# Patient Record
Sex: Male | Born: 1955 | Race: Black or African American | Hispanic: No | Marital: Married | Smoking: Never smoker
Health system: Southern US, Community
[De-identification: ages and names within clinical notes are randomized; demographics above are authoritative.]

## PROBLEM LIST (undated history)

## (undated) DIAGNOSIS — I714 Abdominal aortic aneurysm, without rupture, unspecified: Secondary | ICD-10-CM

## (undated) DIAGNOSIS — A419 Sepsis, unspecified organism: Secondary | ICD-10-CM

## (undated) DIAGNOSIS — Z9229 Personal history of other drug therapy: Secondary | ICD-10-CM

## (undated) DIAGNOSIS — I639 Cerebral infarction, unspecified: Secondary | ICD-10-CM

## (undated) DIAGNOSIS — I10 Essential (primary) hypertension: Secondary | ICD-10-CM

## (undated) DIAGNOSIS — E785 Hyperlipidemia, unspecified: Secondary | ICD-10-CM

## (undated) DIAGNOSIS — I6529 Occlusion and stenosis of unspecified carotid artery: Secondary | ICD-10-CM

## (undated) DIAGNOSIS — H10029 Other mucopurulent conjunctivitis, unspecified eye: Secondary | ICD-10-CM

## (undated) DIAGNOSIS — K56609 Unspecified intestinal obstruction, unspecified as to partial versus complete obstruction: Secondary | ICD-10-CM

## (undated) DIAGNOSIS — E039 Hypothyroidism, unspecified: Secondary | ICD-10-CM

## (undated) DIAGNOSIS — K4090 Unilateral inguinal hernia, without obstruction or gangrene, not specified as recurrent: Secondary | ICD-10-CM

## (undated) DIAGNOSIS — I63512 Cerebral infarction due to unspecified occlusion or stenosis of left middle cerebral artery: Secondary | ICD-10-CM

## (undated) DIAGNOSIS — N189 Chronic kidney disease, unspecified: Secondary | ICD-10-CM

## (undated) HISTORY — DX: Essential (primary) hypertension: I10

## (undated) HISTORY — DX: Unilateral inguinal hernia, without obstruction or gangrene, not specified as recurrent: K40.90

## (undated) HISTORY — DX: Hyperlipidemia, unspecified: E78.5

## (undated) HISTORY — PX: KNEE ARTHROSCOPY: SUR90

## (undated) HISTORY — DX: Hypothyroidism, unspecified: E03.9

## (undated) HISTORY — DX: Cerebral infarction, unspecified: I63.9

---
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
## 2013-12-24 ENCOUNTER — Ambulatory Visit: Payer: Self-pay | Admitting: Neurology

## 2013-12-24 ENCOUNTER — Inpatient Hospital Stay: Payer: Self-pay | Admitting: Specialist

## 2013-12-24 DIAGNOSIS — I639 Cerebral infarction, unspecified: Secondary | ICD-10-CM

## 2013-12-24 HISTORY — DX: Cerebral infarction, unspecified: I63.9

## 2013-12-24 LAB — CBC WITH DIFFERENTIAL/PLATELET
Basophil #: 0.1 10*3/uL (ref 0.0–0.1)
Basophil %: 1 %
Eosinophil #: 0.2 10*3/uL (ref 0.0–0.7)
Eosinophil %: 2.8 %
HCT: 46.9 % (ref 40.0–52.0)
HGB: 15.3 g/dL (ref 13.0–18.0)
LYMPHS ABS: 2 10*3/uL (ref 1.0–3.6)
Lymphocyte %: 32.4 %
MCH: 28.1 pg (ref 26.0–34.0)
MCHC: 32.7 g/dL (ref 32.0–36.0)
MCV: 86 fL (ref 80–100)
MONOS PCT: 10.9 %
Monocyte #: 0.7 x10 3/mm (ref 0.2–1.0)
NEUTROS PCT: 52.9 %
Neutrophil #: 3.3 10*3/uL (ref 1.4–6.5)
PLATELETS: 258 10*3/uL (ref 150–440)
RBC: 5.47 10*6/uL (ref 4.40–5.90)
RDW: 13.7 % (ref 11.5–14.5)
WBC: 6.2 10*3/uL (ref 3.8–10.6)

## 2013-12-24 LAB — COMPREHENSIVE METABOLIC PANEL
AST: 33 U/L (ref 15–37)
Albumin: 4.1 g/dL (ref 3.4–5.0)
Alkaline Phosphatase: 77 U/L
Anion Gap: 8 (ref 7–16)
BILIRUBIN TOTAL: 0.5 mg/dL (ref 0.2–1.0)
BUN: 17 mg/dL (ref 7–18)
CALCIUM: 8.9 mg/dL (ref 8.5–10.1)
Chloride: 104 mmol/L (ref 98–107)
Co2: 29 mmol/L (ref 21–32)
Creatinine: 1.25 mg/dL (ref 0.60–1.30)
EGFR (African American): >60
EGFR (Non-African Amer.): >60
Glucose: 127 mg/dL — ABNORMAL HIGH (ref 65–99)
Osmolality: 284 (ref 275–301)
POTASSIUM: 3.2 mmol/L — AB (ref 3.5–5.1)
SGPT (ALT): 39 U/L
SODIUM: 141 mmol/L (ref 136–145)
Total Protein: 8.2 g/dL (ref 6.4–8.2)

## 2013-12-24 LAB — APTT: Activated PTT: 32.7 secs (ref 23.6–35.9)

## 2013-12-24 LAB — PROTIME-INR
INR: 1
Prothrombin Time: 13.5 secs (ref 11.5–14.7)

## 2013-12-24 LAB — TROPONIN I: Troponin-I: <0.02 ng/mL

## 2013-12-25 LAB — BASIC METABOLIC PANEL WITH GFR
Anion Gap: 10
BUN: 14 mg/dL
Calcium, Total: 8.6 mg/dL
Chloride: 103 mmol/L
Co2: 26 mmol/L
Creatinine: 1.16 mg/dL
EGFR (African American): >60
EGFR (Non-African Amer.): >60
Glucose: 114 mg/dL — ABNORMAL HIGH
Osmolality: 279
Potassium: 3.3 mmol/L — ABNORMAL LOW
Sodium: 139 mmol/L

## 2013-12-25 LAB — LIPID PANEL
Cholesterol: 208 mg/dL — ABNORMAL HIGH (ref 0–200)
HDL Cholesterol: 49 mg/dL (ref 40–60)
Ldl Cholesterol, Calc: 147 mg/dL — ABNORMAL HIGH (ref 0–100)
Triglycerides: 59 mg/dL (ref 0–200)
VLDL Cholesterol, Calc: 12 mg/dL (ref 5–40)

## 2013-12-25 LAB — URINALYSIS, COMPLETE
Bacteria: NONE SEEN
Bilirubin,UR: NEGATIVE
Blood: NEGATIVE
Glucose,UR: NEGATIVE mg/dL
Ketone: NEGATIVE
Leukocyte Esterase: NEGATIVE
Nitrite: NEGATIVE
Ph: 5
Protein: NEGATIVE
RBC,UR: NONE SEEN /HPF
Specific Gravity: 1.013
Squamous Epithelial: <1
WBC UR: 2 /HPF

## 2013-12-25 LAB — DRUG SCREEN, URINE
Amphetamines, Ur Screen: NEGATIVE
Barbiturates, Ur Screen: NEGATIVE
Benzodiazepine, Ur Scrn: NEGATIVE
Cannabinoid 50 Ng, Ur ~~LOC~~: NEGATIVE
Cocaine Metabolite,Ur ~~LOC~~: NEGATIVE
MDMA (Ecstasy)Ur Screen: NEGATIVE
Methadone, Ur Screen: NEGATIVE
Opiate, Ur Screen: NEGATIVE
Phencyclidine (PCP) Ur S: NEGATIVE
Tricyclic, Ur Screen: NEGATIVE

## 2013-12-25 LAB — TSH: Thyroid Stimulating Horm: 2.54 u[IU]/mL

## 2013-12-25 LAB — HEMOGLOBIN A1C: Hemoglobin A1C: 6.1 % (ref 4.2–6.3)

## 2013-12-26 ENCOUNTER — Encounter: Payer: Self-pay | Admitting: *Deleted

## 2013-12-26 LAB — BASIC METABOLIC PANEL
Anion Gap: 5 — ABNORMAL LOW (ref 7–16)
BUN: 14 mg/dL (ref 7–18)
CHLORIDE: 104 mmol/L (ref 98–107)
CO2: 29 mmol/L (ref 21–32)
Calcium, Total: 8.8 mg/dL (ref 8.5–10.1)
Creatinine: 1.2 mg/dL (ref 0.60–1.30)
EGFR (African American): >60
EGFR (Non-African Amer.): >60
GLUCOSE: 139 mg/dL — AB (ref 65–99)
Osmolality: 278 (ref 275–301)
Potassium: 4.1 mmol/L (ref 3.5–5.1)
SODIUM: 138 mmol/L (ref 136–145)

## 2013-12-26 NOTE — PMR Pre-admission (Signed)
[facility] PMR Admission Coordinator Pre-Admission Assessment  Patient: [name] is an 58 y.o., male MRN: [id number] DOB: 1955/07/27 Height: 5\' 11"  (180.3 cm) Weight: 95.255 kg (210 lb)  Insurance Information HMO:     PPO:      PCP:      IPA:      80/20:      OTHER:  PRIMARY: [employer] [place]      Policy#: [id number]      Subscriber: self  CM Name: Newman Pies      Phone#: [phone]      Fax#: [phone] Received authorization from [place] for seven days from 12-27-13 through 01-02-14 with updates due on 01-01-14 to above fax. Pre-Cert#: [id number]      Employer: full time Benefits:  Phone #: [phone]     Name: Janann Colonel. Date: 05-24-13     Deduct: $1000 (none met)      Out of Pocket Max: $5000 (none met)      Life Max: none CIR: 80/20% pre-auth needed      SNF: 80/20% pre-auth needed (100 day visit limit, combined between CIR and SNF) Outpatient: 80%     Co-Pay: 20% (visit limits combined with OP and [address] disciplines, see below) Home Health: 80%      Co-Pay: 20% pre-auth needed for nsg services, not therapy. 60 visits/year limit for nsg care. 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 is a 58 year old male who was admitted to [facility] on 08/03/015 with two day history of fluctuating RUE weakness progressive to difficulty speaking and some swallowing issues. He reported being under a lot of stress, working 16 days straight and family forced  him to seek medical assistance. UDS negative. TSH- 2.54. Hgb A1c- 601. CT head with recent left lenticulostriate infarct.  He  was started on ASA for treatment and blood pressure noted to be labile and treated with hydralazine. He had worsening of symptoms with dense right hemiparesis that evening and follow up CCT with left basal ganglia infarct and no significant change. Neurology (Dr. Irish Elders) consulted for input and felt that patient with worsening of symptoms likely due to hypotension. MRI brain with acute nonhemorrhagic infarct left lenticular nucleus and mid corona radiata and tiny infarct anterior left frontal opercular region.  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  Carotid dopplers done revealing "minimal to moderate amount of bilateral arthrosclerotic plaque R>L not resulting in hemodynamically significant stenosis." 2D echo  With EF 60-65%, moderate LVH, impaired LV relaxation and no cardiac SOE.   He continues on ASA for secondary stroke prevention of thrombotic CVA. T Patient with subsequent right facial droop with mild dysarthria and  right hemiparesis. Therapy initiated and patient limited by right hemiparesis with impaired balance but very motivated. CIR recommended by MD/rehab team and patient admitted today.  Patient's medical record from [facility] has been reviewed by the rehabilitation admission coordinator and physician.  Past Medical History  No past medical history on file. Pt did have L knee arthroscopic  surgery in the past.  Family History  family history is not on file.  Prior Rehab/Hospitalizations: none   Current Medications See MAR  Patients Current Diet:  Mechanical soft/regular foods with chopped meats (pt does wear dentures). Aspiration precautions. Low fat/low sodium diet.  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 Pt also  exercises regularly, biking 20 miles 3 x/week.  Home Assistive Devices / Equipment Home Assistive Devices/Equipment: None   Prior Functional Level Current Functional Level  Bed Mobility  Independent  Min assist (cues to hook R LE with L LE)   Transfers  Independent  Mod assist (Moderate assist x 2)   Mobility - Walk/Wheelchair  Independent  Other (Mod assist x 2 for 15' interval with R knee blocked & initia)   Upper Body Dressing  Independent  Other (not assessed, anticipate needs due to flaccid R UE)   Lower Body Dressing  Independent  Mod assist   Grooming  Independent  Other (flaccid R UE, not assessed but anticipate needs)   Eating/Drinking  Independent  Other (pt is using his L UE, but R UE is dominant and flaccid)   Toilet Transfer  Independent  Mod assist (mod assist x 2 to [facility])   Bladder Continence   [facility]  [facility] using urinal   Bowel Management  WFL  none documented since admit on 8-3   Stair Climbing   Independent  Other (not assessed at this time)   Communication  Independent  some dysarthria noted but no auditory, comprehension or cog. deficits noted per SLP   Memory  [facility]  appears [facility]   Cooking/Meal Prep  Independent      Housework  Independent    Money Management  Independent    Driving   Independent     Special needs/care consideration BiPAP/CPAP no CPM no  Continuous Drip IV no  Dialysis no         Life Vest no  Oxygen no  Special Bed no  Trach Size no  Wound Vac (area) no      Skin no issues                               Bowel mgmt: last BM on 12-23-13 Bladder mgmt: currently using the urinal Diabetic mgmt no  Previous Home Environment Living Arrangements: Spouse/significant other  Lives With: Spouse Available Help at Discharge: Family Type of Home: House Home Layout: One level Home Access: Stairs to enter Entrance Stairs-Rails: None Entrance Stairs-Number of Steps: 3 Additional Comments: wife is currently working  but states she will take FMLA to be with pt.  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 Wife will have to return to work at some point. Caregiver Availability: 24/7 Discharge Plan Discussed  with Primary Caregiver: Yes (discussed with pt and wife on 8-4 and 8-5 and on 8-6) Is Caregiver In Agreement with Plan?: Yes Does Caregiver/Family have Issues with Lodging/Transportation while Pt is in Rehab?: No  Goals/Additional Needs Patient/Family Goal for Rehab: Supervision and Min assist with PT/OT and supervision with SLP Expected length of stay: 14-18 days Cultural Considerations: none Dietary Needs: mechanical soft/regular diet with aspiration precautions, Low fat, 2 gms sodium, chopped meats per pt request Equipment Needs: to be determined Pt/Family Agrees to Admission and willing to participate: Yes Program Orientation Provided & Reviewed with Pt/Caregiver Including Roles  & Responsibilities: Yes  Patient Condition: I met with pt and his wife at [facility] on 12-25-13 and 12-27-13 to discuss the possibility of inpatient rehab. This 58 year old patient was previously independent in all aspects of life, working two jobs and exercised regularly prior to this L basal ganglia CVA. Pt is currently requiring moderate assist of 2 for transfers and limited gait of 15'  intervals. In addition, he is needing moderate assist with self care skills. His speech is dysarthric and is diet is currently modified following this CVA. He will greatly benefit from the multi-disciplinary team of skilled PT, OT, SLP and rehab nursing to maximize his functional return following this new CVA. PT, OT and rehab nursing will focus on increasing strength for greater independence in bed mobility, transfers, ambulation and self care skills. Rehab nursing can also give pt/family the needed teaching on medication management and any bowel/bladder issues. In addition, pt will benefit from rehab physician intervention for close monitoring of his hypertension. Discussed case with rehab MD and PA who stated that pt is a great inpatient rehab candidate and received medical clearance from Dr. Volanda Napoleon at [facility]. Pt and his wife are motivated to come to inpatient rehab and will benefit from the intensive services of skilled therapy under rehab physician guidance. Pt will be admitted today on 12-27-13.   Preadmission Screen Completed By:  Nanetta Batty PT 12/27/2013 1056 am ______________________________________________________________________   Discussed status with Dr. Letta Pate on 12-27-13 at 1056 and received telephone approval for admission today.  Admission Coordinator:  Nanetta Batty, PT time 1056/Date 12-27-13   Assessment/Plan: Diagnosis: Left putamen and corona radiata CVA 1. Does the need for close, 24 hr/day  Medical supervision in concert with the patient's rehab needs make it unreasonable for this patient to be served in a less intensive setting? Yes 2. Co-Morbidities requiring supervision/potential complications: post-stroke sequelae, htn 3. Due to bladder management, bowel management, safety, skin/wound care, disease management, medication administration and patient education, does the patient require 24 hr/day rehab nursing? Yes 4. Does the patient require coordinated care of a physician,  rehab nurse, PT (1-2 hrs/day, 5 days/week), OT (1-2 hrs/day, 5 days/week) and SLP (1-2 hrs/day, 5 days/week) to address physical and functional deficits in the context of the above medical diagnosis(es)? Yes Addressing deficits in the following areas: balance, endurance, locomotion, strength, transferring, bowel/bladder control, bathing, dressing, feeding, grooming, toileting, speech and psychosocial support 5. Can the patient actively participate in an intensive therapy program of at least 3 hrs of therapy 5 days a week? Yes 6. The potential for patient to make measurable gains while on inpatient rehab is excellent 7. Anticipated functional outcomes upon discharge from inpatients are: supervision and min assist PT, supervision and min assist OT, supervision to mod ISLP 8. Estimated rehab length of stay to reach the above functional goals is: 14-18 days 9. Does the patient  have adequate social supports to accommodate these discharge functional goals? Yes 10. Anticipated D/C setting: Home 11. Anticipated post D/C treatments: HH therapy and Outpatient therapy 12. Overall Rehab/Functional Prognosis: excellent    RECOMMENDATIONS: This patient's condition is appropriate for continued rehabilitative care in the following setting: CIR Patient has agreed to participate in recommended program. Yes Note that insurance prior authorization may be required for reimbursement for recommended care.  Comment: Admitting patient to CIR today.  Meredith Staggers, MD, [place], [place] 12/27/2013

## 2013-12-27 ENCOUNTER — Inpatient Hospital Stay (HOSPITAL_COMMUNITY)
Admission: RE | Admit: 2013-12-27 | Discharge: 2014-01-17 | DRG: 945 | Disposition: A | Discharge status: Home / Self Care | Payer: BC Managed Care – PPO | Source: Intra-hospital | Attending: Physical Medicine & Rehabilitation | Admitting: Physical Medicine & Rehabilitation

## 2013-12-27 ENCOUNTER — Encounter: Payer: Self-pay | Admitting: Physical Medicine and Rehabilitation

## 2013-12-27 ENCOUNTER — Other Ambulatory Visit: Payer: Self-pay | Admitting: Physical Medicine and Rehabilitation

## 2013-12-27 DIAGNOSIS — G8190 Hemiplegia, unspecified affecting unspecified side: Secondary | ICD-10-CM | POA: Diagnosis present

## 2013-12-27 DIAGNOSIS — R29810 Facial weakness: Secondary | ICD-10-CM | POA: Diagnosis present

## 2013-12-27 DIAGNOSIS — I6330 Cerebral infarction due to thrombosis of unspecified cerebral artery: Secondary | ICD-10-CM | POA: Diagnosis present

## 2013-12-27 DIAGNOSIS — I10 Essential (primary) hypertension: Secondary | ICD-10-CM | POA: Diagnosis present

## 2013-12-27 DIAGNOSIS — Z5189 Encounter for other specified aftercare: Secondary | ICD-10-CM | POA: Diagnosis present

## 2013-12-27 DIAGNOSIS — I63512 Cerebral infarction due to unspecified occlusion or stenosis of left middle cerebral artery: Secondary | ICD-10-CM | POA: Diagnosis present

## 2013-12-27 DIAGNOSIS — R609 Edema, unspecified: Secondary | ICD-10-CM

## 2013-12-27 DIAGNOSIS — Z8249 Family history of ischemic heart disease and other diseases of the circulatory system: Secondary | ICD-10-CM | POA: Diagnosis not present

## 2013-12-27 DIAGNOSIS — I63319 Cerebral infarction due to thrombosis of unspecified middle cerebral artery: Secondary | ICD-10-CM

## 2013-12-27 DIAGNOSIS — E785 Hyperlipidemia, unspecified: Secondary | ICD-10-CM

## 2013-12-27 DIAGNOSIS — R471 Dysarthria and anarthria: Secondary | ICD-10-CM | POA: Diagnosis present

## 2013-12-27 LAB — BASIC METABOLIC PANEL
ANION GAP: 9 (ref 7–16)
BUN: 16 mg/dL (ref 7–18)
CHLORIDE: 101 mmol/L (ref 98–107)
Calcium, Total: 8.3 mg/dL — ABNORMAL LOW (ref 8.5–10.1)
Co2: 28 mmol/L (ref 21–32)
Creatinine: 1.11 mg/dL (ref 0.60–1.30)
EGFR (Non-African Amer.): >60
Glucose: 109 mg/dL — ABNORMAL HIGH (ref 65–99)
Osmolality: 277 (ref 275–301)
POTASSIUM: 3.7 mmol/L (ref 3.5–5.1)
Sodium: 138 mmol/L (ref 136–145)

## 2013-12-27 MED ORDER — DIPHENHYDRAMINE HCL 12.5 MG/5ML PO ELIX: 12.5000 mg | ORAL_SOLUTION | Freq: Four times a day (QID) | ORAL | Status: DC | PRN

## 2013-12-27 MED ORDER — TRAZODONE HCL 50 MG PO TABS
25.0000 mg | ORAL_TABLET | Freq: Every evening | ORAL | Status: DC | PRN
  Administered 2014-01-09 – 2014-01-13 (×2): 50 mg via ORAL
  Filled 2013-12-27 (×2): qty 1

## 2013-12-27 MED ORDER — ACETAMINOPHEN 325 MG PO TABS
325.0000 mg | ORAL_TABLET | ORAL | Status: DC | PRN
  Administered 2013-12-29 – 2014-01-01 (×2): 650 mg via ORAL
  Administered 2014-01-02: 325 mg via ORAL
  Filled 2013-12-27 (×3): qty 2

## 2013-12-27 MED ORDER — METHOCARBAMOL 500 MG PO TABS
500.0000 mg | ORAL_TABLET | Freq: Four times a day (QID) | ORAL | Status: DC | PRN
  Administered 2013-12-30 – 2014-01-09 (×7): 500 mg via ORAL
  Filled 2013-12-27 (×8): qty 1

## 2013-12-27 MED ORDER — ALUM & MAG HYDROXIDE-SIMETH 200-200-20 MG/5ML PO SUSP: 30.0000 mL | ORAL | Status: DC | PRN

## 2013-12-27 MED ORDER — PROCHLORPERAZINE 25 MG RE SUPP
12.5000 mg | Freq: Four times a day (QID) | RECTAL | Status: DC | PRN
  Filled 2013-12-27: qty 1

## 2013-12-27 MED ORDER — FLEET ENEMA 7-19 GM/118ML RE ENEM: 1.0000 | ENEMA | Freq: Once | RECTAL | Status: AC | PRN

## 2013-12-27 MED ORDER — POLYETHYLENE GLYCOL 3350 17 G PO PACK
17.0000 g | PACK | Freq: Every day | ORAL | Status: DC | PRN
  Administered 2013-12-27 – 2014-01-10 (×5): 17 g via ORAL
  Filled 2013-12-27 (×5): qty 1

## 2013-12-27 MED ORDER — PROCHLORPERAZINE MALEATE 5 MG PO TABS
5.0000 mg | ORAL_TABLET | Freq: Four times a day (QID) | ORAL | Status: DC | PRN
  Filled 2013-12-27: qty 2

## 2013-12-27 MED ORDER — BISACODYL 10 MG RE SUPP: 10.0000 mg | Freq: Every day | RECTAL | Status: DC | PRN

## 2013-12-27 MED ORDER — PROCHLORPERAZINE EDISYLATE 5 MG/ML IJ SOLN
5.0000 mg | Freq: Four times a day (QID) | INTRAMUSCULAR | Status: DC | PRN
  Filled 2013-12-27: qty 2

## 2013-12-27 MED ORDER — ENOXAPARIN SODIUM 40 MG/0.4ML ~~LOC~~ SOLN
40.0000 mg | SUBCUTANEOUS | Status: DC
  Administered 2013-12-27 – 2014-01-16 (×21): 40 mg via SUBCUTANEOUS
  Filled 2013-12-27 (×22): qty 0.4

## 2013-12-27 MED ORDER — SIMVASTATIN 20 MG PO TABS
20.0000 mg | ORAL_TABLET | Freq: Every day | ORAL | Status: DC
  Administered 2013-12-27 – 2014-01-16 (×20): 20 mg via ORAL
  Filled 2013-12-27 (×22): qty 1

## 2013-12-27 MED ORDER — ASPIRIN EC 325 MG PO TBEC
325.0000 mg | DELAYED_RELEASE_TABLET | Freq: Every day | ORAL | Status: DC
  Administered 2013-12-28 – 2014-01-17 (×21): 325 mg via ORAL
  Filled 2013-12-27 (×22): qty 1

## 2013-12-27 MED ORDER — GUAIFENESIN-DM 100-10 MG/5ML PO SYRP: 5.0000 mL | ORAL_SOLUTION | Freq: Four times a day (QID) | ORAL | Status: DC | PRN

## 2013-12-27 NOTE — Progress Notes (Signed)
VASCULAR LAB PRELIMINARY  PRELIMINARY  PRELIMINARY  PRELIMINARY  Bilateral lower extremity venous Dopplers completed.    Preliminary report:  There is no DVT or SVT noted in the bilateral lower extremities.   Kale Dols, RVT 12/27/2013, 4:16 PM

## 2013-12-28 ENCOUNTER — Inpatient Hospital Stay (HOSPITAL_COMMUNITY): Payer: BC Managed Care – PPO | Admitting: Occupational Therapy

## 2013-12-28 ENCOUNTER — Inpatient Hospital Stay (HOSPITAL_COMMUNITY): Payer: Self-pay | Admitting: Speech Pathology

## 2013-12-28 ENCOUNTER — Inpatient Hospital Stay (HOSPITAL_COMMUNITY): Payer: BC Managed Care – PPO | Admitting: Physical Therapy

## 2013-12-28 DIAGNOSIS — I6330 Cerebral infarction due to thrombosis of unspecified cerebral artery: Secondary | ICD-10-CM

## 2013-12-28 DIAGNOSIS — I10 Essential (primary) hypertension: Secondary | ICD-10-CM

## 2013-12-28 LAB — COMPREHENSIVE METABOLIC PANEL
ALBUMIN: 3.6 g/dL (ref 3.5–5.2)
ALK PHOS: 75 U/L (ref 39–117)
ALT: 27 U/L (ref 0–53)
AST: 22 U/L (ref 0–37)
Anion gap: 13 (ref 5–15)
BILIRUBIN TOTAL: 0.4 mg/dL (ref 0.3–1.2)
BUN: 16 mg/dL (ref 6–23)
CO2: 28 meq/L (ref 19–32)
Calcium: 9.1 mg/dL (ref 8.4–10.5)
Chloride: 100 mEq/L (ref 96–112)
Creatinine, Ser: 1.04 mg/dL (ref 0.50–1.35)
GFR calc Af Amer: 89 mL/min — ABNORMAL LOW (ref >90)
GFR, EST NON AFRICAN AMERICAN: 77 mL/min — AB (ref >90)
Glucose, Bld: 122 mg/dL — ABNORMAL HIGH (ref 70–99)
POTASSIUM: 4.5 meq/L (ref 3.7–5.3)
SODIUM: 141 meq/L (ref 137–147)
Total Protein: 7.5 g/dL (ref 6.0–8.3)

## 2013-12-28 LAB — CBC WITH DIFFERENTIAL/PLATELET
BASOS ABS: 0 10*3/uL (ref 0.0–0.1)
Basophils Relative: 1 % (ref 0–1)
EOS PCT: 2 % (ref 0–5)
Eosinophils Absolute: 0.1 10*3/uL (ref 0.0–0.7)
HCT: 44.3 % (ref 39.0–52.0)
Hemoglobin: 14.9 g/dL (ref 13.0–17.0)
Lymphocytes Relative: 25 % (ref 12–46)
Lymphs Abs: 1.4 10*3/uL (ref 0.7–4.0)
MCH: 28.4 pg (ref 26.0–34.0)
MCHC: 33.6 g/dL (ref 30.0–36.0)
MCV: 84.5 fL (ref 78.0–100.0)
MONO ABS: 0.4 10*3/uL (ref 0.1–1.0)
Monocytes Relative: 7 % (ref 3–12)
Neutro Abs: 3.6 10*3/uL (ref 1.7–7.7)
Neutrophils Relative %: 65 % (ref 43–77)
PLATELETS: 257 10*3/uL (ref 150–400)
RBC: 5.24 MIL/uL (ref 4.22–5.81)
RDW: 13.2 % (ref 11.5–15.5)
WBC: 5.5 10*3/uL (ref 4.0–10.5)

## 2013-12-28 NOTE — IPOC Note (Signed)
Overall Plan of Care [facility](IPOC) Patient Details Name: [name] MRN: [id number] DOB: 04/09/1956  Admitting Diagnosis: CVA  Hospital Problems: Active Problems:   CVA (cerebral infarction)     Functional Problem List: Nursing Safety;Sensory;Edema;Endurance;Medication Management;Motor;Bowel  PT Balance;Endurance;Motor;Pain;Perception;Safety;Sensory;Skin Integrity  OT Balance;Endurance;Motor;Safety;Other (Comment) (self care)  SLP Linguistic  TR         Basic ADL's: OT Grooming;Bathing;Dressing;Toileting     Advanced  ADL's: OT       Transfers: PT Bed Mobility;Bed to Chair;Car;Furniture  OT Toilet;Tub/Shower     Locomotion: PT Ambulation;Wheelchair Mobility;Stairs     Additional Impairments: OT Fuctional Use of Upper Extremity  SLP Communication expression    TR      Anticipated Outcomes Item Anticipated Outcome  Self Feeding    Swallowing      Basic self-care  min A  Toileting  Min A   Bathroom Transfers Min A  Bowel/Bladder  manage bladder with level 5 assist and bowel with Mod I  Transfers     Locomotion     Communication  Mod I   Cognition     Pain  n/a  Safety/Judgment  Maintain safety with supervision/cues   Therapy Plan: PT Intensity: Minimum of 1-2 x/day ,45 to 90 minutes PT Frequency: 5 out of 7 days OT Intensity: Minimum of 1-2 x/day, 45 to 90 minutes OT Frequency: 5 out of 7 days OT Duration/Estimated Length of Stay: 3 weeks SLP Intensity: Minumum of 1-2 x/day, 30 to 90 minutes SLP Frequency: 3 out of 7 days SLP Duration/Estimated Length of Stay: 18 days       Team Interventions: Nursing Interventions Patient/Family Education;Medication Management;Discharge Planning;Bowel Management;Psychosocial Support;Disease Management/Prevention  PT interventions Ambulation/gait training;Balance/vestibular training;Discharge planning;Disease management/prevention;DME/adaptive equipment instruction;Functional mobility training;Functional  electrical stimulation;Neuromuscular re-education;Patient/family education;Psychosocial support;Skin care/wound management;Stair training;Therapeutic Activities;Therapeutic Exercise;UE/LE Strength taining/ROM;UE/LE Coordination activities;Wheelchair propulsion/positioning  OT Interventions Balance/vestibular training;Discharge planning;DME/adaptive equipment instruction;Psychosocial support;Therapeutic Activities;UE/LE Strength taining/ROM;UE/LE Coordination activities;Therapeutic Exercise;Self Care/advanced ADL retraining;Patient/family education;Neuromuscular re-education  SLP Interventions Cueing hierarchy;Environmental controls;Functional tasks;Internal/external aids;Oral motor exercises;Patient/family education;Speech/Language facilitation  TR Interventions    SW/CM Interventions Discharge Planning;Psychosocial Support;Patient/Family Education    Team Discharge Planning: Destination: PT-Home ,OT- Home , SLP-Home Projected Follow-up: PT- , OT-  Home health OT (pt. request), SLP-Other (comment) (TBD) Projected Equipment Needs: PT- , OT- Tub/shower bench;3 in 1 bedside comode, SLP-None recommended by SLP Equipment Details: PT- , OT-Pt reports owning no equipment Patient/family involved in discharge planning: PT- Patient,  OT-Patient, SLP-Patient  MD ELOS: 3 weeks Medical Rehab Prognosis:  Excellent Assessment: The patient has been admitted for CIR therapies with the diagnosis of left CVA. The team will be addressing functional mobility, strength, stamina, balance, safety, adaptive techniques and equipment, self-care, bowel and bladder mgt, patient and caregiver education, speech, NMR, orthotics, coping skills, community reintegration. Goals have been set at min assist for mobility, self-care, and communication.    Thomas OysterZachary T. Swartz, MD, FAAPMR      See Team Conference Notes for weekly updates to the plan of care

## 2013-12-28 NOTE — Progress Notes (Signed)
[facility] Admission Coordinator Signed Physical Medicine and Rehabilitation PMR Pre-admission Service date: 12/26/2013 4:37 PM  Related encounter: Documentation from 12/26/2013 in [place] PMR Admission Coordinator Pre-Admission Assessment   Patient: [name] is an 58 y.o., male MRN: [id number] DOB: 23-Oct-1955 Height: 5' 11" (180.3 cm) Weight: 95.255 kg (210 lb)   Insurance Information HMO:     PPO:      PCP:      IPA:      80/20:      OTHER:   PRIMARY: [employer] [place]      Policy#: [id number]      Subscriber: self   CM Name: Newman Pies      Phone#: [phone]         Fax#: [phone] Received authorization from [place] for seven days from 12-27-13 through 01-02-14 with updates due on 01-01-14 to above fax. Pre-Cert#: [id number]      Employer: full time Benefits:  Phone #: [phone]     Name: Janann Colonel. Date: 05-24-13     Deduct: $1000 (none met)      Out of Pocket Max: $5000 (none met)      Life Max: none CIR: 80/20% pre-auth needed      SNF: 80/20% pre-auth needed (100 day visit limit, combined between CIR and SNF) Outpatient: 80%     Co-Pay: 20% (visit limits combined with OP and [place] disciplines, see below) Home Health: 80%      Co-Pay: 20% pre-auth needed for nsg services, not therapy. 60 visits/year limit for nsg care. 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 is a 58 year old male who was admitted to [facility] on 08/03/015 with two day history of fluctuating RUE  weakness progressive to difficulty speaking and some swallowing issues. He reported being under a lot of stress, working 16 days straight and family forced  him to seek medical assistance. UDS negative. TSH- 2.54. Hgb A1c- 601. CT head with recent left lenticulostriate infarct.  He was started on ASA for treatment and blood pressure noted to be labile and treated with hydralazine. He had worsening of symptoms with dense right hemiparesis that evening and follow up CCT with left basal ganglia infarct and no significant change. Neurology (Dr. Irish Elders) consulted for input and felt that patient with worsening of symptoms likely due to hypotension. MRI brain with acute nonhemorrhagic infarct left lenticular nucleus and mid corona radiata and tiny infarct anterior left frontal opercular region.  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  Carotid dopplers done revealing "minimal to moderate amount of bilateral arthrosclerotic plaque R>L not resulting in hemodynamically significant stenosis." 2D echo  With EF 60-65%, moderate LVH, impaired LV relaxation and no cardiac SOE.    He continues on ASA for secondary stroke prevention of thrombotic CVA. T  Patient with subsequent right facial droop with mild dysarthria and  right hemiparesis. Therapy initiated and patient limited by right hemiparesis with impaired balance but very motivated. CIR recommended by MD/rehab team and patient admitted today.   Patient's medical record from [facility] has been reviewed by the rehabilitation admission coordinator and physician.   Past Medical History  No past medical history on file. Pt did have L knee arthroscopic surgery in the past.   Family History  family history is not on file.   Prior Rehab/Hospitalizations: none               Current Medications See MAR   Patients Current Diet:  Mechanical soft/regular foods with chopped meats (pt does wear dentures). Aspiration  precautions. Low fat/low sodium diet.   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 Pt also exercises regularly, biking 20 miles 3 x/week.   Home Assistive Devices / Equipment Home Assistive Devices/Equipment: None      Prior Functional Level  Current Functional Level   Bed Mobility   Independent   Min assist (cues to hook R LE with L LE)    Transfers   Independent   Mod assist (Moderate assist x 2)    Mobility - Walk/Wheelchair   Independent   Other (Mod assist x 2 for 15' interval with R knee blocked & initia)    Upper Body Dressing   Independent   Other (not assessed, anticipate needs due to flaccid R UE)    Lower Body Dressing   Independent   Mod assist    Grooming   Independent   Other (flaccid R UE, not assessed but anticipate needs)    Eating/Drinking   Independent   Other (pt is using his L UE, but R UE is dominant and flaccid)    Toilet Transfer   Independent   Mod assist (mod assist x 2 to [facility])    Bladder Continence     [facility]   [facility] using urinal    Bowel Management   WFL   none documented since admit on 8-3    Stair Climbing   Independent   Other (not assessed at this time)    Communication   Independent   some dysarthria noted but no auditory, comprehension or cog. deficits noted per SLP    Memory   [facility]   appears [facility]    Cooking/Meal Prep   Independent        Housework   Independent      Money Management   Independent      Driving   Independent         Special needs/care consideration BiPAP/CPAP no CPM no   Continuous Drip IV no   Dialysis no          Life Vest no   Oxygen no   Special Bed no   Trach Size no   Wound Vac (area) no       Skin no issues                                Bowel mgmt: last BM on  12-23-13 Bladder mgmt: currently using the urinal Diabetic mgmt no   Previous Home Environment Living Arrangements: Spouse/significant other  Lives With: Spouse Available Help at Discharge: Family Type of Home: House Home Layout: One level Home Access: Stairs to enter Entrance Stairs-Rails: None Entrance Stairs-Number of Steps: 3 Additional Comments: wife is currently working but states she will take FMLA to be with pt.   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 Wife will have to return to work at some point. Caregiver Availability: 24/7 Discharge Plan Discussed with Primary Caregiver: Yes (discussed with pt and wife on 8-4 and 8-5 and on 8-6) Is Caregiver In Agreement with Plan?: Yes Does Caregiver/Family have Issues with Lodging/Transportation while Pt is in Rehab?: No   Goals/Additional Needs Patient/Family Goal for Rehab: Supervision and Min assist with PT/OT and supervision with SLP Expected length of stay: 14-18 days Cultural Considerations: none Dietary Needs: mechanical soft/regular diet with aspiration precautions, Low fat, 2 gms sodium, chopped meats per pt request Equipment Needs: to be determined Pt/Family Agrees to Admission and willing to participate: Yes Program Orientation Provided & Reviewed with Pt/Caregiver Including Roles  &  Responsibilities: Yes   Patient Condition: I met with pt and his wife at [facility] on 12-25-13 and 12-27-13 to discuss the possibility of inpatient rehab. This 58 year old patient was previously independent in all aspects of life, working two jobs and exercised regularly prior to this L basal ganglia CVA. Pt is currently requiring moderate assist of 2 for transfers and limited gait of 15' intervals. In addition, he is needing moderate assist with self care skills. His speech is dysarthric and is diet is currently modified following this CVA. He will greatly benefit from the multi-disciplinary team of skilled PT, OT, SLP and rehab nursing to maximize his functional return following this new CVA. PT, OT and rehab nursing will focus on increasing strength for greater independence in bed mobility, transfers, ambulation and self care skills. Rehab nursing can also give pt/family the needed teaching on medication management and any bowel/bladder issues. In addition, pt will benefit from rehab physician intervention for close monitoring of his hypertension. Discussed case with rehab MD and PA who stated that pt is a great inpatient rehab candidate and received medical clearance from Dr. Volanda Napoleon at [facility]. Pt and his wife are motivated to come to inpatient rehab and will benefit from the intensive services of skilled therapy under rehab physician guidance. Pt will be admitted today on 12-27-13.     Preadmission Screen Completed By:  Nanetta Batty PT 12/27/2013 1056 am ______________________________________________________________________    Discussed status with Dr. Letta Pate on 12-27-13 at 1056 and received telephone approval for admission today.   Admission Coordinator:  Nanetta Batty, PT time 1056/Date 12-27-13    Assessment/Plan: Diagnosis: Left putamen and corona radiata CVA Does the need for close, 24 hr/day  Medical supervision in concert with the patient's rehab needs make it  unreasonable for this patient to be served in a less intensive setting? Yes Co-Morbidities requiring supervision/potential complications: post-stroke sequelae, htn Due to bladder management, bowel management, safety, skin/wound  care, disease management, medication administration and patient education, does the patient require 24 hr/day rehab nursing? Yes Does the patient require coordinated care of a physician, rehab nurse, PT (1-2 hrs/day, 5 days/week), OT (1-2 hrs/day, 5 days/week) and SLP (1-2 hrs/day, 5 days/week) to address physical and functional deficits in the context of the above medical diagnosis(es)? Yes Addressing deficits in the following areas: balance, endurance, locomotion, strength, transferring, bowel/bladder control, bathing, dressing, feeding, grooming, toileting, speech and psychosocial support Can the patient actively participate in an intensive therapy program of at least 3 hrs of therapy 5 days a week? Yes The potential for patient to make measurable gains while on inpatient rehab is excellent Anticipated functional outcomes upon discharge from inpatients are: supervision and min assist PT, supervision and min assist OT, supervision to mod ISLP Estimated rehab length of stay to reach the above functional goals is: 14-18 days Does the patient have adequate social supports to accommodate these discharge functional goals? Yes Anticipated D/C setting: Home Anticipated post D/C treatments: HH therapy and Outpatient therapy Overall Rehab/Functional Prognosis: excellent       RECOMMENDATIONS: This patient's condition is appropriate for continued rehabilitative care in the following setting: CIR Patient has agreed to participate in recommended program. Yes Note that insurance prior authorization may be required for reimbursement for recommended care.   Comment: Admitting patient to CIR today.   Meredith Staggers, MD, [place], [place] 12/27/2013  Revision History...     Date/Time User Action   12/27/2013 11:09 AM Meredith Staggers, MD Sign   12/27/2013 10:59 AM [place] [facility]  View Details Report

## 2013-12-28 NOTE — Progress Notes (Signed)
Patient information reviewed and entered into eRehab system by Contrina Orona, RN, CRRN, PPS Coordinator.  Information including medical coding and functional independence measure will be reviewed and updated through discharge.    

## 2013-12-28 NOTE — Evaluation (Signed)
Speech Language Pathology Assessment and Plan  Patient Details  Name: [name] MRN: [id number] Date of Birth: 1955-11-02  SLP Diagnosis: Dysarthria  Rehab Potential: Good ELOS: 18 days   Today's Date: 12/28/2013 Time: 1000-1100 Time Calculation (min): 60 min  Problem List:  Patient Active Problem List   Diagnosis Date Noted  . CVA (cerebral infarction) 12/27/2013   Past Medical History: No past medical history on file. Past Surgical History:  Past Surgical History  Procedure Laterality Date  . Knee arthroscopy Right     Assessment / Plan / Recommendation Clinical Impression [name] is a 58 year old right handed male with history of HTN (no meds > 1 year) who was admitted to [place] on 08/03/015 with two day history of fluctuating RUE weakness progressive to difficulty speaking and some swallowing issues. He reported being under a lot of stress, working 16 days straight and family forced him to seek medical assistance. CT head with recent left lenticulostriate infarct. He was started on ASA for treatment and blood pressure noted to be labile and treated with hydralazine. He had worsening of symptoms with dense right hemiparesis that evening and follow up CCT with left basal ganglia infarct and no significant change. Neurology (Dr. Irish Elders) consulted for input and felt that patient with worsening of symptoms likely due to hypotension. MRI brain with acute nonhemorrhagic infarct left lenticular nucleus and mid corona radiata and tiny infarct anterior left frontal opercular region. He continues on ASA for secondary stroke prevention of thrombotic CVA. Patient with subsequent right facial droop with mild dysarthria and right hemiparesis. Therapy initiated and patient limited by right hemiparesis with impaired balance but very motivated. CIR recommended by MD/rehab team and patient admitted 12/27/13 to [facility].  Orders received; Cognitive-Linguistic Evaluation completed  and patient continues to demonstrate mild unilateral upper motor neuron dysarthria characterized by mild right sided lingual and labial weakness which results in imprecise consonant productions in phrase-sentence level of verbal expression.  Language and cognition appear intact.  Recommend skilled SLP services at a frequency of 3 times a week to address deficits and maximize functional communication prior to discharge home with wife.     Skilled Therapeutic Interventions          Cognitive-linguistic evaluation completed with results and recommendations reviewed with patient.  SLP also initiated treatment with education and regarding dysarthria and oral motor exercises, which patient returned demonstration of with Supervision cues.    SLP Assessment  Patient will need skilled [place] Pathology Services during CIR admission    Recommendations  Oral Care Recommendations: Oral care BID Patient destination: Home Follow up Recommendations: Other (comment) (TBD) Equipment Recommended: None recommended by SLP    SLP Frequency 3 out of 7 days   SLP Treatment/Interventions Cueing hierarchy;Environmental controls;Functional tasks;Internal/external aids;Oral motor exercises;Patient/family education;Speech/Language facilitation    Pain Pain Assessment Pain Assessment: No/denies pain Prior Functioning Cognitive/Linguistic Baseline: Within functional limits Type of Home: House  Lives With: Spouse Available Help at Discharge: Family Vocation: Full time employment  Short Term Goals: Week 1: SLP Short Term Goal 1 (Week 1): Patient will utilize speech intelligibility strategies at the sentence level during structured tasks with Supervision cues. SLP Short Term Goal 2 (Week 1): Patient will self-monitor and correct errors with Min verbal and non-vebral cues. SLP Short Term Goal 3 (Week 1): Patient will identify effective speech intelligibility strategies with Mod I. SLP Short Term Goal 4 (Week  1): Patient will complete oral motor exercises with Mod  I .  See FIM for current functional status Refer to Care Plan for Long Term Goals  Recommendations for other services: None  Discharge Criteria: Patient will be discharged from SLP if patient refuses treatment 3 consecutive times without medical reason, if treatment goals not met, if there is a change in medical status, if patient makes no progress towards goals or if patient is discharged from hospital.  The above assessment, treatment plan, treatment alternatives and goals were discussed and mutually agreed upon: by patient  Gunnar Fusi, M.A., CCC-SLP [phone]  [place] 12/28/2013, 12:01 PM

## 2013-12-28 NOTE — H&P (Signed)
Physical Medicine and Rehabilitation Admission H&P  CC: Right sided weakness, dysarthric speech.  HPI: [name] is a 58 year old RH-male with history of HTN (no meds > 1 year) who was admitted to [facility] on 08/03/015 with two day history of fluctuating RUE weakness progressive to difficulty speaking and some swallowing issues. He reported being under a lot of stress, working 16 days straight and family forced him to seek medical assistance. UDS negative. TSH- 2.54. Hgb A1c- 601. CT head with recent left lenticulostriate infarct. He was started on ASA for treatment and blood pressure noted to be labile and treated with hydralazine. He had worsening of symptoms with dense right hemiparesis that evening and follow up CCT with left basal ganglia infarct and no significant change. Neurology (Dr. Loretha BrasilZeylikman) consulted for input and felt that patient with worsening of symptoms likely due to hypotension. MRI brain with acute nonhemorrhagic infarct left lenticular nucleus and mid corona radiata and tiny infarct anterior left frontal opercular region. 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 Carotid dopplers done revealing "minimal to moderate amount of bilateral arthrosclerotic plaque R>L not resulting in hemodynamically significant stenosis." 2D echo With EF 60-65%, moderate LVH, impaired LV relaxation and no cardiac SOE.  He continues on ASA for secondary stroke prevention of thrombotic CVA. T Patient with subsequent right facial droop with mild dysarthria and right hemiparesis. Therapy initiated and patient limited by right hemiparesis with impaired balance but very motivated. CIR recommended by MD/rehab team and patient [facility]   Review of Systems  HENT: Negative for hearing loss.  Eyes: Negative for blurred vision and double vision.  Respiratory: Negative for cough and shortness of breath.  Cardiovascular: Negative for  chest pain, palpitations and leg swelling.  Gastrointestinal: Positive for constipation. Negative for heartburn and nausea.  Genitourinary: Negative for urgency and frequency.  Musculoskeletal: Negative for back pain and myalgias.  Neurological: Positive for speech change and focal weakness. Negative for dizziness, tingling and headaches.  Psychiatric/Behavioral: Negative for depression. The patient does not have insomnia.   No past medical history on file.    Past Surgical History    Procedure  Laterality  Date    .  Knee arthroscopy  Right        Family History    Problem  Relation  Age of Onset    .  Hypertension  Mother      Social History: Married. Works 2-3 part time jobs. Active--works out at home and occasionally bikes 20 miles/day. has no tobacco, alcohol, and drug history on file.  Allergies: No Known Allergies   (Not in a hospital admission)  Home:  One level home with 3 STE.  Functional History:  Independent without AD.  Works two jobs  Functional Status:  Mobility:  Moderate assist tranfers with right knee blocked.  Significant right and posterior bias with standing  Mod assist +2 with total assist to advance RLE and ambulate 15' with HHA.  Mild pusher tendencies noted.  ADL:  Moderate assist for LB dressing  Cognition:  Cognition WNL  Mild dysarthria with imprecise and mildly distorted verbal output.  Physical Exam:   Nursing note and vitals reviewed.  Constitutional: He is oriented to person, place, and time. He appears well-developed and well-nourished.  HENT: oral mucosa pink and moist. Edentulous---has full dentures Head: Normocephalic and atraumatic.  Eyes: Pupils are equal, round, and reactive to light. Right conjunctiva is slightly injected.  Left conjunctiva is slightly injected.  Neck: Normal range of motion. Neck supple.  Cardiovascular: Normal rate and regular rhythm. No murmur or rubs  Respiratory: Effort normal and breath sounds normal. No  respiratory distress. He has no wheezes.  GI: Soft. Bowel sounds are normal. He exhibits no distension. There is no tenderness.  Musculoskeletal: He exhibits no edema and no tenderness.  Neurological: He is alert and oriented to person, place, and time.  Right facial weakness with mild dysarthria. CN exam otherwise intact.  Able to follow 1 and 2 step commands without difficulty. Good insight and awareness.  Dense right hemiparesis: 0/5 deltoid, bicep, tricep, hand, RLE: 1+ HE/HF, trace, absent ADF/APF. Sensation intact, withdraws to pain. DTR decreased on right upper ext and lower ext.   Skin: Skin is warm and dry.  Psychiatric: He has a normal mood and affect. His behavior is normal. Judgment and thought content normal.   Labs:  Lipid panel--Chol: 208 HDL: 49 LDL:147 Trig: 59  ZOXWR--UE:454 K: 3.7 Cl: 101 Co2: 28 BUN: 6 Cr: 1.11 Gluc: 109  CBC--Hgb: 15.3 WBC: 6.2 Plt: 258    Medical Problem List and Plan:  1. 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  2. DVT Prophylaxis/Anticoagulation: Pharmaceutical: Lovenox. Will check dopplers to rule out DVT.  3. Pain Management: N/A  4. Mood: Motivated to get better. Will have LCSW follow for evaluation and support.  5. Neuropsych: This patient is capable of making decisions on his own behalf.  6. Skin/Wound Care: Monitor skin with routine checks. Maintain adequate hydration and nutritional status.  7. HTN: Will monitor BP every 8 hours. Avoid hypotension to allow adequate perfusion.  8. Dyslipidemia: Continue zocor.   Post Admission Physician Evaluation:  1. Functional deficits secondary to Left lenticular and left coronal radiata thrombotic stroke 2. Patient is admitted to receive collaborative, interdisciplinary care between the physiatrist, rehab nursing staff, and therapy team. 3. Patient's level of medical complexity and substantial therapy needs in context of that medical necessity cannot be  provided at a lesser intensity of care such as a SNF. 4. Patient has experienced substantial functional loss from his/her baseline which was documented above under the "Functional History" and "Functional Status" headings. Judging by the patient's diagnosis, physical exam, and functional history, the patient has potential for functional progress which will result in measurable gains while on inpatient rehab. These gains will be of substantial and practical use upon discharge in facilitating mobility and self-care at the household level. 5. Physiatrist will provide 24 hour management of medical needs as well as oversight of the therapy plan/treatment and provide guidance as appropriate regarding the interaction of the two. 6. 24 hour rehab nursing will assist with bladder management, bowel management, safety, skin/wound care, disease management, medication administration and patient education and help integrate therapy concepts, techniques,education, etc. 7. PT will assess and treat for/with: Lower extremity strength, range of motion, stamina, balance, functional mobility, safety, adaptive techniques and equipment, NMR, orthotics, coping skills, community reintegration. Goals are: mod I to supervision. 8. OT will assess and treat for/with: ADL's, functional mobility, safety, upper extremity strength, adaptive techniques and equipment, NMR, coping skills, orthotics, leisure awareness, stroke education. Goals are: min assist to mod I. 9. SLP will assess and treat for/with: speech, communication. Goals are: mod I. 10. Case Management and Social Worker will assess and treat for psychological issues and discharge planning. 11. Team conference will be held weekly to assess progress toward goals and to determine barriers to  discharge. 12. Patient will receive at least 3 hours of therapy per day at least 5 days per week. 13. ELOS: 16-22 days  14. Prognosis: excellent  Ranelle Oyster, MD, [facility]  Physical Medicine & Rehabilitation   12/27/2013

## 2013-12-28 NOTE — Evaluation (Signed)
Physical Therapy Assessment and Plan  Patient Details  Name: [name] MRN: [id number] Date of Birth: [birth year]  PT Diagnosis: Abnormal posture, Abnormality of gait, Coordination disorder, Hemiparesis dominant, Hypotonia, Impaired sensation and Muscle weakness, Impaired balance Rehab Potential: Good ELOS: 3 weeks   Today's Date: 12/28/2013 Time: 1405-1505  60 min  Problem List:  Patient Active Problem List   Diagnosis Date Noted  . CVA (cerebral infarction) 12/27/2013    Past Medical History: No past medical history on file. Past Surgical History:  Past Surgical History  Procedure Laterality Date  . Knee arthroscopy Right     Assessment & Plan Clinical Impression: [name] is a 58 year old RH-male with history of HTN (no meds > 1 year) who was admitted to [facility] on 08/03/015 with two day history of fluctuating RUE weakness progressive to difficulty speaking and some swallowing issues. He reported being under a lot of stress, working 16 days straight and family forced him to seek medical assistance. UDS negative. TSH- 2.54. Hgb A1c- 601. CT head with recent left lenticulostriate infarct. He was started on ASA for treatment and blood pressure noted to be labile and treated with hydralazine. He had worsening of symptoms with dense right hemiparesis that evening and follow up CCT with left basal ganglia infarct and no significant change. Neurology (Dr. Irish Elders) consulted for input and felt that patient with worsening of symptoms likely due to hypotension. MRI brain with acute nonhemorrhagic infarct left lenticular nucleus and mid corona radiata and tiny infarct anterior left frontal opercular region. 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 Carotid dopplers done revealing "minimal to moderate amount of bilateral arthrosclerotic plaque R>L not resulting in hemodynamically significant stenosis." 2D echo With EF 60-65%, moderate LVH,  impaired LV relaxation and no cardiac SOE.  He continues on ASA for secondary stroke prevention of thrombotic CVA. T Patient with subsequent right facial droop with mild dysarthria and right hemiparesis. Therapy initiated and patient limited by right hemiparesis with impaired balance but very motivated. Patient transferred to CIR on 12/27/2013 .   Patient currently requires min to +2 assist with mobility secondary to R hemiparesis, impaired balance, muscle weakness, decreased cardiorespiratoy endurance and impaired timing and sequencing, abnormal tone, unbalanced muscle activation and decreased coordination.  Prior to hospitalization, patient was independent  with mobility and lived with Spouse in a one level House home.  Home access is 3 in front, 2 in backStairs to enter.  Patient will benefit from skilled PT intervention to maximize safe functional mobility, minimize fall risk and decrease caregiver burden for planned discharge home with intermittent assist.  Anticipate patient will benefit from follow up OP at discharge.  PT - End of Session Activity Tolerance: Decreased this session;Tolerates 30+ min activity with multiple rests Endurance Deficit: Yes Endurance Deficit Description: patient demo increased RLE fatigue with ambulation/therapeutic activity PT Assessment Rehab Potential: Good Barriers to Discharge: Decreased caregiver support;Inaccessible home environment PT Plan PT Intensity: Minimum of 1-2 x/day ,45 to 90 minutes PT Frequency: 5 out of 7 days PT Duration Estimated Length of Stay: 3 weeks PT Treatment/Interventions: Ambulation/gait training;Balance/vestibular training;Discharge planning;DME/adaptive equipment instruction;Functional mobility training;Functional electrical stimulation;Neuromuscular re-education;Patient/family education;Psychosocial support;Stair training;Therapeutic Activities;Therapeutic Exercise;UE/LE Strength taining/ROM;UE/LE Coordination activities;Wheelchair  propulsion/positioning;Community reintegration PT Recommendation Follow Up Recommendations: Outpatient PT Patient destination: Home Equipment Recommended: To be determined Equipment Details: TBD upon d/c  Skilled Therapeutic Intervention Skilled therapeutic intervention initiated after completion of evaluation. Discussed with patient falls risk, safety within  room, and focus of therapy during stay. Discussed possible LOS, goals, and f/u therapy.  PT Evaluation Precautions/Restrictions Precautions Precautions: Fall Precaution Comments: Dense R hemi Restrictions Weight Bearing Restrictions: No Pain Pain Assessment Pain Assessment: No/denies pain Home Living/Prior Functioning Home Living Available Help at Discharge: Family;Other (Comment) (Wife taking FMLA, sister (retired) may be able to help after wife returns to work) Type of Home: House Home Access: Stairs to enter [employer] of Steps: 3 in front, 2 in back Entrance Stairs-Rails: None Home Layout: One level Additional Comments: hemiwalker would possibly fit  Lives With: Spouse Prior Function Level of Independence: Independent with basic ADLs;Independent with gait;Independent with transfers  Able to Take Stairs?: Yes Driving: Yes 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[employer]) Leisure: Hobbies-yes (Comment) Comments: bike, reading, cards Vision/Perception   WFL, no changes from baseline  Cognition Overall Cognitive Status: Within Functional Limits for tasks assessed Arousal/Alertness: Awake/alert Orientation Level: Oriented X4 Memory: Appears intact Awareness: Appears intact Problem Solving: Appears intact Safety/Judgment: Appears intact Sensation Sensation Light Touch: Impaired Detail Light Touch Impaired Details: Impaired RUE Stereognosis: Appears Intact Hot/Cold: Appears Intact Proprioception: Appears Intact Coordination Gross Motor Movements are Fluid and Coordinated: No Fine Motor  Movements are Fluid and Coordinated: No Motor  Motor Motor: Hemiplegia  Mobility Bed Mobility Bed Mobility: Supine to Sit;Sit to Supine Rolling Right: 3: Mod assist Supine to Sit: 4: Min assist Supine to Sit Details: Verbal cues for technique;Verbal cues for sequencing Sit to Supine: 4: Min assist Sit to Supine - Details: Verbal cues for technique;Verbal cues for sequencing Transfers Transfers: Yes Squat Pivot Transfers: 4: Min assist;3: Mod assist Squat Pivot Transfer Details: Verbal cues for precautions/safety;Verbal cues for sequencing;Verbal cues for technique;Manual facilitation for weight shifting Squat Pivot Transfer Details (indicate cue type and reason): pt mildly impulsive, requires verbal cues to slow down for safety Locomotion  Ambulation Ambulation: Yes Ambulation/Gait Assistance: 1: +2 Total assist Ambulation Distance (Feet): 25 Feet Assistive device: Other (Comment) (3 Musketeers style) Ambulation/Gait Assistance Details: Manual facilitation for weight shifting;Tactile cues for placement;Manual facilitation for placement;Verbal cues for sequencing;Verbal cues for technique;Verbal cues for precautions/safety Ambulation/Gait Assistance Details: Requires verbal cues/facilitation of weight shift to L in order to advance RLE independently Gait Gait: Yes Gait Pattern: Impaired Gait Pattern: Step-to pattern;Decreased stance time - right;Decreased step length - left;Decreased step length - right;Decreased dorsiflexion - right;Decreased weight shift to left;Right foot flat;Right flexed knee in stance;Decreased trunk rotation;Narrow base of support Gait velocity: decreased High Level Ambulation High Level Ambulation: Backwards walking Backwards Walking: +2 A for safety Stairs / Additional Locomotion Stairs: Yes Stairs Assistance: 1: +2 Total assist Stairs Assistance Details: Manual facilitation for weight shifting;Manual facilitation for placement;Verbal cues for  precautions/safety;Tactile cues for weight shifting;Verbal cues for sequencing Stair Management Technique: One rail Left;Forwards Number of Stairs: 5 Height of Stairs: 6 Wheelchair Mobility Wheelchair Mobility: Yes Wheelchair Assistance: 5: Supervision Wheelchair Assistance Details: Verbal cues for technique;Verbal cues for precautions/safety Wheelchair Propulsion: Left upper extremity;Left lower extremity Wheelchair Parts Management: Needs assistance Distance: 150 ft  Trunk/Postural Assessment  Cervical Assessment Cervical Assessment: Within Functional Limits Thoracic Assessment Thoracic Assessment: Within Functional Limits Lumbar Assessment Lumbar Assessment: Within Functional Limits Postural Control Postural Control: Deficits on evaluation Protective Responses: delayed  Balance Balance Balance Assessed: Yes Dynamic Sitting Balance Dynamic Sitting - Balance Support: Feet supported;During functional activity Dynamic Sitting - Level of Assistance: 5: Stand by assistance Dynamic Sitting - Balance Activities: Forward lean/weight shifting;Reaching across midline;Reaching for objects;Lateral lean/weight shifting Static Standing  Balance Static Standing - Balance Support: Right upper extremity supported;During functional activity Static Standing - Level of Assistance: 3: Mod assist;2: Max assist Dynamic Standing Balance Dynamic Standing - Balance Support: During functional activity;Bilateral upper extremity supported Dynamic Standing - Level of Assistance: 1: +2 Total assist Extremity Assessment  RUE Assessment RUE Assessment: Exceptions to [facility] RUE Strength RUE Overall Strength: Deficits RUE Overall Strength Comments: 0/5 absent throughout R UE LUE Assessment LUE Assessment: Within Functional Limits RLE Assessment RLE Assessment: Exceptions to [facility] RLE Strength RLE Overall Strength: Deficits RLE Overall Strength Comments: 1+/5 trace hip flex/knee flex, 0/5 ankle PF/DF LLE  Assessment LLE Assessment: Within Functional Limits  FIM:  FIM - Bed/Chair Transfer Bed/Chair Transfer Assistive Devices: Arm rests Bed/Chair Transfer: 4: Supine > Sit: Min A (steadying Pt. > 75%/lift 1 leg);4: Sit > Supine: Min A (steadying pt. > 75%/lift 1 leg);3: Bed > Chair or W/C: Mod A (lift or lower assist);3: Chair or W/C > Bed: Mod A (lift or lower assist) FIM - Locomotion: Wheelchair Distance: 150 ft Locomotion: Wheelchair: 5: Travels 150 ft or more: maneuvers on rugs and over door sills with supervision, cueing or coaxing FIM - Locomotion: Ambulation Locomotion: Ambulation Assistive Devices: Other (comment) (3 musketeers style) Ambulation/Gait Assistance: 1: +2 Total assist Locomotion: Ambulation: 1: Two helpers FIM - Locomotion: Stairs Locomotion: Scientist, physiological: Insurance account manager - 1 Locomotion: Stairs: 1: Two helpers   Refer to [employer] for Long Term Goals  Recommendations for other services: None  Discharge Criteria: Patient will be discharged from PT if patient refuses treatment 3 consecutive times without medical reason, if treatment goals not met, if there is a change in medical status, if patient makes no progress towards goals or if patient is discharged from hospital.  The above assessment, treatment plan, treatment alternatives and goals were discussed and mutually agreed upon: by patient  Laretta Alstrom 12/28/2013, 7:54 PM

## 2013-12-28 NOTE — Progress Notes (Signed)
Social Work Assessment and Plan Social Work Assessment and Plan  Patient Details  Name: [name] MRN: [id number] Date of Birth: 12/23/1955  Today's Date: 12/28/2013  Problem List:  Patient Active Problem List   Diagnosis Date Noted  . CVA (cerebral infarction) 12/27/2013   Past Medical History: No past medical history on file. Past Surgical History:  Past Surgical History  Procedure Laterality Date  . Knee arthroscopy Right    Social History:  reports that he has never smoked. He does not have any smokeless tobacco history on file. He reports that he does not drink alcohol or use illicit drugs.  Family / Support Systems Marital Status: Married Patient Roles: Spouse Spouse/Significant Other: Rosamond  [phone] Other Supports: Theatre stage managerMildred Scrivens-sister  [phone] Anticipated Caregiver: Wife Ability/Limitations of Caregiver: Wife works but plans to take FMLA to assist pt at discharge Caregiver Availability: 24/7 Family Dynamics: Close knit family who are there for one another.  Pt reports it is usually him who is the one helping but this time it is switched around.  Pt reports they have a small family but close and supportive.  Social History Preferred language: English Religion: Unknown Cultural Background: no issues Education: High School Read: Yes Write: Yes Employment Status: Employed Name of Employer: [employer] Return to Work Plans: Would like to return once recovered Fish farm managerLegal Hisotry/Current Legal Issues: No issues Guardian/Conservator: None-according to MD pt is capable of making his own decisions while here.   Abuse/Neglect Physical Abuse: Denies Verbal Abuse: Denies Sexual Abuse: Denies Exploitation of patient/patient's resources: Denies Self-Neglect: Denies  Emotional Status Pt's affect, behavior adn adjustment status: Pt is motivated to improve and regain his independence.  He has always been healthy and not used to being in a hospital  or a patient.  He plans to work hard and recover as much function as he can before he is discharged from here. Recent Psychosocial Issues: healthy, thought he was anyway was not taking any medicines prior to admission Pyschiatric History: No history feels he is doing well, but would benefit from seeing Neuro-psych before discharged from here, due to his young age.  He is upbeat and focuses on the positives of the situation.  He is having a good first day here. Substance Abuse History: No issues  Patient / Family Perceptions, Expectations & Goals Pt/Family understanding of illness & functional limitations: Pt is able to explain his stroke and deficits.  He has made some progress since having the stroke and this is encouraging to him, that he will make more while here.  He has high expectations of himself and his progress while here. Premorbid pt/family roles/activities: Husband, Human resources officermployee, Church member, Diplomatic Services operational officerCyclist, Brother, Catering manageretc Anticipated changes in roles/activities/participation: resume Pt/family expectations/goals: Pt states: " I want to be able to take care of myself by the time I leave here."   Wife states: " We are hoping for the best and will deal with what we need too."  [employer] Community Agencies: None Premorbid Home Care/DME Agencies: None Transportation available at discharge: Wife and sister Resource referrals recommended: Support group (specify);Neuropsychology (CVA Support group)  Discharge Planning Living Arrangements: Spouse/significant other Support Systems: Spouse/significant other;Other relatives;Friends/neighbors;Church/faith community Type of Residence: Private residence Insurance Resources: Media plannerrivate Insurance (specify) ([place] Surveyor, quantityinancial Resources: Employment;Family Support Financial Screen Referred: No Living Expenses: Lives with family Money Management: Spouse;Patient Does the patient have any problems obtaining your medications?: No Home Management:  Both do the home management Patient/Family Preliminary Plans: Return home with  wife who is planning on taking a FMLA to provide assistance to pt.  The hope is once her leave is over he will be able to stay alone or is recovered.  Pt's sister is involved and can assist some.  Will await team's evaluations. Social Work Anticipated Follow Up Needs: HH/OP;Support Group  Clinical Impression Pleasant motivated gentleman who is willing to work hard to regain his function.  His wife and sister are very supportive and involved.  Wife plans to take a FMLA to assist at discharge. Would benefit from Neuro-psych while here.  Will set up with PCP while here and provide support.  Work on a safe discharge plan.   Lucy Chris 12/28/2013, 12:02 PM

## 2013-12-28 NOTE — Progress Notes (Signed)
Occupational Therapy Assessment and Plan  Patient Details  Name: [name] MRN: [id number] Date of Birth: [birth year]  OT Diagnosis: hemiplegia affecting dominant side Rehab Potential: Rehab Potential: Excellent ELOS: 3 weeks   Today's Date: 12/28/2013 Time: 8938-1017 Time Calculation (min): 60 min  Problem List:  Patient Active Problem List   Diagnosis Date Noted  . CVA (cerebral infarction) 12/27/2013    Past Medical History: No past medical history on file. Past Surgical History:  Past Surgical History  Procedure Laterality Date  . Knee arthroscopy Right     Assessment & Plan Clinical Impression: Patient is a 58 y.o. year old  RH-male with history of HTN (no meds > 1 year) who was admitted to [facility] on 08/03/015 with two day history of fluctuating RUE weakness progressive to difficulty speaking and some swallowing issues. He reported being under a lot of stress, working 16 days straight and family forced him to seek medical assistance. CT head with recent left lenticulostriate infarct. He was started on ASA for treatment and blood pressure noted to be labile and treated with hydralazine. He had worsening of symptoms with dense right hemiparesis that evening and follow up CCT with left basal ganglia infarct and no significant change. Neurology (Dr. Irish Elders) consulted for input and felt that patient with worsening of symptoms likely due to hypotension. MRI brain with acute nonhemorrhagic infarct left lenticular nucleus and mid corona radiata and tiny infarct anterior left frontal opercular region. Patient transferred to [facility] on 12/27/2013 .    Patient currently requires max with basic self-care skills secondary to muscle weakness and muscle paralysis, decreased cardiorespiratoy endurance, decreased coordination and flaccid R UE and decreased standing balance, decreased postural control and decreased balance strategies.  Prior to hospitalization, patient could complete ADLs, IADLs,  Driving, and working full time with independent .  Patient will benefit from skilled intervention to decrease level of assist with basic self-care skills prior to discharge home with care partner.  Anticipate patient will require 24 hour supervision and minimal physical assistance and follow up home health.  OT - End of Session Endurance Deficit: Yes OT Assessment Rehab Potential: Excellent Barriers to Discharge:  (possibly family assist but pt reports wife is taking FMLA currently to assist.) OT Patient demonstrates impairments in the following area(s): Balance;Endurance;Motor;Safety;Other (Comment) (self care) OT Basic ADL's Functional Problem(s): Grooming;Bathing;Dressing;Toileting OT Transfers Functional Problem(s): Toilet;Tub/Shower OT Additional Impairment(s): Fuctional Use of Upper Extremity OT Plan OT Intensity: Minimum of 1-2 x/day, 45 to 90 minutes OT Frequency: 5 out of 7 days OT Duration/Estimated Length of Stay: 3 weeks OT Treatment/Interventions: Balance/vestibular training;Discharge planning;DME/adaptive equipment instruction;Psychosocial support;Therapeutic Activities;UE/LE Strength taining/ROM;UE/LE Coordination activities;Therapeutic Exercise;Self Care/advanced ADL retraining;Patient/family education;Neuromuscular re-education OT Basic Self-Care Anticipated Outcome(s): min A OT Toileting Anticipated Outcome(s): Min A OT Bathroom Transfers Anticipated Outcome(s): Min A OT Recommendation Patient destination: Home Follow Up Recommendations: Home health OT (pt. request) Equipment Recommended: Tub/shower bench;3 in 1 bedside comode Equipment Details: Pt reports owning no equipment   Skilled Therapeutic Intervention OT evaluation initiated and completed this session. Pt educated on OT purpose, POC, and goals. Session focused on hemiplegic dressing techniques for UB and LB dressing, energy conservation, dynamic standing balance, dynamic sitting balance, and patient education.  Pt performed bathing seated on EOB and dressing performed seated in wheelchair at sink side. OT educated and demonstrated squat pivot transfer. Pt performed Bed > wheelchair squat pivot transfer with Mod A and verbal cues. Pt appears to be very motivated regarding therapy sessions.   OT Evaluation  Precautions/Restrictions  Precautions Precautions: Fall Precaution Comments: R sided hemiplegia Restrictions Weight Bearing Restrictions: No General Chart Reviewed: Yes Vital Signs Therapy Vitals Temp: 98.1 F (36.7 C) Temp src: Oral Pulse Rate: 79 Resp: 17 BP: 145/91 mmHg (RN notified) Patient Position (if appropriate): Sitting Oxygen Therapy SpO2: 96 % O2 Device: None (Room air) Pain Pain Assessment Pain Assessment: No/denies pain Home Living/Prior Functioning Home Living Living Arrangements: Spouse/significant other Available Help at Discharge: Family Type of Home: House Home Access: Stairs to enter [employer] of Steps: 3 in front, 2 in back Entrance Stairs-Rails: None Home Layout: One level Additional Comments: hemiwalker would possibly fit  Lives With: Spouse Prior Function Level of Independence: Independent with basic ADLs;Independent with gait;Independent with transfers  Able to Take Stairs?: Yes Driving: Yes Vocation: Full time employment Leisure: Hobbies-yes (Comment) Comments: bike, reading, cards Vision/Perception  Vision- History Baseline Vision/History: No visual deficits Patient Visual Report: No change from baseline  Cognition Overall Cognitive Status: Within Functional Limits for tasks assessed Arousal/Alertness: Awake/alert Orientation Level: Oriented X4 Memory: Appears intact Awareness: Appears intact Problem Solving: Appears intact Safety/Judgment: Appears intact Sensation Sensation Light Touch: Impaired Detail Light Touch Impaired Details: Impaired RUE Stereognosis: Appears Intact Hot/Cold: Appears Intact Proprioception: Appears  Intact Coordination Gross Motor Movements are Fluid and Coordinated: No Fine Motor Movements are Fluid and Coordinated: No Motor  Motor Motor: Hemiplegia (R side) Mobility  Bed Mobility Bed Mobility: Rolling Right Rolling Right: 3: Mod assist  Trunk/Postural Assessment  Cervical Assessment Cervical Assessment: Within Functional Limits Thoracic Assessment Thoracic Assessment: Within Functional Limits Lumbar Assessment Lumbar Assessment: Within Functional Limits Postural Control Postural Control:  (weakness on R side, lateral lean towards the R when in seated position)  Balance Pt required Max A to stand at sink for grooming and to pull up pants and underwear. R knee blocked secondary to buckling and R hand utilized on counter to [employer]  Extremity/Trunk Assessment RUE Assessment RUE Assessment: Exceptions to [facility] (0/5 throughout entire R UE) LUE Assessment LUE Assessment: Within Functional Limits  FIM: eating -set up A, Min A for grooming, Mod A for bathing, UB dressing with Max A, and LB dressing with Total A.   Refer to Care Plan for Long Term Goals  Recommendations for other services: None  Discharge Criteria: Patient will be discharged from OT if patient refuses treatment 3 consecutive times without medical reason, if treatment goals not met, if there is a change in medical status, if patient makes no progress towards goals or if patient is discharged from hospital.  The above assessment, treatment plan, treatment alternatives and goals were discussed and mutually agreed upon: by patient  [name] 12/28/2013, 4:10 PM

## 2013-12-28 NOTE — Care Management Note (Signed)
[facility] Individual Statement of Services  Patient Name:  [name]  Date:  12/28/2013  Welcome to the [facility].  Our goal is to provide you with an individualized program based on your diagnosis and situation, designed to meet your specific needs.  With this comprehensive rehabilitation program, you will be expected to participate in at least 3 hours of rehabilitation therapies Monday-Friday, with modified therapy programming on the weekends.  Your rehabilitation program will include the following services:  Physical Therapy (PT), Occupational Therapy (OT), Speech Therapy (ST), 24 hour per day rehabilitation nursing, Therapeutic Recreaction (TR), Neuropsychology, Case Management (Social Worker), Rehabilitation Medicine, Nutrition Services and Pharmacy Services  Weekly team conferences will be held on Wednesday to discuss your progress.  Your Social Worker will talk with you frequently to get your input and to update you on team discussions.  Team conferences with you and your family in attendance may also be held.  Expected length of stay: 18-21 days  Overall anticipated outcome: supervision/min level  Depending on your progress and recovery, your program may change. Your Social Worker will coordinate services and will keep you informed of any changes. Your Social Worker's name and contact numbers are listed  below.  The following services may also be recommended but are not provided by the [facility]:   Driving Evaluations  Home Health Rehabiltiation Services  Outpatient Rehabilitation Services  Vocational Rehabilitation   Arrangements will be made to provide these services after discharge if needed.  Arrangements include referral to agencies that provide these services.  Your insurance has been verified to be:  [employer] Your primary doctor is:  None  Pertinent information will be shared with your doctor and  your insurance company.  Social Worker:  Dossie DerBecky Petra Sargeant, [phone] or ([phone]) [phone]  Information discussed with and copy given to patient by: Lucy Chrisupree, Hillary Struss G, 12/28/2013, 10:03 AM

## 2013-12-29 ENCOUNTER — Inpatient Hospital Stay (HOSPITAL_COMMUNITY): Payer: BC Managed Care – PPO | Admitting: Speech Pathology

## 2013-12-29 ENCOUNTER — Inpatient Hospital Stay (HOSPITAL_COMMUNITY): Payer: BC Managed Care – PPO | Admitting: Physical Therapy

## 2013-12-29 ENCOUNTER — Encounter (HOSPITAL_COMMUNITY): Payer: BC Managed Care – PPO | Admitting: Occupational Therapy

## 2013-12-29 ENCOUNTER — Encounter (HOSPITAL_COMMUNITY): Payer: Self-pay | Admitting: *Deleted

## 2013-12-29 DIAGNOSIS — E785 Hyperlipidemia, unspecified: Secondary | ICD-10-CM

## 2013-12-29 DIAGNOSIS — I10 Essential (primary) hypertension: Secondary | ICD-10-CM

## 2013-12-29 DIAGNOSIS — I6330 Cerebral infarction due to thrombosis of unspecified cerebral artery: Secondary | ICD-10-CM

## 2013-12-29 NOTE — Progress Notes (Signed)
Physical Therapy Session Note  Patient Details  Name: Nelva Naynthony Sargeant MRN: [id number] Date of Birth: 04/23/1956  Today's Date: 12/29/2013 Time: 1530-1600 Time Calculation (min): 30 min  Short Term Goals: Week 1:  PT Short Term Goal 1 (Week 1): Pt will perform bed mobility with mod I via log roll technique. PT Short Term Goal 2 (Week 1): Pt will transfer bed <> w/c with supervision.  PT Short Term Goal 3 (Week 1): Pt will propel w/c using L hemi technique with mod I.  PT Short Term Goal 4 (Week 1): Pt will ambulate with max A x 1.  PT Short Term Goal 5 (Week 1): Pt will negotiate 3 stairs using 1 rail and max A x 1.  Skilled Therapeutic Interventions/Progress Updates:    Neuromuscular Reeducation: Neurofacilitory techniques to R LE for muscle activation - synergistic patterns emerged: bridging, hip IR in B hook lie with resisting of L LE, resisted R hip flexion to elicit ankle DF: 3 x 10 reps each.  Neurofacilitory technique to R UE in sitting for triceps muscle activation - PT assists with planting R hand on mat and instructs pt to lean on forearm, then attempt to sit up by pushing arm straight (as opposed to only leaning L) x 10 reps.   Therapeutic Activity: PT instructs pt in squat-pivot transfer to L w/c to mat - pt demonstrates a scoot transfer req min A for safety. PT educates pt about foot placement for pivoting. Pt demonstrates SBA sit to supine transfer on mat. Pt req min A for R LE supine to sit on mat and CGA scoot transfer mat to w/c to R. PT educates pt re: head-hips relationship, esp in relation to scooting bottom back in w/c. PT instructs pt in ascending/descending single low step with L rail req mod A for R knee blocking during ascent and placement assist of R foot during descent and verbal cues for weight shift x 4 reps.   Pt demonstrates good potential for muscle return on R side. Pt is a very [employer] worker. PT shows pt how he can stretch his R wrist into extension with his L hand  when sitting up in w/c and emphasizes for pt to stretch it GENTLY and do 30 count holds. Pt verbalizes understanding. Pt will benefit from continued NMR to R UE/LE and core, transfer training with head-hips relationship progressing to stand-pivot transfer, progressive gait training with HW and AFO, as well as stair training, as is safe.   Therapy Documentation Precautions:  Precautions Precautions: Fall Precaution Comments: Dense R hemi Restrictions Weight Bearing Restrictions: No    Pain: Pain Assessment Pain Assessment: No/denies pain      See FIM for current functional status  Therapy/Group: Individual Therapy  Abdulloh Ullom M 12/29/2013, 3:26 PM

## 2013-12-29 NOTE — Progress Notes (Signed)
Speech Language Pathology Daily Session Note  Patient Details  Name: [name] MRN: [id number] Date of Birth: 05/23/1956  Today's Date: 12/29/2013 Time: 0915-1000 Time Calculation (min): 45 min  Short Term Goals: Week 1: SLP Short Term Goal 1 (Week 1): Patient will utilize speech intelligibility strategies at the sentence level during structured tasks with Supervision cues. SLP Short Term Goal 2 (Week 1): Patient will self-monitor and correct errors with Min verbal and non-vebral cues. SLP Short Term Goal 3 (Week 1): Patient will identify effective speech intelligibility strategies with Mod I. SLP Short Term Goal 4 (Week 1): Patient will complete oral motor exercises with Mod I .  Skilled Therapeutic Interventions: Skilled intervention provided with focus on self-care/home management goals. Pt seen in room for ST treatment. SLP reviewed handout of OME. Pt performed OME x20 each, min verbal/visual cues provided. Pt completed structured/functional reading tasks from magazine, paragraph level. Slp provided min-mod verbal cues to utilize speech intel strategies. Pt responded well to cuing and self-corrected during reading task x2.    FIM:  Comprehension Comprehension Mode: Auditory Comprehension: 5-Understands complex 90% of the time/Cues < 10% of the time Expression Expression Mode: Verbal Expression: 5-Expresses basic needs/ideas: With extra time/assistive device Social Interaction Social Interaction: 7-Interacts appropriately with others - No medications needed. Problem Solving Problem Solving: 6-Solves complex problems: With extra time Memory Memory: 6-More than reasonable amt of time FIM - Eating Eating Activity: 5: Set-up assist for open containers  Pain Pain Assessment Pain Assessment: No/denies pain Pain Score: 0-No pain  Therapy/Group: Individual Therapy  Ho Parisi, Kara PacerLeah N 12/29/2013, 11:16 AM

## 2013-12-29 NOTE — Progress Notes (Signed)
Occupational Therapy Session Note  Patient Details  Name: [name] MRN: [id number] Date of Birth: 07/31/1955  Today's Date: 12/29/2013 Time: [id number] Time Calculation (min): 60 min  Short Term Goals: Week 1:  OT Short Term Goal 1 (Week 1): Pt will perform grooming with set up A in order to increase I in self care OT Short Term Goal 2 (Week 1): Pt. will perform bathing with Min A at sinkside in order to increase I in self care. OT Short Term Goal 3 (Week 1): Pt will perform toilet transfer with Mod A in order to increase I in functional transfers. OT Short Term Goal 4 (Week 1): Pt will perform UB dressing with Min A utilizing hemiplegic technique in order to increase I with dressing.  OT Short Term Goal 5 (Week 1): Pt will perform LB dressing with Max A in order to increase I with self care.  Skilled Therapeutic Interventions/Progress Updates:   Pt supine in bed with lights off upon entering the room. Pt with no c/o pain during session. OT session with focus on ADL retraining, use of R UE as stabilizer and to weight bear, dynamic standing balance, and functional transfers. Pt performed squat pivot transfer to wheelchair and then wheelchair <> shower chair with Mod A. Pt required assistance to wash L UE, feet, and buttocks. Dressing and grooming performed seated in wheelchair at sinkside. Sit to stand with Max A and verbal cues for posture and to adjust balance. Therapist manually blocking R knee into extension when standing. When standing R hand placed onto sink for weight bearing and as a stabilizer with A required to maintain position. Dynamic standing balance with Mod A while performing clothing management for LB dressing. Pt making progress towards goals.   Therapy Documentation Precautions:  Precautions Precautions: Fall Precaution Comments: Dense R hemi Restrictions Weight Bearing Restrictions: No Pain:  No c/o pain during ADL session  See FIM for current functional  status  Therapy/Group: Individual Therapy  Lowella Gripittman, Moselle Rister L 12/29/2013, 8:21 AM

## 2013-12-29 NOTE — Progress Notes (Signed)
Physical Therapy Session Note  Patient Details  Name: [name] MRN: [id number] Date of Birth: 10/14/1955  Today's Date: 12/29/2013 Time: [id number] Time Calculation (min): 57 min  Short Term Goals: Week 1:  PT Short Term Goal 1 (Week 1): Pt will perform bed mobility with mod I via log roll technique. PT Short Term Goal 2 (Week 1): Pt will transfer bed <> w/c with supervision.  PT Short Term Goal 3 (Week 1): Pt will propel w/c using L hemi technique with mod I.  PT Short Term Goal 4 (Week 1): Pt will ambulate with max A x 1.  PT Short Term Goal 5 (Week 1): Pt will negotiate 3 stairs using 1 rail and max A x 1.  Skilled Therapeutic Interventions/Progress Updates:  Pt was seen bedside in the pm. Pt propelled w/c about 150 feet with L UE and LE with S. Pt transferred w/c to edge of mat with mod A and verbal cues. Pt transferred edge of mat to w/c with min A scoot pivot transfer. Pt performed multiple sit to stand transfers in and out of parallel bars with min to mod A. While standing treatment focused on weighting shifting and unilateral stance in preparation for gait. Treatment worked on standing with hemi walker with min to mod A. Pt ambulated with hemi walker, R toe off brace and mod A for 20 feet with multiple verbal cues. Pt ambulated 20 feet with hemi walker, R toe off brace and min to mod A with verbal cues. Pt returned to room propelling w/c with L UE and LE.   Therapy Documentation Precautions:  Precautions Precautions: Fall Precaution Comments: Dense R hemi Restrictions Weight Bearing Restrictions: No General:  Pain: Pt c/o mild pain R shoulder.    Locomotion : Ambulation Ambulation/Gait Assistance: 3: Mod assist   See FIM for current functional status  Therapy/Group: Individual Therapy  Rayford HalstedMitchell, Elaura Calix G 12/29/2013, 3:01 PM

## 2013-12-29 NOTE — Plan of Care (Signed)
Problem: RH BOWEL ELIMINATION Goal: RH STG MANAGE BOWEL W/MEDICATION W/ASSISTANCE STG Manage Bowel with Medication with Mod I Assistance.  Outcome: Progressing LBM 8/7, but independently requested miralax prn today to "stay regular"

## 2013-12-29 NOTE — Progress Notes (Addendum)
Nelva Naynthony Tarnow is a 58 y.o. male 06/14/1955 [id number]  Subjective: No new complaints. No new problems. Slept well. Feeling OK.  Objective: Vital signs in last 24 hours: Temp:  [98.1 F (36.7 C)-98.9 F (37.2 C)] 98.1 F (36.7 C) (08/08 0550) Pulse Rate:  [71-79] 71 (08/08 0550) Resp:  [16-17] 16 (08/08 0550) BP: (145-156)/(91-101) 156/101 mmHg (08/08 0550) SpO2:  [96 %-100 %] 100 % (08/08 0550) Weight change:  Last BM Date: 12/28/13  Intake/Output from previous day: 08/07 0701 - 08/08 0700 In: 840 [P.O.:840] Out: -  Last cbgs: CBG (last 3)  No results found for this basename: GLUCAP,  in the last 72 hours   Physical Exam General: No apparent distress   HEENT: not dry Lungs: Normal effort. Lungs clear to auscultation, no crackles or wheezes. Cardiovascular: Regular rate and rhythm, no edema Abdomen: S/NT/ND; BS(+) Musculoskeletal:  unchanged Neurological: No new neurological deficits Wounds: N/A    Skin: clear  Aging changes Mental state: Alert, oriented, cooperative    Lab Results: BMET    Component Value Date/Time   NA 141 12/28/2013 0850   K 4.5 12/28/2013 0850   CL 100 12/28/2013 0850   CO2 28 12/28/2013 0850   GLUCOSE 122* 12/28/2013 0850   BUN 16 12/28/2013 0850   CREATININE 1.04 12/28/2013 0850   CALCIUM 9.1 12/28/2013 0850   GFRNONAA 77* 12/28/2013 0850   GFRAA 89* 12/28/2013 0850   CBC    Component Value Date/Time   WBC 5.5 12/28/2013 0850   RBC 5.24 12/28/2013 0850   HGB 14.9 12/28/2013 0850   HCT 44.3 12/28/2013 0850   PLT 257 12/28/2013 0850   MCV 84.5 12/28/2013 0850   MCH 28.4 12/28/2013 0850   MCHC 33.6 12/28/2013 0850   RDW 13.2 12/28/2013 0850   LYMPHSABS 1.4 12/28/2013 0850   MONOABS 0.4 12/28/2013 0850   EOSABS 0.1 12/28/2013 0850   BASOSABS 0.0 12/28/2013 0850    Studies/Results: No results found.  Medications: I have reviewed the patient's current medications.  Assessment/Plan:  1. Left lenticular/left corona radiata thrombotic infarct. See Rx 2. HTN. Cont NAS  diet. May need to start on Rx 3. DVT prophylaxis. 3. Bowel program 4. Dyslipidemia. On Simvastatin     Length of stay, days: 2  Sonda PrimesAlex Plotnikov , MD 12/29/2013, 9:47 AM

## 2013-12-30 ENCOUNTER — Inpatient Hospital Stay (HOSPITAL_COMMUNITY): Payer: BC Managed Care – PPO | Admitting: Physical Therapy

## 2013-12-30 ENCOUNTER — Inpatient Hospital Stay (HOSPITAL_COMMUNITY): Payer: BC Managed Care – PPO | Admitting: Occupational Therapy

## 2013-12-30 MED ORDER — LOSARTAN POTASSIUM 50 MG PO TABS
50.0000 mg | ORAL_TABLET | Freq: Every day | ORAL | Status: DC
  Administered 2013-12-30 – 2014-01-17 (×19): 50 mg via ORAL
  Filled 2013-12-30 (×21): qty 1

## 2013-12-30 NOTE — Progress Notes (Signed)
Physical Therapy Session Note  Patient Details  Name: [name] MRN: [id number] Date of Birth: 11/08/1955  Today's Date: 12/30/2013 Time: 1400-1440 Time Calculation (min): 40 min  Short Term Goals: Week 1:  PT Short Term Goal 1 (Week 1): Pt will perform bed mobility with mod I via log roll technique. PT Short Term Goal 2 (Week 1): Pt will transfer bed <> w/c with supervision.  PT Short Term Goal 3 (Week 1): Pt will propel w/c using L hemi technique with mod I.  PT Short Term Goal 4 (Week 1): Pt will ambulate with max A x 1.  PT Short Term Goal 5 (Week 1): Pt will negotiate 3 stairs using 1 rail and max A x 1.  Skilled Therapeutic Interventions/Progress Updates:   Pt received seated in recliner, agreeable to therapy. Arm rest lowered and patient performed squat pivot transfer recliner > w/c with min A. W/c mobility using L hemi technique x 150 ft, mod I. Pt performed squat pivot transfer training w/c <> mat to R and L, verbal cues for technique and sequencing. Sit <> stand from mat with verbal cues for hand placement and anterior weightshift, mod progressed to min A. Gait training x 40 ft with R AFO and hemiwalker mod A, verbal cues for technique and lateral weightshift L for improved R foot clearance. For RLE NMR, pt performed step ups to 2" step with LLE with emphasis and tactile cues for R knee ext stability. Pt performed pre gait using L hemi walker with stepping L foot fwd/back, focus on RLE extension. Pt performed mini squats with min A for facilitating R knee ext. Pt left in w/c to propel to day room with mod I.  Therapy Documentation Precautions:  Precautions Precautions: Fall Precaution Comments: Dense R hemi Restrictions Weight Bearing Restrictions: No Pain:  Denied pain  See FIM for current functional status  Therapy/Group: Individual Therapy  [name], Rafael Quesada A 12/30/2013, 3:43 PM

## 2013-12-30 NOTE — Plan of Care (Signed)
Problem: RH KNOWLEDGE DEFICIT Goal: RH STG INCREASE KNOWLEDGE OF HYPERTENSION Pt will be able to explain medications ordered ( if appropriate; on hold for now) and dietary changes to decrease risk factors of stroke; low salt/l;ow fat/low chol food choices and rationale for changes with cues/prompts/handouts  Outcome: Progressing Discussed newly ordered cozaar for control of elevated BP.

## 2013-12-30 NOTE — Progress Notes (Signed)
Occupational Therapy Session Note  Patient Details  Name: [name] MRN: [id number] Date of Birth: 03/28/1956  Today's Date: 12/30/2013 Time: 0820-0930 Time Calculation (min): 70 min  Short Term Goals: Week 1:  OT Short Term Goal 1 (Week 1): Pt will perform grooming with set up A in order to increase I in self care OT Short Term Goal 2 (Week 1): Pt. will perform bathing with Min A at sinkside in order to increase I in self care. OT Short Term Goal 3 (Week 1): Pt will perform toilet transfer with Mod A in order to increase I in functional transfers. OT Short Term Goal 4 (Week 1): Pt will perform UB dressing with Min A utilizing hemiplegic technique in order to increase I with dressing.  OT Short Term Goal 5 (Week 1): Pt will perform LB dressing with Max A in order to increase I with self care.  Skilled Therapeutic Interventions/Progress Updates: ADL in room shower via tub transfer bench with focus on slowing down side scoots for transfers and proper foot placement and stability for safety before and during the transfers.  He completed lateral leans in showers for peri/buttock cleansing as the concern was slippery shower and non stable right knee for standing.  Completed hand over hand assist to use right upper extremity to wash left arm.   Dennie Bibleat was able to stand and sink and assist with pulling up pants while this clinician stablized his right knee.  Patient will benefit from long handled sponge and elastic laces.     Therapy Documentation Precautions:  Precautions Precautions: Fall Precaution Comments: Dense R hemi Restrictions Weight Bearing Restrictions: No  Pain: Pain Assessment Pain Score: 0-No pain :    See FIM for current functional status  Therapy/Group: Individual Therapy  Bud Faceickett, Adrinne Sze [facility] 12/30/2013, 10:02 AM

## 2013-12-30 NOTE — Progress Notes (Signed)
[name] is a 58 y.o. male 07/18/1955 [id number]  Subjective: No new complaints.  Slept well. Feeling OK.  Objective: Vital signs in last 24 hours: Temp:  [98 F (36.7 C)-98.9 F (37.2 C)] 98.1 F (36.7 C) (08/09 0606) Pulse Rate:  [66-86] 66 (08/09 0606) Resp:  [18-19] 18 (08/09 0606) BP: (159-186)/(92-96) 186/92 mmHg (08/09 0606) SpO2:  [97 %-100 %] 100 % (08/09 0606) Weight change:  Last BM Date: 12/28/13  Intake/Output from previous day: 08/08 0701 - 08/09 0700 In: 1080 [P.O.:1080] Out: -  Last cbgs: CBG (last 3)  No results found for this basename: GLUCAP,  in the last 72 hours   Physical Exam General: No apparent distress   HEENT: not dry Lungs: Normal effort. Lungs clear to auscultation, no crackles or wheezes. Cardiovascular: Regular rate and rhythm, no edema Abdomen: S/NT/ND; BS(+) Musculoskeletal:  unchanged Neurological: No new neurological deficits Wounds: N/A    Skin: clear  Aging changes Mental state: Alert, oriented, cooperative    Lab Results: BMET    Component Value Date/Time   NA 141 12/28/2013 0850   K 4.5 12/28/2013 0850   CL 100 12/28/2013 0850   CO2 28 12/28/2013 0850   GLUCOSE 122* 12/28/2013 0850   BUN 16 12/28/2013 0850   CREATININE 1.04 12/28/2013 0850   CALCIUM 9.1 12/28/2013 0850   GFRNONAA 77* 12/28/2013 0850   GFRAA 89* 12/28/2013 0850   CBC    Component Value Date/Time   WBC 5.5 12/28/2013 0850   RBC 5.24 12/28/2013 0850   HGB 14.9 12/28/2013 0850   HCT 44.3 12/28/2013 0850   PLT 257 12/28/2013 0850   MCV 84.5 12/28/2013 0850   MCH 28.4 12/28/2013 0850   MCHC 33.6 12/28/2013 0850   RDW 13.2 12/28/2013 0850   LYMPHSABS 1.4 12/28/2013 0850   MONOABS 0.4 12/28/2013 0850   EOSABS 0.1 12/28/2013 0850   BASOSABS 0.0 12/28/2013 0850    Studies/Results: No results found.  Medications: I have reviewed the patient's current medications.  Assessment/Plan:  1. Left lenticular/left corona radiata thrombotic infarct. See Rx 2. HTN. Cont NAS diet. Start  Losartan 3. DVT prophylaxis. 3. Bowel program 4. Dyslipidemia. On Simvastatin     Length of stay, days: 3  Sonda PrimesAlex Plotnikov , MD 12/30/2013, 9:03 AM

## 2013-12-31 ENCOUNTER — Encounter (HOSPITAL_COMMUNITY): Payer: BC Managed Care – PPO | Admitting: Occupational Therapy

## 2013-12-31 ENCOUNTER — Encounter (HOSPITAL_COMMUNITY): Payer: BC Managed Care – PPO

## 2013-12-31 ENCOUNTER — Inpatient Hospital Stay (HOSPITAL_COMMUNITY): Payer: BC Managed Care – PPO | Admitting: Speech Pathology

## 2013-12-31 ENCOUNTER — Inpatient Hospital Stay (HOSPITAL_COMMUNITY): Payer: BC Managed Care – PPO | Admitting: Physical Therapy

## 2013-12-31 DIAGNOSIS — I10 Essential (primary) hypertension: Secondary | ICD-10-CM

## 2013-12-31 DIAGNOSIS — I6330 Cerebral infarction due to thrombosis of unspecified cerebral artery: Secondary | ICD-10-CM

## 2013-12-31 NOTE — Progress Notes (Signed)
Physical Therapy Session Note  Patient Details  Name: [name] MRN: [id number] Date of Birth: 09/29/1955  Today's Date: 12/31/2013 Time: 0830-0930 Time Calculation (min): 60 min  Short Term Goals: Week 1:  PT Short Term Goal 1 (Week 1): Pt will perform bed mobility with mod I via log roll technique. PT Short Term Goal 2 (Week 1): Pt will transfer bed <> w/c with supervision.  PT Short Term Goal 3 (Week 1): Pt will propel w/c using L hemi technique with mod I.  PT Short Term Goal 4 (Week 1): Pt will ambulate with max A x 1.  PT Short Term Goal 5 (Week 1): Pt will negotiate 3 stairs using 1 rail and max A x 1.  Skilled Therapeutic Interventions/Progress Updates:   Pt received semi reclined in bed, agreeable to therapy. Pt transferred supine > sit with HOB flat to R side, vc's for technique and min A to initiate sidelying > sit on hemiparetic side. Pt performed squat pivot transfer bed > w/c and w/c <> mat with overall min guard-min A. Pt performed dynamic standing balance with focus on weightshift to L for upright posture, multidirectional reaching with LUE to grab horseshoe and place anterior and to L on basketball rim/remove from rim, occasional LUE support on hemiwalker and close supervision-min A, pt able to recover with LOB to R and min A. In supine pt performed RLE NMR for hip and knee flexion/extension and hip abduction/adduction with maxislide to decrease friction and bridging with therapist stabilizing RLE. Pt with no active DF/PF noted. Gait training x 40 ft using hemiwalker and R AFO, min-mod A and mirror anterior to facilitate upright posture and forward gaze. Pt demo compensatory strategies of L lateral trunk lean and RLE circumduction to achieve RLE advancement. Pt negotiated up/down 3 stairs using R rail and min-mod A, vc's for sequencing. At bottom of stairs, while patient transitioning from hand rail to hemiwalker for UE support, patient experienced posterior LOB resulting in  patient being assisted to sit on stairs. Pt did not hit head and denied any pain, see vitals below. RN alerted. Pt assisted back to standing with +2 A and performed stand pivot transfer back to w/c, min A. W/c mobility using L hemi technique back to room, mod I. Pt left sitting in w/c for SLP session.   Therapy Documentation Precautions:  Precautions Precautions: Fall Precaution Comments: Dense R hemi Restrictions Weight Bearing Restrictions: No Vital Signs:  170/99, HR 110 in seated position after stair negotiation  Pain: Pain Assessment Pain Assessment: No/denies pain  See FIM for current functional status  Therapy/Group: Individual Therapy  [name], Jarquis Walker A 12/31/2013, 12:05 PM

## 2013-12-31 NOTE — Progress Notes (Signed)
Speech Language Pathology Daily Session Note  Patient Details  Name: [name] MRN: [id number] Date of Birth: March 03, 1956  Today's Date: 12/31/2013 Time: 4098-1191 Time Calculation (min): 60 min  Short Term Goals: Week 1: SLP Short Term Goal 1 (Week 1): Patient will utilize speech intelligibility strategies at the sentence level during structured tasks with Supervision cues. SLP Short Term Goal 2 (Week 1): Patient will self-monitor and correct errors with Min verbal and non-vebral cues. SLP Short Term Goal 3 (Week 1): Patient will identify effective speech intelligibility strategies with Mod I. SLP Short Term Goal 4 (Week 1): Patient will complete oral motor exercises with Mod I .  Skilled Therapeutic Interventions: Skilled treatment session focused on addressing speech and self-care/home management goals. Patient returned demonstration of oral motor exercises with use of handout and identified effective speech intelligibility strategies with increased time; overall Mod I.  During structured speech task patient Mod I for carryover of slow rate of speech as well as over articulation.  Patient required intermittent cues to self-monitor and correct fast rate of speech during conversation.  After listening to himself from an audio recording patient verbalized that he sounds like himself and that family and friends have no difficulty understanding him.  Patient with knowledge of oral motor exercises as well as strategies and able to carryover this information; as a result, there is no further need for skilled SLP services at this time.  Patient agreeable to discharge at this time.    FIM:  Comprehension Comprehension Mode: Auditory Comprehension: 6-Follows complex conversation/direction: With extra time/assistive device Expression Expression Mode: Verbal Expression: 5-Expresses complex 90% of the time/cues < 10% of the time Social Interaction Social Interaction: 7-Interacts appropriately with  others - No medications needed. Problem Solving Problem Solving: 6-Solves complex problems: With extra time Memory Memory: 6-Assistive device: No helper  Pain Pain Assessment Pain Assessment: No/denies pain  Therapy/Group: Individual Therapy  Charlane Ferretti., CCC-SLP [phone]  Ramell Wacha 12/31/2013, 11:43 AM

## 2013-12-31 NOTE — Progress Notes (Signed)
Occupational Therapy Session Note  Patient Details  Name: [name] MRN: [id number] Date of Birth: 12/26/1955  Today's Date: 12/31/2013 Time: 1102-1203 Time Calculation (min): 61 min  Short Term Goals: Week 1:  OT Short Term Goal 1 (Week 1): Pt will perform grooming with set up A in order to increase I in self care OT Short Term Goal 2 (Week 1): Pt. will perform bathing with Min A at sinkside in order to increase I in self care. OT Short Term Goal 3 (Week 1): Pt will perform toilet transfer with Mod A in order to increase I in functional transfers. OT Short Term Goal 4 (Week 1): Pt will perform UB dressing with Min A utilizing hemiplegic technique in order to increase I with dressing.  OT Short Term Goal 5 (Week 1): Pt will perform LB dressing with Max A in order to increase I with self care.  Skilled Therapeutic Interventions/Progress Updates:  Patient received seated in w/c eager to work with therapist. Patient with no complaints of pain. Patient propelled self into bathroom for transfer onto tub transfer bench. UB/LB bathing completed in seated position, patient performing lateral leans for peri care. Patient dried, then transferred back to w/c. UB/LB dressing completed in sit<>stand position from w/c.Therapist administered elastic shoe strings to increase independence with donning/doffing of bilateral shoes. Focused skilled intervention on ADL retraining, functional transfers, sit<>stands, dynamic standing balance/tolerance/endurance, safety during ADL, and overall activity tolerance/endurance. Patient tends to perform tasks in a quick manner, but no impulsive or unsafe behaviors noted during this OT session. Therapist also educated patient on RUE AAROM/strengthening exercises and wrote down exercises for patient. Encouraged patient to perform these exercises a few times a day. At end of session, left patient seated in w/c with hemi tray and all needs within reach.   Precautions:   Precautions Precautions: Fall Precaution Comments: Dense R hemi Restrictions Weight Bearing Restrictions: No  See FIM for current functional status  Therapy/Group: Individual Therapy  Jamarria Real 12/31/2013, 12:15 PM

## 2013-12-31 NOTE — Progress Notes (Signed)
Pt. Returned from therapy.  Therapist, Bayard Huggerebecca Varner, informed me that while the patient was walking the stairs, he had to sit down on the stairs.  Per therapist, pt. Vitals stable, no injury.  Assessed pt., no complaints of obvious injury.  No needs expressed.  Peri MarisAndrew Aslyn Cottman, MBA, BS, RN

## 2013-12-31 NOTE — IPOC Note (Addendum)
Overall Plan of Care [facility](IPOC) Patient Details Name: [name] MRN: [id number] DOB: 01/18/1956  Admitting Diagnosis: CVA  Hospital Problems: Active Problems:   CVA (cerebral infarction)     Functional Problem List: Nursing Safety;Sensory;Edema;Endurance;Medication Management;Motor;Bowel  PT Balance;Endurance;Motor;Pain;Perception;Safety;Sensory;Skin Integrity  OT Balance;Endurance;Motor;Safety;Other (Comment) (self care)  SLP Linguistic  TR Activity tolerance, functional mobility, balance,safety       Basic ADL's: OT Grooming;Bathing;Dressing;Toileting     Advanced  ADL's: OT       Transfers: PT Bed Mobility;Bed to Chair;Car;Furniture  OT Toilet;Tub/Shower     Locomotion: PT Ambulation;Wheelchair Mobility;Stairs     Additional Impairments: OT Fuctional Use of Upper Extremity  SLP Communication expression    TR      Anticipated Outcomes Item Anticipated Outcome  Self Feeding    Swallowing      Basic self-care  min A  Toileting  Min A   Bathroom Transfers Min A  Bowel/Bladder  manage bladder with level 5 assist and bowel with Mod I  Transfers  mod I  Locomotion  supervision to min A  Communication  Mod I   Cognition     Pain  n/a  Safety/Judgment  Maintain safety with supervision/cues   Therapy Plan: PT Intensity: Minimum of 1-2 x/day ,45 to 90 minutes PT Frequency: 5 out of 7 days PT Duration Estimated Length of Stay: 3 weeks OT Intensity: Minimum of 1-2 x/day, 45 to 90 minutes OT Frequency: 5 out of 7 days OT Duration/Estimated Length of Stay: 3 weeks SLP Intensity: Minumum of 1-2 x/day, 30 to 90 minutes SLP Frequency: 3 out of 7 days SLP Duration/Estimated Length of Stay: 18 days  TR Duration/ELOS:  3 weeks TR Frequency:  Min 1 time per week >20 minutes        Team Interventions: Nursing Interventions Patient/Family Education;Medication Management;Discharge Planning;Bowel Management;Psychosocial Support;Disease Management/Prevention   PT interventions Ambulation/gait training;Balance/vestibular training;Discharge planning;DME/adaptive equipment instruction;Functional mobility training;Functional electrical stimulation;Neuromuscular re-education;Patient/family education;Psychosocial support;Stair training;Therapeutic Activities;Therapeutic Exercise;UE/LE Strength taining/ROM;UE/LE Coordination activities;Wheelchair propulsion/positioning;Community reintegration  OT Interventions Balance/vestibular training;Discharge planning;DME/adaptive equipment instruction;Psychosocial support;Therapeutic Activities;UE/LE Strength taining/ROM;UE/LE Coordination activities;Therapeutic Exercise;Self Care/advanced ADL retraining;Patient/family education;Neuromuscular re-education  SLP Interventions Cueing hierarchy;Environmental controls;Functional tasks;Internal/external aids;Oral motor exercises;Patient/family education;Speech/Language facilitation  TR Interventions Recreation/leisure participation, Balance/Vestibular training, functional mobility, therapeutic activities, UE/LE strength/coordination, w/c mobility, community reintegration, pt/family education, adaptive equipment instruction/use, discharge planning, psychosocial support  SW/CM Interventions Discharge Planning;Psychosocial Support;Patient/Family Education    Team Discharge Planning: Destination: PT-Home ,OT- Home , SLP-Home Projected Follow-up: PT-Outpatient PT, OT-  Home health OT (pt. request), SLP-None Projected Equipment Needs: PT-To be determined, OT- Tub/shower bench;3 in 1 bedside comode, SLP-None recommended by SLP Equipment Details: PT-TBD upon d/c, OT-Pt reports owning no equipment Patient/family involved in discharge planning: PT- Patient,  OT-Patient, SLP-Patient  MD ELOS: ELOS: 16-22 days    Medical Rehab Prognosis:  Good Assessment: 58 year old RH-male with history of HTN (no meds > 1 year) who was admitted to [facility] on 08/03/015 with two day history of fluctuating RUE  weakness progressive to difficulty speaking and some swallowing issues. He reported being under a lot of stress, working 16 days straight and family forced him to seek medical assistance. UDS negative. TSH- 2.54. Hgb A1c- 601. CT head with recent left lenticulostriate infarct  Now requiring 24/7 Rehab RN,MD, as well as CIR level PT, OT .  Treatment team will focus on ADLs and mobility with goals set at Sup/Min A   See Team Conference Notes for weekly updates to the plan of care

## 2013-12-31 NOTE — Progress Notes (Signed)
Speech Language Pathology Discharge Summary  Patient Details  Name: [name] MRN: [id number] Date of Birth: 05-Jul-1955  Today's Date: 12/31/2013  Patient has met 1 of 1 long term goals.  Patient to discharge at overall Modified Independent;Supervision (patient at baseline per his report ) level.  Reasons goals not met:  N/A  Clinical Impression/Discharge Summary:  Patient has made functional gains and has met 1 of 1 long term goals this admission due to improved speech intelligibility. Patient is currently an overall Mod I with occasional Supervision to self-monitor rate of speech which patient reports is his baseline.  Patient education complete and does not need further skilled SLP services.  Patient agreeable to discharge reporting that his speech has returned to baseline.     Care Partner:  Caregiver Able to Provide Assistance:  (N/A)    Recommendation:  None     Equipment: none   Reasons for discharge: Treatment goals met   Patient/Family Agrees with Progress Made and Goals Achieved: Yes   See FIM for current functional status  Carmelia Roller., CCC-SLP [phone]  [place] 12/31/2013, 11:51 AM

## 2013-12-31 NOTE — Progress Notes (Signed)
58 year old RH-male with history of HTN (no meds > 1 year) who was admitted to [facility] on 08/03/015 with two day history of fluctuating RUE weakness progressive to difficulty speaking and some swallowing issues. He reported being under a lot of stress, working 16 days straight and family forced him to seek medical assistance. UDS negative. TSH- 2.54. Hgb A1c- 601. CT head with recent left lenticulostriate infarct  Subjective/Complaints: Pt denies bowel or bladder issues, good appetite Review of Systems - Negative except R side weak Objective: Vital Signs: Blood pressure 152/88, pulse 77, temperature 98.3 F (36.8 C), temperature source Oral, resp. rate 16, height $RemoveBe'5\' 11"'IykVhczau$  (1.803 m), weight 98.3 kg (216 lb 11.4 oz), SpO2 99.00%. No results found. Results for orders placed during the hospital encounter of 12/27/13 (from the past 72 hour(s))  COMPREHENSIVE METABOLIC PANEL     Status: Abnormal   Collection Time    12/28/13  8:50 AM      Result Value Ref Range   Sodium 141  137 - 147 mEq/L   Potassium 4.5  3.7 - 5.3 mEq/L   Chloride 100  96 - 112 mEq/L   CO2 28  19 - 32 mEq/L   Glucose, Bld 122 (*) 70 - 99 mg/dL   BUN 16  6 - 23 mg/dL   Creatinine, Ser 1.04  0.50 - 1.35 mg/dL   Calcium 9.1  8.4 - 10.5 mg/dL   Total Protein 7.5  6.0 - 8.3 g/dL   Albumin 3.6  3.5 - 5.2 g/dL   AST 22  0 - 37 U/L   ALT 27  0 - 53 U/L   Alkaline Phosphatase 75  39 - 117 U/L   Total Bilirubin 0.4  0.3 - 1.2 mg/dL   GFR calc non Af Amer 77 (*) >90 mL/min   GFR calc Af Amer 89 (*) >90 mL/min   Comment: (NOTE)     The eGFR has been calculated using the CKD EPI equation.     This calculation has not been validated in all clinical situations.     eGFR's persistently <90 mL/min signify possible Chronic Kidney     Disease.   Anion gap 13  5 - 15  CBC WITH DIFFERENTIAL     Status: None   Collection Time    12/28/13  8:50 AM      Result Value Ref Range   WBC 5.5  4.0 - 10.5 K/uL   RBC 5.24  4.22 - 5.81 MIL/uL   Hemoglobin 14.9  13.0 - 17.0 g/dL   HCT 44.3  39.0 - 52.0 %   MCV 84.5  78.0 - 100.0 fL   MCH 28.4  26.0 - 34.0 pg   MCHC 33.6  30.0 - 36.0 g/dL   RDW 13.2  11.5 - 15.5 %   Platelets 257  150 - 400 K/uL   Neutrophils Relative % 65  43 - 77 %   Neutro Abs 3.6  1.7 - 7.7 K/uL   Lymphocytes Relative 25  12 - 46 %   Lymphs Abs 1.4  0.7 - 4.0 K/uL   Monocytes Relative 7  3 - 12 %   Monocytes Absolute 0.4  0.1 - 1.0 K/uL   Eosinophils Relative 2  0 - 5 %   Eosinophils Absolute 0.1  0.0 - 0.7 K/uL   Basophils Relative 1  0 - 1 %   Basophils Absolute 0.0  0.0 - 0.1 K/uL     HEENT: normal Cardio: RRR  Resp: CTA B/L and unlabored GI: BS positive and NT,ND Extremity:  Pulses positive and No Edema Skin:   Intact Neuro: Alert/Oriented, Normal Sensory and Abnormal Motor trace biceps otherwise 0/5 in RUE,2- hip/knee ext synergy Musc/Skel:  Normal Gen NAD   Assessment/Plan: 1. 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 Physiatrist is providing close team supervision and 24 hour management of active medical problems listed below. Physiatrist and rehab team continue to assess barriers to discharge/monitor patient progress toward functional and medical goals. FIM: FIM - Bathing Bathing Steps Patient Completed: Chest;Right Arm;Abdomen;Front perineal area;Buttocks;Right upper leg;Right lower leg (including foot) Bathing: 3: Mod-Patient completes 5-7 8f 10 parts or 50-74%  FIM - Upper Body Dressing/Undressing Upper body dressing/undressing steps patient completed: Thread/unthread right sleeve of pullover shirt/dresss;Pull shirt over trunk;Thread/unthread left sleeve of pullover shirt/dress;Put head through opening of pull over shirt/dress Upper body dressing/undressing: 5: Set-up assist to: Obtain clothing/put away FIM - Lower Body Dressing/Undressing Lower body  dressing/undressing steps patient completed: Thread/unthread left underwear leg;Thread/unthread left pants leg;Don/Doff left sock Lower body dressing/undressing: 2: Max-Patient completed 25-49% of tasks  FIM - Toileting Toileting steps completed by patient: Performs perineal hygiene Toileting Assistive Devices: Grab bar or rail for support Toileting: 2: Max-Patient completed 1 of 3 steps  FIM - Radio producer Devices: Elevated toilet seat;Grab bars Toilet Transfers: 3-To toilet/BSC: Mod A (lift or lower assist);3-From toilet/BSC: Mod A (lift or lower assist)  FIM - Bed/Chair Transfer Bed/Chair Transfer Assistive Devices: Arm rests Bed/Chair Transfer: 4: Bed > Chair or W/C: Min A (steadying Pt. > 75%);4: Chair or W/C > Bed: Min A (steadying Pt. > 75%)  FIM - Locomotion: Wheelchair Distance: 150 Locomotion: Wheelchair: 6: Travels 150 ft or more, turns around, maneuvers to table, bed or toilet, negotiates 3% grade: maneuvers on rugs and over door sills independently FIM - Locomotion: Ambulation Locomotion: Ambulation Assistive Devices: Museum/gallery curator Ambulation/Gait Assistance: 3: Mod assist Locomotion: Ambulation: 1: Travels less than 50 ft with moderate assistance (Pt: 50 - 74%)  Comprehension Comprehension Mode: Auditory Comprehension: 5-Follows basic conversation/direction: With no assist  Expression Expression Mode: Verbal Expression: 5-Expresses basic needs/ideas: With no assist  Social Interaction Social Interaction: 7-Interacts appropriately with others - No medications needed.  Problem Solving Problem Solving: 6-Solves complex problems: With extra time  Memory Memory: 6-More than reasonable amt of time  Medical Problem List and Plan:  1. 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  2. DVT Prophylaxis/Anticoagulation: Pharmaceutical: Lovenox. Will check dopplers to rule out DVT.  3. Pain  Management: N/A  4. Mood: Motivated to get better. Will have LCSW follow for evaluation and support.  5. Neuropsych: This patient is capable of making decisions on his own behalf.  6. Skin/Wound Care: Monitor skin with routine checks. Maintain adequate hydration and nutritional status.  7. HTN: Will monitor BP every 8 hours. Avoid hypotension to allow adequate perfusion.  8. Dyslipidemia: Continue zocor.    LOS (Days) 4 A FACE TO FACE EVALUATION WAS PERFORMED  [name] E 12/31/2013, 6:29 AM

## 2014-01-01 ENCOUNTER — Inpatient Hospital Stay (HOSPITAL_COMMUNITY): Payer: BC Managed Care – PPO

## 2014-01-01 ENCOUNTER — Inpatient Hospital Stay (HOSPITAL_COMMUNITY): Payer: BC Managed Care – PPO | Admitting: *Deleted

## 2014-01-01 DIAGNOSIS — I10 Essential (primary) hypertension: Secondary | ICD-10-CM

## 2014-01-01 DIAGNOSIS — I6330 Cerebral infarction due to thrombosis of unspecified cerebral artery: Secondary | ICD-10-CM

## 2014-01-01 NOTE — Progress Notes (Addendum)
Occupational Therapy Session Note  Patient Details  Name: [name] MRN: [id number] Date of Birth: 03/10/1956  Today's Date: 01/01/2014 Time: 0830-0930 Time Calculation (min): 60 min  Short Term Goals: Week 1:  OT Short Term Goal 1 (Week 1): Pt will perform grooming with set up A in order to increase I in self care OT Short Term Goal 2 (Week 1): Pt. will perform bathing with Min A at sinkside in order to increase I in self care. OT Short Term Goal 3 (Week 1): Pt will perform toilet transfer with Mod A in order to increase I in functional transfers. OT Short Term Goal 4 (Week 1): Pt will perform UB dressing with Min A utilizing hemiplegic technique in order to increase I with dressing.  OT Short Term Goal 5 (Week 1): Pt will perform LB dressing with Max A in order to increase I with self care.  Skilled Therapeutic Interventions/Progress Updates: ADL-retraining with focus on transfers, safety awareness, w/c positioning, and hemi dressing skills reinforcement.    Pt received seated at edge of bed and enthusiastic for continued therapy.   With setup and min guard assist pt completed transfer to w/c with supervision and to tub bench using grab bar and min instructional cue to progress to questioning cues for technique and steadying assist for safety.   Pt completed shower seated and transferred to w/c then to edge of bed to complete dressing, unsupported.  Pt demo's good carry-over of hemi dressing skills for upper body but was unable to incorporate lateral leans to don pants and required assist for steadying while standing to allow assist for lower body dressing.   Pt returned to w/c at end of session with call light and phone placed within reach.    Therapy Documentation Precautions:  Precautions Precautions: Fall Precaution Comments: Dense R hemi Restrictions Weight Bearing Restrictions: No  Pain: Pain Assessment Pain Assessment: No/denies pain Pain Score: 0-No pain  See FIM for  current functional status  Second session: Time: 1300-1400 Time Calculation (min):  60 min  Pain Assessment: No/denies pain  Skilled Therapeutic Interventions: (30 min) NMR of R-UE with focus on weight-bearing, facilitation of right shoulder elevation/dression, internal/external rotation, elbow flexion/extension using tabletop UE skate, tendon-tapping (vibration stimulator).   (30 min) Therapeutic activity with focus on bilateral UE function to include functional reaching and self-ROM.   Pt sustained impressive effort throughout tasks but requires repeated vc to slow down and to reposition LE to maintain good sitting posture.   Pt requires hand-over-hand guidance to perform finger interlacing but retained skill after initial instruction.      See FIM for current functional status  Therapy/Group: Individual Therapy  Therapy/Group: Individual Therapy  [name] 01/01/2014, 10:53 AM

## 2014-01-01 NOTE — Progress Notes (Signed)
Recreational Therapy Assessment and Plan  Patient Details  Name: [name] MRN: [id number] Date of Birth: [birth year] Today's Date: 01/01/2014  Rehab Potential: Good ELOS: 3 weeks   Assessment Clinical Impression:  Problem List:  Patient Active Problem List    Diagnosis  Date Noted   .  CVA (cerebral infarction)  12/27/2013    Past Medical History: No past medical history on file.  Past Surgical History:  Past Surgical History   Procedure  Laterality  Date   .  Knee arthroscopy  Right     Assessment & Plan  Clinical Impression: [name] is a 58 year old RH-male with history of HTN (no meds > 1 year) who was admitted to [facility] on 08/03/015 with two day history of fluctuating RUE weakness progressive to difficulty speaking and some swallowing issues. He reported being under a lot of stress, working 16 days straight and family forced him to seek medical assistance. UDS negative. TSH- 2.54. Hgb A1c- 601. CT head with recent left lenticulostriate infarct. He was started on ASA for treatment and blood pressure noted to be labile and treated with hydralazine. He had worsening of symptoms with dense right hemiparesis that evening and follow up CCT with left basal ganglia infarct and no significant change. Neurology (Dr. Irish Elders) consulted for input and felt that patient with worsening of symptoms likely due to hypotension. MRI brain with acute nonhemorrhagic infarct left lenticular nucleus and mid corona radiata and tiny infarct anterior left frontal opercular region. 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 Carotid dopplers done revealing "minimal to moderate amount of bilateral arthrosclerotic plaque R>L not resulting in hemodynamically significant stenosis." 2D echo With EF 60-65%, moderate LVH, impaired LV relaxation and no cardiac SOE He continues on ASA for secondary stroke prevention of thrombotic CVA. Patient with subsequent right  facial droop with mild dysarthria and right hemiparesis. Therapy initiated and patient limited by right hemiparesis with impaired balance but very motivated. Patient transferred to [facility] on 12/27/2013.   Pt presents with decreased activity tolerance, decreased functional mobility, decreased balance, right sided weakness, dysarthria Limiting pt's independence with leisure/community pursuits.   Leisure History/Participation Premorbid leisure interest/current participation: Sports - Exercise (Comment);Sports - Other (Comment);Joretta Bachelor care;Community - Shopping mall;Community - Grocery store;Community - Travel (Comment) (cycling) Expression Interests: Music (Comment) Other Leisure Interests: Movies;Television Leisure Participation Style: Alone;With Family/Friends Awareness of Community Resources: Excellent Psychosocial / Spiritual Social interaction - Mood/Behavior: Cooperative Academic librarian Appropriate for Education?: Yes Patient Agreeable to Outing?: Yes Recreational Therapy Orientation Orientation -Reviewed with patient: Available activity resources Strengths/Weaknesses Patient Strengths/Abilities: Willingness to participate;Active premorbidly Patient weaknesses: Physical limitations TR Patient demonstrates impairments in the following area(s): Endurance;Motor;Safety  Plan Rec Therapy Plan Is patient appropriate for Therapeutic Recreation?: Yes Rehab Potential: Good Treatment times per week: Min 1 time per week >20 minutes Estimated Length of Stay: 3 weeks TR Treatment/Interventions: Adaptive equipment instruction;1:1 session;Balance/vestibular training;Functional mobility training;Community reintegration;Patient/family education;Therapeutic activities;Recreation/leisure participation;Therapeutic exercise;UE/LE Coordination activities  Recommendations for other services: None  Discharge Criteria: Patient will be discharged from TR if patient refuses treatment 3 consecutive  times without medical reason.  If treatment goals not met, if there is a change in medical status, if patient makes no progress towards goals or if patient is discharged from hospital.  The above assessment, treatment plan, treatment alternatives and goals were discussed and mutually agreed upon: by patient  Teller 01/01/2014, 2:47 PM

## 2014-01-01 NOTE — Progress Notes (Signed)
58 year old RH-male with history of HTN (no meds > 1 year) who was admitted to [facility] on 08/03/015 with two day history of fluctuating RUE weakness progressive to difficulty speaking and some swallowing issues. He reported being under a lot of stress, working 16 days straight and family forced him to seek medical assistance. UDS negative. TSH- 2.54. Hgb A1c- 601. CT head with recent left lenticulostriate infarct  Subjective/Complaints: Sleeping well, no bowel or bladder issues, good appetite Review of Systems - Negative except R side weak Objective: Vital Signs: Blood pressure 142/82, pulse 68, temperature 98.1 F (36.7 C), temperature source Oral, resp. rate 18, height 5\' 11"  (1.803 m), weight 98.3 kg (216 lb 11.4 oz), SpO2 99.00%. No results found. No results found for this or any previous visit (from the past 72 hour(s)).   HEENT: normal Cardio: RRR Resp: CTA B/L and unlabored GI: BS positive and NT,ND Extremity:  Pulses positive and No Edema Skin:   Intact Neuro: Alert/Oriented, Normal Sensory and Abnormal Motor trace biceps otherwise 0/5 in RUE,2- hip/knee ext synergy, 0/5 in R foot/ankle Musc/Skel:  Normal Gen NAD   Assessment/Plan: 1. 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 Physiatrist is providing close team supervision and 24 hour management of active medical problems listed below. Physiatrist and rehab team continue to assess barriers to discharge/monitor patient progress toward functional and medical goals. Team conf in am FIM: FIM - Bathing Bathing Steps Patient Completed: Chest;Right Arm;Abdomen;Front perineal area;Buttocks;Right upper leg;Right lower leg (including foot);Left upper leg;Left lower leg (including foot) Bathing: 4: Min-Patient completes 8-9 [id number] 10 parts or 75+ percent (and steady assist for RLE)  FIM - Upper Body  Dressing/Undressing Upper body dressing/undressing steps patient completed: Thread/unthread right sleeve of pullover shirt/dresss;Pull shirt over trunk;Thread/unthread left sleeve of pullover shirt/dress;Put head through opening of pull over shirt/dress Upper body dressing/undressing: 4: Min-Patient completed 75 plus % of tasks FIM - Lower Body Dressing/Undressing Lower body dressing/undressing steps patient completed: Thread/unthread left underwear leg;Thread/unthread left pants leg;Don/Doff left sock;Don/Doff right sock;Don/Doff left shoe Lower body dressing/undressing: 3: Mod-Patient completed 50-74% of tasks (crossing BLEs to don bilateral socks and with elastic shoe strings)  FIM - Toileting Toileting steps completed by patient: Performs perineal hygiene Toileting Assistive Devices: Grab bar or rail for support Toileting: 0: Activity did not occur  FIM - Diplomatic Services operational officerToilet Transfers Toilet Transfers Assistive Devices: Elevated toilet seat;Grab bars Toilet Transfers: 0-Activity did not occur  FIM - BankerBed/Chair Transfer Bed/Chair Transfer Assistive Devices: Arm rests Bed/Chair Transfer: 4: Bed > Chair or W/C: Min A (steadying Pt. > 75%);4: Chair or W/C > Bed: Min A (steadying Pt. > 75%);4: Supine > Sit: Min A (steadying Pt. > 75%/lift 1 leg);5: Sit > Supine: Supervision (verbal cues/safety issues)  FIM - Locomotion: Wheelchair Distance: 150 Locomotion: Wheelchair: 6: Travels 150 ft or more, turns around, maneuvers to table, bed or toilet, negotiates 3% grade: maneuvers on rugs and over door sills independently FIM - Locomotion: Ambulation Locomotion: Ambulation Assistive Devices: Chief Operating OfficerWalker - Hemi Ambulation/Gait Assistance: 4: Min assist;3: Mod assist Locomotion: Ambulation: 1: Travels less than 50 ft with moderate assistance (Pt: 50 - 74%)  Comprehension Comprehension Mode: Auditory Comprehension: 6-Follows complex conversation/direction: With extra time/assistive device  Expression Expression  Mode: Verbal Expression: 6-Expresses complex ideas: With extra time/assistive device  Social Interaction Social Interaction: 7-Interacts appropriately with others - No medications needed.  Problem Solving Problem Solving: 6-Solves complex problems:  With extra time  Memory Memory: 6-Assistive device: No helper  Medical Problem List and Plan:  1. 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  2. DVT Prophylaxis/Anticoagulation: Pharmaceutical: Lovenox. Will check dopplers to rule out DVT.  3. Pain Management: N/A  4. Mood: Motivated to get better. Will have LCSW follow for evaluation and support.  5. Neuropsych: This patient is capable of making decisions on his own behalf.  6. Skin/Wound Care: Monitor skin with routine checks. Maintain adequate hydration and nutritional status.  7. HTN: Will monitor BP every 8 hours. Avoid hypotension to allow adequate perfusion.  8. Dyslipidemia: Continue zocor.    LOS (Days) 5 A FACE TO FACE EVALUATION WAS PERFORMED  KIRSTEINS,ANDREW E 01/01/2014, 6:51 AM

## 2014-01-01 NOTE — Consult Note (Signed)
  INITIAL DIAGNOSTIC EVALUATION - [facility]   MEDICAL NECESSITY:  [name] for an initial diagnostic evaluation owing to the patient's diagnosis of stroke.   According to medical records, [name] was admitted to the rehab unit owing to functional deficits secondary to left lenticular/left corona radiata thrombotic infarct. He has a history of HTN (no meds > 1 year) who was admitted on 08/03/015 with a two-day history of fluctuating RUE weakness, difficulty speaking and mild swallowing issues. He reported being under a lot of stress and was working 16 days straight when his family forced him to seek medical assistance. UDS was negative. CT head scan reportedly revealed recent left lenticulostriate infarct. He was started on ASA for treatment and blood pressure noted to be labile and treated with hydralazine. He had worsening of symptoms with dense right hemiparesis and follow-up CCT showed left basal ganglia infarct. A brain MRI scan showed an acute non-hemorrhagic infarct in the left lenticular nucleus and mid corona radiata and tiny infarct in anterior left frontal opercular region. Patient suffers subsequent right facial droop with mild dysarthria and right hemiparesis.   During today's visit, [name] denied suffering from any major cognitive deficits post-stroke. He admitted to mildly slow processing speed and mild forgetfulness. His speech is somewhat dysarthric and his right-side is affected motorically. From an emotional standpoint, [name] denied suffering from any major signs of clinical psychopathology. He admitted to being somewhat "down" about the fact that he had a stroke, but this is within a range of what would be considered a normal reaction to a bad situation. He is adjusting well despite his present medical situation and he reported being in generally good spirits. No barriers  to therapy identified. His wife is a great source of support. Patient has no history of mental health treatment.  Suicidal/homicidal ideation, plan or intent was denied. No manic or hypomanic episodes were reported. The patient denied ever experiencing any auditory/visual hallucinations. No major behavioral or personality changes were endorsed.   PROCEDURES ADMINISTERED: [1 unit [id number] on 12/31/13] Diagnostic clinical interview  Review of available records  Behavioral Evaluation: [name] was appropriately dressed for season and situation, and he appeared tidy and well-groomed. Normal posture was noted. He was friendly and rapport was easily established. His speech was mildly dysarthric but he was able to express ideas effectively. His affect was appropriately modulated. Attention and motivation were good.    Overall, [name] denied suffering from any major cognitive or emotional symptoms post-stroke. He is adjusting well to this admission and has adequate support. He is highly motivated to participate and get well. I think his prognosis is excellent. No formal follow-up will be scheduled at this time. He was encouraged to call upon our service if needed.     DIAGNOSIS:  Cerebral infarction   Debbe MountsAdam T. Hena Ewalt, Psy.D.  Clinical Neuropsychologist

## 2014-01-01 NOTE — Progress Notes (Signed)
Physical Therapy Session Note  Patient Details  Name: [name] MRN: [id number] Date of Birth: 02/10/1956  Today's Date: 01/01/2014 Time: 1030-1130 Time Calculation (min): 60 min  Short Term Goals: Week 1:  PT Short Term Goal 1 (Week 1): Pt will perform bed mobility with mod I via log roll technique. PT Short Term Goal 2 (Week 1): Pt will transfer bed <> w/c with supervision.  PT Short Term Goal 3 (Week 1): Pt will propel w/c using L hemi technique with mod I.  PT Short Term Goal 4 (Week 1): Pt will ambulate with max A x 1.  PT Short Term Goal 5 (Week 1): Pt will negotiate 3 stairs using 1 rail and max A x 1.  Skilled Therapeutic Interventions/Progress Updates:   Skilled co-treat with RT first half of session. W/c mobility using L hemi technique, mod I, x 150 ft. Pt instructed in squat pivot transfer for improved independence with S-min A and cues for technique after attempting to perform stand pivot transfer without AD. NMR for dynamic standing balance with patient performing sit <> stand from mat with min A progressed to close supervision without AD and cues for increased control with decreased speed, reaching outside BOS for horseshoes and tossing horseshoes with close supervision. Pt with LOB x 3 able to recover with min-mod A and pt demo appropriate balance reactions including stepping with LLE and reaching for hemiwalker. Pt performed tall kneeling on mat table with WB through RUE on bench placed anterior, performed ring toss to target with verbal/tactile cues for hip extension to neutral. Pt required assist for RLE and RUE to achieve tall kneeling position. Gait training 2 x 40 ft using hemiwalker and R ankle DF wrap assist with overall min A. Pt performed bed mobility on mat to L side, multiple trials until patient able to successfully sequence with overall supervision. Pt left sitting in w/c to return to room.   Therapy Documentation Precautions:  Precautions Precautions:  Fall Precaution Comments: Dense R hemi Restrictions Weight Bearing Restrictions: No Pain: Pain Assessment Pain Assessment: No/denies pain Pain Score: 0-No pain  See FIM for current functional status  Therapy/Group: Individual Therapy  [name], Trenell Moxey A 01/01/2014, 11:49 AM

## 2014-01-02 ENCOUNTER — Inpatient Hospital Stay (HOSPITAL_COMMUNITY): Payer: BC Managed Care – PPO | Admitting: Occupational Therapy

## 2014-01-02 ENCOUNTER — Inpatient Hospital Stay (HOSPITAL_COMMUNITY): Payer: BC Managed Care – PPO | Admitting: Physical Therapy

## 2014-01-02 NOTE — Progress Notes (Signed)
Occupational Therapy Session Note  Patient Details  Name: [name] MRN: [id number] Date of Birth: 06/29/1955  Today's Date: 01/02/2014 Time: [id number] and 1257 - 1357 Time Calculation (min): 60 min and 60 min  Short Term Goals: Week 1:  OT Short Term Goal 1 (Week 1): Pt will perform grooming with set up A in order to increase I in self care OT Short Term Goal 2 (Week 1): Pt. will perform bathing with Min A at sinkside in order to increase I in self care. OT Short Term Goal 3 (Week 1): Pt will perform toilet transfer with Mod A in order to increase I in functional transfers. OT Short Term Goal 4 (Week 1): Pt will perform UB dressing with Min A utilizing hemiplegic technique in order to increase I with dressing.  OT Short Term Goal 5 (Week 1): Pt will perform LB dressing with Max A in order to increase I with self care.  Skilled Therapeutic Interventions/Progress Updates:  Session 1 [phone]([id number])- Upon entering the room, pt supine in bed in dark room. Pt with 2/10 pain reported as "soreness" in R UE. Pt reports decrease with rest.  Pt reports, "I worked really hard yesterday." OT session with focus on ADL retraining, functional transfers, functional mobility, and use of R UE during ADL tasks. Pt performing squat pivot onto tub bench from wheelchair with Min A for safety. Pt utilized a washing mitt on R hand in order to have L UE assist R to wash body. Pt required Min A for bathing. Toilet transfer with Min A with use of wheelchair and grab bar. Dressing performed seated EOB with use of hemi walker to stand. Pt utilizing hemi dressing technique and crossover technique in order to increase I with ADLs. Grooming performed at sink side.   Session 2 [phone]([id number])- Upon entering the room, pt seated in wheelchair with sister present in the room. Pt with no c/o pain this session.  OT session with focus on functional transfers, functional mobility, dynamic standing balance, and wt. Bearing through R UE. Pt  performed sit to stand x 4 reps from wheelchair height with use of hemi walker and set up with verbal cues from therapist. Pt ambulated 10' with hemi walker into bathroom for min A toilet transfer. Pt required facilitation of weight shifting of feet in order to pick R foot up versus drag. Pt propelled self in wheelchair to day room area. Pt standing for 15 and then 8 minutes , respectively, with R hand into weight bearing position on table top while engaging in familiar table top leisure activity. Pt demonstrated the ability to correct posture and position with verbal cues. Pt performed self ROM 3 sets of 10 R shoulder shrugs. Pt performed table slides of shoulder flexion of 3 sets of 10. Pt propelled self back to room at end of session. Pt making progress towards goals.   Therapy Documentation Precautions:  Precautions Precautions: Fall Precaution Comments: Dense R hemi Restrictions Weight Bearing Restrictions: No Pain: Pain Assessment Pain Assessment: No/denies pain Pain Score: 2  Pain Type: Acute pain Pain Location: Shoulder Pain Orientation: Right Pain Descriptors / Indicators: Aching Pain Onset: With Activity Patients Stated Pain Goal: 2 Pain Intervention(s): Repositioned   See FIM for current functional status  Therapy/Group: Individual Therapy  Lowella Gripittman, Ardenia Stiner L 01/02/2014, 11:53 AM

## 2014-01-02 NOTE — Progress Notes (Signed)
Physical Therapy Session Note  Patient Details  Name: [name] MRN: [id number] Date of Birth: 11/17/1955  Today's Date: 01/02/2014 Time: 0830-0930 Time Calculation (min): 60 min  Short Term Goals: Week 1:  PT Short Term Goal 1 (Week 1): Pt will perform bed mobility with mod I via log roll technique. PT Short Term Goal 2 (Week 1): Pt will transfer bed <> w/c with supervision.  PT Short Term Goal 3 (Week 1): Pt will propel w/c using L hemi technique with mod I.  PT Short Term Goal 4 (Week 1): Pt will ambulate with max A x 1.  PT Short Term Goal 5 (Week 1): Pt will negotiate 3 stairs using 1 rail and max A x 1.  Skilled Therapeutic Interventions/Progress Updates:   Pt received seated in w/c performing RUE exercises, agreeable to therapy. W/c mobility using L hemi technique x 150 ft, mod I. In home environment, pt ambulated 10 ft x 2 with hemi walker and R DF wrap assist, min A. Pt performed bed mobility sit <>supine x2 on regular bed to R side with min A and vc's for sequencing and technique. Pt performed furniture transfers to low compliant surface, multiple trials with hemi walker and vc's for anterior weightshift, mod A. See details below for supine RLE NMR performed. On mat pt performed sit <> supine to L side x 2 with supervision, vc's for technique. Gait training x 50 ft in controlled environment using hemiwalker and R Toe Off AFO, mod A with 2 standing rest breaks. Pt requires vc's for pacing, upright head, and R knee ext stability in stance. Pt negotiated up/down 5 stairs x 2 with L rail and min-mod A and one cue for sequencing. Pt performed squat pivot transfer mat <> w/c to L with min guard, min cues for technique. Pt left sitting in w/c to return to room. Pt may benefit from R heel wedge to decrease knee hyperextension moment in stance phase in addition to R AFO.    Therapy Documentation Precautions:  Precautions Precautions: Fall Precaution Comments: Dense R  hemi Restrictions Weight Bearing Restrictions: No Pain: Pain Assessment Pain Assessment: No/denies pain Other Treatments: Treatments Neuromuscular Facilitation: Right;Lower Extremity;Forced use;Activity to increase coordination;Activity to increase motor control;Activity to increase timing and sequencing;Activity to increase grading;Activity to increase sustained activation with maxislide and therapist providing tactile cues, pt performed active assisted supine hip and knee flex/ext, supine and hooklying hip abd/add, and bridging with therapist stablizing R foot on mat table.   See FIM for current functional status  Therapy/Group: Individual Therapy  [name], [name] 01/02/2014, 9:42 AM

## 2014-01-02 NOTE — Progress Notes (Signed)
58 year old RH-male with history of HTN (no meds > 1 year) who was admitted to [facility] on 08/03/015 with two day history of fluctuating RUE weakness progressive to difficulty speaking and some swallowing issues. He reported being under a lot of stress, working 16 days straight and family forced him to seek medical assistance. UDS negative. TSH- 2.54. Hgb A1c- 601. CT head with recent left lenticulostriate infarct  Subjective/Complaints: Working on bathing and dressing with therapy Review of Systems - Negative except R side weak Objective: Vital Signs: Blood pressure 145/80, pulse 69, temperature 97.9 F (36.6 C), temperature source Oral, resp. rate 18, height 5' 11" (1.803 m), weight 98.6 kg (217 lb 6 oz), SpO2 96.00%. No results found. No results found for this or any previous visit (from the past 72 hour(s)).   HEENT: normal Cardio: RRR Resp: CTA B/L and unlabored GI: BS positive and NT,ND Extremity:  Pulses positive and No Edema Skin:   Intact Neuro: Alert/Oriented, Normal Sensory and Abnormal Motor trace biceps otherwise 0/5 in RUE,2- hip/knee ext synergy,2- Hip Add and abd,  0/5 in R foot/ankle Musc/Skel:  Normal Gen NAD   Assessment/Plan: 1. 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 Physiatrist is providing close team supervision and 24 hour management of active medical problems listed below. Physiatrist and rehab team continue to assess barriers to discharge/monitor patient progress toward functional and medical goals. Team conference today please see physician documentation under team conference tab, met with team face-to-face to discuss problems,progress, and goals. Formulized individual treatment plan based on medical history, underlying problem and comorbidities. FIM: FIM - Bathing Bathing Steps Patient Completed: Chest;Right Arm;Abdomen;Front  perineal area;Buttocks;Right upper leg;Left upper leg;Right lower leg (including foot);Left lower leg (including foot) Bathing: 4: Min-Patient completes 8-9 54f10 parts or 75+ percent  FIM - Upper Body Dressing/Undressing Upper body dressing/undressing steps patient completed: Thread/unthread right sleeve of pullover shirt/dresss;Thread/unthread left sleeve of pullover shirt/dress;Pull shirt over trunk;Put head through opening of pull over shirt/dress Upper body dressing/undressing: 5: Set-up assist to: Obtain clothing/put away FIM - Lower Body Dressing/Undressing Lower body dressing/undressing steps patient completed: Thread/unthread right underwear leg;Thread/unthread left underwear leg;Thread/unthread right pants leg;Thread/unthread left pants leg;Don/Doff right sock;Don/Doff left sock;Don/Doff left shoe Lower body dressing/undressing: 3: Mod-Patient completed 50-74% of tasks  FIM - Toileting Toileting steps completed by patient: Performs perineal hygiene Toileting Assistive Devices: Grab bar or rail for support Toileting: 0: Activity did not occur  FIM - TRadio producerDevices: Elevated toilet seat;Grab bars Toilet Transfers: 0-Activity did not occur  FIM - BControl and instrumentation engineerDevices: Arm rests Bed/Chair Transfer: 4: Chair or W/C > Bed: Min A (steadying Pt. > 75%);4: Bed > Chair or W/C: Min A (steadying Pt. > 75%)  FIM - Locomotion: Wheelchair Distance: 150 Locomotion: Wheelchair: 6: Travels 150 ft or more, turns around, maneuvers to table, bed or toilet, negotiates 3% grade: maneuvers on rugs and over door sills independently FIM - Locomotion: Ambulation Locomotion: Ambulation Assistive Devices: Walker - Hemi;Orthosis (R ankle DF wrap assist) Ambulation/Gait Assistance: 4: Min assist Locomotion: Ambulation: 1: Travels less than 50 ft with minimal assistance (Pt.>75%)  Comprehension Comprehension Mode:  Auditory Comprehension: 6-Follows complex conversation/direction: With extra time/assistive device  Expression Expression Mode: Verbal Expression: 6-Expresses complex ideas: With extra time/assistive device  Social Interaction Social Interaction: 7-Interacts appropriately with others - No medications needed.  Problem Solving Problem Solving: 6-Solves  complex problems: With extra time  Memory Memory: 6-Assistive device: No helper  Medical Problem List and Plan:  1. 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  2. DVT Prophylaxis/Anticoagulation: Pharmaceutical: Lovenox. Will check dopplers to rule out DVT.  3. Pain Management: N/A  4. Mood: Motivated to get better. Will have LCSW follow for evaluation and support.  5. Neuropsych: This patient is capable of making decisions on his own behalf.  6. Skin/Wound Care: Monitor skin with routine checks. Maintain adequate hydration and nutritional status.  7. HTN: Will monitor BP every 8 hours. Avoid hypotension to allow adequate perfusion.  8. Dyslipidemia: Continue zocor.    LOS (Days) 6 A FACE TO FACE EVALUATION WAS PERFORMED  KIRSTEINS,ANDREW E 01/02/2014, 7:24 AM

## 2014-01-02 NOTE — Progress Notes (Signed)
58 year old RH-male with history of HTN (no meds > 1 year) who was admitted to [facility] on 08/03/015 with two day history of fluctuating RUE weakness progressive to difficulty speaking and some swallowing issues. He reported being under a lot of stress, working 16 days straight and family forced him to seek medical assistance. UDS negative. TSH- 2.54. Hgb A1c- 601. CT head with recent left lenticulostriate infarct  Subjective/Complaints: Sleeping well, no bowel or bladder issues, good appetite Review of Systems - Negative except R side weak Objective: Vital Signs: Blood pressure 145/80, pulse 69, temperature 97.9 F (36.6 C), temperature source Oral, resp. rate 18, height $RemoveBe'5\' 11"'[id number]$  (1.803 m), weight 98.6 kg (217 lb 6 oz), SpO2 96.00%. No results found. No results found for this or any previous visit (from the past 72 hour(s)).   HEENT: normal Cardio: RRR Resp: CTA B/L and unlabored GI: BS positive and NT,ND Extremity:  Pulses positive and No Edema Skin:   Intact Neuro: Alert/Oriented, Normal Sensory and Abnormal Motor trace biceps otherwise 0/5 in RUE,2- hip/knee ext synergy, 0/5 in R foot/ankle Musc/Skel:  Normal Gen NAD   Assessment/Plan: 1. 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 Physiatrist is providing close team supervision and 24 hour management of active medical problems listed below. Physiatrist and rehab team continue to assess barriers to discharge/monitor patient progress toward functional and medical goals. Team conference today please see physician documentation under team conference tab, met with team face-to-face to discuss problems,progress, and goals. Formulized individual treatment plan based on medical history, underlying problem and comorbidities. FIM: FIM - Bathing Bathing Steps Patient Completed: Chest;Right Arm;Abdomen;Front perineal  area;Buttocks;Right upper leg;Left upper leg;Right lower leg (including foot);Left lower leg (including foot) Bathing: 4: Min-Patient completes 8-9 23f 10 parts or 75+ percent  FIM - Upper Body Dressing/Undressing Upper body dressing/undressing steps patient completed: Thread/unthread right sleeve of pullover shirt/dresss;Thread/unthread left sleeve of pullover shirt/dress;Pull shirt over trunk;Put head through opening of pull over shirt/dress Upper body dressing/undressing: 5: Set-up assist to: Obtain clothing/put away FIM - Lower Body Dressing/Undressing Lower body dressing/undressing steps patient completed: Thread/unthread right underwear leg;Thread/unthread left underwear leg;Thread/unthread right pants leg;Thread/unthread left pants leg;Don/Doff right sock;Don/Doff left sock;Don/Doff left shoe Lower body dressing/undressing: 3: Mod-Patient completed 50-74% of tasks  FIM - Toileting Toileting steps completed by patient: Performs perineal hygiene Toileting Assistive Devices: Grab bar or rail for support Toileting: 0: Activity did not occur  FIM - Radio producer Devices: Elevated toilet seat;Grab bars Toilet Transfers: 0-Activity did not occur  FIM - Control and instrumentation engineer Devices: Arm rests Bed/Chair Transfer: 4: Chair or W/C > Bed: Min A (steadying Pt. > 75%);4: Bed > Chair or W/C: Min A (steadying Pt. > 75%)  FIM - Locomotion: Wheelchair Distance: 150 Locomotion: Wheelchair: 6: Travels 150 ft or more, turns around, maneuvers to table, bed or toilet, negotiates 3% grade: maneuvers on rugs and over door sills independently FIM - Locomotion: Ambulation Locomotion: Ambulation Assistive Devices: Walker - Hemi;Orthosis (R ankle DF wrap assist) Ambulation/Gait Assistance: 4: Min assist Locomotion: Ambulation: 1: Travels less than 50 ft with minimal assistance (Pt.>75%)  Comprehension Comprehension Mode: Auditory Comprehension:  6-Follows complex conversation/direction: With extra time/assistive device  Expression Expression Mode: Verbal Expression: 6-Expresses complex ideas: With extra time/assistive device  Social Interaction Social Interaction: 7-Interacts appropriately with others - No medications needed.  Problem Solving Problem Solving: 6-Solves complex problems: With  extra time  Memory Memory: 6-Assistive device: No helper  Medical Problem List and Plan:  1. 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  2. DVT Prophylaxis/Anticoagulation: Pharmaceutical: Lovenox. Will check dopplers to rule out DVT.  3. Pain Management: N/A  4. Mood: Motivated to get better. Will have LCSW follow for evaluation and support.  5. Neuropsych: This patient is capable of making decisions on his own behalf.  6. Skin/Wound Care: Monitor skin with routine checks. Maintain adequate hydration and nutritional status.  7. HTN: Will monitor BP every 8 hours. Avoid hypotension to allow adequate perfusion.  8. Dyslipidemia: Continue zocor.    LOS (Days) 6 A FACE TO FACE EVALUATION WAS PERFORMED  KIRSTEINS,ANDREW E 01/02/2014, 7:11 AM

## 2014-01-02 NOTE — Progress Notes (Signed)
Social Work Patient ID: [name], male   DOB: 06/26/1955, 58 y.o.   MRN: [id number]   Reviewed team conference this afternoon with pt (and left VM for wife).  Pt aware and agreeable with targeted d/c date of 8/26 with mod i / min assist overall goals.  Very pleased with progress so far and very motivated for continued tx.  No concerns at this point.  Aware that I will continue to cover this week while Becky Dupree on vacation.  Joee Iovine, LCSW

## 2014-01-02 NOTE — Patient Care Conference (Signed)
Inpatient RehabilitationTeam Conference and Plan of Care Update Date: 01/01/2014   Time: 11:05 AM    Patient Name: [name]      Medical Record Number: [id number]  Date of Birth: 04/09/1956 Sex: Male         Room/Bed: 4M02C/4M02C-01 Payor Info: Payor: BLUE Merchant navy officerCROSS BLUE SHIELD / Plan: BCBS PPO OUT OF STATE / Product Type: *No Product type* /    Admitting Diagnosis: CVA  Admit Date/Time:  12/27/2013  1:28 PM Admission Comments: No comment available   Primary Diagnosis:  <principal problem not specified> Principal Problem: <principal problem not specified>  Patient Active Problem List   Diagnosis Date Noted  . CVA (cerebral infarction) 12/27/2013    Expected Discharge Date: Expected Discharge Date: 01/16/14  Team Members Present: Physician leading conference: Dr. Claudette LawsAndrew Kirsteins Social Worker Present: Amada JupiterLucy Yanique Mulvihill, LCSW Nurse Present: Gregor HamsMichele VonCannon, RN PT Present: Bayard Huggerebecca Varner, PT OT Present: Callie FieldingKatie Pittman, OT;Jennifer Katrinka BlazingSmith, OT SLP Present: Jackalyn LombardNicole Page, SLP PPS Coordinator present : Tora DuckMarie Noel, RN, CRRN     Current Status/Progress Goal Weekly Team Focus  Medical   Severe hemiparesis, cognition is good  maintain med stability  improve activity tolerance   Bowel/Bladder   continent of bowel and bladder Last Bm 12/31/13   regular stool Q1-2 days, regular voids Qs  assess output and last stool daily,Q shift   Swallow/Nutrition/ Hydration             ADL's   Min A for toilet transfers, Min A for bathing, Min A for tub transfers, Mod A for LB dressing, Set up A for UB dressing  Min A  functional mobility/transfers, self care, self ROM, pt. education, standing balance   Mobility   min-mod A gait up to 40 ft using hemiwalker and stairs using L rail, S-min A bed mobility and transfers  mod I to min A  gait, functional transfers, stairs, R NMR, dynamic balance   Communication   Mod I   Mod I   patient discharged after education was completed   Safety/Cognition/ Behavioral  Observations  follows safety plan w/ reminders, bed alrm in use, uses call bell  no injury,safe mobility,d/c to safe environment      Pain   c/o r shoulder pain,takes tylenol,then robaxin if no relief from tylenol  pain relief,less than 3  assess for pain daily,medicate prn and assess for response   Skin   intact   skin remains intact  assess skin,educate pt on importance to trun in bed    Rehab Goals Patient on target to meet rehab goals: Yes *See Care Plan and progress notes for long and short-term goals.  Barriers to Discharge: still needs physical assist    Possible Resolutions to Barriers:  ID caregiver post D/C and train    Discharge Planning/Teaching Needs:  home with wife and sister available to provide 24/7 assist      Team Discussion:  Making excellent gains and very motivated.  Following all instruction very well.  Goals of mod i  - min assist overall.  No concerns at this point.  Revisions to Treatment Plan:  None   Continued Need for Acute Rehabilitation Level of Care: The patient requires daily medical management by a physician with specialized training in physical medicine and rehabilitation for the following conditions: Daily direction of a multidisciplinary physical rehabilitation program to ensure safe treatment while eliciting the highest outcome that is of practical value to the patient.: Yes Daily medical management of patient stability for  increased activity during participation in an intensive rehabilitation regime.: Yes Daily analysis of laboratory values and/or radiology reports with any subsequent need for medication adjustment of medical intervention for : Neurological problems;Other  Deverick Pruss 01/02/2014, 4:48 PM

## 2014-01-03 ENCOUNTER — Inpatient Hospital Stay (HOSPITAL_COMMUNITY): Payer: BC Managed Care – PPO | Admitting: Occupational Therapy

## 2014-01-03 ENCOUNTER — Inpatient Hospital Stay (HOSPITAL_COMMUNITY): Payer: BC Managed Care – PPO | Admitting: Physical Therapy

## 2014-01-03 DIAGNOSIS — I6330 Cerebral infarction due to thrombosis of unspecified cerebral artery: Secondary | ICD-10-CM

## 2014-01-03 DIAGNOSIS — I10 Essential (primary) hypertension: Secondary | ICD-10-CM

## 2014-01-03 NOTE — Progress Notes (Signed)
Physical Therapy Weekly Progress Note  Patient Details  Name: [name] MRN: [id number] Date of Birth: 1955/06/11  Beginning of progress report period: December 28, 2013 End of progress report period: January 03, 2014  Today's Date: 01/03/2014 Time: 4098-1191 and 1105-1150 Time Calculation (min): 60 min and 45 min  Patient has met 4 of 5 short term goals. Patient currently requires supervision for squat pivot transfers and min A for stand pivot transfers with min cues for technique and supervision for bed mobility with min cues for sequencing. Pt requires min-mod A for gait using R AFO or DF wrap assist and hemi walker x 20-50 ft and for stair negotiation using L rail. Pt is mod I with w/c mobility using L hemi technique. Pt demonstrates mild impulsivity although more related to personality at baseline vs impairment post-CVA and requires cues for pacing to increase safety and control.   Patient continues to demonstrate the following deficits: Hemiplegia RUE > RLE, impaired balance, muscle weakness, decreased cardiorespiratory endurance and impaired timing and sequencing, abnormal tone, unbalanced muscle activation and therefore will continue to benefit from skilled PT intervention to enhance overall performance with activity tolerance, balance, postural control, ability to compensate for deficits and functional use of  right upper extremity and right lower extremity.  Patient progressing toward long term goals. Continue plan of care.  PT Short Term Goals Week 1:  PT Short Term Goal 1 (Week 1): Pt will perform bed mobility with mod I via log roll technique. PT Short Term Goal 1 - Progress (Week 1): Not met PT Short Term Goal 2 (Week 1): Pt will transfer bed <> w/c with supervision.  PT Short Term Goal 2 - Progress (Week 1): Met PT Short Term Goal 3 (Week 1): Pt will propel w/c using L hemi technique with mod I.  PT Short Term Goal 3 - Progress (Week 1): Met PT Short Term Goal 4 (Week 1): Pt  will ambulate with max A x 1.  PT Short Term Goal 4 - Progress (Week 1): Met PT Short Term Goal 5 (Week 1): Pt will negotiate 3 stairs using 1 rail and max A x 1. PT Short Term Goal 5 - Progress (Week 1): Met Week 2:  PT Short Term Goal 1 (Week 2): Pt will perform bed mobility with mod I via log roll technique. PT Short Term Goal 2 (Week 2): Pt will transfer bed <> w/c with mod I consistently.   PT Short Term Goal 3 (Week 2): Pt will ambulate 25 ft using LRAD and min A consistently.  PT Short Term Goal 4 (Week 2): Pt will negotiate 3 stairs using 1 rail and min A.  Skilled Therapeutic Interventions/Progress Updates:   Session 1: Pt received sitting in w/c, mod I for w/c mobility x 150 ft using L hemi technique. Pt performed RUE/LE NMR in tall kneeling, tall kneeling <> half kneeling, and quaduped positions on mat table. Pt performed squats in tall kneeling with focus on hip extension with BUEs support on bench, LUE reaching activities anterior and across midline in quadruped to facilitate RUE WB, transitioning from tall kneeling <> half kneeling, and in tall kneeling with BUEs supported on bench, advancing LLE fwd/back to facilitate RLE WB and stability. Pt performed mini pushups on raised mat and BOSU with dome side down, therapist assisting at [place], to facilitate equal WB and RUE muscle activation at tricep. Gait training using R DF wrap assist (appears to give patient more support than [employer]  AFO) and hemiwalker x 20 ft, min-mod A. Pt tolerated treatment well and returned to room in w/c.   Session 2: Pt received sitting in w/c agreeable to therapy. For RLE NMR for muscle activation and timing and sequencing, pt performed NuStep using LEs only level 3-5 x 5 min. Pt performed community and outdoor w/c mobility using L hemi technique with focus on negotiating inclines and declines. Pt required one cue to ascend ramp backwards for improved safety and efficient propulsion, overall mod I. Pt also  instructed in w/c parts management, requires initial min A for R leg rest progressed to supervision. Pt performed stand pivot transfers w/c <> park bench without AD with min A and verbal cues for technique. Pt returned to room and left sitting in w/c with all needs within reach.   Therapy Documentation Precautions:  Precautions Precautions: Fall Precaution Comments: Dense R hemi Restrictions Weight Bearing Restrictions: No Pain:  Denied pain  See FIM for current functional status  Therapy/Group: Individual Therapy  Laretta Alstrom 01/03/2014, 11:20 AM

## 2014-01-03 NOTE — Progress Notes (Signed)
58 year old RH-male with history of HTN (no meds > 1 year) who was admitted to [facility] on 08/03/015 with two day history of fluctuating RUE weakness progressive to difficulty speaking and some swallowing issues. He reported being under a lot of stress, working 16 days straight and family forced him to seek medical assistance. UDS negative. TSH- 2.54. Hgb A1c- 601. CT head with recent left lenticulostriate infarct  Subjective/Complaints: Working on bathing and dressing with therapy Review of Systems - Negative except R side weak Objective: Vital Signs: Blood pressure 160/83, pulse 67, temperature 98.3 F (36.8 C), temperature source Oral, resp. rate 18, height 5\' 11"  (1.803 m), weight 98.6 kg (217 lb 6 oz), SpO2 99.00%. No results found. No results found for this or any previous visit (from the past 72 hour(s)).   HEENT: normal Cardio: RRR Resp: CTA B/L and unlabored GI: BS positive and NT,ND Extremity:  Pulses positive and No Edema Skin:   Intact Neuro: Alert/Oriented, Normal Sensory and Abnormal Motor trace biceps otherwise 0/5 in RUE,2- hip/knee ext synergy,2- Hip Add and abd,  0/5 in R foot/ankle Musc/Skel:  Normal Gen NAD   Assessment/Plan: 1. 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 Physiatrist is providing close team supervision and 24 hour management of active medical problems listed below. Physiatrist and rehab team continue to assess barriers to discharge/monitor patient progress toward functional and medical goals.  FIM: FIM - Bathing Bathing Steps Patient Completed: Chest;Right Arm;Abdomen;Front perineal area;Buttocks;Right upper leg;Left upper leg;Right lower leg (including foot);Left lower leg (including foot) Bathing: 4: Min-Patient completes 8-9 4930f 10 parts or 75+ percent  FIM - Upper Body Dressing/Undressing Upper body  dressing/undressing steps patient completed: Thread/unthread right sleeve of pullover shirt/dresss;Thread/unthread left sleeve of pullover shirt/dress;Put head through opening of pull over shirt/dress;Pull shirt over trunk Upper body dressing/undressing: 5: Set-up assist to: Obtain clothing/put away FIM - Lower Body Dressing/Undressing Lower body dressing/undressing steps patient completed: Thread/unthread right underwear leg;Thread/unthread left underwear leg;Thread/unthread right pants leg;Thread/unthread left pants leg;Don/Doff right sock;Don/Doff left sock;Don/Doff right shoe;Don/Doff left shoe Lower body dressing/undressing: 3: Mod-Patient completed 50-74% of tasks  FIM - Toileting Toileting steps completed by patient: Performs perineal hygiene Toileting Assistive Devices: Grab bar or rail for support Toileting: 0: Activity did not occur  FIM - Diplomatic Services operational officerToilet Transfers Toilet Transfers Assistive Devices: Elevated toilet seat;Grab bars Toilet Transfers: 4-To toilet/BSC: Min A (steadying Pt. > 75%)  FIM - Bed/Chair Transfer Bed/Chair Transfer Assistive Devices: Arm rests Bed/Chair Transfer: 4: Chair or W/C > Bed: Min A (steadying Pt. > 75%);4: Bed > Chair or W/C: Min A (steadying Pt. > 75%);5: Supine > Sit: Supervision (verbal cues/safety issues);4: Sit > Supine: Min A (steadying pt. > 75%/lift 1 leg)  FIM - Locomotion: Wheelchair Distance: 150 Locomotion: Wheelchair: 6: Travels 150 ft or more, turns around, maneuvers to table, bed or toilet, negotiates 3% grade: maneuvers on rugs and over door sills independently FIM - Locomotion: Ambulation Locomotion: Ambulation Assistive Devices: Walker - Hemi;Orthosis (R AFO) Ambulation/Gait Assistance: 4: Min assist;3: Mod assist Locomotion: Ambulation: 2: Travels 50 - 149 ft with moderate assistance (Pt: 50 - 74%)  Comprehension Comprehension Mode: Auditory Comprehension: 6-Follows complex conversation/direction: With extra time/assistive  device  Expression Expression Mode: Verbal Expression: 6-Expresses complex ideas: With extra time/assistive device  Social Interaction Social Interaction: 7-Interacts appropriately with others - No medications needed.  Problem Solving Problem Solving: 6-Solves complex problems: With extra time  Memory Memory: 6-Assistive device: No helper  Medical Problem List and Plan:  1. 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  2. DVT Prophylaxis/Anticoagulation: Pharmaceutical: Lovenox. Will check dopplers to rule out DVT.  3. Pain Management: N/A , R shoulder pain improved with ROM exercises 4. Mood: Motivated to get better. Will have LCSW follow for evaluation and support.  5. Neuropsych: This patient is capable of making decisions on his own behalf.  6. Skin/Wound Care: Monitor skin with routine checks. Maintain adequate hydration and nutritional status.  7. HTN: Will monitor BP every 8 hours. Avoid hypotension to allow adequate perfusion.  8. Dyslipidemia: Continue zocor.    LOS (Days) 7 A FACE TO FACE EVALUATION WAS PERFORMED  KIRSTEINS,ANDREW E 01/03/2014, 7:06 AM

## 2014-01-03 NOTE — Progress Notes (Signed)
Occupational Therapy Session Note  Patient Details  Name: [name] MRN: [id number] Date of Birth: 01/25/1956  Today's Date: 01/03/2014 Time: [id number] and [id number] Time Calculation (min): 45 min and 30 min  Short Term Goals: Week 1:  OT Short Term Goal 1 (Week 1): Pt will perform grooming with set up A in order to increase I in self care OT Short Term Goal 2 (Week 1): Pt. will perform bathing with Min A at sinkside in order to increase I in self care. OT Short Term Goal 3 (Week 1): Pt will perform toilet transfer with Mod A in order to increase I in functional transfers. OT Short Term Goal 4 (Week 1): Pt will perform UB dressing with Min A utilizing hemiplegic technique in order to increase I with dressing.  OT Short Term Goal 5 (Week 1): Pt will perform LB dressing with Max A in order to increase I with self care.  Skilled Therapeutic Interventions/Progress Updates:  Session 1 [phone]): Pt with no c/o pain this session. Upon entering the room, pt seated on edge of bed awaiting therapist arrival. OT session with focus on ADL retraining, functional transfers, dynamic standing balance, and assist of R UE for functional activities as well as weight bearing. Pt performed squat pivot from bed >wheelchair with steady assist. Min A for squat pivot transfer wheelchair <> tub transfer bench for bathing. Pt required assistance to donn bath mitt to attach to R hand. Verbal cues utilized in order for pt to utilize L hand to assist R UE in wash chest,face, abdomen, R upper leg, and L upper leg. Pt performed dressing seated on edge of bed. Pt utilizing hemiplegic technique for dressing and cross over technique with verbal cues. Pt ambulated with hemi walker and R AFO brace applied with min A and verbal cues for weight shifting. Pt stood at sink for 5 minutes with steady assist in order to perform grooming tasks. Pt making progress towards goals.   Session 2 [phone]): Pt with no c/o pain during  session and seated in wheelchair upon therapist arrival. Pt propelled wheelchair  ~ 350 feet to treatment area and back with use of L UE  And L LE. Sit to stand from wheelchair with Min A. Pt requiring assistance to place R UE into weight bearing position on table top during activity. Pt stood for 16 minutes, weight bearing, while engaging in leisure table top activity. Pt requiring verbal cues to shift weight, change foot placement, and to keep from leaning onto table top. Pt then took 6 steps to the L and then to the R with Min A for steady assistance and to facilitate weight shifting secondary to pt on carpeted flooring.   Therapy Documentation Precautions:  Precautions Precautions: Fall Precaution Comments: Dense R hemi Restrictions Weight Bearing Restrictions: No See FIM for current functional status  Therapy/Group: Individual Therapy  Lowella Gripittman, Rickeya Manus L 01/03/2014, 8:57 AM

## 2014-01-03 NOTE — Plan of Care (Signed)
Problem: RH BOWEL ELIMINATION Goal: RH STG MANAGE BOWEL W/MEDICATION W/ASSISTANCE STG Manage Bowel with Medication with Mod I Assistance.  Outcome: Progressing Not required miralax for several days

## 2014-01-04 ENCOUNTER — Inpatient Hospital Stay (HOSPITAL_COMMUNITY): Payer: BC Managed Care – PPO | Admitting: Physical Therapy

## 2014-01-04 ENCOUNTER — Encounter (HOSPITAL_COMMUNITY): Payer: BC Managed Care – PPO | Admitting: Occupational Therapy

## 2014-01-04 ENCOUNTER — Inpatient Hospital Stay (HOSPITAL_COMMUNITY): Payer: BC Managed Care – PPO | Admitting: Occupational Therapy

## 2014-01-04 NOTE — Progress Notes (Addendum)
Physical Therapy Progress Note  Patient Details  Name: [name] MRN: [id number] Date of Birth: 11/23/1955  Today's Date: 01/04/2014 Time: [id number] Time Calculation (min): 62 min  PT Short Term Goals Week 2:  PT Short Term Goal 1 (Week 2): Pt will perform bed mobility with mod I via log roll technique. PT Short Term Goal 2 (Week 2): Pt will transfer bed <> w/c with mod I consistently.   PT Short Term Goal 3 (Week 2): Pt will ambulate 25 ft using LRAD and min A consistently.  PT Short Term Goal 4 (Week 2): Pt will negotiate 3 stairs using 1 rail and min A.  Skilled Therapeutic Interventions/Progress Updates:    Pt received seated in w/c wearing R AFO ([employer]); agreeable to therapy. Session focused on gait training using LiteGait. Pt performed w/c mobility x180' total in controlled environment with L hemi technique and mod I. Performed gait x60' in controlled environment with hemi walker and min A. Remainder of session focused on gait training using LiteGait with multimodal cueing to address the following gait deviations: R hip retraction and limited R knee extension during R stance; limited R knee flexion during RLE advancement; and decreased lateral weight shift to L. AFO worn during LiteGait. Gait x40' with hemi wallker and min A during which pt did demonstrate improved lateral weight shift to L side and increased R knee flexion during RLE advancement. Pt left seated in w/c with all needs within reach.  Therapy Documentation Precautions:  Precautions Precautions: Fall Precaution Comments: Dense R hemi Restrictions Weight Bearing Restrictions: No Pain: Pain Assessment Pain Assessment: No/denies pain Locomotion : Ambulation Ambulation/Gait Assistance: 4: Min assist Wheelchair Mobility Distance: 180   See FIM for current functional status  Therapy/Group: Individual Therapy  Renn Stille, Lorenda IshiharaBlair A 01/04/2014, 10:46 AM

## 2014-01-04 NOTE — Progress Notes (Signed)
58 year old RH-male with history of HTN (no meds > 1 year) who was admitted to [facility] on 08/03/015 with two day history of fluctuating RUE weakness progressive to difficulty speaking and some swallowing issues. He reported being under a lot of stress, working 16 days straight and family forced him to seek medical assistance. UDS negative. TSH- 2.54. Hgb A1c- 601. CT head with recent left lenticulostriate infarct  Subjective/Complaints: Sleeping well Review of Systems - Negative except R side weak Objective: Vital Signs: Blood pressure 145/78, pulse 74, temperature 98.4 F (36.9 C), temperature source Oral, resp. rate 18, height 5\' 11"  (1.803 m), weight 98.6 kg (217 lb 6 oz), SpO2 99.00%. No results found. No results found for this or any previous visit (from the past 72 hour(s)).   HEENT: normal Cardio: RRR Resp: CTA B/L and unlabored GI: BS positive and NT,ND Extremity:  Pulses positive and No Edema Skin:   Intact Neuro: Alert/Oriented, Normal Sensory and Abnormal Motor trace biceps otherwise 0/5 in RUE,2- hip/knee ext synergy,2- Hip Add and abd,  0/5 in R foot/ankle Musc/Skel:  Normal Gen NAD   Assessment/Plan: 1. 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 Physiatrist is providing close team supervision and 24 hour management of active medical problems listed below. Physiatrist and rehab team continue to assess barriers to discharge/monitor patient progress toward functional and medical goals.  FIM: FIM - Bathing Bathing Steps Patient Completed: Chest;Right Arm;Abdomen;Front perineal area;Buttocks;Right upper leg;Left upper leg Bathing: 4: Min-Patient completes 8-9 32f 10 parts or 75+ percent  FIM - Upper Body Dressing/Undressing Upper body dressing/undressing steps patient completed: Thread/unthread right sleeve of pullover shirt/dresss;Thread/unthread  left sleeve of pullover shirt/dress;Put head through opening of pull over shirt/dress;Pull shirt over trunk Upper body dressing/undressing: 5: Supervision: Safety issues/verbal cues FIM - Lower Body Dressing/Undressing Lower body dressing/undressing steps patient completed: Don/Doff right sock;Don/Doff left sock;Don/Doff left shoe;Thread/unthread left underwear leg;Thread/unthread left pants leg Lower body dressing/undressing: 3: Mod-Patient completed 50-74% of tasks  FIM - Toileting Toileting steps completed by patient: Performs perineal hygiene Toileting Assistive Devices: Grab bar or rail for support Toileting: 0: Activity did not occur  FIM - Diplomatic Services operational officer Devices: Elevated toilet seat;Grab bars Toilet Transfers: 4-To toilet/BSC: Min A (steadying Pt. > 75%)  FIM - Bed/Chair Transfer Bed/Chair Transfer Assistive Devices: Arm rests Bed/Chair Transfer: 4: Bed > Chair or W/C: Min A (steadying Pt. > 75%);5: Chair or W/C > Bed: Supervision (verbal cues/safety issues)  FIM - Locomotion: Wheelchair Distance: 150 Locomotion: Wheelchair: 6: Travels 150 ft or more, turns around, maneuvers to table, bed or toilet, negotiates 3% grade: maneuvers on rugs and over door sills independently FIM - Locomotion: Ambulation Locomotion: Ambulation Assistive Devices: Environmental consultant - Hemi;Orthosis Ambulation/Gait Assistance: 4: Min assist Locomotion: Ambulation: 1: Travels less than 50 ft with minimal assistance (Pt.>75%)  Comprehension Comprehension Mode: Auditory Comprehension: 6-Follows complex conversation/direction: With extra time/assistive device  Expression Expression Mode: Verbal Expression: 6-Expresses complex ideas: With extra time/assistive device  Social Interaction Social Interaction: 7-Interacts appropriately with others - No medications needed.  Problem Solving Problem Solving: 6-Solves complex problems: With extra time  Memory Memory: 6-More than  reasonable amt of time  Medical Problem List and Plan:  1. 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  2. DVT Prophylaxis/Anticoagulation: Pharmaceutical: Lovenox. Will check dopplers to rule out DVT.  3. Pain Management: N/A , R shoulder pain improved  with ROM exercises 4. Mood: Motivated to get better. Will have LCSW follow for evaluation and support.  5. Neuropsych: This patient is capable of making decisions on his own behalf.  6. Skin/Wound Care: Monitor skin with routine checks. Maintain adequate hydration and nutritional status.  7. HTN: Will monitor BP every 8 hours. Avoid hypotension to allow adequate perfusion.  8. Dyslipidemia: Continue zocor.    LOS (Days) 8 A FACE TO FACE EVALUATION WAS PERFORMED  [name] 01/04/2014, 7:01 AM

## 2014-01-04 NOTE — Progress Notes (Signed)
58 year old RH-male with history of HTN (no meds > 1 year) who was admitted to [facility] on 08/03/015 with two day history of fluctuating RUE weakness progressive to difficulty speaking and some swallowing issues. He reported being under a lot of stress, working 16 days straight and family forced him to seek medical assistance. UDS negative. TSH- 2.54. Hgb A1c- 601. CT head with recent left lenticulostriate infarct  Subjective/Complaints: Working on bathing and dressing with therapy R shoulder twitches at times moves when he yawns Review of Systems - Negative except R side weak Objective: Vital Signs: Blood pressure 145/78, pulse 74, temperature 98.4 F (36.9 C), temperature source Oral, resp. rate 18, height 5\' 11"  (1.803 m), weight 98.6 kg (217 lb 6 oz), SpO2 99.00%. No results found. No results found for this or any previous visit (from the past 72 hour(s)).   HEENT: normal Cardio: RRR Resp: CTA B/L and unlabored GI: BS positive and NT,ND Extremity:  Pulses positive and No Edema Skin:   Intact Neuro: Alert/Oriented, Normal Sensory and Abnormal Motor trace biceps otherwise 0/5 in RUE,2- hip/knee ext synergy,2- Hip Add and abd,  0/5 in R foot/ankle TOne is reduce RUE  Musc/Skel:  Normal, no pain with R shoulder ROM, neg impingement Gen NAD   Assessment/Plan: 1. 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 Physiatrist is providing close team supervision and 24 hour management of active medical problems listed below. Physiatrist and rehab team continue to assess barriers to discharge/monitor patient progress toward functional and medical goals.  FIM: FIM - Bathing Bathing Steps Patient Completed: Chest;Right Arm;Abdomen;Front perineal area;Buttocks;Right upper leg;Left upper leg Bathing: 4: Min-Patient completes 8-9 27f 10 parts or 75+ percent  FIM - Upper  Body Dressing/Undressing Upper body dressing/undressing steps patient completed: Thread/unthread right sleeve of pullover shirt/dresss;Thread/unthread left sleeve of pullover shirt/dress;Put head through opening of pull over shirt/dress;Pull shirt over trunk Upper body dressing/undressing: 5: Supervision: Safety issues/verbal cues FIM - Lower Body Dressing/Undressing Lower body dressing/undressing steps patient completed: Don/Doff right sock;Don/Doff left sock;Don/Doff left shoe;Thread/unthread left underwear leg;Thread/unthread left pants leg Lower body dressing/undressing: 3: Mod-Patient completed 50-74% of tasks  FIM - Toileting Toileting steps completed by patient: Performs perineal hygiene Toileting Assistive Devices: Grab bar or rail for support Toileting: 0: Activity did not occur  FIM - Diplomatic Services operational officer Devices: Elevated toilet seat;Grab bars Toilet Transfers: 4-To toilet/BSC: Min A (steadying Pt. > 75%)  FIM - Bed/Chair Transfer Bed/Chair Transfer Assistive Devices: Arm rests Bed/Chair Transfer: 4: Bed > Chair or W/C: Min A (steadying Pt. > 75%);5: Chair or W/C > Bed: Supervision (verbal cues/safety issues)  FIM - Locomotion: Wheelchair Distance: 150 Locomotion: Wheelchair: 6: Travels 150 ft or more, turns around, maneuvers to table, bed or toilet, negotiates 3% grade: maneuvers on rugs and over door sills independently FIM - Locomotion: Ambulation Locomotion: Ambulation Assistive Devices: Environmental consultant - Hemi;Orthosis Ambulation/Gait Assistance: 4: Min assist Locomotion: Ambulation: 1: Travels less than 50 ft with minimal assistance (Pt.>75%)  Comprehension Comprehension Mode: Auditory Comprehension: 6-Follows complex conversation/direction: With extra time/assistive device  Expression Expression Mode: Verbal Expression: 6-Expresses complex ideas: With extra time/assistive device  Social Interaction Social Interaction: 7-Interacts appropriately with  others - No medications needed.  Problem Solving Problem Solving: 6-Solves complex problems: With extra time  Memory Memory: 6-More than reasonable amt of time  Medical Problem List and Plan:  1. Functional deficits secondary to Left lenticular/left  corona radiata thrombotic infarct.  2. DVT Prophylaxis/Anticoagulation: Pharmaceutical: Lovenox. Will check dopplers to rule out DVT.  3. Pain Management: N/A , R shoulder pain improved with ROM exercises 4. Mood: Motivated to get better. Will have LCSW follow for evaluation and support.  5. Neuropsych: This patient is capable of making decisions on his own behalf.  6. Skin/Wound Care: Monitor skin with routine checks. Maintain adequate hydration and nutritional status.  7. HTN: Will monitor BP every 8 hours. Avoid hypotension to allow adequate perfusion.  8. Dyslipidemia: Continue zocor.    LOS (Days) 8 A FACE TO FACE EVALUATION WAS PERFORMED  KIRSTEINS,ANDREW E 01/04/2014, 7:38 AM

## 2014-01-04 NOTE — Progress Notes (Signed)
Occupational Therapy Session Note  Patient Details  Name: [name] MRN: [id number] Date of Birth: 04/03/1956  Today's Date: 01/04/2014 [id number] and [id number] Time Calculation (min): 60 min and 60 min  Short Term Goals: Week 1:  OT Short Term Goal 1 (Week 1): Pt will perform grooming with set up A in order to increase I in self care OT Short Term Goal 2 (Week 1): Pt. will perform bathing with Min A at sinkside in order to increase I in self care. OT Short Term Goal 3 (Week 1): Pt will perform toilet transfer with Mod A in order to increase I in functional transfers. OT Short Term Goal 4 (Week 1): Pt will perform UB dressing with Min A utilizing hemiplegic technique in order to increase I with dressing.  OT Short Term Goal 5 (Week 1): Pt will perform LB dressing with Max A in order to increase I with self care.   Skilled Therapeutic Interventions/Progress Updates:  Session 1 [phone]): Upon entering the room, pt supine in bed. Pt with no c/o pain this session. OT session with focus on ADL retraining, functional transfers, and dynamic standing balance. Pt performed tub bench transfer into shower from wheelchair with Min A for safety. Pt performed bathing with bath mitt on R UE . Verbal cues given for pt to utilize L hand to assist R in functional tasks of bathing. Pt required assistance to wash L UE and B feet. Pt performed dressing seated EOB with verbal cues for hemipegic technique for UB dressing as well as crossover technique assist. Pt continues to make progress towards goals as pt is very motivated.   Session 2 (1426-1526):Pt preformed toilet transfer with Min A by ambulating into bathroom with hemi walker and sitting onto elevated toilet seat. OT performed PROM of R UE in all planes x 10 reps each as pt reporting stiffness during session. Pt demonstrates 2-/5 strength for R scapula movements in all planes, elbow extension, and internal rotation. Pt performed 2 sets of 10 active  ROM exercises for these movements during session. Pt requiring increased rest breaks secondary to fatigue. Pt demonstrating table slides x 15 reps for active internal rotation. Pt very excited about movement.   Therapy Documentation Precautions:  Precautions Precautions: Fall Precaution Comments: Dense R hemi Restrictions Weight Bearing Restrictions: No  See FIM for current functional status  Therapy/Group: Individual Therapy  Lowella Gripittman, Seraiah Nowack L 01/04/2014, 4:51 PM

## 2014-01-05 ENCOUNTER — Inpatient Hospital Stay (HOSPITAL_COMMUNITY): Payer: BC Managed Care – PPO | Admitting: Occupational Therapy

## 2014-01-05 NOTE — Progress Notes (Signed)
Occupational Therapy Weekly Progress Note  Patient Details  Name: [name] MRN: [id number] Date of Birth: [birth year]  Beginning of progress report period: December 28, 2013 End of progress report period: January 05, 2014  Today's Date: 01/05/2014 Time: 1101-1201 Time Calculation (min): 60 min  Patient has met 5 of 5 short term goals.    Patient continues to demonstrate the following deficits: self care, functional transfers, functional mobility, dynamic standing balance, R UE AROM and strength and therefore will continue to benefit from skilled OT intervention to enhance overall performance with BADL.  Patient progressing toward long term goals..  Continue plan of care.  OT Short Term Goals Week 2:  OT Short Term Goal 1 (Week 2): Pt will perform LB dressing with Min A seated on edge of bed in order to increase I with self care. OT Short Term Goal 2 (Week 2): Pt will donn bath mitt onto R hand to assist with bathing independently in order to increase I in self care. OT Short Term Goal 3 (Week 2): Pt will donn R shoe with AFO with increased time in order to increased I with dressing task. OT Short Term Goal 4 (Week 2): Pt will stand at sink with supervision from grooming with R UE as stabilizer in order to perform grooming tasks and increase standing balance.   Skilled Therapeutic Interventions/Progress Updates:  Session 1: Pt with no c/o pain during session. Pt. Reports, " I have been doing tons of exercises in my room waiting for you. I am kind of tired now." OT session with focus on ADL retraining, dynamic standing balance, functional transfers, and pt education. Pt with 2 LOB when ambulating with Min- Mod A from bed to sink side with hemiwalker. Pt requiring Max A to regain balance. Pt required assistance for weight shifting and verbal cues for ambulation. Squat pivot transfer from wheelchair to tub transfer bench with Min A for safety. Pt required assistance to donn and doff bath mitt  for R hand for bathing. Verbal cues for L hand to assist R in bathing. Pt performed dressing tasks seated EOB. Pt had obtained clothing from drawer while seated in wheelchair prior to bathing. Pt had cut hair with electric razor prior to therapist arrival as well this session with therapist minimal assist to get places missed in back of head. Grooming performed at sink side with R hand utilized for weight bearing on sink with Min A for balance during grooming tasks. Pt making progress towards goals.   Therapy Documentation Precautions:  Precautions Precautions: Fall Precaution Comments: Dense R hemi Restrictions Weight Bearing Restrictions: No  Pain: Pain Assessment Pain Assessment: No/denies pain Pain Score: 0-No pain Faces Pain Scale: No hurt  See FIM for current functional status  Therapy/Group: Individual Therapy  [name] 01/05/2014, 12:35 PM

## 2014-01-05 NOTE — Progress Notes (Signed)
   Subjective/Complaints: No complaints- feels well Objective: Vital Signs: Blood pressure 158/92, pulse 70, temperature 98 F (36.7 C), temperature source Oral, resp. rate 16, height 5\' 11"  (1.803 m), weight 217 lb 6 oz (98.6 kg), SpO2 99.00%.  nad Chest- cta cv REG RATE abd- soft, NT, + Bs Ext- no edema  Assessment/Plan: 1. 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  2. DVT Prophylaxis/Anticoagulation: Pharmaceutical: Lovenox. Will check dopplers to rule out DVT.  3. Pain Management: denies pain 4. Mood: Motivated to get better. Will have LCSW follow for evaluation and support.  5. Neuropsych: This patient is capable of making decisions on his own behalf.  6. Skin/Wound Care: Monitor skin with routine checks. Maintain adequate hydration and nutritional status.  7. HTN: will monitor for now BP Readings from Last 3 Encounters:  01/05/14 158/92   8. Dyslipidemia: Continue zocor.    LOS (Days) 9 A FACE TO FACE EVALUATION WAS PERFORMED  SWORDS,BRUCE HENRY 01/05/2014, 8:43 AM

## 2014-01-06 ENCOUNTER — Inpatient Hospital Stay (HOSPITAL_COMMUNITY): Payer: BC Managed Care – PPO

## 2014-01-06 NOTE — Progress Notes (Signed)
Physical Therapy Session Note  Patient Details  Name: [name] MRN: [id number] Date of Birth: 11/21/1955  Today's Date: 01/06/2014 Time: 1003-1103 Time Calculation (min): 60 min  Short Term Goals: Week 2:  PT Short Term Goal 1 (Week 2): Pt will perform bed mobility with mod I via log roll technique. PT Short Term Goal 2 (Week 2): Pt will transfer bed <> w/c with mod I consistently.   PT Short Term Goal 3 (Week 2): Pt will ambulate 25 ft using LRAD and min A consistently.  PT Short Term Goal 4 (Week 2): Pt will negotiate 3 stairs using 1 rail and min A.  Skilled Therapeutic Interventions/Progress Updates:    Session focused on functional ambulation, NMR for RUE/RLE, functional mobility. Pt propelled w/c w/ mod (I) 100', set up w/c and managed parts to prepare for t/f w/ mod (I). Squat pivot transfer w/c<>mat w/ MinGuard A, max vc's for slowing down and sequencing. Completed x5 sit<>stands at edge of mat w/ riser under LLE to promote RLE WBing and NMR. Pt w/ R AFO on for entire session. In standing w/ riser under LLE pt w/ weight shifting, trunk rotation, alternating isometrics for NMR and trunk control. Pt w/ several LOB when rotation to R requiring ModA to correct, but progressed to rotating without LOB. Pt ambulated 50' x2 w/ hemi-walker and minA for weight shifting and maintaining stance on RLE, pt w/ several instances of R knee buckling in stance phase. Pt w/ mat ex's for LLE NMR and strengthening including bridges, supine hip abd, SL hip flex/ext w/ manual resistance. Pt completed SL shoulder flexion/extension with RUE sliding on wash cloth. Pt able to flex shoulder w/ MinA and extend w/ MaxA. Pt ambulated w/ hemi-walker 100' back to room w/ MinA. Pt left seated in w/c w/ all needs within reach.  Therapy Documentation Precautions:  Precautions Precautions: Fall Precaution Comments: Dense R hemi Restrictions Weight Bearing Restrictions: No Pain: Pain Assessment Pain Assessment:  No/denies pain Pain Score: 0-No pain Locomotion : Ambulation Ambulation/Gait Assistance: 4: Min assist   See FIM for current functional status  Therapy/Group: Individual Therapy  Hosie SpangleGodfrey, Vonetta Foulk Hosie SpangleJess Charlton Boule, PT, DPT 01/06/2014, 12:42 PM

## 2014-01-06 NOTE — Progress Notes (Signed)
   Subjective/Complaints: Feels well Good appetite Slept well Objective: Vital Signs: Blood pressure 142/86, pulse 69, temperature 98.4 F (36.9 C), temperature source Oral, resp. rate 16, height 5\' 11"  (1.803 m), weight 217 lb 6 oz (98.6 kg), SpO2 97.00%.  nad Chest- cta cv REG RATE abd- soft, NT, + Bs Ext- no edema  Assessment/Plan: 1. 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  2. DVT Prophylaxis/Anticoagulation: Pharmaceutical: Lovenox. Will check dopplers to rule out DVT.  3. Pain Management: denies pain 4. Mood: Motivated to get better. Will have LCSW follow for evaluation and support.  5. Neuropsych: This patient is capable of making decisions on his own behalf.  6. Skin/Wound Care: Monitor skin with routine checks. Maintain adequate hydration and nutritional status.  7. HTN: will monitor for now BP Readings from Last 3 Encounters:  01/06/14 142/86  bp= 142-161/86-91 8. Dyslipidemia: Continue zocor.    LOS (Days) 10 A FACE TO FACE EVALUATION WAS PERFORMED  Elya Tarquinio HENRY 01/06/2014, 8:49 AM

## 2014-01-07 ENCOUNTER — Inpatient Hospital Stay (HOSPITAL_COMMUNITY): Payer: BC Managed Care – PPO

## 2014-01-07 ENCOUNTER — Encounter (HOSPITAL_COMMUNITY): Payer: BC Managed Care – PPO | Admitting: Occupational Therapy

## 2014-01-07 NOTE — Progress Notes (Signed)
Occupational Therapy Session Note  Patient Details  Name: [name] MRN: [id number] Date of Birth: 06/24/1955  Today's Date: 01/07/2014 OT Individual Time: 1105-1205 OT Individual Time Calculation (min): 60 min   Short Term Goals: Week 2:  OT Short Term Goal 1 (Week 2): Pt will perform LB dressing with Min A seated on edge of bed in order to increase I with self care. OT Short Term Goal 2 (Week 2): Pt will donn bath mitt onto R hand to assist with bathing independently in order to increase I in self care. OT Short Term Goal 3 (Week 2): Pt will donn R shoe with AFO with increased time in order to increased I with dressing task. OT Short Term Goal 4 (Week 2): Pt will stand at sink with supervision from grooming with R UE as stabilizer in order to perform grooming tasks and increase standing balance.   Skilled Therapeutic Interventions/Progress Updates:    ADL retraining with focus on functional transfers, dynamic standing balance, and RUE NMR.  Engaged in bathing at sit > stand level in ADL tub room with setup assist.  Pt ambulated into bathroom with hemiwalker with min assist, as w/c will not fit in bathroom at home.  Tub bench transfer min assist to lift RLE over tub ledge.  Pt donned bath mitt with increased time without assist this session.  Pt utilized lateral leans to wash buttocks due to preference and increased safety awareness.  Able to don socks and shoes this session by crossing leg over opposite knee, with steady assist to maintain RLE crossed.  Donned Rt shoe this session with AFO without assist.  Min/steady assist in standing when pulling pants over hips.  Verbal cues for Rt foot placement with sit > stand to increase equal WB in standing.  Engaged in standing task at table with focus on WB through RUE while completing reaching task to further elicit WB and challenge dynamic standing balance.    Therapy Documentation Precautions:  Precautions Precautions: Fall Precaution  Comments: Dense R hemi Restrictions Weight Bearing Restrictions: No Pain: Pain Assessment Pain Assessment: No/denies pain Pain Score: 3   See FIM for current functional status  Therapy/Group: Individual Therapy  Rosalio LoudHOXIE, Aalivia Mcgraw 01/07/2014, 12:11 PM

## 2014-01-07 NOTE — Progress Notes (Signed)
Physical Therapy Session Note  Patient Details  Name: [name] MRN: [id number] Date of Birth: [birth year]  Today's Date: 01/07/2014 PT Individual Time: 1003-1103 PT Individual Time Calculation (min): 60 min   Short Term Goals: Week 1:  PT Short Term Goal 1 (Week 1): Pt will perform bed mobility with mod I via log roll technique. PT Short Term Goal 1 - Progress (Week 1): Not met PT Short Term Goal 2 (Week 1): Pt will transfer bed <> w/c with supervision.  PT Short Term Goal 2 - Progress (Week 1): Met PT Short Term Goal 3 (Week 1): Pt will propel w/c using L hemi technique with mod I.  PT Short Term Goal 3 - Progress (Week 1): Met PT Short Term Goal 4 (Week 1): Pt will ambulate with max A x 1.  PT Short Term Goal 4 - Progress (Week 1): Met PT Short Term Goal 5 (Week 1): Pt will negotiate 3 stairs using 1 rail and max A x 1. PT Short Term Goal 5 - Progress (Week 1): Met Week 2:  PT Short Term Goal 1 (Week 2): Pt will perform bed mobility with mod I via log roll technique. PT Short Term Goal 2 (Week 2): Pt will transfer bed <> w/c with mod I consistently.   PT Short Term Goal 3 (Week 2): Pt will ambulate 25 ft using LRAD and min A consistently.  PT Short Term Goal 4 (Week 2): Pt will negotiate 3 stairs using 1 rail and min A.  Skilled Therapeutic Interventions/Progress Updates:  Pt. Received seated in w/c and agreeable to therapy session. Room>gym with BUEs and L LE. Orthotic donned.  1:1 Treatment focused on NMReEd: gait, balance, AAROM RLE. Pt. Assist level is min guard assist to Min A with hemi-walker. Treatment consisted of ambulating 75 feet x 2 with hemi-walker use in Left hand with verbal cues for sequencing, hemi-walker positioning and postural control. Pt. Ambulated 86 feet x 2 with one LOB that required Mod Af from therapist to maintain balance. Pt. Ambulated 318 feet total with multiple sit<>stand transfers during session. Pt. Performed static standing on compliant surface for  timed trials of 1 min x 5 reps with use of hemi-walker for additional support. Pt. Responded well to therapy, however will push session beyond advised energy conservation thresholds into unsafe activity participation. Pt. Required several therapeutic rest/breaks during session. Pt. Response:  Pain: No c/o pain post treatment session.  Pt. Condition post therapy session: positioned seated in w/c with all needs within reach.   Therapy Documentation Precautions:  Precautions Precautions: Fall Precaution Comments: Dense R hemi Restrictions Weight Bearing Restrictions: No  Pain: Pain Assessment Pain Score: 3  Mobility:   Locomotion : Ambulation Ambulation/Gait Assistance: 4: Min assist;4: Min guard  Trunk/Postural Assessment :  See FIM for current functional status  Therapy/Group: Individual Therapy  Juluis Mire 01/07/2014, 12:56 PM

## 2014-01-07 NOTE — Progress Notes (Signed)
Recreational Therapy Session Note  Patient Details  Name: [name] MRN: [id number] Date of Birth: 07/25/1955 Today's Date: 01/07/2014  Pain: no c/o Skilled Therapeutic Interventions/Progress Updates: Session focused on activity tolerance, dynamic standing balance, weight shifting, & ambulation using Lite Gait.  Pt ambulated with HW ~40' with min assist & instructional cues for  posture.  Transitioned to ambulation in LiteGait x 104' at .3 mph, then standing to kick a large ball with RLE using HW for UE support with min assist , and then ambulation in LiteGait again x216' at .7mph.  Pt is extremely motivated to regain independence.  Therapy/Group: Co-Treatment  Meriel Kelliher 01/07/2014, 3:51 PM

## 2014-01-07 NOTE — Progress Notes (Signed)
Physical Therapy Session Note  Patient Details  Name: [name] MRN: [id number] Date of Birth: 06/18/1955  Today's Date: 01/07/2014 PT Co-Treatment Time: 1300-1400 PT Co-Treatment Time Calculation (min): 60 min  Short Term Goals: Week 2:  PT Short Term Goal 1 (Week 2): Pt will perform bed mobility with mod I via log roll technique. PT Short Term Goal 2 (Week 2): Pt will transfer bed <> w/c with mod I consistently.   PT Short Term Goal 3 (Week 2): Pt will ambulate 25 ft using LRAD and min A consistently.  PT Short Term Goal 4 (Week 2): Pt will negotiate 3 stairs using 1 rail and min A. Week 3:     Skilled Therapeutic Interventions/Progress Updates: co-treatment with Therapeutic Rec for skilled +2 for dynamic standing activities.  W/c propulsion x 150' modified independent, x 2.  Gait wth HW x 40' with min assist, cues for upright posture, forward gaze, wearing RAFO with difficulty clearing foot.  Gait on LiteGait x 104' at .3 mph, wearing R AFO, focusing on wt shifting to L and R hip and knee flexion.  In static standing with harness still on, pt demonstrated isolated R gluts for hip ext with flexed knee held by PT.  To faciliate R hip and knee fleixion, pt kicked a large ball with R foot x 10.  Lite Gait again X  216' at .7 mph with improved wt shifting and R hip and knee flexion.    Therapy Documentation Precautions:  Precautions Precautions: Fall Precaution Comments: Dense R hemi Restrictions Weight Bearing Restrictions: No Pain: Pain Assessment Pain Assessment: No/denies pain   Locomotion : Ambulation Ambulation/Gait Assistance: 1: +2 Total assist       See FIM for current functional status  Therapy/Group: Co-Treatment  Evely Gainey 01/07/2014, 3:53 PM

## 2014-01-07 NOTE — Progress Notes (Signed)
58 year old RH-male with history of HTN (no meds > 1 year) who was admitted to [facility] on 08/03/015 with two day history of fluctuating RUE weakness progressive to difficulty speaking and some swallowing issues. He reported being under a lot of stress, working 16 days straight and family forced him to seek medical assistance. UDS negative. TSH- 2.54. Hgb A1c- 601. CT head with recent left lenticulostriate infarct  Subjective/Complaints: Right shoulder pain around midnight, relieved with "muscle relaxer" Review of Systems - Negative except R side weak Objective: Vital Signs: Blood pressure 130/75, pulse 67, temperature 98.5 F (36.9 C), temperature source Oral, resp. rate 18, height 5\' 11"  (1.803 m), weight 98.6 kg (217 lb 6 oz), SpO2 100.00%. No results found. No results found for this or any previous visit (from the past 72 hour(s)).   HEENT: normal Cardio: RRR Resp: CTA B/L and unlabored GI: BS positive and NT,ND Extremity:  Pulses positive and No Edema Skin:   Intact Neuro: Alert/Oriented, Normal Sensory and Abnormal Motor trace biceps otherwise 0/5 in RUE,2- hip/knee ext synergy,2- Hip Add and abd,  0/5 in R foot/ankle TOne is reduce RUE  Musc/Skel:  Normal, no pain with R shoulder ROM, neg impingement Gen NAD   Assessment/Plan: 1. 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 Physiatrist is providing close team supervision and 24 hour management of active medical problems listed below. Physiatrist and rehab team continue to assess barriers to discharge/monitor patient progress toward functional and medical goals.  FIM: FIM - Bathing Bathing Steps Patient Completed: Chest;Right Arm;Abdomen;Front perineal area;Buttocks;Right upper leg;Left upper leg Bathing: 3: Mod-Patient completes 5-7 55f 10 parts or 50-74%  FIM - Upper Body Dressing/Undressing Upper  body dressing/undressing steps patient completed: Thread/unthread right sleeve of pullover shirt/dresss;Thread/unthread left sleeve of pullover shirt/dress;Put head through opening of pull over shirt/dress;Pull shirt over trunk Upper body dressing/undressing: 4: Min-Patient completed 75 plus % of tasks FIM - Lower Body Dressing/Undressing Lower body dressing/undressing steps patient completed: Thread/unthread left underwear leg;Thread/unthread left pants leg;Pull pants up/down;Pull underwear up/down;Don/Doff left sock;Don/Doff right sock;Don/Doff left shoe Lower body dressing/undressing: 3: Mod-Patient completed 50-74% of tasks  FIM - Toileting Toileting steps completed by patient: Performs perineal hygiene Toileting Assistive Devices: Grab bar or rail for support Toileting: 3: Mod-Patient completed 2 of 3 steps  FIM - Diplomatic Services operational officer Devices: Elevated toilet seat;Grab bars Toilet Transfers: 4-To toilet/BSC: Min A (steadying Pt. > 75%);4-From toilet/BSC: Min A (steadying Pt. > 75%)  FIM - Bed/Chair Transfer Bed/Chair Transfer Assistive Devices: Arm rests;Orthosis Bed/Chair Transfer: 5: Supine > Sit: Supervision (verbal cues/safety issues);5: Sit > Supine: Supervision (verbal cues/safety issues);4: Bed > Chair or W/C: Min A (steadying Pt. > 75%);4: Chair or W/C > Bed: Min A (steadying Pt. > 75%)  FIM - Locomotion: Wheelchair Distance: 180 Locomotion: Wheelchair: 6: Travels 150 ft or more, turns around, maneuvers to table, bed or toilet, negotiates 3% grade: maneuvers on rugs and over door sills independently FIM - Locomotion: Ambulation Locomotion: Ambulation Assistive Devices: Walker - Hemi;Orthosis (R AFO) Ambulation/Gait Assistance: 4: Min assist Locomotion: Ambulation: 2: Travels 50 - 149 ft with minimal assistance (Pt.>75%)  Comprehension Comprehension Mode: Auditory Comprehension: 6-Follows complex conversation/direction: With extra time/assistive  device  Expression Expression Mode: Verbal Expression: 6-Expresses complex ideas: With extra time/assistive device  Social Interaction Social Interaction: 7-Interacts appropriately with others - No medications needed.  Problem Solving Problem Solving: 6-Solves complex problems:  With extra time  Memory Memory: 6-More than reasonable amt of time  Medical Problem List and Plan:  1. 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  2. DVT Prophylaxis/Anticoagulation: Pharmaceutical: Lovenox. Will check dopplers to rule out DVT.  3. Pain Management: N/A , R shoulder pain intermittent , cont ROM, no injection needed at this time 4. Mood: Motivated to get better. Will have LCSW follow for evaluation and support.  5. Neuropsych: This patient is capable of making decisions on his own behalf.  6. Skin/Wound Care: Monitor skin with routine checks. Maintain adequate hydration and nutritional status.  7. HTN: Will monitor BP every 8 hours. Avoid hypotension to allow adequate perfusion.  8. Dyslipidemia: Continue zocor.    LOS (Days) 11 A FACE TO FACE EVALUATION WAS PERFORMED  Micaila Ziemba E 01/07/2014, 7:18 AM

## 2014-01-07 NOTE — Progress Notes (Signed)
Uses urinal at night, with staff emptying. PRN robaxin given at 2246 for complaint of right shoulder pain.Thomas MartinezMurray, [name]

## 2014-01-08 ENCOUNTER — Encounter (HOSPITAL_COMMUNITY): Payer: BC Managed Care – PPO | Admitting: Occupational Therapy

## 2014-01-08 ENCOUNTER — Inpatient Hospital Stay (HOSPITAL_COMMUNITY): Payer: BC Managed Care – PPO | Admitting: Occupational Therapy

## 2014-01-08 ENCOUNTER — Inpatient Hospital Stay (HOSPITAL_COMMUNITY): Payer: BC Managed Care – PPO | Admitting: *Deleted

## 2014-01-08 DIAGNOSIS — I10 Essential (primary) hypertension: Secondary | ICD-10-CM

## 2014-01-08 DIAGNOSIS — I6330 Cerebral infarction due to thrombosis of unspecified cerebral artery: Secondary | ICD-10-CM

## 2014-01-08 NOTE — Progress Notes (Signed)
Physical Therapy Session Note  Patient Details  Name: [name] MRN: [id number] Date of Birth: 03/27/1956  Today's Date: 01/08/2014 PT Individual Time: 0830-0930 PT Individual Time Calculation (min): 60 min   Short Term Goals: Week 2:  PT Short Term Goal 1 (Week 2): Pt will perform bed mobility with mod I via log roll technique. PT Short Term Goal 2 (Week 2): Pt will transfer bed <> w/c with mod I consistently.   PT Short Term Goal 3 (Week 2): Pt will ambulate 25 ft using LRAD and min A consistently.  PT Short Term Goal 4 (Week 2): Pt will negotiate 3 stairs using 1 rail and min A.  Skilled Therapeutic Interventions/Progress Updates:   Skilled co-treatment with recreational therapist. Pt received sitting in w/c, ready for therapy. Mod I with w/c mobility, 2 x 150 ft. Pt instructed in self stretching program for hip flexor, ankle DF, hamstrings in supine and sitting with use of theraband due to increased RLE tightness limiting patient's functional mobility with ambulation. Pt performed RUE/LE NMR on mat table in tall kneeling with UE WB on bench and quadruped with therapist facilitating RUE WB and RT facilitating hips over knees with LUE reaching activity anterior and across midline. NMR in standing to facilitate weight shift to L for improved RLE clearance during gait, multidirectional reaching to grasp object with LUE and reaching anterior/left to place in target, min A for standing balance. Pt returned to room, handoff with OT.   Therapy Documentation Precautions:  Precautions Precautions: Fall Precaution Comments: Dense R hemi Restrictions Weight Bearing Restrictions: No Pain: Pain Assessment Pain Assessment: No/denies pain Pain Score: 0-No pain  See FIM for current functional status  Therapy/Group: Individual Therapy  [name], Derwood Becraft A 01/08/2014, 10:42 AM

## 2014-01-08 NOTE — Progress Notes (Signed)
Recreational Therapy Session Note  Patient Details  Name: [name] MRN: [id number] Date of Birth: 09/03/1955 Today's Date: 01/08/2014  Pain: no c/o Skilled Therapeutic Interventions/Progress Updates: Session focused on activity tolerance, w/c mobility, squat pivot transfers, education of self stretching program dynamic standing balance, weight shifting.  Pt propelled w/c to gym with Mod I.  Pt performed tall kneeling  & quadruped activities with min- mod assist.  Pt stood for modified basketball activity using beanbags reaching outside BOS with min assist.  Therapy/Group: Co-Treatment   Shawntia Mangal 01/08/2014, 3:06 PM

## 2014-01-08 NOTE — Progress Notes (Signed)
Occupational Therapy Session Note  Patient Details  Name: Nelva Naynthony Carnegie MRN: [id number] Date of Birth: 11/06/1955  Today's Date: 01/08/2014 OT Individual Time: 0930-1030 OT Individual Time Calculation (min): 60 min   Short Term Goals: Week 2:  OT Short Term Goal 1 (Week 2): Pt will perform LB dressing with Min A seated on edge of bed in order to increase I with self care. OT Short Term Goal 2 (Week 2): Pt will donn bath mitt onto R hand to assist with bathing independently in order to increase I in self care. OT Short Term Goal 3 (Week 2): Pt will donn R shoe with AFO with increased time in order to increased I with dressing task. OT Short Term Goal 4 (Week 2): Pt will stand at sink with supervision from grooming with R UE as stabilizer in order to perform grooming tasks and increase standing balance.   Skilled Therapeutic Interventions/Progress Updates:    Pt performed bathing and dressing in the ADL apartment this session.  He was able to ambulate into the bathroom with min assist using the quad cane.  Min assist for all sit to stand transitions for undressing as well as dressing post shower.  Pt also needing min assist for lifting the RLE over the edge of the tub when getting into the shower but he was able to lift it out without assistance.  Max hand over hand assistance for washing the LUE using the RUE.  He attempts to perform self hand over hand assistance using the wash mit on the right hand as well.  Pt given max assist for crossing the RLE over the left knee and maintaining when attempting to donn underpants, socks, and shoe.  Mod instructional cueing for pt to be sure and place his RLE back and even with his shoulders during sit to stand to increase weightbearing as well.  Therapy Documentation Precautions:  Precautions Precautions: Fall Precaution Comments: Dense R hemi Restrictions Weight Bearing Restrictions: No  Pain: Pain Assessment Pain Assessment: No/denies pain Pain  Score: 0-No pain ADL: See FIM for current functional status  Therapy/Group: Individual Therapy  Miller Edgington OTR/L 01/08/2014, 10:53 AM

## 2014-01-08 NOTE — Progress Notes (Signed)
Occupational Therapy Session Note  Patient Details  Name: [name] MRN: [id number] Date of Birth: 12/02/1955  Today's Date: 01/08/2014 OT Individual Time: [id number] OT Individual Time Calculation (min): 62 min   Short Term Goals: Week 2:  OT Short Term Goal 1 (Week 2): Pt will perform LB dressing with Min A seated on edge of bed in order to increase I with self care. OT Short Term Goal 2 (Week 2): Pt will donn bath mitt onto R hand to assist with bathing independently in order to increase I in self care. OT Short Term Goal 3 (Week 2): Pt will donn R shoe with AFO with increased time in order to increased I with dressing task. OT Short Term Goal 4 (Week 2): Pt will stand at sink with supervision from grooming with R UE as stabilizer in order to perform grooming tasks and increase standing balance.   Skilled Therapeutic Interventions/Progress Updates:  Patient resting in recliner upon arrival with his sister visiting.  Engaged in toilet and w/c transfers,  RUE SROM exercises and transitional movements.  Focused session on slow and controlled movements as well as forced use of RUE and RLE, forward weight shifts during sit><stands, squats with lateral weight shifts to right and decreased overuse of LUE & LLE during all transitional movements.  Therapy Documentation Precautions:  Precautions Precautions: Fall Precaution Comments: Dense R hemi Restrictions Weight Bearing Restrictions: No Pain: Denies pain See FIM for current functional status  Therapy/Group: Individual Therapy  Yvetta Drotar 01/08/2014, 5:51 PM

## 2014-01-08 NOTE — Progress Notes (Signed)
58 year old RH-male with history of HTN (no meds > 1 year) who was admitted to [facility] on 08/03/015 with two day history of fluctuating RUE weakness progressive to difficulty speaking and some swallowing issues. He reported being under a lot of stress, working 16 days straight and family forced him to seek medical assistance. UDS negative. TSH- 2.54. Hgb A1c- 601. CT head with recent left lenticulostriate infarct  Subjective/Complaints: No new issues. Happy with progress. Review of Systems - Negative except R side weak Objective: Vital Signs: Blood pressure 149/82, pulse 70, temperature 98 F (36.7 C), temperature source Oral, resp. rate 18, height 5\' 11"  (1.803 m), weight 98.6 kg (217 lb 6 oz), SpO2 98.00%. No results found. No results found for this or any previous visit (from the past 72 hour(s)).   HEENT: normal Cardio: RRR Resp: CTA B/L and unlabored GI: BS positive and NT,ND Extremity:  Pulses positive and No Edema Skin:   Intact Neuro: Alert/Oriented, Normal Sensory and Abnormal Motor trace deltoid and biceps otherwise 0/5 in RUE,2- hip/knee ext synergy,2- Hip Add and abd,  0/5 in R foot/ankle TOne is reduce RUE  Musc/Skel:  Normal, no pain with R shoulder ROM, neg impingement Gen NAD   Assessment/Plan: 1. 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 Physiatrist is providing close team supervision and 24 hour management of active medical problems listed below. Physiatrist and rehab team continue to assess barriers to discharge/monitor patient progress toward functional and medical goals.  FIM: FIM - Bathing Bathing Steps Patient Completed: Chest;Right Arm;Abdomen;Front perineal area;Buttocks;Right upper leg;Left upper leg;Right lower leg (including foot);Left lower leg (including foot) Bathing: 4: Min-Patient completes 8-9 4561f 10 parts or 75+  percent  FIM - Upper Body Dressing/Undressing Upper body dressing/undressing steps patient completed: Thread/unthread right sleeve of pullover shirt/dresss;Thread/unthread left sleeve of pullover shirt/dress;Put head through opening of pull over shirt/dress;Pull shirt over trunk Upper body dressing/undressing: 4: Min-Patient completed 75 plus % of tasks FIM - Lower Body Dressing/Undressing Lower body dressing/undressing steps patient completed: Thread/unthread left underwear leg;Thread/unthread left pants leg;Pull pants up/down;Pull underwear up/down;Don/Doff left sock;Don/Doff right sock;Don/Doff left shoe;Thread/unthread right underwear leg;Thread/unthread right pants leg;Don/Doff right shoe Lower body dressing/undressing: 4: Min-Patient completed 75 plus % of tasks  FIM - Toileting Toileting steps completed by patient: Performs perineal hygiene Toileting Assistive Devices: Grab bar or rail for support Toileting: 3: Mod-Patient completed 2 of 3 steps  FIM - Diplomatic Services operational officerToilet Transfers Toilet Transfers Assistive Devices: Elevated toilet seat;Grab bars Toilet Transfers: 4-To toilet/BSC: Min A (steadying Pt. > 75%);4-From toilet/BSC: Min A (steadying Pt. > 75%)  FIM - Bed/Chair Transfer Bed/Chair Transfer Assistive Devices: Arm rests;Orthosis Bed/Chair Transfer: 5: Chair or W/C > Bed: Supervision (verbal cues/safety issues);5: Bed > Chair or W/C: Supervision (verbal cues/safety issues)  FIM - Locomotion: Wheelchair Distance: 180 Locomotion: Wheelchair: 6: Travels 150 ft or more, turns around, maneuvers to table, bed or toilet, negotiates 3% grade: maneuvers on rugs and over door sills independently FIM - Locomotion: Ambulation Locomotion: Ambulation Assistive Devices: Lite Gait Ambulation/Gait Assistance: 1: +2 Total assist Locomotion: Ambulation: 1: Two helpers  Comprehension Comprehension Mode: Auditory Comprehension: 6-Follows complex conversation/direction: With extra time/assistive  device  Expression Expression Mode: Verbal Expression: 6-Expresses complex ideas: With extra time/assistive device  Social Interaction Social Interaction: 7-Interacts appropriately with others - No medications needed.  Problem Solving Problem Solving: 6-Solves complex problems: With extra time  Memory Memory: 6-More than reasonable  amt of time  Medical Problem List and Plan:  1. 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  2. DVT Prophylaxis/Anticoagulation: Pharmaceutical: Lovenox. Will check dopplers to rule out DVT.  3. Pain Management: N/A , R shoulder pain intermittent , cont ROM, no injection needed at this time 4. Mood: Motivated to get better. Will have LCSW follow for evaluation and support.  5. Neuropsych: This patient is capable of making decisions on his own behalf.  6. Skin/Wound Care: Monitor skin with routine checks. Maintain adequate hydration and nutritional status.  7. HTN: bp's tolerable.  8. Dyslipidemia: Continue zocor.    LOS (Days) 12 A FACE TO FACE EVALUATION WAS PERFORMED  Delicia Berens T 01/08/2014, 8:37 AM

## 2014-01-09 ENCOUNTER — Inpatient Hospital Stay (HOSPITAL_COMMUNITY): Payer: BC Managed Care – PPO | Admitting: Occupational Therapy

## 2014-01-09 ENCOUNTER — Inpatient Hospital Stay (HOSPITAL_COMMUNITY): Payer: BC Managed Care – PPO | Admitting: Rehabilitation

## 2014-01-09 NOTE — Progress Notes (Signed)
Physical Therapy Session Note  Patient Details  Name: [name] MRN: [id number] Date of Birth: January 13, 1956  Today's Date: 01/09/2014 PT Individual Time: 1400-1500 PT Individual Time Calculation (min): 60 min   Short Term Goals: Week 1:  PT Short Term Goal 1 (Week 1): Pt will perform bed mobility with mod I via log roll technique. PT Short Term Goal 1 - Progress (Week 1): Not met PT Short Term Goal 2 (Week 1): Pt will transfer bed <> w/c with supervision.  PT Short Term Goal 2 - Progress (Week 1): Met PT Short Term Goal 3 (Week 1): Pt will propel w/c using L hemi technique with mod I.  PT Short Term Goal 3 - Progress (Week 1): Met PT Short Term Goal 4 (Week 1): Pt will ambulate with max A x 1.  PT Short Term Goal 4 - Progress (Week 1): Met PT Short Term Goal 5 (Week 1): Pt will negotiate 3 stairs using 1 rail and max A x 1. PT Short Term Goal 5 - Progress (Week 1): Met  Skilled Therapeutic Interventions/Progress Updates:   Pt received sitting in w/c in room, having just finished with OT session.  Pt self propelled to therapy gym at Mod I level.  Skilled session focused on gait training with use of LBQC and R AFO to determine safety for functional ambulation at D/C.  Also focused on NMR for increased activation on R hamstrings.  Performed gait x 50' x 2 and 45' x 1 reps with use of LBQC and R AFO at min A level.  Provided manual assist at L hip to prevent vaulting, as well as assist at L heel to ensure it remained on floor when advancing RLE.  Cues for increased knee flex during swing, as he tended to ambulate with straight leg initially.  Also added surgical shoe cover to R shoe to assist with foot clearance with slight improvement noted.  Remainder of session focused on seated and supine NMR to increase activation of R hamstrings to carryover to gait.  Performed seated hamstring curl with R foot on small ball w/ use of mirror for increased visual feedback.  Note that there is slight  activation on hamstrings, however he is unable to sustain.  Also performed supine hamstring curl with BLE on physioball>RLE on ball with assist and then bridging with BLE on ball w/ hamstring curl (this was very difficult and therapist providing mod assist for equal BLE movement. Pt ambulated back to w/c then in hallway as stated above.  Pt left in w/c in room with all needs in reach.  Discussed using Bioness during next visit for hamstring stimulation.  Pt agreeable.   Therapy Documentation Precautions:  Precautions Precautions: Fall Precaution Comments: Dense R hemi Restrictions Weight Bearing Restrictions: No   Vital Signs: Therapy Vitals Temp: 98.1 F (36.7 C) Temp src: Oral Pulse Rate: 72 Resp: 18 BP: 125/83 mmHg Patient Position (if appropriate): Sitting Oxygen Therapy SpO2: 100 % O2 Device: None (Room air) Pain:Pt with no c/o pain during session.    Locomotion : Ambulation Ambulation/Gait Assistance: 4: Min Financial controller Distance: 150   See FIM for current functional status  Therapy/Group: Individual Therapy  Denice Bors 01/09/2014, 4:54 PM

## 2014-01-09 NOTE — Progress Notes (Signed)
58 year old RH-male with history of HTN (no meds > 1 year) who was admitted to [facility] on 08/03/015 with two day history of fluctuating RUE weakness progressive to difficulty speaking and some swallowing issues. He reported being under a lot of stress, working 16 days straight and family forced him to seek medical assistance. UDS negative. TSH- 2.54. Hgb A1c- 601. CT head with recent left lenticulostriate infarct  Subjective/Complaints: Right side moving better Review of Systems - Negative except R side weak Objective: Vital Signs: Blood pressure 134/87, pulse 70, temperature 98.1 F (36.7 C), temperature source Oral, resp. rate 18, height 5\' 11"  (1.803 m), weight 98.6 kg (217 lb 6 oz), SpO2 100.00%. No results found. No results found for this or any previous visit (from the past 72 hour(s)).   HEENT: normal Cardio: RRR Resp: CTA B/L and unlabored GI: BS positive and NT,ND Extremity:  Pulses positive and No Edema Skin:   Intact Neuro: Alert/Oriented, Normal Sensory and Abnormal Motor trace biceps otherwise 0/5 in RUEexcept 2- R triceps,2- R HF, 2- hip/knee ext synergy,2- Hip Add and abd,  0/5 in R foot/ankle Tone is reduce RUE  Musc/Skel:  Normal, no pain with R shoulder ROM,     Gen NAD   Assessment/Plan: 1. 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 Physiatrist is providing close team supervision and 24 hour management of active medical problems listed below. Physiatrist and rehab team continue to assess barriers to discharge/monitor patient progress toward functional and medical goals.  FIM: FIM - Bathing Bathing Steps Patient Completed: Chest;Right Arm;Abdomen;Front perineal area;Buttocks;Right upper leg;Left upper leg;Right lower leg (including foot);Left lower leg (including foot) Bathing: 4: Min-Patient completes 8-9 14f 10 parts or 75+ percent  FIM  - Upper Body Dressing/Undressing Upper body dressing/undressing steps patient completed: Thread/unthread right sleeve of pullover shirt/dresss;Thread/unthread left sleeve of pullover shirt/dress;Put head through opening of pull over shirt/dress;Pull shirt over trunk Upper body dressing/undressing: 5: Supervision: Safety issues/verbal cues FIM - Lower Body Dressing/Undressing Lower body dressing/undressing steps patient completed: Thread/unthread left underwear leg;Thread/unthread left pants leg;Pull pants up/down;Pull underwear up/down;Don/Doff left sock;Don/Doff left shoe Lower body dressing/undressing: 3: Mod-Patient completed 50-74% of tasks  FIM - Toileting Toileting steps completed by patient: Performs perineal hygiene Toileting Assistive Devices: Grab bar or rail for support Toileting: 3: Mod-Patient completed 2 of 3 steps  FIM - Diplomatic Services operational officer Devices: Elevated toilet seat;Grab bars Toilet Transfers: 4-To toilet/BSC: Min A (steadying Pt. > 75%);4-From toilet/BSC: Min A (steadying Pt. > 75%)  FIM - Bed/Chair Transfer Bed/Chair Transfer Assistive Devices: Arm rests;Orthosis Bed/Chair Transfer: 4: Bed > Chair or W/C: Min A (steadying Pt. > 75%);4: Chair or W/C > Bed: Min A (steadying Pt. > 75%)  FIM - Locomotion: Wheelchair Distance: 150 Locomotion: Wheelchair: 6: Travels 150 ft or more, turns around, maneuvers to table, bed or toilet, negotiates 3% grade: maneuvers on rugs and over door sills independently FIM - Locomotion: Ambulation Locomotion: Ambulation Assistive Devices: Lite Gait Ambulation/Gait Assistance: 1: +2 Total assist Locomotion: Ambulation: 1: Travels less than 50 ft with minimal assistance (Pt.>75%)  Comprehension Comprehension Mode: Auditory Comprehension: 6-Follows complex conversation/direction: With extra time/assistive device  Expression Expression Mode: Verbal Expression: 6-Expresses complex ideas: With extra time/assistive  device  Social Interaction Social Interaction: 7-Interacts appropriately with others - No medications needed.  Problem Solving Problem Solving: 6-Solves complex problems: With extra time  Memory Memory: 6-More than reasonable  amt of time  Medical Problem List and Plan:  1. 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  2. DVT Prophylaxis/Anticoagulation: Pharmaceutical: Lovenox.  3. Pain Management: N/A , R shoulder pain intermittent , cont ROM, no injection needed at this time 4. Mood: Motivated to get better. Will have LCSW follow for evaluation and support.  5. Neuropsych: This patient is capable of making decisions on his own behalf.  6. Skin/Wound Care: Monitor skin with routine checks. Maintain adequate hydration and nutritional status.  7. HTN: Will monitor BP every 8 hours. Avoid hypotension to allow adequate perfusion.  8. Dyslipidemia: Continue zocor.    LOS (Days) 13 A FACE TO FACE EVALUATION WAS PERFORMED  KIRSTEINS,ANDREW E 01/09/2014, 7:06 AM

## 2014-01-09 NOTE — Progress Notes (Signed)
Social Work Patient ID: [name], male   DOB: [birth year], 58 y.o.   MRN: [id number] Met with pt to inform of team conference progression toward his goals and discharge 8/26, still.  He is doing well and getting some movement back in his arm and leg, which Encourages him.. Team has informed him to not over do it with exercises in between therapies, so not overly tired.  He understands but is very motivated to improve. Discussed OP therapies he reports his sister can transport him to in [place].  Continue to work on discharge needs.

## 2014-01-09 NOTE — Progress Notes (Signed)
Occupational Therapy Session Note  Patient Details  Name: [name] MRN: [id number] Date of Birth: 08/02/1955  Today's Date: 01/09/2014 OT Individual Time: [id number] and 1255-1355 OT Individual Time Calculation (min): 60 min and 60 min   Short Term Goals: Week 2:  OT Short Term Goal 1 (Week 2): Pt will perform LB dressing with Min A seated on edge of bed in order to increase I with self care. OT Short Term Goal 2 (Week 2): Pt will donn bath mitt onto R hand to assist with bathing independently in order to increase I in self care. OT Short Term Goal 3 (Week 2): Pt will donn R shoe with AFO with increased time in order to increased I with dressing task. OT Short Term Goal 4 (Week 2): Pt will stand at sink with supervision from grooming with R UE as stabilizer in order to perform grooming tasks and increase standing balance.   Skilled Therapeutic Interventions/Progress Updates:    Session 1 [phone])- Upon entering the room, pt seated on EOB awaiting therapist arrival. Pt with no c/o pain this session. Pt performed sit to stand from bed with close supervision. Pt ambulated into bathroom for tub transfer with Min A. Bathing performed in shower with Min A to wash L arm and R foot. Pt ambulated to bed to perform dressing tasks seated EOB as preferred by pt. Pt requires assistance for crossing R foot to left knee as well as to hold it there for pt to thread clothing. UB dressing performed with set up A as pt utilizes hemiplegia dressing technique. Pt standing at sink side for grooming while engaging in 5 minutes of weight bearing through R UE for input during functional task. Pt reports , "I feel stronger."   Session 2 ([id number])- Upon entering the room, pt seated in wheelchair awaiting therapist. Pt with no c/o pain this session. Pt propelled wheelchair to ADL apartment with L UE and LE with increased time. Pt ambulated into bathroom to perform tub transfer onto bench with tub/shower combo. Pt  required Min A to get R LE into tub. OT educated pt on safety concerns within the bathroom in order to decrease fall risk and increase safety such as 1 no slip bathmat, removal of all other bath mats, removal of rubber mat in tub, and recommended purchase of steady treads for inside of tub. Pt performed bed mobility with supervision on standard height bed. Pt ambulated with use of hemi walker into kitchen in order to perform weight bearing for ~ 8 minutes through R UE while L UE performing functional task of obtaining items from kitchen cabinet such as snacks, cups, and plates. Pt required verbal cues to pick up one item at a time for safety. Pt propelled wheelchair to day room in order to perform self ROM table slides for shoulder flexion, internal rotation, and external rotation x 20 reps each. Pt able to actively perform internal rotation but required assist for L UE for other exercises. Pt required rest break secondary to fatigue.   Therapy Documentation Precautions:  Precautions Precautions: Fall Precaution Comments: Dense R hemi Restrictions Weight Bearing Restrictions: No  See FIM for current functional status  Therapy/Group: Individual Therapy  Lowella Gripittman, Katlin Bortner L 01/09/2014, 11:42 AM

## 2014-01-09 NOTE — Patient Care Conference (Signed)
Inpatient RehabilitationTeam Conference and Plan of Care Update Date: 01/09/2014   Time: 11;30 AM    Patient Name: [name]      Medical Record Number: [id number]  Date of Birth: Sep 30, 1955 Sex: Male         Room/Bed: 4M02C/4M02C-01 Payor Info: Payor: BLUE Merchant navy officer / Plan: BCBS PPO OUT OF STATE / Product Type: *No Product type* /    Admitting Diagnosis: CVA  Admit Date/Time:  12/27/2013  1:28 PM Admission Comments: No comment available   Primary Diagnosis:  <principal problem not specified> Principal Problem: <principal problem not specified>  Patient Active Problem List   Diagnosis Date Noted  . CVA (cerebral infarction) 12/27/2013    Expected Discharge Date: Expected Discharge Date: 01/16/14  Team Members Present: Physician leading conference: Dr. Claudette Laws Social Worker Present: Dossie Der, LCSW Nurse Present: Tennis Must, RN PT Present: Harriet Butte, PT OT Present: Perrin Maltese, OT SLP Present: Jackalyn Lombard, SLP PPS Coordinator present : Edson Snowball, PT     Current Status/Progress Goal Weekly Team Focus  Medical   Severe hemiparesis now with some emerging upper and lower extremity strength  Maximize neuromuscular recovery  Focus on proximal upper extremity and proximal lower extremity strength   Bowel/Bladder   continent bowel and bladder; LBM 8/17; uses urinal  remain continent with regular bowel emptying pattern  monitor output and bowel pattern   Swallow/Nutrition/ Hydration     WFL        ADL's   min assist for bathing, dressing, toileting, and tub transfers, mod assist for LB dressing needs assist to get  the RLE crossed over the left knee and maintain for dressing tasks.  Brunnstrum stage II in the right arm.  min assist level overall  selfcare retraining, neuromuscular re-education, pt/family education, balance retraining, DME education   Mobility   Min A short distance gait with hemi walker, supervision bed mobility, S-min A transfers,  mod I w/c mobility, min-mod A stairs  Mod I to Min A  R NMR, dynamic standing balance, functional mobility   Communication     [facility]        Safety/Cognition/ Behavioral Observations    No unsafe behaviors        Pain   mild pain R shoulder in evenings tylenol/robaxin @ HS for relief  pain less than 3  assess pain periodically and treat accordingly, offer alternative pain relief methods    Skin   skin CDI  skin remains intact  assess skin q shift, encourage turn/reposition      *See Care Plan and progress notes for long and short-term goals.  Barriers to Discharge: Still requires physical assistance    Possible Resolutions to Barriers:  Continue therapy, training caregiver for discharge in 1 week    Discharge Planning/Teaching Needs:  Home with wife who is taking a FMLA to provide assist to pt.      Team Discussion:  Progressing nicely getting return in arm and leg, pt exercises outside of therapies-told not to over do it. SP to dc when dc from hospital-no follow up SP needed. R-shoulder pain meds helping.  Revisions to Treatment Plan:  NOne   Continued Need for Acute Rehabilitation Level of Care: The patient requires daily medical management by a physician with specialized training in physical medicine and rehabilitation for the following conditions: Daily direction of a multidisciplinary physical rehabilitation program to ensure safe treatment while eliciting the highest outcome that is of practical value to the patient.:  Yes Daily medical management of patient stability for increased activity during participation in an intensive rehabilitation regime.: Yes Daily analysis of laboratory values and/or radiology reports with any subsequent need for medication adjustment of medical intervention for : Neurological problems  [name], [name] 01/09/2014, 12:43 PM

## 2014-01-10 ENCOUNTER — Inpatient Hospital Stay (HOSPITAL_COMMUNITY): Payer: BC Managed Care – PPO | Admitting: Rehabilitation

## 2014-01-10 ENCOUNTER — Inpatient Hospital Stay (HOSPITAL_COMMUNITY): Payer: BC Managed Care – PPO | Admitting: Occupational Therapy

## 2014-01-10 ENCOUNTER — Encounter (HOSPITAL_COMMUNITY): Payer: BC Managed Care – PPO | Admitting: Occupational Therapy

## 2014-01-10 DIAGNOSIS — G8110 Spastic hemiplegia affecting unspecified side: Secondary | ICD-10-CM

## 2014-01-10 DIAGNOSIS — I6330 Cerebral infarction due to thrombosis of unspecified cerebral artery: Secondary | ICD-10-CM

## 2014-01-10 DIAGNOSIS — I10 Essential (primary) hypertension: Secondary | ICD-10-CM

## 2014-01-10 NOTE — Progress Notes (Signed)
Recreational Therapy Session Note  Patient Details  Name: [name] MRN: [id number] Date of Birth: 10/25/1955 Today's Date: 01/10/2014  LATE ENTRY from 01/09/2014 Pt participated in animal assisted activity/therapy seated w/c level with supervision  Maleak Brazzel 01/10/2014, 7:59 AM

## 2014-01-10 NOTE — Progress Notes (Signed)
58 year old RH-male with history of HTN (no meds > 1 year) who was admitted to [facility] on 08/03/015 with two day history of fluctuating RUE weakness progressive to difficulty speaking and some swallowing issues. He reported being under a lot of stress, working 16 days straight and family forced him to seek medical assistance. UDS negative. TSH- 2.54. Hgb A1c- 601. CT head with recent left lenticulostriate infarct  Subjective/Complaints: Right side Thigh feeling stronger No new issues Review of Systems - Negative except R side weak Objective: Vital Signs: Blood pressure 129/77, pulse 72, temperature 98.5 F (36.9 C), temperature source Oral, resp. rate 18, height 5\' 11"  (1.803 m), weight 98.6 kg (217 lb 6 oz), SpO2 99.00%. No results found. No results found for this or any previous visit (from the past 72 hour(s)).   HEENT: normal Cardio: RRR Resp: CTA B/L and unlabored GI: BS positive and NT,ND Extremity:  Pulses positive and No Edema Skin:   Intact Neuro: Alert/Oriented, Normal Sensory and Abnormal Motor trace biceps otherwise 0/5 in RUEexcept 2- R triceps,2- R HF, 2- hip/knee ext synergy,2- Hip Add and abd,  0/5 in R foot/ankle Tone is reduce RUE  Musc/Skel:  Normal, no pain with R shoulder ROM,     Gen NAD   Assessment/Plan: 1. 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 Physiatrist is providing close team supervision and 24 hour management of active medical problems listed below. Physiatrist and rehab team continue to assess barriers to discharge/monitor patient progress toward functional and medical goals.  FIM: FIM - Bathing Bathing Steps Patient Completed: Chest;Right Arm;Abdomen;Front perineal area;Buttocks;Right upper leg;Left upper leg;Left lower leg (including foot) Bathing: 4: Min-Patient completes 8-9 4f 10 parts or 75+ percent  FIM - Upper  Body Dressing/Undressing Upper body dressing/undressing steps patient completed: Thread/unthread right sleeve of pullover shirt/dresss;Thread/unthread left sleeve of pullover shirt/dress;Put head through opening of pull over shirt/dress;Pull shirt over trunk Upper body dressing/undressing: 5: Supervision: Safety issues/verbal cues FIM - Lower Body Dressing/Undressing Lower body dressing/undressing steps patient completed: Thread/unthread left underwear leg;Thread/unthread left pants leg;Pull pants up/down;Pull underwear up/down;Don/Doff left sock;Don/Doff left shoe Lower body dressing/undressing: 3: Mod-Patient completed 50-74% of tasks  FIM - Toileting Toileting steps completed by patient: Performs perineal hygiene Toileting Assistive Devices: Grab bar or rail for support Toileting: 3: Mod-Patient completed 2 of 3 steps  FIM - Diplomatic Services operational officer Devices: Elevated toilet seat Toilet Transfers: 4-From toilet/BSC: Min A (steadying Pt. > 75%);4-To toilet/BSC: Min A (steadying Pt. > 75%)  FIM - Bed/Chair Transfer Bed/Chair Transfer Assistive Devices: Arm rests;Orthosis;Cane Bed/Chair Transfer: 4: Bed > Chair or W/C: Min A (steadying Pt. > 75%);4: Chair or W/C > Bed: Min A (steadying Pt. > 75%)  FIM - Locomotion: Wheelchair Distance: 150 Locomotion: Wheelchair: 6: Travels 150 ft or more, turns around, maneuvers to table, bed or toilet, negotiates 3% grade: maneuvers on rugs and over door sills independently FIM - Locomotion: Ambulation Locomotion: Ambulation Assistive Devices: Occupational hygienist Ambulation/Gait Assistance: 4: Min assist Locomotion: Ambulation: 2: Travels 50 - 149 ft with minimal assistance (Pt.>75%)  Comprehension Comprehension Mode: Auditory Comprehension: 6-Follows complex conversation/direction: With extra time/assistive device  Expression Expression Mode: Verbal Expression: 6-Expresses complex ideas: With extra time/assistive device  Social  Interaction Social Interaction: 7-Interacts appropriately with others - No medications needed.  Problem Solving Problem Solving: 6-Solves complex problems: With extra time  Memory Memory: 6-More than reasonable amt  of time  Medical Problem List and Plan:  1. 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  2. DVT Prophylaxis/Anticoagulation: Pharmaceutical: Lovenox.  3. Pain Management: N/A , R shoulder pain intermittent , cont ROM, no injection needed at this time 4. Mood: Motivated to get better. Will have LCSW follow for evaluation and support.  5. Neuropsych: This patient is capable of making decisions on his own behalf.  6. Skin/Wound Care: Monitor skin with routine checks. Maintain adequate hydration and nutritional status.  7. HTN: Will monitor BP every 8 hours. Avoid hypotension to allow adequate perfusion.  8. Dyslipidemia: Continue zocor.    LOS (Days) 14 A FACE TO FACE EVALUATION WAS PERFORMED  KIRSTEINS,ANDREW E 01/10/2014, 6:47 AM

## 2014-01-10 NOTE — Progress Notes (Signed)
Orthopedic Tech Progress Note Patient Details:  [name] 12/14/1955 [id number] Advanced called to place brace order Patient ID: [name], male   DOB: 04/03/1956, 58 y.o.   MRN: [id number]   [name] 01/10/2014, 2:29 PM

## 2014-01-10 NOTE — Progress Notes (Signed)
Physical Therapy Session Note  Patient Details  Name: [name] MRN: [id number] Date of Birth: 07/25/1955  Today's Date: 01/10/2014 PT Individual Time: 1410-1440 PT Individual Time Calculation (min): 30 min   Short Term Goals: Week 3:  PT Short Term Goal 1 (Week 3): =LTG's due to ELOS  Skilled Therapeutic Interventions/Progress Updates:   Pt received sitting in w/c in room, agreeable to therapy session.  Self propelled w/c to/from therapy gym at Mod I level.  Skilled session focused on NMR for RUE and [facility] with use of R AFO to assess whether earlier estim would carryover for hamstring activation.  Performed SL on RUE with therapist supporting under trunk/ribs to grade amount of WB through R shoulder for safety while pt performed reaching activities to also engage trunk movements.  Transitioned to SL with LUE/LE elevated while therapist provided external perturbations to increase trunk control.  Ended session with gait training x 30' x 2 reps at min A with [facility] with continued facilitation for decreased vaulting with downward pressure on L hip and assist at L heel to maintain contact with ground.  Pt tolerated well and did note slight increased hamstring activation, leading to decreased energy expenditure during gait.  Pt left in w/c in room with all needs in reach.   Therapy Documentation Precautions:  Precautions Precautions: Fall Precaution Comments: Dense R hemi Restrictions Weight Bearing Restrictions: No   Vital Signs: Therapy Vitals Temp: 98.2 F (36.8 C) Temp src: Oral Pulse Rate: 74 Resp: 18 BP: 124/84 mmHg Patient Position (if appropriate): Sitting Oxygen Therapy SpO2: 100 % O2 Device: None (Room air) Pain: Pain Assessment Pain Assessment: No/denies pain  See FIM for current functional status  Therapy/Group: Individual Therapy  Vista Deckarcell, Jakaree Pickard Ann 01/10/2014, 6:42 PM

## 2014-01-10 NOTE — Progress Notes (Signed)
Physical Therapy Weekly Progress Note  Patient Details  Name: [name] MRN: [id number] Date of Birth: August 01, 1955  Beginning of progress report period: January 03, 2014 End of progress report period: January 10, 2014  Today's Date: 01/10/2014 PT Individual Time: 4166-0630 PT Individual Time Calculation (min): 60 min   Patient has met 2 of 4 short term goals.  Pt continues to make steady progress with all aspects of mobility, however continues to have decreased functional movement in RLE, esp in hamstrings.  This causes pt to have vaulting during gait and decreased balance, as he does not always adequately clear RLE during swing phase of gait.  Have transitioned to [facility] as this would be safer for gait at home vs hemi walker.  Required min A for up to 50' yesterday with R AFO.  Will attempt Bioness for increased hamstring activation and will let [facility] know that he will need AFO prior to D/C.   Patient continues to demonstrate the following deficits: decreased balance, decreased functional use of RUE/LE, decreased safety awareness and therefore will continue to benefit from skilled PT intervention to enhance overall performance with activity tolerance, balance, postural control, ability to compensate for deficits, functional use of  right upper extremity and right lower extremity, awareness and coordination.  Patient progressing toward long term goals..  Continue plan of care.  PT Short Term Goals Week 2:  PT Short Term Goal 1 (Week 2): Pt will perform bed mobility with mod I via log roll technique. PT Short Term Goal 1 - Progress (Week 2): Met PT Short Term Goal 2 (Week 2): Pt will transfer bed <> w/c with mod I consistently.   PT Short Term Goal 2 - Progress (Week 2): Progressing toward goal PT Short Term Goal 3 (Week 2): Pt will ambulate 25 ft using LRAD and min A consistently.  PT Short Term Goal 3 - Progress (Week 2): Met PT Short Term Goal 4 (Week 2): Pt will negotiate 3 stairs using  1 rail and min A. PT Short Term Goal 4 - Progress (Week 2): Progressing toward goal Week 3:  PT Short Term Goal 1 (Week 3): =LTG's due to ELOS  Skilled Therapeutic Interventions/Progress Updates:   Pt received sitting in w/c in room, agreeable to therapy.  Skilled session focused on gait training with use of Bioness system with [facility].  See Bioness for details on settings.  Utilized calf piece for increased DF assist during swing phase of gait and also utilized thigh set up for increased hamstring activation during swing phase of gait.  Note that hamstring contraction was slightly better than ankle DF.  Did note slightly decreased vaulting and improved foot clearance during swing phase of gait. Continue to provide manual facilitation at L hip to decrease vaulting and assist at L heel to maintain contact with floor during R swing phase of gait.  Ended session with standing NMR for hamstring activation while R foot on soccer ball rolling forwards and backwards with assist from recreational therapist for support on ball.  Pt tolerated well and continues to be highly motivated.  Discussed having rail put up at home and also having wife come in starting this weekend and beginning of next week for hands on training.  Also discussed possible outing for next week for community integration with RT.  Pt verbalized understanding.  Pt left in room with all needs in reach.   Therapy Documentation Precautions:  Precautions Precautions: Fall Precaution Comments: Dense R hemi Restrictions Weight Bearing Restrictions:  No   Vital Signs: Therapy Vitals Temp: 98.5 F (36.9 C) Temp src: Oral Pulse Rate: 72 Resp: 18 BP: 129/77 mmHg Patient Position (if appropriate): Lying Oxygen Therapy SpO2: 99 % O2 Device: None (Room air) Pain: Pt with no c/o pain during session.      See FIM for current functional status  Therapy/Group: Individual Therapy  Denice Bors 01/10/2014, 8:22 AM

## 2014-01-10 NOTE — Progress Notes (Signed)
Recreational Therapy Session Note  Patient Details  Name: Nelva Naynthony Shan MRN: [id number] Date of Birth: 07/04/1955 Today's Date: 01/10/2014  Pain: no c/o Skilled Therapeutic Interventions/Progress Updates: Session focused on dynamic balance activities during cotreat with PT.  Discussion with pt about purpose of community reintegration, pt agreeable to participate.  Therapy/Group: Co-Treatment  Crystallee Werden 01/10/2014, 1:45 PM

## 2014-01-10 NOTE — Progress Notes (Signed)
Occupational Therapy Weekly Progress Note  Patient Details  Name: [name] MRN: [id number] Date of Birth: 10/23/1955  Beginning of progress report period: January 05, 2014 End of progress report period: January 10, 2014  Today's Date: 01/10/2014 OT Individual Time:0957-1057 and 1540-1610 OT Individual Time Calculation (min): 60 min and 30 min   Patient has met 3 of 4 short term goals. Remaining goal regarding patients ability to donn R shoe and AFO pt is making progress towards. Pt is unable to cross leg over without assistance. Once R leg is crossed over L knee he is able to donn shoe with AFO.     Patient continues to demonstrate the following deficits: decreased I in self care, R UE strength, R UE AROM, dynamic standing balance, and functional mobility/transfers and therefore will continue to benefit from skilled OT intervention to enhance overall performance with BADL.  Patient progressing toward long term goals..  Continue plan of care.  OT Short Term Goals Week 2:  OT Short Term Goal 1 (Week 2): Pt will perform LB dressing with Min A seated on edge of bed in order to increase I with self care. OT Short Term Goal 1 - Progress (Week 2): Met OT Short Term Goal 2 (Week 2): Pt will donn bath mitt onto R hand to assist with bathing independently in order to increase I in self care. OT Short Term Goal 2 - Progress (Week 2): Met OT Short Term Goal 3 (Week 2): Pt will donn R shoe with AFO with increased time in order to increased I with dressing task. OT Short Term Goal 3 - Progress (Week 2): Partly met OT Short Term Goal 4 (Week 2): Pt will stand at sink with supervision from grooming with R UE as stabilizer in order to perform grooming tasks and increase standing balance.  OT Short Term Goal 4 - Progress (Week 2): Met Week 3:  OT Short Term Goal 1 (Week 3): short term goals = LTGs secondary to remaining LOS  Skilled Therapeutic Interventions/Progress Updates:  Session 1 (8185-6314):  Upon entering the room, pt seated in wheelchair awaiting therapist arrival. Pt with no c/o pain during session. Pt propelled wheelchair to ADL apartment. Pt ambulated into bathroom with use of quad cane and Min A. Pt transferred onto tub transfer bench inside of shower/tub combination with Min A. Pt was able to get R LE into and out of tub this session with assist from L UE. Pt donned bath mitt independently this session. Pt performed bathing with Min A. OT discussed pt needing 1 non slip bath mat in order to dry feet when exiting tub, recommended purchase of safety treads for inside of tub, and description of how to utilize shower curtain with bench in order to keep water off the floor. Pt verbalized understanding. Pt performed dressing seated on EOB. Grooming performed at sink side once back inside of the room with R UE utilized as a stabilizer and for weight bearing. Pt seated in wheelchair with call bell and all needed items within reach upon exiting the room.   Session 2 (1540-1610): Pt with no c/o pain this session. Pt did report feelings of "tightness" in R UE. Therapist performed PROM to R UE for shoulder,elbow,wrist, and digits in all planes x 5 reps in order to stretch. Pt performed shoulder flexion table slides x 10 reps. Pt is unable to extend arm and elbow to push arm into external rotation or shoulder flexion without assistance. Pt is able to pull  arm back towards body and also internal rotation. R UE exercises very tiring to pt this session. Pt then propelled self back to room with use of L UE and LLE.   Therapy Documentation Precautions:  Precautions Precautions: Fall Precaution Comments: Dense R hemi Restrictions Weight Bearing Restrictions: No Pain: Pain Assessment Pain Score: 0-No pain  See FIM for current functional status  Therapy/Group: Individual Therapy  [name] 01/10/2014, 11:24 AM

## 2014-01-11 ENCOUNTER — Inpatient Hospital Stay (HOSPITAL_COMMUNITY): Payer: BC Managed Care – PPO | Admitting: Physical Therapy

## 2014-01-11 ENCOUNTER — Inpatient Hospital Stay (HOSPITAL_COMMUNITY): Payer: BC Managed Care – PPO | Admitting: Occupational Therapy

## 2014-01-11 ENCOUNTER — Inpatient Hospital Stay (HOSPITAL_COMMUNITY): Payer: BC Managed Care – PPO | Admitting: *Deleted

## 2014-01-11 NOTE — Progress Notes (Signed)
58 year old RH-male with history of HTN (no meds > 1 year) who was admitted to [facility] on 08/03/015 with two day history of fluctuating RUE weakness progressive to difficulty speaking and some swallowing issues. He reported being under a lot of stress, working 16 days straight and family forced him to seek medical assistance. UDS negative. TSH- 2.54. Hgb A1c- 601. CT head with recent left lenticulostriate infarct  Subjective/Complaints: Trying AFO in therapy, not fitting well.   No new issues, minimal R shoulder pain when he lays on that side Review of Systems - Negative except R side weak Objective: Vital Signs: Blood pressure 120/55, pulse 72, temperature 98.8 F (37.1 C), temperature source Oral, resp. rate 19, height 5\' 11"  (1.803 m), weight 98.6 kg (217 lb 6 oz), SpO2 99.00%. No results found. No results found for this or any previous visit (from the past 72 hour(s)).   HEENT: normal Cardio: RRR Resp: CTA B/L and unlabored GI: BS positive and NT,ND Extremity:  Pulses positive and No Edema Skin:   Intact Neuro: Alert/Oriented, Normal Sensory and Abnormal Motor trace biceps otherwise 0/5 in RUEexcept 2- R triceps,2- R HF, 2- hip/knee ext synergy,2- Hip Add and abd,  0/5 in R foot/ankle Tone is reduce RUE  Musc/Skel:  Normal, no pain with R shoulder ROM,     Gen NAD   Assessment/Plan: 1. 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 Physiatrist is providing close team supervision and 24 hour management of active medical problems listed below. Physiatrist and rehab team continue to assess barriers to discharge/monitor patient progress toward functional and medical goals.  FIM: FIM - Bathing Bathing Steps Patient Completed: Chest;Right Arm;Abdomen;Front perineal area;Buttocks;Right upper leg;Left upper leg;Left lower leg (including foot) Bathing: 4:  Min-Patient completes 8-9 43f 10 parts or 75+ percent  FIM - Upper Body Dressing/Undressing Upper body dressing/undressing steps patient completed: Thread/unthread right sleeve of pullover shirt/dresss;Thread/unthread left sleeve of pullover shirt/dress;Put head through opening of pull over shirt/dress;Pull shirt over trunk Upper body dressing/undressing: 5: Supervision: Safety issues/verbal cues FIM - Lower Body Dressing/Undressing Lower body dressing/undressing steps patient completed: Thread/unthread left underwear leg;Thread/unthread left pants leg;Pull pants up/down;Pull underwear up/down;Don/Doff left sock;Don/Doff left shoe Lower body dressing/undressing: 3: Mod-Patient completed 50-74% of tasks  FIM - Toileting Toileting steps completed by patient: Performs perineal hygiene Toileting Assistive Devices: Grab bar or rail for support Toileting: 3: Mod-Patient completed 2 of 3 steps  FIM - Diplomatic Services operational officer Devices: Elevated toilet seat Toilet Transfers: 4-From toilet/BSC: Min A (steadying Pt. > 75%);4-To toilet/BSC: Min A (steadying Pt. > 75%)  FIM - Bed/Chair Transfer Bed/Chair Transfer Assistive Devices: Arm rests;Orthosis;Cane Bed/Chair Transfer: 4: Bed > Chair or W/C: Min A (steadying Pt. > 75%);4: Chair or W/C > Bed: Min A (steadying Pt. > 75%)  FIM - Locomotion: Wheelchair Distance: 150 Locomotion: Wheelchair: 6: Travels 150 ft or more, turns around, maneuvers to table, bed or toilet, negotiates 3% grade: maneuvers on rugs and over door sills independently FIM - Locomotion: Ambulation Locomotion: Ambulation Assistive Devices: Occupational hygienist Ambulation/Gait Assistance: 4: Min assist Locomotion: Ambulation: 2: Travels 50 - 149 ft with minimal assistance (Pt.>75%)  Comprehension Comprehension Mode: Auditory Comprehension: 6-Follows complex conversation/direction: With extra time/assistive device  Expression Expression Mode: Verbal Expression:  6-Expresses complex ideas: With extra time/assistive device  Social Interaction Social Interaction: 7-Interacts appropriately with others - No medications needed.  Problem Solving Problem  Solving: 6-Solves complex problems: With extra time  Memory Memory: 6-More than reasonable amt of time  Medical Problem List and Plan:  1. 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  2. DVT Prophylaxis/Anticoagulation: Pharmaceutical: Lovenox.  3. Pain Management: N/A , R shoulder pain intermittent , cont ROM, no injection needed at this time 4. Mood: Motivated to get better. Will have LCSW follow for evaluation and support.  5. Neuropsych: This patient is capable of making decisions on his own behalf.  6. Skin/Wound Care: Monitor skin with routine checks. Maintain adequate hydration and nutritional status.  7. HTN: Will monitor BP every 8 hours. Avoid hypotension to allow adequate perfusion.  8. Dyslipidemia: Continue zocor.    LOS (Days) 15 A FACE TO FACE EVALUATION WAS PERFORMED  Jabin Tapp E 01/11/2014, 6:26 AM

## 2014-01-11 NOTE — Progress Notes (Addendum)
Occupational Therapy Session Note  Patient Details  Name: [name] MRN: [id number] Date of Birth: 12/13/1955  Today's Date: 01/11/2014 OT Individual Time: 1430-1710 OT Individual Time Calculation (min): 40 min   Short Term Goals: Week 1:  OT Short Term Goal 1 (Week 1): Pt will perform grooming with set up A in order to increase I in self care OT Short Term Goal 1 - Progress (Week 1): Met OT Short Term Goal 2 (Week 1): Pt. will perform bathing with Min A at sinkside in order to increase I in self care. OT Short Term Goal 2 - Progress (Week 1): Met OT Short Term Goal 3 (Week 1): Pt will perform toilet transfer with Mod A in order to increase I in functional transfers. OT Short Term Goal 3 - Progress (Week 1): Met OT Short Term Goal 4 (Week 1): Pt will perform UB dressing with Min A utilizing hemiplegic technique in order to increase I with dressing.  OT Short Term Goal 4 - Progress (Week 1): Met OT Short Term Goal 5 (Week 1): Pt will perform LB dressing with Max A in order to increase I with self care. OT Short Term Goal 5 - Progress (Week 1): Met Week 2:  OT Short Term Goal 1 (Week 2): Pt will perform LB dressing with Min A seated on edge of bed in order to increase I with self care. OT Short Term Goal 1 - Progress (Week 2): Met OT Short Term Goal 2 (Week 2): Pt will donn bath mitt onto R hand to assist with bathing independently in order to increase I in self care. OT Short Term Goal 2 - Progress (Week 2): Met OT Short Term Goal 3 (Week 2): Pt will donn R shoe with AFO with increased time in order to increased I with dressing task. OT Short Term Goal 3 - Progress (Week 2): Partly met OT Short Term Goal 4 (Week 2): Pt will stand at sink with supervision from grooming with R UE as stabilizer in order to perform grooming tasks and increase standing balance.  OT Short Term Goal 4 - Progress (Week 2): Met  Skilled Therapeutic Interventions/Progress Updates:    Addressed NMRE RUE to aid  in self care.  Pt. Propelled wc from room to gym.  Transferred from wc to mat with SBA and set up.  Provided table for pt to rest RUE while lying on left side.  Performed scapular mobilization in all planes.  Did shoulder flexion and extension, scapular protraction, retraction, elbow extension,flexion, wrist flexion/extension, and finger extension.  Pt. Has tightness in glenohumeral joint  And acromion clavicular joint.  Provided joint mobs and myotherapy techniques.  Pt stated the session was helpful in that he needed to deep stretching in all planes.  Pt returned to room and all needs set up in place.    Therapy Documentation Precautions:  Precautions Precautions: Fall Precaution Comments: Dense R hemi Restrictions Weight Bearing Restrictions: No     Pain:  none      Exercises:  PNf, NMRE, Myotherapy     See FIM for current functional status  Therapy/Group: Individual Therapy  Lisa Roca 01/11/2014, 5:16 PM

## 2014-01-11 NOTE — Progress Notes (Signed)
Occupational Therapy Session Note  Patient Details  Name: [name] MRN: [id number] Date of Birth: [birth year]  Today's Date: 01/11/2014 OT Individual Time: [phone] and 1428-1500 OT Individual Time Calculation (min): 60 min and 32 min   Short Term Goals: Week 2:  OT Short Term Goal 1 (Week 2): Pt will perform LB dressing with Min A seated on edge of bed in order to increase I with self care. OT Short Term Goal 1 - Progress (Week 2): Met OT Short Term Goal 2 (Week 2): Pt will donn bath mitt onto R hand to assist with bathing independently in order to increase I in self care. OT Short Term Goal 2 - Progress (Week 2): Met OT Short Term Goal 3 (Week 2): Pt will donn R shoe with AFO with increased time in order to increased I with dressing task. OT Short Term Goal 3 - Progress (Week 2): Partly met OT Short Term Goal 4 (Week 2): Pt will stand at sink with supervision from grooming with R UE as stabilizer in order to perform grooming tasks and increase standing balance.  OT Short Term Goal 4 - Progress (Week 2): Met Week 3:  OT Short Term Goal 1 (Week 3): short term goals = LTGs secondary to remaining LOS  Skilled Therapeutic Interventions/Progress Updates:  Session [phone])- Upon entering room, pt supine in bed. Pt with no clothing on and reports, "I had an accident this morning." Pt with no c/o pain this session. OT session with focus on ADL training, use of R UE as gross motor assist, functional mobility, functional transfers, and dynamic standing balance. Pt ambulated into bathroom with use of quad cane, shoes on with R AFO, to sit on tub transfer bench with steady assist. Pt donned and doffed bath mitt for R UE independently. Pt utilized L UE to assist R UE in performing gross assist for bathing face, chest , abdomen, and B upper legs. Dressing performed seated on EOB. Pt demonstrating the ability to cross legs with assist from L UE in order to independently thread clothing onto feet.   Pt previously got clothing from dresser and sat out for task prior to session while seated in wheelchair. Pt sat in wheelchair at sink side and performed grooming and even shaving his own face with supervision. Pt able to open containers with L hand and placement between legs. Pt making progress towards goals.  Session 2 (1428-1500) - Pt with no c/o pain during session. Pt propelled self in wheelchair to day room with use of L UE and LE. Pt standing with supervision. Items placed on far edge of table in all directions. Pt having to reach out of base of support for cards and return to standing position while R UE in weight bearing position for table top activity. Pt lost balance with Min A to correct self when reaching to the furthest right side on table x 2 reps. Pt standing for 15 minutes to complete tasks. Pt them demonstrating 10 reps each of self ROM exercises with use of paper handout and verbal cues for proper technique. Pt performing exercises for shoulder,elbow,wrist, and digits in all planes.   Therapy Documentation Precautions:  Precautions Precautions: Fall Precaution Comments: Dense R hemi Restrictions Weight Bearing Restrictions: No Pain: Pain Assessment Pain Assessment: No/denies pain  See FIM for current functional status  Therapy/Group: Individual Therapy  [name] 01/11/2014, 2:14 PM

## 2014-01-11 NOTE — Progress Notes (Signed)
Social Work Patient ID: [name], male   DOB: 07-15-1955, 58 y.o.   MRN: [id number] Met with pt and spoke with wife regarding family education tomorrow changed to 1;00-3;00.  Message left for wife. Assisting with completing disability form and trying to locate [employer] form.  Have given completed SSD form back to pt, he will give to his wife when here tonight.  Still researching PCP to be set up with.  Work on discharge next week.

## 2014-01-11 NOTE — Progress Notes (Signed)
Physical Therapy Session Note  Patient Details  Name: [name] MRN: [id number] Date of Birth: 04/09/1956  Today's Date: 01/11/2014 PT Individual Time: 1104-1206 PT Individual Time Calculation (min): 62 min   Short Term Goals: Week 3:  PT Short Term Goal 1 (Week 3): =LTG's due to ELOS  Skilled Therapeutic Interventions/Progress Updates:   Pt received sitting in w/c, agreeable to therapy. W/c mobility mod I room <> gym via L hemi technique. Pt performed block squat pivot transfers w/c <> mat raised to patient's approx bed height, supervision with cues for pacing and technique. Remainder of session focused on decreasing compensatory strategies for gait with emphasis on R passive knee flexion in terminal stance and decreased contralateral vaulting for R foot clearance. Pt demo good carryover during gait up to 50 ft using LBQC and min guard-min A with shoe cover with cues for sequencing step-through pattern vs step-to pattern for improved R knee flexion. Pt performed supine <> sit with supervision, no cues for technique. Orthotist arriving at end of session. Trialled [employer] AFO with .25 inch heel lift and patient demo improved R toe clearance and improved efficiency with R swing phase. Prosthetist to add leather shoe cap to tennis shoe for ease of RLE advancement and decreased compensation during gait. Patient educated on wearing schedule and care of AFO. Pt left sitting in w/c with all needs within reach.   Therapy Documentation Precautions:  Precautions Precautions: Fall Precaution Comments: Dense R hemi Restrictions Weight Bearing Restrictions: No Pain: Pain Assessment Pain Assessment: No/denies pain Pain Score: 0-No pain  See FIM for current functional status  Therapy/Group: Individual Therapy  [name], [name] 01/11/2014, 12:20 PM

## 2014-01-12 ENCOUNTER — Inpatient Hospital Stay (HOSPITAL_COMMUNITY): Payer: BC Managed Care – PPO | Admitting: Occupational Therapy

## 2014-01-12 ENCOUNTER — Ambulatory Visit (HOSPITAL_COMMUNITY): Payer: BC Managed Care – PPO | Admitting: *Deleted

## 2014-01-12 NOTE — Progress Notes (Signed)
Occupational Therapy Session Note  Patient Details  Name: [name] MRN: [id number] Date of Birth: [birth year]  Today's Date: 01/12/2014 OT Individual Time: 1400-1500 OT Individual Time Calculation (min): 60 min   Short Term Goals: Week 3:  OT Short Term Goal 1 (Week 3): short term goals = LTGs secondary to remaining LOS  Skilled Therapeutic Interventions/Progress Updates:  Pt with no c/o pain this session. Patients wife and sister, his caregiver, present for family education/training this session. Pt ambulated into ADL apartment bathroom with Min A from wife, as previously educated. OT demonstrated tub transfer bench for family members and educated them on purpose on DME as well as how to set up within home environment. Caregivers verbalized understanding. Pt performed tub transfer onto DME with Min A from caregiver. Pt performed bathing with A to wash L UE from wife. Pt donned and doffed bath mitt onto R hand for washing. OT discussed the purpose of bath mitt with caregiver. Pt sat edge of tub bench for dressing with Min A for LB dressing to donn R shoe and AFO brace. OT discussed the importance of patient directed care for transfers and ambulation in order to decrease fall risk. Pt stood at sinkside with hand in weight bearing position while therapist educated caregiver on position and purpose as well as functional ways to engage in task. Caregivers reporting no additional concerns or questions this session. Pt seated in wheelchair with call bell and all needed items within reach upon entering the room.   Therapy Documentation Precautions:  Precautions Precautions: Fall Precaution Comments: Dense R hemi Restrictions Weight Bearing Restrictions: No  See FIM for current functional status  Therapy/Group: Individual Therapy  Lowella Grip 01/12/2014, 4:36 PM

## 2014-01-12 NOTE — Progress Notes (Signed)
Physical Therapy Session Note  Patient Details  Name: [name] MRN: [id number] Date of Birth: 07/25/1955  Today's Date: 01/12/2014 PT Individual Time: 13:03-13:48 (45min)    Skilled Therapeutic Interventions/Progress Updates:  Tx focused on family training with wife. New AFO, heel wedge and toe cap are on and feeling good.  Educated pt/wife on basics of fall risk prevention, guarding techniques and positioning, and home safety, and they did a nice job of return-demonstrating basics.  Instructed wife/pt in basic transfers, car transfer, gait training, bed mobility, and home simulated stairs with 1 "rail/pole" on L ascending and quad cane descending. They were able to return demon techniques with cues and reminders for positioning more closely. Pt had difficulty with descending stairs with quad cane, needing up to mod A to prevent fall with therapist. Pt will continue to attempt reaching friend to build rail for safety.   Gait training 2x75' with AFO and WBQC and min-guard A overall, Min A needed for turns on carpet in apartment.  Pt/wife feeling good about progress and remaining work for this week. No further questions at this time.      Therapy Documentation Precautions:  Precautions Precautions: Fall Precaution Comments: Dense R hemi Restrictions Weight Bearing Restrictions: No  Pain: none    See FIM for current functional status  Therapy/Group: Individual Therapy Clydene Lamingole Jenet Durio, PT, DPT  01/12/2014, 1:40 PM

## 2014-01-12 NOTE — Progress Notes (Signed)
58 year old RH-male with history of HTN (no meds > 1 year) who was admitted to [facility] on 08/03/015 with two day history of fluctuating RUE weakness progressive to difficulty speaking and some swallowing issues. He reported being under a lot of stress, working 16 days straight and family forced him to seek medical assistance. UDS negative. TSH- 2.54. Hgb A1c- 601. CT head with recent left lenticulostriate infarct  Subjective/Complaints: Asking about FMLA paperwork for wife Review of Systems - Negative except R side weak Objective: Vital Signs: Blood pressure 149/87, pulse 74, temperature 98.6 F (37 C), temperature source Oral, resp. rate 20, height 5\' 11"  (1.803 m), weight 98.6 kg (217 lb 6 oz), SpO2 99.00%. No results found. No results found for this or any previous visit (from the past 72 hour(s)).   HEENT: normal Cardio: RRR Resp: CTA B/L and unlabored GI: BS positive and NT,ND Extremity:  Pulses positive and No Edema Skin:   Intact Neuro: Alert/Oriented, Normal Sensory and Abnormal Motor trace biceps otherwise 0/5 in RUEexcept 2- R triceps,3- R HF, 2- hip/knee ext synergy,2- Hip Add and abd,  0/5 in R foot/ankle Tone is reduce RUE  Musc/Skel:  Normal, no pain with R shoulder ROM,     Gen NAD   Assessment/Plan: 1. 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 Physiatrist is providing close team supervision and 24 hour management of active medical problems listed below. Physiatrist and rehab team continue to assess barriers to discharge/monitor patient progress toward functional and medical goals.  FIM: FIM - Bathing Bathing Steps Patient Completed: Chest;Right Arm;Abdomen;Front perineal area;Buttocks;Right upper leg;Left upper leg;Right lower leg (including foot);Left lower leg (including foot) Bathing: 4: Min-Patient completes 8-9 26f 10 parts or 75+  percent  FIM - Upper Body Dressing/Undressing Upper body dressing/undressing steps patient completed: Thread/unthread right sleeve of pullover shirt/dresss;Thread/unthread left sleeve of pullover shirt/dress;Put head through opening of pull over shirt/dress;Pull shirt over trunk Upper body dressing/undressing: 5: Supervision: Safety issues/verbal cues FIM - Lower Body Dressing/Undressing Lower body dressing/undressing steps patient completed: Thread/unthread left underwear leg;Thread/unthread right underwear leg;Pull underwear up/down;Pull pants up/down;Thread/unthread right pants leg;Thread/unthread left pants leg;Don/Doff left sock;Don/Doff left shoe Lower body dressing/undressing: 4: Min-Patient completed 75 plus % of tasks  FIM - Toileting Toileting steps completed by patient: Performs perineal hygiene Toileting Assistive Devices: Grab bar or rail for support Toileting: 3: Mod-Patient completed 2 of 3 steps  FIM - Diplomatic Services operational officer Devices: Elevated toilet seat Toilet Transfers: 4-From toilet/BSC: Min A (steadying Pt. > 75%);4-To toilet/BSC: Min A (steadying Pt. > 75%)  FIM - Bed/Chair Transfer Bed/Chair Transfer Assistive Devices: Arm rests Bed/Chair Transfer: 5: Chair or W/C > Bed: Supervision (verbal cues/safety issues);4: Bed > Chair or W/C: Min A (steadying Pt. > 75%);5: Sit > Supine: Supervision (verbal cues/safety issues);5: Supine > Sit: Supervision (verbal cues/safety issues)  FIM - Locomotion: Wheelchair Distance: 150 Locomotion: Wheelchair: 6: Travels 150 ft or more, turns around, maneuvers to table, bed or toilet, negotiates 3% grade: maneuvers on rugs and over door sills independently FIM - Locomotion: Ambulation Locomotion: Ambulation Assistive Devices: Occupational hygienist Ambulation/Gait Assistance: 4: Min guard;4: Min assist Locomotion: Ambulation: 2: Travels 50 - 149 ft with minimal assistance (Pt.>75%)  Comprehension Comprehension Mode:  Auditory Comprehension: 6-Follows complex conversation/direction: With extra time/assistive device  Expression Expression Mode: Verbal Expression: 6-Expresses complex ideas: With extra time/assistive device  Social Interaction Social Interaction: 7-Interacts appropriately  with others - No medications needed.  Problem Solving Problem Solving: 6-Solves complex problems: With extra time  Memory Memory: 6-More than reasonable amt of time  Medical Problem List and Plan:  1. 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  2. DVT Prophylaxis/Anticoagulation: Pharmaceutical: Lovenox.  3. Pain Management: N/A , R shoulder pain intermittent , cont ROM, no injection needed at this time 4. Mood: Motivated to get better. Will have LCSW follow for evaluation and support.  5. Neuropsych: This patient is capable of making decisions on his own behalf.  6. Skin/Wound Care: Monitor skin with routine checks. Maintain adequate hydration and nutritional status.  7. HTN: Will monitor BP every 8 hours. Avoid hypotension to allow adequate perfusion.  8. Dyslipidemia: Continue zocor.    LOS (Days) 16 A FACE TO FACE EVALUATION WAS PERFORMED  Robena Ewy E 01/12/2014, 7:03 AM

## 2014-01-13 ENCOUNTER — Ambulatory Visit (HOSPITAL_COMMUNITY): Payer: BC Managed Care – PPO

## 2014-01-13 NOTE — Progress Notes (Signed)
58 year old RH-male with history of HTN (no meds > 1 year) who was admitted to [facility] on 08/03/015 with two day history of fluctuating RUE weakness progressive to difficulty speaking and some swallowing issues. He reported being under a lot of stress, working 16 days straight and family forced him to seek medical assistance. UDS negative. TSH- 2.54. Hgb A1c- 601. CT head with recent left lenticulostriate infarct  Subjective/Complaints: Received new AFO which is working better Wife and sister in for family training yesterday Review of Systems - Negative except R side weak Objective: Vital Signs: Blood pressure 139/79, pulse 63, temperature 98.3 F (36.8 C), temperature source Oral, resp. rate 18, height  (1.803 m), weight 98.6 kg (217 lb 6 oz), SpO2 99.00%. No results found. No results found for this or any previous visit (from the past 72 hour(s)).   HEENT: normal Cardio: RRR Resp: CTA B/L and unlabored GI: BS positive and NT,ND Extremity:  Pulses positive and No Edema Skin:   Intact Neuro: Alert/Oriented, Normal Sensory and Abnormal Motor trace biceps otherwise 0/5 in RUEexcept 2- R triceps,3- R HF, 2- hip/knee ext synergy,2- Hip Add and abd,  0/5 in R foot/ankle, increased tone R pecs Tone is reduce RUE  Musc/Skel:  Normal, no pain with R shoulder ROM,     Gen NAD   Assessment/Plan: 1. 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 Physiatrist is providing close team supervision and 24 hour management of active medical problems listed below. Physiatrist and rehab team continue to assess barriers to discharge/monitor patient progress toward functional and medical goals.  FIM: FIM - Bathing Bathing Steps Patient Completed: Chest;Right Arm;Abdomen;Front perineal area;Buttocks;Right upper leg;Left upper leg;Right lower leg (including foot);Left lower leg  (including foot) Bathing: 4: Min-Patient completes 8-9 28f 10 parts or 75+ percent  FIM - Upper Body Dressing/Undressing Upper body dressing/undressing steps patient completed: Thread/unthread right sleeve of pullover shirt/dresss;Thread/unthread left sleeve of pullover shirt/dress;Put head through opening of pull over shirt/dress;Pull shirt over trunk Upper body dressing/undressing: 5: Supervision: Safety issues/verbal cues FIM - Lower Body Dressing/Undressing Lower body dressing/undressing steps patient completed: Don/Doff left shoe;Don/Doff left sock;Don/Doff right sock;Pull pants up/down;Thread/unthread left pants leg;Pull underwear up/down;Thread/unthread right pants leg;Thread/unthread left underwear leg;Thread/unthread right underwear leg Lower body dressing/undressing: 4: Min-Patient completed 75 plus % of tasks  FIM - Toileting Toileting steps completed by patient: Performs perineal hygiene Toileting Assistive Devices: Grab bar or rail for support Toileting: 3: Mod-Patient completed 2 of 3 steps  FIM - Diplomatic Services operational officer Devices: Elevated toilet seat Toilet Transfers: 4-From toilet/BSC: Min A (steadying Pt. > 75%);4-To toilet/BSC: Min A (steadying Pt. > 75%)  FIM - Bed/Chair Transfer Bed/Chair Transfer Assistive Devices: Arm rests;Cane;Orthosis Bed/Chair Transfer: 5: Supine > Sit: Supervision (verbal cues/safety issues);4: Sit > Supine: Min A (steadying pt. > 75%/lift 1 leg);5: Bed > Chair or W/C: Supervision (verbal cues/safety issues);4: Chair or W/C > Bed: Min A (steadying Pt. > 75%)  FIM - Locomotion: Wheelchair Distance: 150 Locomotion: Wheelchair: 6: Travels 150 ft or more, turns around, maneuvers to table, bed or toilet, negotiates 3% grade: maneuvers on rugs and over door sills independently FIM - Locomotion: Ambulation Locomotion: Ambulation Assistive Devices: Occupational hygienist Ambulation/Gait Assistance: 4: Min guard;4: Min assist Locomotion:  Ambulation: 2: Travels 50 - 149 ft with minimal assistance (Pt.>75%)  Comprehension Comprehension Mode: Auditory Comprehension: 6-Follows complex conversation/direction: With extra time/assistive device  Expression Expression Mode: Verbal Expression: 6-Expresses complex ideas: With extra time/assistive device  Social Interaction Social Interaction: 6-Interacts appropriately with others with medication or extra time (anti-anxiety, antidepressant).  Problem Solving Problem Solving: 6-Solves complex problems: With extra time  Memory Memory: 6-More than reasonable amt of time  Medical Problem List and Plan:  1. 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  2. DVT Prophylaxis/Anticoagulation: Pharmaceutical: Lovenox.  3. Pain Management: N/A , R shoulder pain intermittent , cont ROM, no injection needed at this time 4. Mood: Motivated to get better. Will have LCSW follow for evaluation and support.  5. Neuropsych: This patient is capable of making decisions on his own behalf.  6. Skin/Wound Care: Monitor skin with routine checks. Maintain adequate hydration and nutritional status.  7. HTN: Will monitor BP every 8 hours. Avoid hypotension to allow adequate perfusion.  8. Dyslipidemia: Continue zocor.    LOS (Days) 17 A FACE TO FACE EVALUATION WAS PERFORMED  KIRSTEINS,ANDREW E 01/13/2014, 6:25 AM

## 2014-01-13 NOTE — Progress Notes (Signed)
Physical Therapy Session Note  Patient Details  Name: [name] MRN: [id number] Date of Birth: May 31, 1955  Today's Date: 01/13/2014 PT Individual Time: 1400-1450 PT Individual Time Calculation (min): 50 min   Short Term Goals: Week 3:  PT Short Term Goal 1 (Week 3): =LTG's due to ELOS  Skilled Therapeutic Interventions/Progress Updates:    Pt received seated in w/c, agreeable to participate in therapy. Session focused on gait training, R hamstring strengthening, R shoulder pain management. Pt propelled w/c w/ mod (I) throughout rehab unit. Ambulated w/ [facility] 100' w/ MinGuardA w/ step-to gait, gait characterized by contralateral vaulting on L, suspected 2/2 decreased hamstring strength in RLE preventing adequate foot clearance. Pt ambulated 50' w/ [facility] w/ one LOB while therapist providing manual facilitation to R hamstrings during pre-swing phase, required +2 A to correct LOB due to positioning of therapist. Pt w/ 2x10 HS curls in supine w/ R foot on yellow theraball. 2x10 HS curls in prone, then 2x10 HS curls in sidelying on powder board. In between sets of hamstring exercises, therapist focused on stretching R shoulder into external rotation w/ prone positioning to provide stretch w/o pain. Pt ambulated 50' w/ [facility] w/ MinA, continued to note contralateral vaulting that improved slightly with facilitation of pelvic rotation forward on R during swing phase.Marland Kitchen Pt propelled w/c to room, left seated in w/c w/ all needs within reach.  Therapy Documentation Precautions:  Precautions Precautions: Fall Precaution Comments: Dense R hemi Restrictions Weight Bearing Restrictions: No Vital Signs: Therapy Vitals Temp: 98.7 F (37.1 C) Temp src: Oral Pulse Rate: 78 Resp: 18 BP: 134/87 mmHg Patient Position (if appropriate): Sitting Oxygen Therapy SpO2: 100 % O2 Device: None (Room air) Pain: Pain Assessment Pain Assessment: No/denies pain Pain Score: 0-No pain Locomotion  : Ambulation Ambulation/Gait Assistance: 4: Min guard;4: Min assist   See FIM for current functional status  Therapy/Group: Individual Therapy  Hosie Spangle Hosie Spangle, PT, DPT 01/13/2014, 4:26 PM

## 2014-01-14 ENCOUNTER — Encounter (HOSPITAL_COMMUNITY): Payer: BC Managed Care – PPO | Admitting: Occupational Therapy

## 2014-01-14 ENCOUNTER — Inpatient Hospital Stay (HOSPITAL_COMMUNITY): Payer: BC Managed Care – PPO | Admitting: Rehabilitation

## 2014-01-14 ENCOUNTER — Encounter (HOSPITAL_COMMUNITY): Payer: BC Managed Care – PPO | Admitting: Rehabilitation

## 2014-01-14 DIAGNOSIS — I6330 Cerebral infarction due to thrombosis of unspecified cerebral artery: Secondary | ICD-10-CM

## 2014-01-14 DIAGNOSIS — I10 Essential (primary) hypertension: Secondary | ICD-10-CM

## 2014-01-14 DIAGNOSIS — G8110 Spastic hemiplegia affecting unspecified side: Secondary | ICD-10-CM

## 2014-01-14 NOTE — Progress Notes (Addendum)
Physical Therapy Session Note  Patient Details  Name: [name] MRN: [id number] Date of Birth: 1956/02/08  Today's Date: 01/14/2014 PT Individual Time: 0830-0915 PT Individual Time Calculation (min): 45 min   Short Term Goals: Week 3:  PT Short Term Goal 1 (Week 3): =LTG's due to ELOS  Skilled Therapeutic Interventions/Progress Updates:   Pt received sitting in w/c in room, agreeable to therapy this morning.  Self propelled at Mod I level to/from therapy gym.  Skilled session focused on NMR for RLE with seated nustep, focus on hamstring activation during gait training, and floor transfer.  Performed seated nustep x 8 mins at level 4 resistance.  Focused on increased hip add on RLE to keep leg in alignment.  Tolerated well, however did require light assist near end of time for hip add.  Transitioned to gait training with pt personal AFO w/ toe cap and LBQC x 50' at min A level.  Continue to provide facilitation for decreased vaulting at L hip and at L heel.  Note it continues to improve, but he does still vault.  Also added .5" shoe lift to L foot to increase clearance of RLE to decrease vaulting.  Note marked improvement, but fear that this will continue to wear on hip over time.   Will continue to assess.  Performed another 62' x 1 and 80' x 1 during session.  Ended with floor recovery with education on when to attempt vs when not to attempt.  Pt able to get into floor with min A, however requires +2 assist when getting up from floor due to pt compensating with over powering R glute, causing RLE to shoot out from under him each time.  Discussed that he should call 911 for safety in case of fall at home.  Pt verbalized understanding.  Pt propelled back to room on his own.    Therapy Documentation Precautions:  Precautions Precautions: Fall Precaution Comments: Dense R hemi Restrictions Weight Bearing Restrictions: No   Vital Signs:   Pain: Pt with no c/o pain during this session.   See  FIM for current functional status  Therapy/Group: Individual Therapy  Vista Deck 01/14/2014, 10:18 AM

## 2014-01-14 NOTE — Progress Notes (Signed)
Social Work Patient ID: [name], male   DOB: [birth year], 58 y.o.   MRN: [id number] Team feels pt would benefit form one more day for therapies and finishing family education.  Pt is agreeable and left message for wife.  Checking with MD regarding his input. Insurance has approved until Thursday.

## 2014-01-14 NOTE — Progress Notes (Signed)
58 year old RH-male with history of HTN (no meds > 1 year) who was admitted to [facility] on 08/03/015 with two day history of fluctuating RUE weakness progressive to difficulty speaking and some swallowing issues. He reported being under a lot of stress, working 16 days straight and family forced him to seek medical assistance. UDS negative. TSH- 2.54. Hgb A1c- 601. CT head with recent left lenticulostriate infarct  Subjective/Complaints: Some soreness R arm and shoulder, slept very well Review of Systems - Negative except R side weak Objective: Vital Signs: Blood pressure 139/86, pulse 66, temperature 98.3 F (36.8 C), temperature source Oral, resp. rate 18, height  (1.803 m), weight 98.6 kg (217 lb 6 oz), SpO2 100.00%. No results found. No results found for this or any previous visit (from the past 72 hour(s)).   HEENT: normal Cardio: RRR Resp: CTA B/L and unlabored GI: BS positive and NT,ND Extremity:  Pulses positive and No Edema Skin:   Intact Neuro: Alert/Oriented, Normal Sensory and Abnormal Motor trace biceps otherwise 0/5 in RUEexcept 2- R triceps,3- R HF, 2- hip/knee ext synergy,2- Hip Add and abd,  0/5 in R foot/ankle, increased tone R pecs, MAS 1 in FF Tone is reduce RUE  Musc/Skel:  Normal, external rotation  pain with R shoulder ROM,     Gen NAD   Assessment/Plan: 1. 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 Physiatrist is providing close team supervision and 24 hour management of active medical problems listed below. Physiatrist and rehab team continue to assess barriers to discharge/monitor patient progress toward functional and medical goals.  FIM: FIM - Bathing Bathing Steps Patient Completed: Chest;Right Arm;Abdomen;Front perineal area;Buttocks;Right upper leg;Left upper leg;Right lower leg (including foot) Bathing: 5: Supervision:  Safety issues/verbal cues  FIM - Upper Body Dressing/Undressing Upper body dressing/undressing steps patient completed: Thread/unthread right sleeve of pullover shirt/dresss;Thread/unthread left sleeve of pullover shirt/dress;Put head through opening of pull over shirt/dress;Pull shirt over trunk Upper body dressing/undressing: 5: Supervision: Safety issues/verbal cues FIM - Lower Body Dressing/Undressing Lower body dressing/undressing steps patient completed: Thread/unthread left underwear leg;Pull underwear up/down;Thread/unthread left pants leg;Pull pants up/down;Don/Doff left sock Lower body dressing/undressing: 3: Mod-Patient completed 50-74% of tasks  FIM - Toileting Toileting steps completed by patient: Performs perineal hygiene Toileting Assistive Devices: Grab bar or rail for support Toileting: 3: Mod-Patient completed 2 of 3 steps  FIM - Diplomatic Services operational officer Devices: Elevated toilet seat Toilet Transfers: 4-From toilet/BSC: Min A (steadying Pt. > 75%);4-To toilet/BSC: Min A (steadying Pt. > 75%)  FIM - Bed/Chair Transfer Bed/Chair Transfer Assistive Devices: Arm rests;Cane;Orthosis (AFO) Bed/Chair Transfer: 5: Supine > Sit: Supervision (verbal cues/safety issues);5: Sit > Supine: Supervision (verbal cues/safety issues);4: Bed > Chair or W/C: Min A (steadying Pt. > 75%);4: Chair or W/C > Bed: Min A (steadying Pt. > 75%)  FIM - Locomotion: Wheelchair Distance: 150 Locomotion: Wheelchair: 6: Travels 150 ft or more, turns around, maneuvers to table, bed or toilet, negotiates 3% grade: maneuvers on rugs and over door sills independently FIM - Locomotion: Ambulation Locomotion: Ambulation Assistive Devices: Occupational hygienist Ambulation/Gait Assistance: 4: Min guard;4: Min assist Locomotion: Ambulation: 2: Travels 50 - 149 ft with minimal assistance (Pt.>75%)  Comprehension Comprehension Mode: Auditory Comprehension: 6-Follows complex conversation/direction: With  extra time/assistive device  Expression Expression Mode: Verbal Expression: 6-Expresses complex ideas: With extra time/assistive device  Social Interaction Social Interaction: 6-Interacts appropriately with others with  medication or extra time (anti-anxiety, antidepressant).  Problem Solving Problem Solving: 6-Solves complex problems: With extra time  Memory Memory: 6-More than reasonable amt of time  Medical Problem List and Plan:  1. 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  2. DVT Prophylaxis/Anticoagulation: Pharmaceutical: Lovenox.  3. Pain Management: N/A , R shoulder pain intermittent , cont ROM, no injection needed at this time 4. Mood: Motivated to get better. Will have LCSW follow for evaluation and support.  5. Neuropsych: This patient is capable of making decisions on his own behalf.  6. Skin/Wound Care: Monitor skin with routine checks. Maintain adequate hydration and nutritional status.  7. HTN: Will monitor BP every 8 hours. Avoid hypotension to allow adequate perfusion.  8. Dyslipidemia: Continue zocor.    LOS (Days) 18 A FACE TO FACE EVALUATION WAS PERFORMED  Deena Shaub E 01/14/2014, 6:43 AM

## 2014-01-14 NOTE — Progress Notes (Signed)
Occupational Therapy Session Note  Patient Details  Name: [name] MRN: [id number] Date of Birth: December 01, 1955  Today's Date: 01/14/2014 OT Individual Time: 1301-1346 OT Individual Time Calculation (min): 45 min   Short Term Goals: Week 3:  OT Short Term Goal 1 (Week 3): short term goals = LTGs secondary to remaining LOS  Skilled Therapeutic Interventions/Progress Updates:    Engaged in ADL retraining with focus on functional transfers, dynamic standing balance, and education with caregiver - sister, [name]. Engaged in bathing at sit > stand level in ADL tub room with setup assist and use of bath mitt. Pt ambulated into bathroom with quadcane with min assist, educating sister on proper positioning of her body to increase safety and independence. Pt able to complete tub bench transfer without assist this session. Educated on lateral leans vs sit >stand to wash buttocks, with pt preference to complete lateral leans. Able to don socks and shoes this session by crossing leg over opposite knee, with steady assist from sister to maintain RLE crossed. Pt with carryover of proper placement of feet to increase safety with sit > stand and standing balance.  Sister present and providing assist as needed and pt able to direct his care.  Pt and sister with no further questions.  Therapy Documentation Precautions:  Precautions Precautions: Fall Precaution Comments: Dense R hemi Restrictions Weight Bearing Restrictions: No Pain:  Pt with no c/o pain  See FIM for current functional status  Therapy/Group: Individual Therapy  Rosalio Loud 01/14/2014, 2:31 PM

## 2014-01-14 NOTE — Progress Notes (Signed)
Recreational Therapy Session Note  Patient Details  Name: Zaevion Parke MRN: [id number] Date of Birth: 08/05/1955 Today's Date: 01/14/2014  Pain: no c/o Skilled Therapeutic Interventions/Progress Updates: Session focused on community reintegration w/c & ambulatory level. Goals included w/c mobility, ambulation with [facility], identification & negotiation of obstacles, education on energy conservation, & accessing public restroom.  See outing goal sheet in shadow charg for full details.  Therapy/Group: [employer]   Ethel Veronica 01/14/2014, 3:35 PM

## 2014-01-14 NOTE — Progress Notes (Signed)
Physical Therapy Session Note  Patient Details  Name: Nezar Buckles MRN: [id number] Date of Birth: [birth year]  Today's Date: 01/14/2014 PT Concurrent Time: 1030-1223 PT Concurrent Time Calculation (min): 113 min  Short Term Goals: Week 3:  PT Short Term Goal 1 (Week 3): =LTG's due to ELOS  Skilled Therapeutic Interventions/Progress Updates:   Pt received sitting in w/c in room, agreeable to outing.  Outing focused on w/c mobility, gait, negotiating in restaurant in w/c and with [facility], problem solving strategies and education on safe negotiation in public places and things to be aware of in community.  See paper community eval in chart for further details.     Therapy Documentation Precautions:  Precautions Precautions: Fall Precaution Comments: Dense R hemi Restrictions Weight Bearing Restrictions: No   Pain: pt with no pain during outing.    Locomotion : Ambulation Ambulation/Gait Assistance: 4: Min guard;4: Min Lawyer Distance: 150   See FIM for current functional status  Therapy/Group: Co-Treatment w/ RT  Vista Deck 01/14/2014, 12:44 PM

## 2014-01-15 ENCOUNTER — Inpatient Hospital Stay (HOSPITAL_COMMUNITY): Payer: BC Managed Care – PPO | Admitting: Physical Therapy

## 2014-01-15 ENCOUNTER — Encounter (HOSPITAL_COMMUNITY): Payer: BC Managed Care – PPO

## 2014-01-15 ENCOUNTER — Inpatient Hospital Stay (HOSPITAL_COMMUNITY): Payer: BC Managed Care – PPO

## 2014-01-15 DIAGNOSIS — I6330 Cerebral infarction due to thrombosis of unspecified cerebral artery: Secondary | ICD-10-CM

## 2014-01-15 DIAGNOSIS — I10 Essential (primary) hypertension: Secondary | ICD-10-CM

## 2014-01-15 DIAGNOSIS — G8110 Spastic hemiplegia affecting unspecified side: Secondary | ICD-10-CM

## 2014-01-15 NOTE — Progress Notes (Signed)
Social Work Patient ID: [name], male   DOB: 1955-06-27, 58 y.o.   MRN: [id number] Spoke with wife and MD has is in agreement with lengthening dsicharge until Thursday.  All are on board.  Will work toward discharge Thurs pt to go to [facility] for OP therapies.

## 2014-01-15 NOTE — Progress Notes (Signed)
Physical Therapy Session Note  Patient Details  Name: [name] MRN: [id number] Date of Birth: 03-May-1956  Today's Date: 01/15/2014 PT Individual Time: 1457-1600 PT Individual Time Calculation (min): 63 min   Short Term Goals: Week 3:  PT Short Term Goal 1 (Week 3): =LTG's due to ELOS  Skilled Therapeutic Interventions/Progress Updates:   Pt received seated in w/c, agreeable to therapy. W/c mobility 2 x 150 ft, mod I with L hemi technique. Squat pivot transfer x 2 w/c <> mat to L and R with cues for maintaining forward lean, supervision. Pt transferred sit <> supine, mod I. In sidelying, RLE NMR with use of powder board, active assisted progressed to active ROM with focus on isolating hip flexion/extension and knee extension (unable to palpate hamstring contraction). Pt reports that his neighbor will be able to install B rails for both home entrances before discharge planned for Thursday. Pt negotiated up/down 3 stairs using R rail ascending/R rail descending, supervision to min guard and improved RLE positioning when descending resulting in increased balance and safer BOS. Pt negotiated up/down curb step using [facility], min guard-min assist. Remainder of session focused on gait training using [facility] with focus on step-through pattern and increased L step length to facilitate R knee extension in terminal stance phase to allow improved R foot clearance via leather toe cap, 30-40 ft with close supervision-min guard. Quarter-inch heel wedge added to LLE decrease compensatory vaulting strategy with good carryover. Facilitated improved L step length and increased R stance time with kicking yoga block using LLE, min assist. Pt returned to room and left sitting in w/c with all needs within reach.   Therapy Documentation Precautions:  Precautions Precautions: Fall Precaution Comments: Dense R hemi Restrictions Weight Bearing Restrictions: No Pain: Pain Assessment Pain Assessment: No/denies pain  See  FIM for current functional status  Therapy/Group: Individual Therapy  Kerney Elbe 01/15/2014, 4:12 PM

## 2014-01-15 NOTE — Progress Notes (Signed)
Physical Therapy Session Note  Patient Details  Name: [name] MRN: [id number] Date of Birth: [birth year]  Today's Date: 01/15/2014 PT Individual Time: 7544-9201 PT Individual Time Calculation (min): 30 min   Short Term Goals: Week 2:  PT Short Term Goal 1 (Week 2): Pt will perform bed mobility with mod I via log roll technique. PT Short Term Goal 1 - Progress (Week 2): Met PT Short Term Goal 2 (Week 2): Pt will transfer bed <> w/c with mod I consistently.   PT Short Term Goal 2 - Progress (Week 2): Progressing toward goal PT Short Term Goal 3 (Week 2): Pt will ambulate 25 ft using LRAD and min A consistently.  PT Short Term Goal 3 - Progress (Week 2): Met PT Short Term Goal 4 (Week 2): Pt will negotiate 3 stairs using 1 rail and min A. PT Short Term Goal 4 - Progress (Week 2): Progressing toward goal  Skilled Therapeutic Interventions/Progress Updates:    Pt received seated in w/c wearing R AFO; agreeable to therapy. Session focused on stair negotiation, car transfers, and gait. Pt performed gait 2x60' in controlled environment with [facility] and min guard-min A, verbal cueing for lateral weight shift to L side and for L stance stability with effective within-session carryover. In ortho gym, pt performed car transfer with [facility] and supervision, cueing for technique. Pt negotiated 3 stairs x4 consecutive trials with single rail, step-to pattern and min A. Initial trial performed forward-facing with decreased stability during descent secondary to RLE adduction. Second trial performed laterally with LUE support at rail with noted lateral hip instability during descent. Final 2 trials performed forward-facing to ascend and backwards to descend with increased safety/stability as compared with previous trials. Session ended in pt room, where pt was left seated in w/c with all needs within reach.  Therapy Documentation Precautions:  Precautions Precautions: Fall Precaution Comments: Dense R  hemi Restrictions Weight Bearing Restrictions: No Pain: Pain Assessment Pain Assessment: No/denies pain Locomotion : Ambulation Ambulation/Gait Assistance: 4: Min guard;4: Min Financial controller Distance: 150   See FIM for current functional status  Therapy/Group: Individual Therapy  Hobble, Malva Cogan 01/15/2014, 7:52 PM

## 2014-01-15 NOTE — Progress Notes (Signed)
58 year old RH-male with history of HTN (no meds > 1 year) who was admitted to [facility] on 08/03/015 with two day history of fluctuating RUE weakness progressive to difficulty speaking and some swallowing issues. He reported being under a lot of stress, working 16 days straight and family forced him to seek medical assistance. UDS negative. TSH- 2.54. Hgb A1c- 601. CT head with recent left lenticulostriate infarct  Subjective/Complaints: Pt without shoulder pain last noc No bowel or bladder issues Review of Systems - Negative except R side weak Objective: Vital Signs: Blood pressure 136/82, pulse 63, temperature 98.4 F (36.9 C), temperature source Oral, resp. rate 18, height  (1.803 m), weight 98.6 kg (217 lb 6 oz), SpO2 100.00%. No results found. No results found for this or any previous visit (from the past 72 hour(s)).   HEENT: normal Cardio: RRR Resp: CTA B/L and unlabored GI: BS positive and NT,ND Extremity:  Pulses positive and No Edema Skin:   Intact Neuro: Alert/Oriented, Normal Sensory and Abnormal Motor trace biceps otherwise 0/5 in RUEexcept 2- R triceps,3- R HF, 2- hip/knee ext synergy,2- Hip Add and abd,  0/5 in R foot/ankle, increased tone R pecs, MAS 1 in FF Tone is reduce RUE  Musc/Skel:  Normal, external rotation  pain with R shoulder ROM,     Gen NAD   Assessment/Plan: 1. 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 Physiatrist is providing close team supervision and 24 hour management of active medical problems listed below. Physiatrist and rehab team continue to assess barriers to discharge/monitor patient progress toward functional and medical goals.  FIM: FIM - Bathing Bathing Steps Patient Completed: Chest;Right Arm;Abdomen;Front perineal area;Buttocks;Right upper leg;Left upper leg;Right lower leg (including foot);Left lower leg  (including foot) Bathing: 4: Min-Patient completes 8-9 61f 10 parts or 75+ percent  FIM - Upper Body Dressing/Undressing Upper body dressing/undressing steps patient completed: Thread/unthread right sleeve of pullover shirt/dresss;Thread/unthread left sleeve of pullover shirt/dress;Put head through opening of pull over shirt/dress;Pull shirt over trunk Upper body dressing/undressing: 5: Supervision: Safety issues/verbal cues FIM - Lower Body Dressing/Undressing Lower body dressing/undressing steps patient completed: Thread/unthread right underwear leg;Thread/unthread left underwear leg;Pull underwear up/down;Thread/unthread right pants leg;Thread/unthread left pants leg;Pull pants up/down;Don/Doff right sock;Don/Doff left sock;Don/Doff right shoe;Don/Doff left shoe Lower body dressing/undressing: 4: Steadying Assist  FIM - Toileting Toileting steps completed by patient: Performs perineal hygiene Toileting Assistive Devices: Grab bar or rail for support Toileting: 3: Mod-Patient completed 2 of 3 steps  FIM - Diplomatic Services operational officer Devices: Elevated toilet seat Toilet Transfers: 4-From toilet/BSC: Min A (steadying Pt. > 75%);4-To toilet/BSC: Min A (steadying Pt. > 75%)  FIM - Bed/Chair Transfer Bed/Chair Transfer Assistive Devices: Arm rests;Orthosis Bed/Chair Transfer: 5: Bed > Chair or W/C: Supervision (verbal cues/safety issues)  FIM - Locomotion: Wheelchair Distance: 150 Locomotion: Wheelchair: 6: Travels 150 ft or more, turns around, maneuvers to table, bed or toilet, negotiates 3% grade: maneuvers on rugs and over door sills independently FIM - Locomotion: Ambulation Locomotion: Ambulation Assistive Devices: Occupational hygienist Ambulation/Gait Assistance: 4: Min guard;4: Min assist Locomotion: Ambulation: 2: Travels 50 - 149 ft with minimal assistance (Pt.>75%)  Comprehension Comprehension Mode: Auditory Comprehension: 6-Follows complex conversation/direction: With  extra time/assistive device  Expression Expression Mode: Verbal Expression: 6-Expresses complex ideas: With extra time/assistive device  Social Interaction Social Interaction: 6-Interacts appropriately with others with medication or extra time (anti-anxiety, antidepressant).  Problem  Solving Problem Solving: 6-Solves complex problems: With extra time  Memory Memory: 6-More than reasonable amt of time  Medical Problem List and Plan:  1. 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  2. DVT Prophylaxis/Anticoagulation: Pharmaceutical: Lovenox.  3. Pain Management: N/A , R shoulder pain intermittent , cont ROM, no injection needed at this time 4. Mood: Motivated to get better. Will have LCSW follow for evaluation and support.  5. Neuropsych: This patient is capable of making decisions on his own behalf.  6. Skin/Wound Care: Monitor skin with routine checks. Maintain adequate hydration and nutritional status.  7. HTN: Will monitor BP every 8 hours. Avoid hypotension to allow adequate perfusion.  8. Dyslipidemia: Continue zocor.    LOS (Days) 19 A FACE TO FACE EVALUATION WAS PERFORMED  Demitria Hay E 01/15/2014, 6:35 AM

## 2014-01-15 NOTE — Plan of Care (Signed)
Problem: RH Stairs Goal: LTG Patient will ambulate up and down stairs w/assist (PT) LTG: Patient will ambulate up and down # of stairs with assistance (PT)  Downgraded transfers to S for safety, as pt continues to require cues to remain low and perform squat pivot transfer.  Also downgraded ambulation and stair goals as pt requires min A for steadying with gait due to lack of balance recovery/reactions and decreased ability to safely place RLE on stairs.

## 2014-01-15 NOTE — Progress Notes (Signed)
Occupational Therapy Session Note  Patient Details  Name: [name] MRN: [id number] Date of Birth: [birth year]  Today's Date: 01/15/2014 OT Individual Time: 6213-0865 and 1300-1330 OT Individual Time Calculation (min): 60 min and 30 min   Short Term Goals: Week 3:  OT Short Term Goal 1 (Week 3): short term goals = LTGs secondary to remaining LOS  Skilled Therapeutic Interventions/Progress Updates:    Session 1: Pt seen for ADL retraining with focus on functional transfers, dynamic standing balance, and functional use of RUE. Pt received sitting in w/c declining bathing in tub shower and requesting to shower in walk-in shower. Ambulated to bathroom with min assist using [facility] and no LOB noted. Used lateral lean technique for buttocks hygiene and bath mitt to assist with washing LUE. Pt completed bathing from EOB with increased time and steadying assist to manage clothing around waist. Completed oral care in standing at sink while weight bearing through RUE. Practiced tub transfer, via ambulating into bathroom with min assist. Engaged in RUE NMR activites from w/c and standing level. Completed 2 sets of towel glides with focus on shoulder protraction/retraction and adduction/abduction. Engaged in checkers game in standing while weight bearing through RUE and reaching to right to facilitate weight bearing through RLE as well. Pt returned to room and left with all needs in reach.   Session 2: Pt seen for 1:1 OT session with focus on NMR to RUE. Pt received sitting in w/c. Ambulated with [facility] to ortho gym with min assist and min cues for keeping head up. Engaged in prone on elbows and side lying on elbow while reaching towards target to facilitate weight bearing through RUE. Engaged in PNF pattern while supine on mat table. Pt with increased tightness in pec major, however pt with improved mobility with passive stretching. Completed sit<>stand with emphasis on weight bearing through RUE. Pt ambulated  back to room and left sitting in w/c with all needs in reach.    Therapy Documentation Precautions:  Precautions Precautions: Fall Precaution Comments: Dense R hemi Restrictions Weight Bearing Restrictions: No General:   Vital Signs:   Pain: Pain Assessment Pain Score: 0-No pain    See FIM for current functional status  Therapy/Group: Individual Therapy  [name], Vara Guardian 01/15/2014, 12:12 PM

## 2014-01-16 ENCOUNTER — Encounter (HOSPITAL_COMMUNITY): Payer: BC Managed Care – PPO | Admitting: Occupational Therapy

## 2014-01-16 ENCOUNTER — Inpatient Hospital Stay (HOSPITAL_COMMUNITY): Payer: BC Managed Care – PPO | Admitting: Physical Therapy

## 2014-01-16 NOTE — Progress Notes (Signed)
Recreational Therapy Discharge Summary Patient Details  Name: [name] MRN: [id number] Date of Birth: 04-Dec-1955 Today's Date: 01/16/2014  Long term goals set: 1  Long term goals met: 1   Comments on progress toward goals: Pt has made excellent progress toward goal and is ready for discharge home tomorrow with wife to provide/coordinate 24 hour supervision.  Pt requires min assist for standing activities & mod I for seated activities.    Reasons for discharge: discharge from hospital Patient/family agrees with progress made and goals achieved: Yes  Maude Hettich 01/16/2014, 12:49 PM

## 2014-01-16 NOTE — Progress Notes (Signed)
Social Work Patient ID: [name], male   DOB: [birth year], 57 y.o.   MRN: [id number] Met with pt and spoke with wife via telephone to discuss team conference progression toward goals and the need for family education tomorrow prior to discharge. She will be here tomorrow at 10;00 for PT.  Equipment being delivered today and follow up set up via [place].

## 2014-01-16 NOTE — Progress Notes (Signed)
Physical Therapy Session Note  Patient Details  Name: [name] MRN: [id number] Date of Birth: 1955-07-16  Today's Date: 01/16/2014 PT Individual Time: 1400-1500 PT Individual Time Calculation (min): 60 min   Short Term Goals: Week 3:  PT Short Term Goal 1 (Week 3): =LTG's due to ELOS  Skilled Therapeutic Interventions/Progress Updates:    Therapeutic Activity: PT instructs pt in chair to/from mat transfer with R AFO and quad cane req SBA for safety and armrests. Pt demonstrates mod I supine to/from sit transfer, utilizing L LE to assist R LE in getting on/off the mat. PT instructs pt to begin trying to see if he can get his R leg on/off the mat or bed with just R leg muscles, every day, in order to use the muscles that he has.   Therapeutic Exercise: PT instructs pt in HEP and gives him written handouts with parameters:  Modified crunches in w/c with hands interlaced, isometric arm adduction by side while squeezing interlaced hands together, isometric arm abduction/ER against w/c armrests, LAQ, seated knee flexion, bridges, hip IR in B hook lie, heel slides (AAROM), clam shells in L side lie, SLR: 2 x 10 reps each to R LE for LE exercises.   Pt was appreciative of getting set up on a HEP to tide him over until outpatient PT starts at d/c. Pt verbalizes understanding of all pictures. Continue per PT POC.   Therapy Documentation Precautions:  Precautions Precautions: Fall Precaution Comments: Dense R hemi Restrictions Weight Bearing Restrictions: No Pain: Pain Assessment Pain Assessment: No/denies pain Locomotion : Wheelchair Mobility Distance: 150   See FIM for current functional status  Therapy/Group: Individual Therapy  Jeralyn Nolden M 01/16/2014, 4:32 PM

## 2014-01-16 NOTE — Progress Notes (Signed)
Occupational Therapy Session Note  Patient Details  Name: [name] MRN: [id number] Date of Birth: 04/18/1956  Today's Date: 01/16/2014 OT Individual Time: 4098-1191 OT Individual Time Calculation (min): 60 min   Short Term Goals: Week 3:  OT Short Term Goal 1 (Week 3): short term goals = LTGs secondary to remaining LOS  Skilled Therapeutic Interventions/Progress Updates:  Pt with no c/o pain this session. Pt seated in wheelchair awaiting therapist arrival. OT session with focus on pt education,ADL retraining, safety, and dynamic standing balance. Pt ambulated to bathroom with use of quad cane and Min A for balance. Pt performed transfer onto tub bench with steady assist. Pt donned and doffed bathing mitt for R hand to use as gross assist for bathing. Pt obtained clothing prior to therapist arrival at wheelchair level. Pt performed UB dressing with supervision and increased time. LB dressing with steady assist for balance when pulling pants and underwear up B hips. Pt able to cross legs for LB dressing without assist this session. Pt also able to donn and doff B shoes and AFO brace. Grooming performed seated at sink side with supervision. Toilet transfer and toileting performed with Min A with use of quad cane for safety and balance. Pt making progress towards goals. Discharge scheduled for 01/17/14.   Therapy Documentation Precautions:  Precautions Precautions: Fall Precaution Comments: Dense R hemi Restrictions Weight Bearing Restrictions: No Pain: Pain Assessment Pain Assessment: No/denies pain Pain Score: 0-No pain  See FIM for current functional status  Therapy/Group: Individual Therapy  Lowella Grip 01/16/2014, 12:27 PM

## 2014-01-16 NOTE — Progress Notes (Signed)
58 year old RH-male with history of HTN (no meds > 1 year) who was admitted to [facility] on 08/03/015 with two day history of fluctuating RUE weakness progressive to difficulty speaking and some swallowing issues. He reported being under a lot of stress, working 16 days straight and family forced him to seek medical assistance. UDS negative. TSH- 2.54. Hgb A1c- 601. CT head with recent left lenticulostriate infarct  Subjective/Complaints: Right sided shoulder pain better , moving R arm more Review of Systems - Negative except R side weak Objective: Vital Signs: Blood pressure 143/76, pulse 68, temperature 98.7 F (37.1 C), temperature source Oral, resp. rate 18, height $RemoveBe'5\' 11"'zPAPPrACQ$  (1.803 m), weight 98.6 kg (217 lb 6 oz), SpO2 99.00%. No results found. No results found for this or any previous visit (from the past 72 hour(s)).   HEENT: normal Cardio: RRR Resp: CTA B/L and unlabored GI: BS positive and NT,ND Extremity:  Pulses positive and No Edema Skin:   Intact Neuro: Alert/Oriented, Normal Sensory and Abnormal Motor 2- biceps otherwise 0/5 in RUEexcept 2- R triceps,3- R HF, 2- hip/knee ext synergy,2- Hip Add and abd,  0/5 in R foot/ankle, increased tone R pecs, MAS 1 in FF Tone is reduce RUE  Musc/Skel:  Normal, external rotation  pain with R shoulder ROM,     Gen NAD   Assessment/Plan: 1. 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 Physiatrist is providing close team supervision and 24 hour management of active medical problems listed below. Physiatrist and rehab team continue to assess barriers to discharge/monitor patient progress toward functional and medical goals. Team conference today please see physician documentation under team conference tab, met with team face-to-face to discuss problems,progress, and goals. Formulized individual treatment plan based on  medical history, underlying problem and comorbidities.  Pt should be ready for d/c in am FIM: FIM - Bathing Bathing Steps Patient Completed: Chest;Right Arm;Abdomen;Front perineal area;Buttocks;Right upper leg;Left upper leg;Right lower leg (including foot);Left lower leg (including foot) Bathing: 4: Min-Patient completes 8-9 72f 10 parts or 75+ percent  FIM - Upper Body Dressing/Undressing Upper body dressing/undressing steps patient completed: Thread/unthread right sleeve of pullover shirt/dresss;Thread/unthread left sleeve of pullover shirt/dress;Put head through opening of pull over shirt/dress;Pull shirt over trunk Upper body dressing/undressing: 5: Supervision: Safety issues/verbal cues FIM - Lower Body Dressing/Undressing Lower body dressing/undressing steps patient completed: Thread/unthread right underwear leg;Thread/unthread left underwear leg;Pull underwear up/down;Thread/unthread right pants leg;Thread/unthread left pants leg;Pull pants up/down;Don/Doff right sock;Don/Doff left sock;Don/Doff right shoe;Don/Doff left shoe Lower body dressing/undressing: 4: Steadying Assist  FIM - Toileting Toileting steps completed by patient: Performs perineal hygiene Toileting Assistive Devices: Grab bar or rail for support Toileting: 3: Mod-Patient completed 2 of 3 steps  FIM - Radio producer Devices: Elevated toilet seat Toilet Transfers: 4-From toilet/BSC: Min A (steadying Pt. > 75%);4-To toilet/BSC: Min A (steadying Pt. > 75%)  FIM - Bed/Chair Transfer Bed/Chair Transfer Assistive Devices: Arm rests;Orthosis Bed/Chair Transfer: 5: Bed > Chair or W/C: Supervision (verbal cues/safety issues);6: Supine > Sit: No assist;6: Sit > Supine: No assist;5: Chair or W/C > Bed: Supervision (verbal cues/safety issues)  FIM - Locomotion: Wheelchair Distance: 150 Locomotion: Wheelchair: 6: Travels 150 ft or more, turns around, maneuvers to table, bed or toilet, negotiates 3%  grade: maneuvers on rugs and over door sills independently FIM - Locomotion: Ambulation Locomotion: Ambulation Assistive Devices: Nurse, adult Ambulation/Gait Assistance: 4: Min guard;4:  Min assist Locomotion: Ambulation: 2: Travels 50 - 149 ft with minimal assistance (Pt.>75%)  Comprehension Comprehension Mode: Auditory Comprehension: 6-Follows complex conversation/direction: With extra time/assistive device  Expression Expression Mode: Verbal Expression: 6-Expresses complex ideas: With extra time/assistive device  Social Interaction Social Interaction: 7-Interacts appropriately with others - No medications needed.  Problem Solving Problem Solving: 6-Solves complex problems: With extra time  Memory Memory: 6-More than reasonable amt of time  Medical Problem List and Plan:  1. 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  2. DVT Prophylaxis/Anticoagulation: Pharmaceutical: Lovenox.  3. Pain Management: N/A , R shoulder pain intermittent , cont ROM, no injection needed at this time 4. Mood: Motivated to get better. Will have LCSW follow for evaluation and support.  5. Neuropsych: This patient is capable of making decisions on his own behalf.  6. Skin/Wound Care: Monitor skin with routine checks. Maintain adequate hydration and nutritional status.  7. HTN: Will monitor BP every 8 hours. Avoid hypotension to allow adequate perfusion.  8. Dyslipidemia: Continue zocor.    LOS (Days) 20 A FACE TO FACE EVALUATION WAS PERFORMED  KIRSTEINS,ANDREW E 01/16/2014, 7:03 AM

## 2014-01-16 NOTE — Progress Notes (Signed)
Physical Therapy Session Note  Patient Details  Name: [name] MRN: [id number] Date of Birth: 1955/09/23  Today's Date: 01/16/2014 PT Individual Time: 1015-1115 PT Individual Time Calculation (min): 60 min   Short Term Goals: Week 3:  PT Short Term Goal 1 (Week 3): =LTG's due to ELOS  Skilled Therapeutic Interventions/Progress Updates:   Session focused on functional transfers, floor transfer, RLE NMR, gait, and stairs. Pt propelled w/c via L hemi technique 150 ft, mod I. Performed floor transfer in preparation fall recovery, min A to get onto floor and +2 assist at trunk and to prevent RLE from extending out from underneath him. Pt verbalized he should call EMS in case of fall at home and not attempt with family members at this time. Pt negotiated up/down 3 stairs using 1 rail x 2 with overall min guard, improved ability to control RLE placement with descent/decreased scissoring. For RLE NMR, pt performed 2 x 10 sit <> stand from mat with focus on anterior weight shift and decreased dependency on RUE as pt tends to translate B femurs anterior vs trunk anterior when transferring sit > stand. Pt performed furniture transfers to low compliant surfaces with continued focus on anterior weightshift and using LBQC, supervision. To challenge dynamic standing balance, pt participate in game of horseshoes with multidirectional reaching outside BOS and across midline with LUE to retrieve horseshoes before tossing, supervision with no LOB. Gait training with emphasis on step-through pattern initiating with LLE to facilitate RLE flexion in terminal stance in order to decrease vaulting and improve foot clearance with use of leather toe cap, and upright posture with forward gaze. Pt ambulated multiple trials x 50 ft using LBQC, overall supervision with occasional manual facilitation by therapist to decrease compensations. Pt ambulated x 15 ft in home environment using [facility], supervision to min guard. Gait  training x 100 ft using LBQC to return to room from gym, supervision. Pt left seated in w/c with all needs within reach.    Therapy Documentation Precautions:  Precautions Precautions: Fall Precaution Comments: Dense R hemi Restrictions Weight Bearing Restrictions: No Vital Signs: Therapy Vitals Pulse Rate: 77 BP: 145/89 mmHg Patient Position (if appropriate): Sitting Pain: Pain Assessment Pain Assessment: No/denies pain Pain Score: 0-No pain Locomotion : Ambulation Ambulation/Gait Assistance: 5: Supervision Wheelchair Mobility Distance: 150   See FIM for current functional status  Therapy/Group: Individual Therapy  Kerney Elbe 01/16/2014, 11:54 AM

## 2014-01-16 NOTE — Patient Care Conference (Signed)
Inpatient RehabilitationTeam Conference and Plan of Care Update Date: 01/16/2014   Time: 11;15 AM    Patient Name: [name]      Medical Record Number: [id number]  Date of Birth: [birth year] Sex: Male         Room/Bed: 4M02C/4M02C-01 Payor Info: Payor: [employer]UE [employer] / Plan: BCBS PPO OUT OF STATE / Product Type: *No Product type* /    Admitting Diagnosis: CVA  Admit Date/Time:  12/27/2013  1:28 PM Admission Comments: No comment available   Primary Diagnosis:  <principal problem not specified> Principal Problem: <principal problem not specified>  Patient Active Problem List   Diagnosis Date Noted  . CVA (cerebral infarction) 12/27/2013    Expected Discharge Date: Expected Discharge Date: 01/17/14  Team Members Present: Physician leading conference: Dr. Claudette Laws Social Worker Present: Dossie Der, LCSW Nurse Present: Carmie End, RN PT Present: Harriet Butte, PT;Tyliyah Mcmeekin Evelina Bucy, PT OT Present: Callie Fielding, OT SLP Present: Jackalyn Lombard, SLP PPS Coordinator present : Tora Duck, RN, CRRN     Current Status/Progress Goal Weekly Team Focus  Medical   medically stable , R shoulder pain intermittent  D/C to home   D/C planning   Bowel/Bladder   Continent of bowel and bladder; LBM 8/25  Continent of bowel and bladder  Offer bathroom/urinal PRN and monitor output   Swallow/Nutrition/ Hydration     WFL        ADL's   Min A for bathing to wash L UE and utilization of R UE with bath mitt for gross assist, toileting, and tub transfers, Steady A for LB dressing, sit to stand with supervision.   Min A level overall  D/C planning, pt/family education, dynamic standing balance, functional transfers, and self care, R UE self ROM and strenghtening   Mobility   Pt currently mod I with bed mobility, S for squat pivot transfers, min A for stand pivot transfers, min A for gait w/ LBQC, and min A for stairs (did much better with use of rail)  Mod I w/c goals, S transfers, min  A gait/stairs  D/C planning, pt/family education, R NMR, gait, balance, functional mobility   Communication     [facility]        Safety/Cognition/ Behavioral Observations    no unsafe behaviors        Pain   Denies  </=3  Assess pain qshift and PRN   Skin   No skin issues  No new skin breakdown  Assess skin qshift; ensure patient is repositioning for pressure relief      *See Care Plan and progress notes for long and short-term goals.  Barriers to Discharge: none    Possible Resolutions to Barriers:  d/c in am    Discharge Planning/Teaching Needs:  Home with wife and sister to provide 24 hr care, finish family education today.      Team Discussion:  Downgraded goals to min assist due to safety issues-finishing family education and AFO-bracing  Revisions to Treatment Plan:  D/C delayed due to pt's progress and need for family education-downgraded goals to min level   Continued Need for Acute Rehabilitation Level of Care: The patient requires daily medical management by a physician with specialized training in physical medicine and rehabilitation for the following conditions: Daily direction of a multidisciplinary physical rehabilitation program to ensure safe treatment while eliciting the highest outcome that is of practical value to the patient.: Yes Daily medical management of patient stability for increased activity during participation in  an intensive rehabilitation regime.: Yes Daily analysis of laboratory values and/or radiology reports with any subsequent need for medication adjustment of medical intervention for : Neurological problems  [name], [name] 01/16/2014, 1:40 PM

## 2014-01-17 ENCOUNTER — Inpatient Hospital Stay (HOSPITAL_COMMUNITY): Payer: BC Managed Care – PPO | Admitting: Physical Therapy

## 2014-01-17 ENCOUNTER — Encounter (HOSPITAL_COMMUNITY): Payer: BC Managed Care – PPO | Admitting: Occupational Therapy

## 2014-01-17 DIAGNOSIS — I10 Essential (primary) hypertension: Secondary | ICD-10-CM | POA: Diagnosis present

## 2014-01-17 DIAGNOSIS — I6330 Cerebral infarction due to thrombosis of unspecified cerebral artery: Secondary | ICD-10-CM

## 2014-01-17 DIAGNOSIS — E785 Hyperlipidemia, unspecified: Secondary | ICD-10-CM

## 2014-01-17 DIAGNOSIS — G8110 Spastic hemiplegia affecting unspecified side: Secondary | ICD-10-CM

## 2014-01-17 MED ORDER — LOSARTAN POTASSIUM 50 MG PO TABS: 50.0000 mg | ORAL_TABLET | Freq: Every day | ORAL | Status: DC

## 2014-01-17 MED ORDER — ASPIRIN 325 MG PO TBEC: 325.0000 mg | DELAYED_RELEASE_TABLET | Freq: Every day | ORAL | Status: DC

## 2014-01-17 MED ORDER — SIMVASTATIN 20 MG PO TABS: 20.0000 mg | ORAL_TABLET | Freq: Every day | ORAL | Status: DC

## 2014-01-17 NOTE — Progress Notes (Signed)
Occupational Therapy Discharge Summary  Patient Details  Name: [name] MRN: [id number] Date of Birth: [birth year]  Today's Date: 01/17/2014 OT Individual Time: 1000-1100 OT Individual Time Calculation (min): 60 min    Patient has met 10 of 10 long term goals due to improved activity tolerance, improved balance and ability to compensate for deficits.  Patient to discharge at Ava level.  Patient's care partner is independent to provide the necessary physical assistance at discharge.     Recommendation:  Patient will benefit from ongoing skilled OT services in outpatient setting to continue to advance functional skills in the area of BADL.  Equipment: tub transfer bench and drop arm commode chair  Reasons for discharge: treatment goals met  Patient/family agrees with progress made and goals achieved: Yes  OT intervention: (1000-1100) 60 minutes : Pt with no c/o pain this session. Wife and sister, his caregivers, present during session for additional family education and questions regarding discharge. Pt demonstrated HEP for R UE self ROM of shoulder,elbow, wrist, and digits x 5 reps independently with use of paper handout as needed. Pt ambulated into bathroom and performed tub transfer with Min A for steadying. Pt donned and doffed R hand bath mitt in order to utilize R UE as gross assist during bathing. Pt performed dressing tasks seated EOB utilizing cross over technique and hemiplegic dressing techniques to accomplish tasks. OT discussed risk factors for recurrent CVA. Pt discharged at this time with all goals met. Patient and family in agreement with discharge and no further questions.     OT Discharge Precautions/Restrictions  Restrictions Weight Bearing Restrictions: No Pain Pain Assessment Pain Assessment: No/denies pain Pain Score: 0-No pain Vision/Perception  Vision- History Baseline Vision/History: No visual deficits Patient Visual Report: No  change from baseline  Cognition Overall Cognitive Status: Within Functional Limits for tasks assessed Arousal/Alertness: Awake/alert Orientation Level: Oriented X4 Memory: Appears intact Awareness: Appears intact Problem Solving: Appears intact Safety/Judgment: Appears intact Sensation Sensation Light Touch: Impaired Detail Light Touch Impaired Details: Impaired RUE;Impaired RLE Stereognosis: Appears Intact Hot/Cold: Appears Intact Proprioception: Appears Intact Coordination Gross Motor Movements are Fluid and Coordinated: No Fine Motor Movements are Fluid and Coordinated: No Coordination and Movement Description: compensatory movement strategies due to R hemiplegia Motor  Motor Motor: Hemiplegia Motor - Discharge Observations: Pt with compensatory strategies to achieve movement patterns, able to attempt "normal" movement patterns with cuing  Mobility  Bed Mobility Bed Mobility: Supine to Sit;Sit to Supine Supine to Sit: 6: Modified independent (Device/Increase time) Sit to Supine: 6: Modified independent (Device/Increase time) Transfers Sit to Stand: 5: Supervision;With upper extremity assist Stand to Sit: 5: Supervision;With upper extremity assist  Trunk/Postural Assessment  Cervical Assessment Cervical Assessment: Within Functional Limits Thoracic Assessment Thoracic Assessment: Within Functional Limits Lumbar Assessment Lumbar Assessment: Within Functional Limits Postural Control Protective Responses: delayed but demonstrates stepping strategy with LOB  Balance Balance Balance Assessed: Yes Dynamic Sitting Balance Dynamic Sitting - Balance Support: Feet supported;During functional activity Dynamic Sitting - Level of Assistance: 7: Independent;6: Modified independent (Device/Increase time) Dynamic Sitting - Balance Activities: Forward lean/weight shifting;Reaching across midline;Reaching for objects;Lateral lean/weight shifting Static Standing Balance Static  Standing - Balance Support: Left upper extremity supported Static Standing - Level of Assistance: 6: Modified independent (Device/Increase time) Dynamic Standing Balance Dynamic Standing - Balance Support: During functional activity;Left upper extremity supported;No upper extremity supported Dynamic Standing - Level of Assistance: 5: Stand by assistance Extremity/Trunk Assessment RUE Assessment RUE Assessment: Within Functional Limits RUE Strength RUE Overall  Strength Comments:  (active scapular strengthening movements, internal rotation, elbow extension 2-/5.  0/5 for wrist and digits,, unable to move UE against gravity) LUE Assessment LUE Assessment: Within Functional Limits  See FIM for current functional status  [name] 01/17/2014, 12:26 PM

## 2014-01-17 NOTE — Progress Notes (Signed)
Social Work Patient ID: Dorothy Landgrebe, male   DOB: 10-20-1955, 57 y.o.   MRN: [id number] Have changed OP appointments to 9/1 1;00-2;00 and 9/2 8;30-9;15 due to [facility] appointment on Monday.  Wife to arrange PCP since has list of MD 's covered by insurance at home.  She will contact worker if problems with this.

## 2014-01-17 NOTE — Progress Notes (Signed)
Social Work Discharge Note Discharge Note  The overall goal for the admission was met for:   Discharge location: Yes-HOME WITH WIFE AND PT'S SISTER TO ASSIST-24 HR  Length of Stay: Yes-21 DAYS  Discharge activity level: Yes-SUPERVISION/MIN LEVEL  Home/community participation: Yes  Services provided included: MD, RD, PT, OT, SLP, RN, CM, TR, Pharmacy, Neuropsych and SW  Financial Services: Private Insurance: [place]  Follow-up services arranged: Outpatient: [facility]-PT & OT 8/31 1;15-3;15 and DME: [facility]-LBQC, WHEELCHAIR, TUB BENCH  Comments (or additional information):FAMILY EDUCATION COMPLETED WITH WIFE AND PT'S SISTER WHO WILL BE PROVIDING CARE.  BOTH FEEL COMFORTABLE WITH THIS WIFE HAS BEGUN APPLICATION FOR SSD.    Patient/Family verbalized understanding of follow-up arrangements: Yes  Individual responsible for coordination of the follow-up plan: SELF & ROSA-WIFE  Confirmed correct DME delivered: Elease Hashimoto 01/17/2014    Elease Hashimoto

## 2014-01-17 NOTE — Progress Notes (Signed)
Physical Therapy Discharge Summary  Patient Details  Name: [name] MRN: [id number] Date of Birth: [birth year]  Today's Date: 01/17/2014 PT Individual Time: 1100-1140 PT Individual Time Calculation (min): 40 min   Patient has met 12 of 12 long term goals due to improved activity tolerance, improved balance, improved postural control, increased strength, ability to compensate for deficits, functional use of  right upper extremity and right lower extremity and improved coordination.  Patient to discharge at a household ambulatory level Supervision-min assist. Patient's care partner is independent to provide the necessary physical assistance at discharge.  Reasons goals not met: NA  Recommendation:  Patient will benefit from ongoing skilled PT services in outpatient setting to continue to advance safe functional mobility, address ongoing impairments in RUE/LE muscle activation, timing and sequencing, static/dynamic standing balance, compensatory movement strategies, and minimize fall risk.  Equipment: manual w/c and [facility]  Reasons for discharge: treatment goals met and discharge from hospital  Patient/family agrees with progress made and goals achieved: Yes  Skilled Therapeutic Intervention Patient's wife and sister present for hands-on family training in preparation for discharge home this afternoon. Session focused on gait training 2 x 50 ft using [facility] with supervision provided by family, cues provided for upright head posture and forward gaze and step-through pattern. Family instructed in cues for anterior weight shift for improved safety and technique with sit <> stand, supervision. Pt performed car transfer to sedan height using LBQC, supervision. Pt demonstrated squat pivot transfers w/c <> mat table raised to height of bed at home, mod I. Pt performed bed mobility with mod I. Pt negotiated up/down 5 stairs using 1 rail x 3 with min guard provided by therapist, spouse, and sister.  Family instructed in w/c parts management for putting w/c in car and arm/leg rests. Family educated to call EMS in case of fall, purpose of outpatient PT, and recommendations for supervision for gait and stairs and mod I for squat pivot transfers, w/c mobility, and bed mobility. Patient independent with HEP. Patient and family with no further questions regarding discharge and independent with providing physical assist as needed.   PT Discharge Precautions/Restrictions Restrictions Weight Bearing Restrictions: No Vital Signs Therapy Vitals Temp: 98.7 F (37.1 C) Temp src: Oral Pulse Rate: 71 Resp: 18 BP: 128/74 mmHg Patient Position (if appropriate): Lying Oxygen Therapy SpO2: 97 % O2 Device: None (Room air) Pain Pain Assessment Pain Assessment: No/denies pain Vision/Perception   No changes from baseline  Cognition Overall Cognitive Status: Within Functional Limits for tasks assessed Arousal/Alertness: Awake/alert Orientation Level: Oriented X4 Memory: Appears intact Awareness: Appears intact Problem Solving: Appears intact Safety/Judgment: Appears intact Sensation Sensation Light Touch: Impaired Detail Light Touch Impaired Details: Impaired RUE;Impaired RLE Stereognosis: Appears Intact Hot/Cold: Appears Intact Proprioception: Appears Intact Coordination Gross Motor Movements are Fluid and Coordinated: No Fine Motor Movements are Fluid and Coordinated: No Coordination and Movement Description: compensatory movement strategies due to R hemiplegia Motor  Motor Motor: Hemiplegia Motor - Discharge Observations: Pt with compensatory strategies to achieve movement patterns, able to attempt "normal" movement patterns with cuing   Mobility Bed Mobility Bed Mobility: Supine to Sit;Sit to Supine Supine to Sit: 6: Modified independent (Device/Increase time) Sit to Supine: 6: Modified independent (Device/Increase time) Transfers Transfers: Yes Sit to Stand: 5: Supervision;With  upper extremity assist Stand to Sit: 5: Supervision;With upper extremity assist Squat Pivot Transfers: 5: Supervision;6: Modified independent (Device/Increase time) Locomotion  Ambulation Ambulation: Yes Ambulation/Gait Assistance: 5: Supervision Ambulation Distance (Feet): 50 Feet Assistive device: Large base  quad cane Ambulation/Gait Assistance Details: Requires verbal cues for step-through pattern leading with LLE to decrease compensatory contralateral vaulting and for upright head/forward gaze Gait Gait: Yes Gait Pattern: Impaired Gait Pattern: Step-through pattern;Step-to pattern;Decreased stance time - right;Decreased step length - left;Decreased dorsiflexion - right;Decreased hip/knee flexion - right;Decreased weight shift to left;Decreased trunk rotation;Lateral hip instability Gait velocity: decreased High Level Ambulation High Level Ambulation: Backwards walking;Direction changes Backwards Walking: min guard Direction Changes: min guard Stairs / Additional Locomotion Stairs: Yes Stairs Assistance: 4: Min guard Stairs Assistance Details (indicate cue type and reason): may require assist for RLE placement descending due to scissoring Stair Management Technique: One rail Left;Forwards;Step to pattern Number of Stairs: 5 Height of Stairs: 6 Curb: 4: Min assist;Other (comment) [facility]) Wheelchair Mobility Wheelchair Mobility: Yes Wheelchair Assistance: 6: Modified independent (Device/Increase time) Wheelchair Propulsion: Left upper extremity;Left lower extremity Wheelchair Parts Management: Independent Distance: 150  Trunk/Postural Assessment  Cervical Assessment Cervical Assessment: Within Functional Limits Thoracic Assessment Thoracic Assessment: Within Functional Limits Lumbar Assessment Lumbar Assessment: Within Functional Limits Postural Control Protective Responses: delayed but demonstrates stepping strategy with LOB  Balance Balance Balance Assessed:  Yes Extremity Assessment   RUE Assessment Defer to OT discharge summary LUE Assessment LUE Assessment: Within Functional Limits RLE Assessment RLE Assessment: Exceptions to [facility] RLE Strength RLE Overall Strength: Deficits RLE Overall Strength Comments:  0/5 ankle PF/DF and knee flexion, 2-/5 hip flexion, 3-/5 hip and knee extension  LLE Assessment LLE Assessment: Within Functional Limits  See FIM for current functional status  Laretta Alstrom 01/17/2014, 8:45 AM

## 2014-01-17 NOTE — Discharge Summary (Signed)
Physician Discharge Summary  Patient ID: [name] MRN: [id number] DOB/AGE: 1956/02/27 58 y.o.  Admit date: 12/27/2013 Discharge date: 01/17/2014  Discharge Diagnoses:  Principal Problem:   CVA (cerebral infarction) Active Problems:   HTN (hypertension)   Dyslipidemia   Discharged Condition:  Stable.  Significant Diagnostic Studies: No results found.  Labs:  Basic Metabolic Panel:    Component Value Date/Time   NA 141 12/28/2013 0850   K 4.5 12/28/2013 0850   CL 100 12/28/2013 0850   CO2 28 12/28/2013 0850   GLUCOSE 122* 12/28/2013 0850   BUN 16 12/28/2013 0850   CREATININE 1.04 12/28/2013 0850   CALCIUM 9.1 12/28/2013 0850   GFRNONAA 77* 12/28/2013 0850   GFRAA 89* 12/28/2013 0850     CBC:    Component Value Date/Time   WBC 5.5 12/28/2013 0850   RBC 5.24 12/28/2013 0850   HGB 14.9 12/28/2013 0850   HCT 44.3 12/28/2013 0850   PLT 257 12/28/2013 0850   MCV 84.5 12/28/2013 0850   MCH 28.4 12/28/2013 0850   MCHC 33.6 12/28/2013 0850   RDW 13.2 12/28/2013 0850   LYMPHSABS 1.4 12/28/2013 0850   MONOABS 0.4 12/28/2013 0850   EOSABS 0.1 12/28/2013 0850   BASOSABS 0.0 12/28/2013 0850     CBG: No results found for this basename: GLUCAP,  in the last 168 hours   Brief HPI: [name] is a 58 year old RH-male with history of HTN (no meds > 1 year) who was admitted to [facility] on 08/03/015 with two day history of fluctuating RUE with speed difficulty and  CT head with recent left lenticulostriate infarct. He was started on ASA for treatment and blood pressure noted to be labile and treated with hydralazine. He had worsening of symptoms with dense right hemiparesis that evening and follow up CCT with left basal ganglia infarct and no significant change. Neurology (Dr. Loretha Brasil) consulted for input and felt that patient with worsening of symptoms likely due to hypotension. MRI brain with acute nonhemorrhagic infarct left lenticular nucleus and mid corona radiata and tiny infarct anterior left frontal  opercular region.  He continues on ASA for secondary stroke prevention of thrombotic CVA.  Patient with subsequent right facial droop with mild dysarthria and right hemiparesis and CIR recommended by therapy team. ab.   Hospital Course: [name] was admitted to rehab 12/27/2013 for inpatient therapies to consist of PT, ST and OT at least three hours five days a week. Past admission physiatrist, therapy team and rehab RN have worked together to provide customized collaborative inpatient rehab. Blood pressures have been reasonably controlled. He's continent of bowel and bladder.  Mood has been stable and he has shown good motivation and progress. He has made good progress and was fitted with R-AFO to help with gait. He has reached supervision level at discharge. He will continue to receive outpatient PT and OT at [facility] past discharge.   Rehab course: During patient's stay in rehab weekly team conferences were held to monitor patient's progress, set goals and discuss barriers to discharge. Patient has had improvement in activity tolerance, balance, postural control, as well as ability to compensate for deficits. He is has had improvement in functional use RUE  and RLE as well as improved awareness. He requires min assist for bathing. He requires supervision for upper body dressing and steady assist for lower body dressing. He is modified independent for transfers and wheelchair mobility. He is able to ambulate 134feet with supervision to min/guard  assist with use of LBQC. He requires min-guard assist to navigate a flight of stairs. Patient and wife were educated on HEP as well as assistance with all aspects of care.     Disposition: 01-Home or Self Care  Diet:  Heart healthy.   Special Instructions: 1. Outpatient PT and OT at [facility]--Sept 1st at 1:-2:00 pm 2. Family to set appointment with local MD for follow up in next 2-3 weeks.      Medication List    STOP taking these medications        COLON CLEANSER Caps      TAKE these medications       aspirin 325 MG EC tablet  Take 1 tablet (325 mg total) by mouth daily.     Fish Oil 1000 MG Caps  Take 1,000 mg by mouth daily.     losartan 50 MG tablet  Commonly known as:  COZAAR  Take 1 tablet (50 mg total) by mouth daily.     MURINE TEARS FOR DRY EYES OP  Place 1 drop into both eyes 3 (three) times a week.     ONE-A-DAY MENS HEALTH FORMULA Tabs  Take 1 tablet by mouth daily.     simvastatin 20 MG tablet  Commonly known as:  ZOCOR  Take 1 tablet (20 mg total) by mouth daily at 6 PM.           Follow-up Information   Follow up with [name], MD On 02/18/2014. (Be there at 12 noon  for 12:30 pm  appointment)    Specialty:  Physical Medicine and Rehabilitation   Contact information:   [address] [place] [address] [place] [address] [phone]       Signed: Jacquelynn Cree 01/17/2014, 2:52 PM

## 2014-01-17 NOTE — Progress Notes (Signed)
58 year old RH-male with history of HTN (no meds > 1 year) who was admitted to [facility] on 08/03/015 with two day history of fluctuating RUE weakness progressive to difficulty speaking and some swallowing issues. He reported being under a lot of stress, working 16 days straight and family forced him to seek medical assistance. UDS negative. TSH- 2.54. Hgb A1c- 601. CT head with recent left lenticulostriate infarct  Subjective/Complaints: Right sided shoulder pain better , moving R arm more Review of Systems - Negative except R side weak Objective: Vital Signs: Blood pressure 128/74, pulse 71, temperature 98.7 F (37.1 C), temperature source Oral, resp. rate 18, height  (1.803 m), weight 98.1 kg (216 lb 4.3 oz), SpO2 97.00%. No results found. No results found for this or any previous visit (from the past 72 hour(s)).   HEENT: normal Cardio: RRR Resp: CTA B/L and unlabored GI: BS positive and NT,ND Extremity:  Pulses positive and No Edema Skin:   Intact Neuro: Alert/Oriented, Normal Sensory and Abnormal Motor 2- biceps otherwise 0/5 in RUEexcept 2- R triceps,3- R HF, 2- hip/knee ext synergy,2- Hip Add and abd,  0/5 in R foot/ankle, increased tone R pecs, MAS 1 in FF Tone is reduce RUE  Musc/Skel:  Normal, external rotation  pain with R shoulder ROM,     Gen NAD   Assessment/Plan: 1. 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 Physiatrist is providing close team supervision and 24 hour management of active medical problems listed below. Physiatrist and rehab team continue to assess barriers to discharge/monitor patient progress toward functional and medical goals. Stable for D/C today F/u PCP in 1-2 weeks F/u PM&R 3 weeks See D/C summary See D/C instructions FIM: FIM - Bathing Bathing Steps Patient Completed: Chest;Right Arm;Abdomen;Front perineal  area;Buttocks;Right upper leg;Left upper leg;Right lower leg (including foot);Left lower leg (including foot) Bathing: 4: Min-Patient completes 8-9 61f 10 parts or 75+ percent  FIM - Upper Body Dressing/Undressing Upper body dressing/undressing steps patient completed: Thread/unthread right sleeve of pullover shirt/dresss;Thread/unthread left sleeve of pullover shirt/dress;Put head through opening of pull over shirt/dress;Pull shirt over trunk Upper body dressing/undressing: 5: Supervision: Safety issues/verbal cues FIM - Lower Body Dressing/Undressing Lower body dressing/undressing steps patient completed: Thread/unthread right underwear leg;Thread/unthread left underwear leg;Pull underwear up/down;Thread/unthread right pants leg;Pull pants up/down;Thread/unthread left pants leg;Don/Doff right sock;Don/Doff right shoe;Fasten/unfasten right shoe;Don/Doff left shoe;Don/Doff left sock Lower body dressing/undressing: 4: Steadying Assist  FIM - Toileting Toileting steps completed by patient: Adjust clothing prior to toileting;Performs perineal hygiene;Adjust clothing after toileting Toileting Assistive Devices: Grab bar or rail for support Toileting: 4: Steadying assist  FIM - Diplomatic Services operational officer Devices: Elevated toilet seat Toilet Transfers: 4-From toilet/BSC: Min A (steadying Pt. > 75%);4-To toilet/BSC: Min A (steadying Pt. > 75%)  FIM - Bed/Chair Transfer Bed/Chair Transfer Assistive Devices: Arm rests;Cane;Orthosis Bed/Chair Transfer: 5: Chair or W/C > Bed: Supervision (verbal cues/safety issues)  FIM - Locomotion: Wheelchair Distance: 150 Locomotion: Wheelchair: 6: Travels 150 ft or more, turns around, maneuvers to table, bed or toilet, negotiates 3% grade: maneuvers on rugs and over door sills independently FIM - Locomotion: Ambulation Locomotion: Ambulation Assistive Devices: Occupational hygienist Ambulation/Gait Assistance: 5: Supervision Locomotion: Ambulation: 0:  Activity did not occur  Comprehension Comprehension Mode: Auditory Comprehension: 6-Follows complex conversation/direction: With extra time/assistive device  Expression Expression Mode: Verbal Expression: 6-Expresses complex ideas: With extra time/assistive device  Social Interaction Social  Interaction: 7-Interacts appropriately with others - No medications needed.  Problem Solving Problem Solving: 6-Solves complex problems: With extra time  Memory Memory: 6-More than reasonable amt of time  Medical Problem List and Plan:  1. 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  2. DVT Prophylaxis/Anticoagulation: Pharmaceutical: Lovenox.  3. Pain Management: N/A , R shoulder pain intermittent , cont ROM, no injection needed at this time 4. Mood: Motivated to get better. Will have LCSW follow for evaluation and support.  5. Neuropsych: This patient is capable of making decisions on his own behalf.  6. Skin/Wound Care: Monitor skin with routine checks. Maintain adequate hydration and nutritional status.  7. HTN: Will monitor BP every 8 hours. Avoid hypotension to allow adequate perfusion.  8. Dyslipidemia: Continue zocor.    LOS (Days) 21 A FACE TO FACE EVALUATION WAS PERFORMED  Janai Brannigan E 01/17/2014, 6:53 AM

## 2014-01-17 NOTE — Discharge Instructions (Signed)
Inpatient Rehab Discharge Instructions  [name] Discharge date and time:  01/17/14  Activities/Precautions/ Functional Status: Activity: activity as tolerated Diet: cardiac diet Wound Care: none needed  Functional status:  ___ No restrictions     ___ Walk up steps independently _X__ 24/7 supervision/assistance   ___ Walk up steps with assistance ___ Intermittent supervision/assistance  ___ Bathe/dress independently ___ Walk with walker     _X__ Bathe/dress with assistance ___ Walk Independently    ___ Shower independently ___ Walk with assistance    ___ Shower with assistance _X__ No alcohol     ___ Return to work/school ________  Special Instructions:    COMMUNITY REFERRALS UPON DISCHARGE:      Outpatient: PT & OT  Agency:[facility] [phone] Date of Last Service:01/17/2014  Appointment Date/Time:AUGUST 31 Monday 1;15-3;15 PM  Medical Equipment/Items Ordered:WHEELCHAIR, [name] BENCH  Agency/Supplier:[facility]    [phone] Other:[facility] APPLICATION STARTED  GENERAL COMMUNITY RESOURCES FOR PATIENT/FAMILY: Support Groups:[facility] SUPPORT GROUP  STROKE/TIA DISCHARGE INSTRUCTIONS SMOKING Cigarette smoking nearly doubles your risk of having a stroke & is the single most alterable risk factor  If you smoke or have smoked in the last 12 months, you are advised to quit smoking for your health.  Most of the excess cardiovascular risk related to smoking disappears within a year of stopping.  Ask you doctor about anti-smoking medications  [place] Quit Line: 1-800-QUIT NOW  Free Smoking Cessation Classes [phone]  CHOLESTEROL Know your levels; limit fat & cholesterol in your diet  Lipid Panel  No results found for this basename: chol, trig, hdl, cholhdl, vldl, ldlcalc      Many patients benefit from treatment even if their cholesterol is at goal.  Goal: Total Cholesterol (CHOL) less than 160  Goal:  Triglycerides (TRIG) less than 150  Goal:  HDL  greater than 40  Goal:  LDL (LDLCALC) less than 100   BLOOD PRESSURE American Stroke Association blood pressure target is less that 120/80 mm/Hg  Your discharge blood pressure is:  BP: 145/78 mmHg  Monitor your blood pressure  Limit your salt and alcohol intake  Many individuals will require more than one medication for high blood pressure  DIABETES (A1c is a blood sugar average for last 3 months) Goal HGBA1c is under 7% (HBGA1c is blood sugar average for last 3 months)  Diabetes: No known diagnosis of diabetes    No results found for this basename: HGBA1C     Your HGBA1c can be lowered with medications, healthy diet, and exercise.  Check your blood sugar as directed by your physician  Call your physician if you experience unexplained or low blood sugars.  PHYSICAL ACTIVITY/REHABILITATION Goal is 30 minutes at least 4 days per week  Activity: No driving, Therapies: See above Return to work:  To be decided on follow up.   Activity decreases your risk of heart attack and stroke and makes your heart stronger.  It helps control your weight and blood pressure; helps you relax and can improve your mood.  Participate in a regular exercise program.  Talk with your doctor about the best form of exercise for you (dancing, walking, swimming, cycling).  DIET/WEIGHT Goal is to maintain a healthy weight  Your discharge diet is: Cardiac thin liquids Your height is:  Height:  (180.3 cm) Your current weight is: Weight: 98.6 kg (217 lb 6 oz) Your Body Mass Index (BMI) is:  BMI (Calculated): 30.3  Following the type of diet specifically designed for you  will help prevent another stroke.  Your goal weight is:   Your goal Body Mass Index (BMI) is 19-24.  Healthy food habits can help reduce 3 risk factors for stroke:  High cholesterol, hypertension, and excess weight.  RESOURCES Stroke/Support Group:  Call [phone]   STROKE EDUCATION PROVIDED/REVIEWED AND GIVEN TO PATIENT Stroke  warning signs and symptoms How to activate emergency medical system (call 911). Medications prescribed at discharge. Need for follow-up after discharge. Personal risk factors for stroke. Pneumonia vaccine given:  Flu vaccine given:  My questions have been answered, the writing is legible, and I understand these instructions.  I will adhere to these goals & educational materials that have been provided to me after my discharge from the hospital.       My questions have been answered and I understand these instructions. I will adhere to these goals and the provided educational materials after my discharge from the hospital.  Patient/Caregiver Signature _______________________________ Date __________  Clinician Signature _______________________________________ Date __________  Please bring this form and your medication list with you to all your follow-up doctor's appointments.

## 2014-01-22 ENCOUNTER — Encounter: Payer: Self-pay | Admitting: Physical Medicine & Rehabilitation

## 2014-01-23 ENCOUNTER — Telehealth: Payer: Self-pay

## 2014-01-23 NOTE — Telephone Encounter (Signed)
Attempted to contact Lurena Joiner. Left a voicemail informing her per Dr. Wynn Banker to please fax a new form to Jesusita Oka or Pam.

## 2014-01-23 NOTE — Telephone Encounter (Signed)
Please get new form faxed to Jesusita Oka or Elita Quick

## 2014-01-23 NOTE — Telephone Encounter (Signed)
[name](SW @ [facility]) states patient's wife needs a form for FMLA that states patient needs 24 hour care. The FMLA form that was previously filled out only said the patient needed 3 hours of care.

## 2014-01-24 ENCOUNTER — Telehealth: Payer: Self-pay | Admitting: *Deleted

## 2014-01-24 NOTE — Telephone Encounter (Signed)
Andrea Sue Lushled today to let us know of some BP's on [name] in their clinic today (first time seeing him).  His pre was 176/94 and recheck 167/100 (both manual and electric)  After rest it was 176/98 and after having him lay supine 175/95.  He is taking his Losartan as prescribed.  They talked with him about getting a home BP monitor. I mentioned the family was supposed to be finding him a PCP and they need to follow up on that.  If BP continues to elevate or has symptoms of dizziness headache blurred vision, numbness in face or extremeties he needs to present to the ED for evaluation.  Otherwise I will forward this message on to Dr Wynn Banker. He has hosp follow up appt 02/18/14

## 2014-01-24 NOTE — Telephone Encounter (Signed)
May increase Losartan to 2 tablets ( ) per day until he gets in with PCP Also pt should be on no added salt diet

## 2014-01-25 NOTE — Telephone Encounter (Signed)
Contacted patient to inform him of the message below. Patient states he rechecked his BP at home yesterday afternoon and it was 135/80. Does patient still need to increase Losartan. Also advised patient to continue monitoring his BP.

## 2014-01-25 NOTE — Telephone Encounter (Signed)
May cont current dose

## 2014-01-25 NOTE — Telephone Encounter (Signed)
Contacted patient to inform him per Dr. Wynn Banker to continue current dose of Losartan. Patient verbalized understanding.

## 2014-01-30 ENCOUNTER — Emergency Department: Payer: Self-pay | Admitting: Emergency Medicine

## 2014-01-30 ENCOUNTER — Telehealth: Payer: Self-pay

## 2014-01-30 LAB — COMPREHENSIVE METABOLIC PANEL
ALBUMIN: 3.7 g/dL (ref 3.4–5.0)
ALT: 37 U/L
AST: 16 U/L (ref 15–37)
Alkaline Phosphatase: 98 U/L
Anion Gap: 4 — ABNORMAL LOW (ref 7–16)
BUN: 12 mg/dL (ref 7–18)
Bilirubin,Total: 0.2 mg/dL (ref 0.2–1.0)
CALCIUM: 9.3 mg/dL (ref 8.5–10.1)
Chloride: 101 mmol/L (ref 98–107)
Co2: 31 mmol/L (ref 21–32)
Creatinine: 0.97 mg/dL (ref 0.60–1.30)
Glucose: 105 mg/dL — ABNORMAL HIGH (ref 65–99)
OSMOLALITY: 272 (ref 275–301)
Potassium: 3.8 mmol/L (ref 3.5–5.1)
Sodium: 136 mmol/L (ref 136–145)
TOTAL PROTEIN: 8 g/dL (ref 6.4–8.2)

## 2014-01-30 LAB — CBC
HCT: 45.7 % (ref 40.0–52.0)
HGB: 14.6 g/dL (ref 13.0–18.0)
MCH: 27.4 pg (ref 26.0–34.0)
MCHC: 31.9 g/dL — AB (ref 32.0–36.0)
MCV: 86 fL (ref 80–100)
Platelet: 280 10*3/uL (ref 150–440)
RBC: 5.31 10*6/uL (ref 4.40–5.90)
RDW: 13.3 % (ref 11.5–14.5)
WBC: 5.2 10*3/uL (ref 3.8–10.6)

## 2014-01-30 LAB — CK TOTAL AND CKMB (NOT AT ARMC)
CK, Total: 263 U/L
CK-MB: 0.9 ng/mL (ref 0.5–3.6)

## 2014-01-30 LAB — TROPONIN I

## 2014-01-30 NOTE — Telephone Encounter (Signed)
Patient called regarding his elevated blood pressure.  Advised patient to contact PCP or go to urgent care to get it under control.  Patient understands.

## 2014-02-13 ENCOUNTER — Other Ambulatory Visit (HOSPITAL_COMMUNITY): Payer: Self-pay | Admitting: Physical Medicine and Rehabilitation

## 2014-02-18 ENCOUNTER — Encounter: Payer: Self-pay | Admitting: Physical Medicine & Rehabilitation

## 2014-02-18 ENCOUNTER — Encounter: Payer: BC Managed Care – PPO | Attending: Physical Medicine & Rehabilitation

## 2014-02-18 ENCOUNTER — Ambulatory Visit (HOSPITAL_BASED_OUTPATIENT_CLINIC_OR_DEPARTMENT_OTHER): Payer: BC Managed Care – PPO | Admitting: Physical Medicine & Rehabilitation

## 2014-02-18 VITALS — BP 194/100 | HR 88 | Resp 14 | Ht 71.5 in | Wt 220.0 lb

## 2014-02-18 DIAGNOSIS — I69959 Hemiplegia and hemiparesis following unspecified cerebrovascular disease affecting unspecified side: Secondary | ICD-10-CM | POA: Insufficient documentation

## 2014-02-18 DIAGNOSIS — I10 Essential (primary) hypertension: Secondary | ICD-10-CM | POA: Diagnosis not present

## 2014-02-18 DIAGNOSIS — G8110 Spastic hemiplegia affecting unspecified side: Secondary | ICD-10-CM | POA: Diagnosis present

## 2014-02-18 NOTE — Progress Notes (Signed)
Subjective:    Patient ID: [name], male    DOB: [birth year], 58 y.o.   MRN: [id number]  HPI Admit date: 12/27/2013  Discharge date: 01/17/2014  58 year old RH-male with history of HTN (no meds > 1 year) who was admitted to [facility] on 08/03/015 with two day history of fluctuating RUE with speed difficulty and  CT head with recent left lenticulostriate infarct. He was started on ASA for treatment and blood pressure noted to be labile and treated with hydralazine. He had worsening of symptoms with dense right hemiparesis that evening and follow up CCT with left basal ganglia infarct and no significant change. Neurology (Dr. Loretha Brasil) consulted for input and felt that patient with worsening of symptoms likely due to hypotension. MRI brain with acute nonhemorrhagic infarct left lenticular nucleus and mid corona radiata and tiny infarct anterior left frontal opercular region.   He continues on ASA for secondary stroke prevention of thrombotic CVA  He requires supervision for upper body dressing and steady assist for lower body dressing. He is modified independent for transfers and wheelchair mobility. He is able to ambulate 136feet with supervision to min/guard assist with use of LBQC. He requires min-guard assist to navigate a flight of stairs  PT and OT twice a week at [facility]--  Saw PCP 3d ago, med changes to start  [facility] a long way from car to office Uses quad cane no assist needed  Has his own blood pressure monitoring device which at home shows normal readings. Was in the 140/80 range after primary care visit on Friday    Pain Inventory Average Pain 4 Pain Right Now 4 My pain is intermittent  In the last 24 hours, has pain interfered with the following? General activity 2 Relation with others 2 Enjoyment of life 2 What TIME of day is your pain at its worst? daytime, night Sleep (in general) Fair  Pain is worse with: some activites Pain improves with: medication Relief from  Meds: 8  Mobility use a cane  Function Last day worked December 22, 2013  Neuro/Psych trouble walking spasms  Prior Studies no further imaging studies  Physicians involved in your care Any changes since last visit?  no   Family History  Problem Relation Age of Onset  . Hypertension Mother    History   Social History  . Marital Status: Unknown    Spouse Name: N/A    Number of Children: N/A  . Years of Education: N/A   Social History Main Topics  . Smoking status: Never Smoker   . Smokeless tobacco: None  . Alcohol Use: No  . Drug Use: No  . Sexual Activity: None   Other Topics Concern  . None   Social History Narrative  . None   Past Surgical History  Procedure Laterality Date  . Knee arthroscopy Right    History reviewed. No pertinent past medical history. BP 194/100  Pulse 88  Resp 14  Ht 5' 11.5" (1.816 m)  Wt 220 lb (99.791 kg)  BMI 30.26 kg/m2  SpO2 100%  Opioid Risk Score:   Fall Risk Score: Low Fall Risk (0-5 points)  Review of Systems     Objective:   Physical Exam  Nursing note and vitals reviewed. Constitutional: He is oriented to person, place, and time. He appears well-developed.  HENT:  Head: Normocephalic and atraumatic.  Eyes: Conjunctivae and EOM are normal. Pupils are equal, round, and reactive to light.  Neurological: He is alert and oriented  to person, place, and time. No sensory deficit. He exhibits abnormal muscle tone. Coordination and gait abnormal.  Motor strength 2 minus in the right deltoid bicep triceps and grip 3 minus in the right hip flexor knee extensor  trace ankle dorsiflexor  Increased tone in the right finger flexors modified Ashworth scale 2 Increased tone right biceps modified Ashworth scale 3  Ambulates with cane and AFO. No toe drag or knee instability Right elbow flexion increases with ambulation elbow held at 90 during ambulation  Fine motor skills absent in the right hand    Psychiatric: He has a  normal mood and affect.          Assessment & Plan:  1. 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 Increased tone at the biceps during ambulation may inhibit balance. Would recommend Botox 200 units to the right biceps. Continue outpatient PT and OT.  Has dorsum of hand edema on the right side. Continue OT, range of motion, retrograde massage and anti-edema techniques.

## 2014-02-18 NOTE — Patient Instructions (Signed)
OnabotulinumtoxinA injection (Medical Use) What is this medicine? ONABOTULINUMTOXINA (o na BOTT you lye num tox in eh) is a neuro-muscular blocker. This medicine is used to treat crossed eyes, eyelid spasms, severe neck muscle spasms, and elbow, wrist, and finger muscle spasms. It is also used to treat excessive underarm sweating, to prevent chronic migraine headaches, and to treat loss of bladder control due to neurologic conditions such as multiple sclerosis or spinal cord injury. This medicine may be used for other purposes; ask your health care provider or pharmacist if you have questions. COMMON BRAND NAME(S): Botox What should I tell my health care provider before I take this medicine? They need to know if you have any of these conditions: -breathing problems -cerebral palsy spasms -difficulty urinating -heart problems -history of surgery where this medicine is going to be used -infection at the site where this medicine is going to be used -myasthenia gravis or other neurologic disease -nerve or muscle disease -surgery plans -take medicines that treat or prevent blood clots -thyroid problems -an unusual or allergic reaction to botulinum toxin, albumin, other medicines, foods, dyes, or preservatives -pregnant or trying to get pregnant -breast-feeding How should I use this medicine? This medicine is for injection into a muscle. It is given by a health care professional in a hospital or clinic setting. Talk to your pediatrician regarding the use of this medicine in children. While this drug may be prescribed for children as young as 12 years old for selected conditions, precautions do apply. Overdosage: If you think you have taken too much of this medicine contact a poison control center or emergency room at once. NOTE: This medicine is only for you. Do not share this medicine with others. What if I miss a dose? This does not apply. What may interact with this  medicine? -aminoglycoside antibiotics like gentamicin, neomycin, tobramycin -muscle relaxants -other botulinum toxin injections This list may not describe all possible interactions. Give your health care provider a list of all the medicines, herbs, non-prescription drugs, or dietary supplements you use. Also tell them if you smoke, drink alcohol, or use illegal drugs. Some items may interact with your medicine. What should I watch for while using this medicine? Visit your doctor for regular check ups. This medicine will cause weakness in the muscle where it is injected. Tell your doctor if you feel unusually weak in other muscles. Get medical help right away if you have problems with breathing, swallowing, or talking. This medicine might make your eyelids droop or make you see blurry or double. If you have weak muscles or trouble seeing do not drive a car, use machinery, or do other dangerous activities. This medicine contains albumin from human blood. It may be possible to pass an infection in this medicine, but no cases have been reported. Talk to your doctor about the risks and benefits of this medicine. If your activities have been limited by your condition, go back to your regular routine slowly after treatment with this medicine. What side effects may I notice from receiving this medicine? Side effects that you should report to your doctor or health care professional as soon as possible: -allergic reactions like skin rash, itching or hives, swelling of the face, lips, or tongue -breathing problems -changes in vision -chest pain or tightness -eye irritation, pain -fast, irregular heartbeat -infection -numbness -speech problems -swallowing problems -unusual weakness Side effects that usually do not require medical attention (report to your doctor or health care professional if they continue or   are bothersome): -bruising or pain at site where injected -drooping eyelid -dry eyes or  mouth -headache -muscles aches, pains -sensitivity to light -tearing This list may not describe all possible side effects. Call your doctor for medical advice about side effects. You may report side effects to FDA at 1-800-FDA-1088. Where should I keep my medicine? This drug is given in a hospital or clinic and will not be stored at home. NOTE: This sheet is a summary. It may not cover all possible information. If you have questions about this medicine, talk to your doctor, pharmacist, or health care provider.  2015, Elsevier/Gold Standard. (2012-02-07 17:30:24)  

## 2014-02-21 ENCOUNTER — Encounter: Payer: Self-pay | Admitting: Physical Medicine & Rehabilitation

## 2014-03-06 ENCOUNTER — Telehealth: Payer: Self-pay | Admitting: *Deleted

## 2014-03-06 NOTE — Telephone Encounter (Signed)
Need written RX for NMES unit for this patient.  [facility] [phone]

## 2014-03-07 ENCOUNTER — Emergency Department: Payer: Self-pay | Admitting: Emergency Medicine

## 2014-03-07 LAB — CBC
HCT: 42.3 % (ref 40.0–52.0)
HGB: 13.2 g/dL (ref 13.0–18.0)
MCH: 26.9 pg (ref 26.0–34.0)
MCHC: 31.3 g/dL — AB (ref 32.0–36.0)
MCV: 86 fL (ref 80–100)
Platelet: 248 10*3/uL (ref 150–440)
RBC: 4.91 10*6/uL (ref 4.40–5.90)
RDW: 13.6 % (ref 11.5–14.5)
WBC: 4.7 10*3/uL (ref 3.8–10.6)

## 2014-03-07 LAB — BASIC METABOLIC PANEL
ANION GAP: 7 (ref 7–16)
BUN: 11 mg/dL (ref 7–18)
CO2: 27 mmol/L (ref 21–32)
Calcium, Total: 8.4 mg/dL — ABNORMAL LOW (ref 8.5–10.1)
Chloride: 105 mmol/L (ref 98–107)
Creatinine: 1.05 mg/dL (ref 0.60–1.30)
EGFR (Non-African Amer.): >60
Glucose: 125 mg/dL — ABNORMAL HIGH (ref 65–99)
Osmolality: 278 (ref 275–301)
Potassium: 3.6 mmol/L (ref 3.5–5.1)
Sodium: 139 mmol/L (ref 136–145)

## 2014-03-07 LAB — TROPONIN I: Troponin-I: <0.02 ng/mL

## 2014-03-08 NOTE — Telephone Encounter (Signed)
Faxed to number given.

## 2014-03-24 ENCOUNTER — Encounter: Payer: Self-pay | Admitting: Physical Medicine & Rehabilitation

## 2014-04-06 ENCOUNTER — Other Ambulatory Visit: Payer: Self-pay | Admitting: Physical Medicine and Rehabilitation

## 2014-04-23 ENCOUNTER — Encounter: Payer: Self-pay | Admitting: Physical Medicine & Rehabilitation

## 2014-04-23 ENCOUNTER — Ambulatory Visit (HOSPITAL_BASED_OUTPATIENT_CLINIC_OR_DEPARTMENT_OTHER): Payer: BC Managed Care – PPO | Admitting: Physical Medicine & Rehabilitation

## 2014-04-23 ENCOUNTER — Encounter: Payer: BC Managed Care – PPO | Attending: Physical Medicine & Rehabilitation

## 2014-04-23 VITALS — BP 160/78 | HR 86 | Resp 14 | Ht 71.5 in | Wt 215.2 lb

## 2014-04-23 DIAGNOSIS — M25511 Pain in right shoulder: Secondary | ICD-10-CM | POA: Insufficient documentation

## 2014-04-23 DIAGNOSIS — I69351 Hemiplegia and hemiparesis following cerebral infarction affecting right dominant side: Secondary | ICD-10-CM | POA: Diagnosis present

## 2014-04-23 DIAGNOSIS — S43001A Unspecified subluxation of right shoulder joint, initial encounter: Secondary | ICD-10-CM | POA: Insufficient documentation

## 2014-04-23 DIAGNOSIS — S43001D Unspecified subluxation of right shoulder joint, subsequent encounter: Secondary | ICD-10-CM

## 2014-04-23 DIAGNOSIS — I6330 Cerebral infarction due to thrombosis of unspecified cerebral artery: Secondary | ICD-10-CM

## 2014-04-23 DIAGNOSIS — G8110 Spastic hemiplegia affecting unspecified side: Secondary | ICD-10-CM

## 2014-04-23 DIAGNOSIS — Z7982 Long term (current) use of aspirin: Secondary | ICD-10-CM | POA: Diagnosis not present

## 2014-04-23 HISTORY — DX: Unspecified subluxation of right shoulder joint, initial encounter: S43.001A

## 2014-04-23 NOTE — Progress Notes (Signed)
Subjective:    Patient ID: [name], male    DOB: 05/17/1956, 58 y.o.   MRN: [id number] 58 year old RH-male with history of HTN (no meds > 1 year) who was admitted to [facility] on 08/03/015 with two day history of fluctuating RUE with speed difficulty and CT head with recent left lenticulostriate infarct. He was started on ASA for treatment and blood pressure noted to be labile and treated with hydralazine. He had worsening of symptoms with dense right hemiparesis that evening and follow up CCT with left basal ganglia infarct and no significant change. Neurology (Dr. Loretha BrasilZeylikman) consulted for input and felt that patient with worsening of symptoms likely due to hypotension. MRI brain with acute nonhemorrhagic infarct left lenticular nucleus and mid corona radiata and tiny infarct anterior left frontal opercular region.  He continues on ASA for secondary stroke prevention Admit date: 12/27/2013  Discharge date: 01/17/2014  HPI Still attends PT and OT at [facility] Right elbow flexion during ambulation Sublux is improved Patient states he moved his thumb on the right side a little bit in therapy Seen by ENT, exam was normal, hearing eval was good  Pain Inventory Average Pain 5 Pain Right Now 5 My pain is intermittent, dull and aching  In the last 24 hours, has pain interfered with the following? General activity 4 Relation with others 4 Enjoyment of life 4 What TIME of day is your pain at its worst? night Sleep (in general) Fair  Pain is worse with: shoulder pain Pain improves with: therapy/exercise Relief from Meds: 5  Mobility use a cane ability to climb steps?  no do you drive?  yes  Function disabled: date disabled .  Neuro/Psych No problems in this area  Prior Studies Any changes since last visit?  no  Physicians involved in your care Any changes since last visit?  no   Family History  Problem Relation Age of Onset  . Hypertension Mother    History   Social  History  . Marital Status: Married    Spouse Name: N/A    Number of Children: N/A  . Years of Education: N/A   Social History Main Topics  . Smoking status: Never Smoker   . Smokeless tobacco: None  . Alcohol Use: No  . Drug Use: No  . Sexual Activity: None   Other Topics Concern  . None   Social History Narrative   Past Surgical History  Procedure Laterality Date  . Knee arthroscopy Right    History reviewed. No pertinent past medical history. BP 160/78 mmHg  Pulse 86  Resp 14  Ht 5' 11.5" (1.816 m)  Wt 215 lb 3.2 oz (97.614 kg)  BMI 29.60 kg/m2  SpO2 99%  Opioid Risk Score:   Fall Risk Score: Low Fall Risk (0-5 points)  Review of Systems  Constitutional: Negative.   HENT: Negative.   Eyes: Negative.   Respiratory: Negative.   Cardiovascular: Negative.   Gastrointestinal: Negative.   Endocrine: Negative.   Genitourinary: Negative.   Musculoskeletal: Negative.   Skin: Negative.   Allergic/Immunologic: Negative.   Neurological: Negative.   Hematological: Negative.   Psychiatric/Behavioral: Negative.        Objective:   Physical Exam  Constitutional: He is oriented to person, place, and time. He appears well-developed and well-nourished.  HENT:  Head: Normocephalic and atraumatic.  Eyes: Conjunctivae are normal. Pupils are equal, round, and reactive to light.  Musculoskeletal:       Right shoulder: He exhibits decreased  range of motion and deformity.  1  FB subluxation RIght shoulder Pain with impingement  Neurological: He is alert and oriented to person, place, and time.  Psychiatric: He has a normal mood and affect.  Nursing note and vitals reviewed.  Trace right shoulder retraction mainly scapular, trace biceps in synergy pattern no active movement at the wrist or fingers.  Ambulates with right AFO, occasional knee hyperextension      Assessment & Plan:  1. 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 Increased tone at the biceps during ambulation may inhibit balance.  No driving, poor RLE control, absent RUE fxn  2.  Shoulder pain post CVA multifactorial, subluxation, impingement, adhesive capsulitis, pec tightness Continue outpatient PT and OT.

## 2014-04-23 NOTE — Patient Instructions (Signed)
If tightness increases then may need Botox

## 2014-05-08 ENCOUNTER — Other Ambulatory Visit: Payer: Self-pay | Admitting: Physical Medicine & Rehabilitation

## 2014-05-12 ENCOUNTER — Other Ambulatory Visit: Payer: Self-pay | Admitting: Physical Medicine & Rehabilitation

## 2014-05-24 ENCOUNTER — Encounter: Payer: Self-pay | Admitting: Physical Medicine & Rehabilitation

## 2014-05-24 DIAGNOSIS — K56609 Unspecified intestinal obstruction, unspecified as to partial versus complete obstruction: Secondary | ICD-10-CM

## 2014-05-24 DIAGNOSIS — A419 Sepsis, unspecified organism: Secondary | ICD-10-CM

## 2014-05-24 DIAGNOSIS — N189 Chronic kidney disease, unspecified: Secondary | ICD-10-CM

## 2014-05-24 HISTORY — DX: Sepsis, unspecified organism: A41.9

## 2014-05-24 HISTORY — DX: Unspecified intestinal obstruction, unspecified as to partial versus complete obstruction: K56.609

## 2014-05-24 HISTORY — DX: Chronic kidney disease, unspecified: N18.9

## 2014-06-04 ENCOUNTER — Ambulatory Visit (HOSPITAL_BASED_OUTPATIENT_CLINIC_OR_DEPARTMENT_OTHER): Payer: BLUE CROSS/BLUE SHIELD | Admitting: Physical Medicine & Rehabilitation

## 2014-06-04 ENCOUNTER — Encounter: Payer: Self-pay | Admitting: Physical Medicine & Rehabilitation

## 2014-06-04 ENCOUNTER — Encounter: Payer: BLUE CROSS/BLUE SHIELD | Attending: Physical Medicine & Rehabilitation

## 2014-06-04 VITALS — BP 148/72 | HR 78 | Resp 14

## 2014-06-04 DIAGNOSIS — I69351 Hemiplegia and hemiparesis following cerebral infarction affecting right dominant side: Secondary | ICD-10-CM | POA: Insufficient documentation

## 2014-06-04 DIAGNOSIS — S43001D Unspecified subluxation of right shoulder joint, subsequent encounter: Secondary | ICD-10-CM

## 2014-06-04 DIAGNOSIS — Z7982 Long term (current) use of aspirin: Secondary | ICD-10-CM | POA: Insufficient documentation

## 2014-06-04 DIAGNOSIS — G8110 Spastic hemiplegia affecting unspecified side: Secondary | ICD-10-CM

## 2014-06-04 DIAGNOSIS — M25511 Pain in right shoulder: Secondary | ICD-10-CM | POA: Diagnosis not present

## 2014-06-04 NOTE — Patient Instructions (Signed)
Botox next vist in biceps, wrist and finger flexors

## 2014-06-04 NOTE — Progress Notes (Signed)
Subjective:    Patient ID: [name], male    DOB: April 08, 1956, 59 y.o.   MRN: [id number] 59 year old RH-male with history of HTN (no meds > 1 year) who was admitted to [facility] on 08/03/015 with two day history of fluctuating RUE with speed difficulty and CT head with recent left lenticulostriate infarct. He was started on ASA for treatment and blood pressure noted to be labile and treated with hydralazine. He had worsening of symptoms with dense right hemiparesis that evening and follow up CCT with left basal ganglia infarct and no significant change. Neurology (Dr. Loretha Brasil) consulted for input and felt that patient with worsening of symptoms likely due to hypotension. MRI brain with acute nonhemorrhagic infarct left lenticular nucleus and mid corona radiata and tiny infarct anterior left frontal opercular region.  He continues on ASA for secondary stroke prevention  Admit date: 12/27/2013  Discharge date: 01/17/2014  HPI Still attends PT and OT at [facility]  Right elbow flexion during ambulation  Sublux is improved   Shoulder pain is improved,, starting to move right hand more but has tightness in the finger and thumb flexors  Pain Inventory Average Pain 4 Pain Right Now 2 My pain is intermittent and aching  In the last 24 hours, has pain interfered with the following? General activity 3 Relation with others 0 Enjoyment of life 2 What TIME of day is your pain at its worst? night Sleep (in general) Good  Pain is worse with: some activites Pain improves with: therapy/exercise and medication Relief from Meds: 7  Mobility walk without assistance how many minutes can you walk? 30 ability to climb steps?  yes do you drive?  yes  Function disabled: date disabled 12/2013  Neuro/Psych trouble walking  Prior Studies Any changes since last visit?  no  Physicians involved in your care Any changes since last visit?  no   Family History  Problem Relation Age of Onset  .  Hypertension Mother    History   Social History  . Marital Status: Married    Spouse Name: N/A    Number of Children: N/A  . Years of Education: N/A   Social History Main Topics  . Smoking status: Never Smoker   . Smokeless tobacco: None  . Alcohol Use: No  . Drug Use: No  . Sexual Activity: None   Other Topics Concern  . None   Social History Narrative   Past Surgical History  Procedure Laterality Date  . Knee arthroscopy Right    History reviewed. No pertinent past medical history. BP 148/72 mmHg  Pulse 78  Resp 14  SpO2 99%  Opioid Risk Score:   Fall Risk Score:    Review of Systems  Constitutional: Negative.   HENT: Negative.   Eyes: Negative.   Respiratory: Negative.   Cardiovascular: Negative.   Gastrointestinal: Negative.   Endocrine: Negative.   Genitourinary: Negative.   Musculoskeletal: Positive for myalgias and arthralgias.  Skin: Negative.   Allergic/Immunologic: Negative.   Neurological:       Trouble walking  Hematological: Negative.   Psychiatric/Behavioral: Negative.        Objective:   Physical Exam  Constitutional: He is oriented to person, place, and time. He appears well-developed and well-nourished.  HENT:  Head: Normocephalic and atraumatic.  Eyes: Conjunctivae are normal. Pupils are equal, round, and reactive to light.  Musculoskeletal:  Right shoulder: He exhibits decreased range of motion and deformity.  1/2 FB subluxation RIght shoulder Pain with  impingement  Neurological: He is alert and oriented to person, place, and time.  Psychiatric: He has a normal mood and affect.  Nursing note and vitals reviewed.   Trace right shoulder retraction mainly scapular, trace biceps in synergy pattern Finger flexor synergy in the right hand with poor release of grasp.Ambulates without right AFO, No toe drag or knee instability     Modified Ashworth score of 3 in the finger and thumb flexors      Assessment & Plan:  1. Right spastic  hemiplegia after left basal ganglia and corona radiata infarct  Patient has made good improvements with mobility however right upper extremity is not functional. Increased tone at the biceps during ambulation may inhibit balance. Increase finger and srist flexor tone impairs grasp release Continue outpatient PT and OT. Botox 300 units 100 Biceps 200 wrist and finger flexors No driving, poor RLE control, absent RUE fxn  2. Shoulder pain improving with therapy

## 2014-06-24 ENCOUNTER — Encounter: Payer: Self-pay | Admitting: Physical Medicine & Rehabilitation

## 2014-07-09 ENCOUNTER — Ambulatory Visit (HOSPITAL_BASED_OUTPATIENT_CLINIC_OR_DEPARTMENT_OTHER): Payer: BLUE CROSS/BLUE SHIELD | Admitting: Physical Medicine & Rehabilitation

## 2014-07-09 ENCOUNTER — Encounter: Payer: BLUE CROSS/BLUE SHIELD | Attending: Physical Medicine & Rehabilitation

## 2014-07-09 ENCOUNTER — Encounter: Payer: Self-pay | Admitting: Physical Medicine & Rehabilitation

## 2014-07-09 VITALS — BP 148/82 | HR 68 | Resp 14

## 2014-07-09 DIAGNOSIS — Z7982 Long term (current) use of aspirin: Secondary | ICD-10-CM | POA: Diagnosis not present

## 2014-07-09 DIAGNOSIS — G8110 Spastic hemiplegia affecting unspecified side: Secondary | ICD-10-CM

## 2014-07-09 DIAGNOSIS — I69351 Hemiplegia and hemiparesis following cerebral infarction affecting right dominant side: Secondary | ICD-10-CM | POA: Insufficient documentation

## 2014-07-09 DIAGNOSIS — M25511 Pain in right shoulder: Secondary | ICD-10-CM | POA: Diagnosis not present

## 2014-07-09 NOTE — Progress Notes (Signed)
Botox Injection for spasticity using needle EMG guidance  Dilution: 50 Units/ml Indication: Severe spasticity which interferes with ADL,mobility and/or  hygiene and is unresponsive to medication management and other conservative care Informed consent was obtained after describing risks and benefits of the procedure with the patient. This includes bleeding, bruising, infection, excessive weakness, or medication side effects. A REMS form is on file and signed. Needle: 50mm 2" needle electrode Number of units per muscle  Biceps100 FCR50 FCU25 FDS50 FDP50 PT 25  All injections were done after obtaining appropriate EMG activity and after negative drawback for blood. The patient tolerated the procedure well. Post procedure instructions were given. A followup appointment was made.

## 2014-07-09 NOTE — Patient Instructions (Signed)

## 2014-07-23 ENCOUNTER — Encounter
Admit: 2014-07-23 | Disposition: A | Discharge status: Home / Self Care | Payer: Self-pay | Attending: Physical Medicine & Rehabilitation | Admitting: Physical Medicine & Rehabilitation

## 2014-08-15 ENCOUNTER — Ambulatory Visit (HOSPITAL_BASED_OUTPATIENT_CLINIC_OR_DEPARTMENT_OTHER): Payer: BLUE CROSS/BLUE SHIELD | Admitting: Physical Medicine & Rehabilitation

## 2014-08-15 ENCOUNTER — Encounter: Payer: BLUE CROSS/BLUE SHIELD | Attending: Physical Medicine & Rehabilitation

## 2014-08-15 ENCOUNTER — Encounter: Payer: Self-pay | Admitting: Physical Medicine & Rehabilitation

## 2014-08-15 VITALS — BP 145/44 | HR 90 | Resp 14

## 2014-08-15 DIAGNOSIS — Z7982 Long term (current) use of aspirin: Secondary | ICD-10-CM | POA: Diagnosis not present

## 2014-08-15 DIAGNOSIS — G8110 Spastic hemiplegia affecting unspecified side: Secondary | ICD-10-CM

## 2014-08-15 DIAGNOSIS — I69351 Hemiplegia and hemiparesis following cerebral infarction affecting right dominant side: Secondary | ICD-10-CM | POA: Diagnosis not present

## 2014-08-15 DIAGNOSIS — M25511 Pain in right shoulder: Secondary | ICD-10-CM | POA: Diagnosis not present

## 2014-08-15 NOTE — Progress Notes (Signed)
   Subjective:    Patient ID: [name], male    DOB: 05/09/1956, 59 y.o.   MRN: [id number]  HPI   Pain Inventory Average Pain 1 Pain Right Now 1 My pain is intermittent and dull  In the last 24 hours, has pain interfered with the following? General activity 0 Relation with others 0 Enjoyment of life 0 What TIME of day is your pain at its worst? night Sleep (in general) Good  Pain is worse with: inactivity Pain improves with: therapy/exercise Relief from Meds: 7  Mobility how many minutes can you walk? 5  Function disabled: date disabled . I need assistance with the following:  meal prep  Neuro/Psych No problems in this area  Prior Studies Any changes since last visit?  no  Physicians involved in your care Any changes since last visit?  no   Family History  Problem Relation Age of Onset  . Hypertension Mother    History   Social History  . Marital Status: Married    Spouse Name: N/A  . Number of Children: N/A  . Years of Education: N/A   Social History Main Topics  . Smoking status: Never Smoker   . Smokeless tobacco: Not on file  . Alcohol Use: No  . Drug Use: No  . Sexual Activity: Not on file   Other Topics Concern  . None   Social History Narrative   Past Surgical History  Procedure Laterality Date  . Knee arthroscopy Right    History reviewed. No pertinent past medical history. BP 145/44 mmHg  Pulse 90  Resp 14  SpO2 100%  Opioid Risk Score:   Fall Risk Score: Low Fall Risk (0-5 points) (patient previously educated)`1  Depression screen PHQ 2/9  No flowsheet data found.   Review of Systems     Objective:   Physical Exam        Assessment & Plan:

## 2014-08-15 NOTE — Progress Notes (Signed)
Subjective:     Patient ID: [name], male   DOB: 07/13/1955, 59 y.o.   MRN: [id number]  HPI Mr. Levada SchillingSummers is a 59 y/o M with a hx of HTN and dyslipidemia s/p CVA in 01/2014. He has residual spastic hemiplegia of the right side. He recently completed PT for his right leg, and has been completing PT for his right arm at [facility]. He reports that the botox injection at the last visit, as well as his PT, have helped loosen up his right arm. He reports no pain,he has not had any pain medication in 3 months. He is sleeping well. He is able to work outside in the yard and help with household chores. He returned to driving 2 weeks ago as he had no other form of transportation to get to his PT sessions. He drove himself to his office visit today. He denies depression.  Review of Systems Negative for depressed mood.  Objective:  Physical Exam  Constitutional: He appears well-developed and well-nourished.  HENT:  Head: Normocephalic and atraumatic.  Psychiatric: He has a normal mood and affect.  Right upper extremity to minus at the deltoid, biceps, triceps, grip, finger flexors and extensors Increased flexor tone in the finger flexors and wrist flexors of the right hand. Increased elbow flexion tone. Modified Ashworth score of 3 in each of these muscle groups.  Right Lower extremity strength is 3 at the hip flexor and knee extensor, 2 at the ankle dorsal flexion plantar flexor Left-sided strength is 5/5 in both upper and lower limbs  No evidence of Field cut or neglect on confrontation testing  Assessment:  1. Right Spastic Hemiplegia with hx of Left CVA: Currently working with OT to strengthen right UE.  Plan:  1. Right Spastic Hemiplegia with hx of Left CVA: Continue working with OT to strengthen right UE. Plan to do another Botox injection of right arm at next visit. Can resume driving, avoid highway driving and night driving.  Botox dose 400Units Plan Biceps 200 Unit Forearm  200Unit

## 2014-08-23 ENCOUNTER — Encounter
Admit: 2014-08-23 | Disposition: A | Discharge status: Home / Self Care | Payer: Self-pay | Attending: Physical Medicine & Rehabilitation | Admitting: Physical Medicine & Rehabilitation

## 2014-09-14 NOTE — Discharge Summary (Signed)
PATIENT NAME:  [name], [name] MR#:  [id number] DATE OF BIRTH:  04-12-1956  DATE OF ADMISSION:  12/24/2013 DATE OF DISCHARGE:  12/27/2013  ADDENDUM  The patient is being discharged to acute inpatient rehab at [facility]. For a detailed note, please take a look at the history and physical done on admission by Dr. Sheryle Hailiamond. Please also look at the discharge summary done by Dr. Clent RidgesWalsh which covers the extensive part of the hospital course. This is just a quick addendum since December 26, 2013.   DISCHARGE DIAGNOSES:  1.  Acute cerebrovascular accident with dense right-sided hemiparesis.  2.  Hypertension.  3.  Hyperlipidemia.   HOSPITAL COURSE: The patient was admitted to the hospital for work-up of an acute stroke. He was diagnosed to have an acute left lenticular stroke which resulted in a dense right hemiparesis. The patient continues to have significant weakness on the right side. He is currently on aspirin and statin, which he will continue. He was seen by physical therapy and occupational therapy and they thought he would benefit from acute inpatient rehab. He is therefore being discharged to [facility] for that. He is presently hemodynamically stable and has no other acute symptoms presently. The patient and family are in agreement with this plan and he is being discharged to acute inpatient rehab at [facility].   TIME SPENT: 35 minutes.  ____________________________ Rolly PancakeVivek J. Cherlynn KaiserSainani, MD vjs:sb D: 12/27/2013 09:34:33 ET T: 12/27/2013 09:51:48 ET JOB#: [id number]  cc: Rolly PancakeVivek J. Cherlynn KaiserSainani, MD, <Dictator> Houston SirenVIVEK J SAINANI MD ELECTRONICALLY SIGNED 01/04/2014 14:28

## 2014-09-14 NOTE — Consult Note (Signed)
PATIENT NAME:  [name], [name] MR#:  [id number] DATE OF BIRTH:  January 15, 1956  DATE OF CONSULTATION:  12/24/2013  REFERRING PHYSICIAN:   CONSULTING PHYSICIAN:  Pauletta BrownsYuriy Leshon Armistead, MD  REASON FOR CONSULTATION 59 year old gentleman with past medical history of hypertension, presenting with 2 days history of right upper extremity weakness. The patient states that he was at home and had an onset right upper and right lower extremity weakness, along with dysarthria. Did not come into the hospital for 2 days, did not want to come to the hospital, and was forced by the family. Patient feels that his right upper extremity is stronger, but it is not nearly at baseline. No visual changes. Right lower extremity is close to baseline at this point. No prior history of similar events in the past.   REVIEW OF SYSTEMS: He denies any fever, denies any weight loss, denies any visual change; denies any shortness of breath, denies any chest pain, denies any palpitations. Positive weakness in the right upper extremity compared to the left upper extremity. No history of anxiety or depression. No hematuria. No urinary incontinence. No heat or cold intolerance.   PAST MEDICAL HISTORY: Diet controlled high blood pressure, recently discontinued his metoprolol.   MEDICATIONS: The patient states he occasionally takes aspirin 81 mg at home.   SOCIAL HISTORY: Does not smoke. No EtOH use.   IMAGING: Patient is status post CT of the head that shows infarct and lenticulostriate distribution on his left small punctate infarct.   PHYSICAL EXAMINATION: VITAL SIGNS: Include a temperature of 98.2, blood pressure 178/108.  NEUROLOGIC: Extraocular movements are intact, facial sensation intact, facial motor is intact. Speech appears to be dysarthric. He appears to have right facial droop. Tongue is midline. The patient states that he can move his right upper extremity, but upon further physical examination there is a lot of giveaway weakness.  The patient has 4 to 4- out of 5, in the proximal and distal distribution of the right upper extremity; bilaterally lower extremities are 5 out of 5, left upper extremity is 4 out of 5, as well. Sensation is intact to light touch and temperature bilaterally; reflexes 2+ symmetrical bilaterally; coordination finger-to-nose intact; gait could not be assessed.   IMPRESSION: A 59 year old African American male with 2 day history of right upper extremity weakness, found to have a small punctate left putaminal stroke with a questionable history on and off, aspirin 81 mg use but unsure if he was taken it consistently, currently on aspirin 325, and Zocor.   PLAN: Physical therapy, occupational therapy, 2D echocardiogram, and carotid Dopplers.   Discharge planning in a day or 2.   I suspect patient's symptoms will improve as his right upper extremity has significantly improved.   Thank you for the pleasure of seeing this patient.   Please call with any questions.    ____________________________ Pauletta BrownsYuriy Jaelani Posa, MD yz:nt D: 12/24/2013 14:53:22 ET T: 12/24/2013 15:33:27 ET JOB#: [id number]  cc: Pauletta BrownsYuriy Tiasia Weberg, MD, <Dictator> Pauletta BrownsYURIY Denise Washburn MD ELECTRONICALLY SIGNED 12/25/2013 21:17

## 2014-09-14 NOTE — H&P (Signed)
PATIENT NAME:  [name] MR#:  [id number] DATE OF BIRTH:  [birth year]  DATE OF ADMISSION:  12/24/2013  REFERRING PHYSICIAN: Dr. Dolores Frame.  PRIMARY CARE DOCTOR: None.  ADMISSION DIAGNOSES: Cerebrovascular accident, right upper extremity weakness, hypertension.   HISTORY OF PRESENT ILLNESS: This is a 59 year old, African American male, who presents to the Emergency Department for right upper extremity weakness. The symptoms began 2 days prior to admission. He states that after the first 12 to 18 hours, his right arm became somewhat stronger, but that in the following 12 hours, he noticed that he was having some difficulty speaking and perhaps some difficulty swallowing saliva. He denies any choking or falls. He admits, though, that his right leg is also somewhat weak. The patient states that he is under a lot of stress lately and that he has been working approximately 16 days straight. He denies any pain at this time. He has had no recent illnesses or injuries.   REVIEW OF SYSTEMS:  CONSTITUTIONAL: The patient denies fever or weight loss.  ENT: The patient denies decrease in visual acuity or nosebleed. He admits to some difficulty with his saliva.  CARDIOVASCULAR: The patient denies chest pain or palpitations.  LUNGS: The patient denies cough, wheezing or shortness of breath.  GASTROINTESTINAL: The patient denies nausea, vomiting, diarrhea, or abdominal pain.  GENITOURINARY: The patient denies dysuria, increased frequency or hesitancy.  MUSCULOSKELETAL: The patient admits to right-sided weakness. He denies any arthralgias or myalgias. NEUROLOGIC: The patient admits to weakness. He denies paresthesias. PSYCHIATRIC: The patient denies suicidal ideation or homicidal ideation.   PAST MEDICAL HISTORY: None.  PAST SURGICAL HISTORY: Arthroscopic cleanout of the right knee many years ago.   SOCIAL HISTORY: The patient works 2 jobs. He does not smoke, drink or do any drugs. He frequently rides his  bicycle approximately 20 miles about 3 times per week.   MEDICATIONS: None.   ALLERGIES: No known drug allergies.   PERTINENT LABORATORY RESULTS AND RADIOLOGIC FINDINGS: The patient's INR is 1. Potassium is 3.2.   CT of the brain shows an infarct in the lenticulostriate distribution, specifically in the left putamen.   PHYSICAL EXAMINATION: VITAL SIGNS: Temperature 98.5, pulse 101, respirations 18. Blood pressure initially 236/116, now 184/115 after hydralazine and labetalol.  GENERAL: The patient is alert and oriented x 3, in no apparent distress. HEENT: Normocephalic, atraumatic. PERRLA, EOMI. Moist mucous membranes.  NECK: Trachea is midline. No adenopathy.  CHEST: Symmetric, atraumatic. CARDIOVASCULAR: Regular rate and rhythm. Normal S1, S2 no rubs, clicks, or murmurs. No carotid bruits.  LUNGS: Clear to auscultation bilaterally. Normal effort and excursion.  ABDOMEN: Positive bowel sounds, soft, nontender, nondistended. No hepatosplenomegaly.  GENITOURINARY: Deferred.  MUSCULOSKELETAL: The patient has 5 out of 5 strength in the left upper and lower extremities. Right upper extremity, strength 4 out of 5, as well as the right lower extremity.  SKIN: No rashes or lesions.  EXTREMITIES: No clubbing, cyanosis, or edema.  NEUROLOGIC: There is some weakness on the left side of the face. The patient has some pronator drift on the right. Reflexes are very brisk on the right, as well. Babinski sign is negative. I have not stood the patient up or walked him.  PSYCHIATRIC: Mood is normal. Affect is congruent.   ASSESSMENT AND PLAN: This is a 58 year old male with a recent stroke to the left internal capsule.   1. Cerebrovascular accident. No hemorrhage seen on CT scan. Aspirin was started in the Emergency Department. We will begin a statin. Neurology  consult has been ordered. Due to timing of the infarct, we can go ahead and order an MRI at this time.  2. Hypertension. The patient has been  given some doses of hydralazine in the Emergency Department. I have ordered labetalol p.r.n. for systolic blood pressure more than 180. For management of his blood pressure, we will begin the patient on oral dual antihypertensive therapy of lisinopril with hydrochlorothiazide. 3. Dysphasia. The patient apparently passed a bedside swallow evaluation, but we will have him seen by speech therapy. He is n.p.o. right now.  4. Right-sided weakness. Physical therapy and occupational therapy evaluations ordered.  5. Deep vein thrombosis prophylaxis, sequential compression devices.  6. Gastrointestinal prophylaxis not necessary as the patient is not critically ill.   CODE STATUS: The patient is a Full Code.   TIME SPENT ON ADMISSION ORDERS AND PATIENT CARE: Approximately 35 minutes.    ____________________________ Kelton PillarMichael S. Sheryle Hailiamond, MD msd:jr D: 12/24/2013 08:56:31 ET T: 12/24/2013 09:14:30 ET JOB#: [id number]  cc: Kelton PillarMichael S. Sheryle Hailiamond, MD, <Dictator> Kelton PillarMICHAEL S Tycho Cheramie MD ELECTRONICALLY SIGNED 12/24/2013 23:51

## 2014-09-14 NOTE — Discharge Summary (Signed)
PATIENT NAME:  [name], [name] MR#:  [id number] DATE OF BIRTH:  May 15, 1956  DATE OF ADMISSION:  12/24/2013 DATE OF DISCHARGE:  12/26/2013  DISCHARGE DIAGNOSES: 1.  Cerebrovascular accident with ischemic infarction of the left lentiform nucleus and corona radiata and tiny ischemic infarction of the left frontal operculum.  2.  Right-sided hemiparesis.  3.  Hypertension.  4.  Hyperlipidemia.  5.  Moderate left ventricular hypertrophy seen and 2D echocardiogram. 6.  Mild diastolic dysfunction demonstrated on 2D echocardiogram August 4, with preserved ejection fraction of 60% to 65% .     CONSULTATIONS:  Pauletta BrownsYuriy Zeylikman, MD, neurology.   PROCEDURES: 1.  Noncontrast CT of the head August 3. Shows recent perforator infarct in the left lateral lenticulostriate distribution.  2.  CTA of the head August 4, again shows acute infarct of the left lentiform nucleus and corona radiata.  Proximal left M2 superior division occlusion with collateral flow. 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 Mild to moderate bilateral P2 stenosis.  3.  MRI with and without contrast of the brain, August 4, shows acute left lentiform nucleus and corona radiata infarction. Tiny acute left frontal operculum  infarction. Mild small vessel disease. Possible narrowing of the left carotid terminus and possible narrowing of the M1 branch of the left MCA.  4.  Carotid Dopplers 12/24/2013, shows no hemodynamically significant stenosis on the left or right. Mild to moderate atherosclerotic plaque formation in both left and right carotid arteries.  5.  A 2D echocardiogram August 3, shows ejection fraction of 60% to 65%. Moderate left ventricular hypertrophy. Mild left atrium dilation. Mild diastolic dysfunction.   HISTORY OF PRESENT ILLNESS: This 59 year old gentleman with past medical history of hypertension presents 2 days after onset of right upper extremity weakness. The patient lives at home and had onset of right  upper and right lower extremity weakness along with dysarthria. He did not come into the hospital for 2 days after symptoms, as he did not want to be hospitalized but was forced by his family to come. At the time of admission, he feels that his right upper extremity is stronger than at the onset of symptoms, but is not at baseline. He has had no visual changes. Right lower extremity is close to baseline at the time of admission. He is admitted for workup of stroke.   HOSPITAL COURSE BY PROBLEM:  1.  Cerebrovascular accident:  Nonhemorrhagic infarction of the left lentiform nucleus and corona radiata seen on CT and MRI. Aspirin was started at full dose in the Emergency Department. Statin therapy was initiated at the time of admission. During the admission, he had an unexpected hypotensive response to hydralazine, which caused worsening of CVA symptoms. At the time of discharge, he has a dense right-sided hemiparesis. He is being discharged to inpatient rehabilitation at [facility].  2.  Right-sided hemiparesis:  As a result of cerebrovascular accident, he being discharged to inpatient rehabilitation in hopes of regaining function.  3.  Hypertension:  On presentation, the patient's blood pressure was 184/115. He received multiple doses of hydralazine in the Emergency Department with little effect. He received multiple doses of p.r.n. labetalol once admitted to the floor with no effect. His blood pressure persisted in the 190s over 100s throughout the 1st day of admission, until he had 1 dose of hydralazine in the afternoon which caused his blood pressure to decrease to 80/60. This was immediately addressed with boluses of IV fluids and cessation of all antihypertensives. At the time  of discharge, his blood pressure is in the 150/90 range. He is not on any antihypertensive medications. This patient should not receive hydralazine. The goal for systolic blood pressure in this post stroke patient is 160-180.  4.   Hyperlipidemia: LDL 147. Statin therapy initiated during this admission.  5.  Left ventricular hypertrophy as noted on 2D echocardiogram August 3:  Ejection fraction is preserved at 60% to 65%. Hypertrophy, most likely due to long-standing hypertension. In the future, if he is hypertensive and ACE inhibitor would be beneficial.  6.  Diastolic dysfunction as seen on 2D echocardiogram August 3:  Preserved ejection fraction. There was no sign of heart failure during this admission. As mentioned above, if blood pressure allows ACE inhibitor, would be beneficial in the future.   DISCHARGE MEDICATIONS:  1.  Aspirin 325 daily.  2.  Simvastatin 20 mg daily.   PHYSICAL EXAMINATION: VITAL SIGNS:  Temperature 98.2, pulse 80, respirations 18, blood pressure 151/98, oxygenation 97% on room air.  GENERAL: The patient is alert, oriented, in no acute distress.   HEENT: Pupils are equal, round, and reactive.  Conjunctivae are clear. Extraocular motion is intact. Mucous membranes are pink and moist, and the patient is edentulous. Trachea is midline.  CARDIOVASCULAR: Regular rate and rhythm, no murmurs, rubs, or gallops, and peripheral pulses are 2+.  PULMONARY: Lungs are clear to auscultation bilaterally with good air movement.  ABDOMEN: Soft, nontender, bowel sounds are normal.  EXTREMITIES: There is no edema, no cyanosis or clubbing.  NEUROLOGIC: Extraocular motion is intact. The patient is alert and oriented. Speech is slightly dysarthric.  He  has a right-sided facial droop.  He has right upper and lower extremity weakness with strength of 0/5. Left upper and lower extremity strength is 5/5. Sensation is diminished on the right side.  LABORATORY DATA:  Sodium 138, potassium 4.1, chloride 104, bicarbonate 29, BUN 14, creatinine 1.2, glucose 139, total cholesterol 208, LDL 147, HDL 49, triglycerides 59. LFTs are normal on admission. Cardiac enzymes are negative. TSH is 2.5. Urine drug screen is negative. CBC is  normal with hemoglobin of 15.3. White blood cell count of 6.2. Urinalysis was normal.   DISCHARGE PLAN: The patient is discharged to [facility] inpatient rehabilitation for intensive physical therapy in hopes of regaining function.   Time spend on discharge 40 minutes   ____________________________ Ena Dawley. Clent Ridges, MD cpw:ds D: 12/26/2013 20:40:26 ET T: 12/26/2013 21:29:37 ET JOB#: [id number]  cc: Ena Dawley. Clent Ridges, MD, <Dictator> Gale Journey MD ELECTRONICALLY SIGNED 12/28/2013 11:18

## 2014-09-23 ENCOUNTER — Encounter: Payer: Self-pay | Admitting: Physical Medicine & Rehabilitation

## 2014-09-23 ENCOUNTER — Encounter: Payer: BLUE CROSS/BLUE SHIELD | Attending: Physical Medicine & Rehabilitation

## 2014-09-23 ENCOUNTER — Ambulatory Visit (HOSPITAL_BASED_OUTPATIENT_CLINIC_OR_DEPARTMENT_OTHER): Payer: BLUE CROSS/BLUE SHIELD | Admitting: Physical Medicine & Rehabilitation

## 2014-09-23 VITALS — BP 138/84 | HR 86 | Resp 14

## 2014-09-23 DIAGNOSIS — G8110 Spastic hemiplegia affecting unspecified side: Secondary | ICD-10-CM

## 2014-09-23 DIAGNOSIS — Z7982 Long term (current) use of aspirin: Secondary | ICD-10-CM | POA: Insufficient documentation

## 2014-09-23 DIAGNOSIS — I69351 Hemiplegia and hemiparesis following cerebral infarction affecting right dominant side: Secondary | ICD-10-CM | POA: Insufficient documentation

## 2014-09-23 DIAGNOSIS — M25511 Pain in right shoulder: Secondary | ICD-10-CM | POA: Insufficient documentation

## 2014-09-23 NOTE — Progress Notes (Signed)
Botox Injection for spasticity using needle EMG guidance  Dilution: 50 Units/ml Indication: Severe spasticity which interferes with ADL,mobility and/or  hygiene and is unresponsive to medication management and other conservative care Informed consent was obtained after describing risks and benefits of the procedure with the patient. This includes bleeding, bruising, infection, excessive weakness, or medication side effects. A REMS form is on file and signed. Needle: 25g 2" needle electrode Number of units per muscle Biceps200 FCR50 FCU25 FDS50 FDP50 PT 25 All injections were done after obtaining appropriate EMG activity and after negative drawback for blood. The patient tolerated the procedure well. Post procedure instructions were given. A followup appointment was made.

## 2014-09-23 NOTE — Patient Instructions (Signed)

## 2014-09-25 ENCOUNTER — Ambulatory Visit: Payer: BLUE CROSS/BLUE SHIELD | Admitting: Occupational Therapy

## 2014-09-25 ENCOUNTER — Encounter: Payer: Self-pay | Admitting: Occupational Therapy

## 2014-09-25 DIAGNOSIS — I69398 Other sequelae of cerebral infarction: Secondary | ICD-10-CM | POA: Insufficient documentation

## 2014-09-25 DIAGNOSIS — R279 Unspecified lack of coordination: Secondary | ICD-10-CM | POA: Diagnosis not present

## 2014-09-25 DIAGNOSIS — G8191 Hemiplegia, unspecified affecting right dominant side: Secondary | ICD-10-CM

## 2014-09-25 DIAGNOSIS — IMO0002 Reserved for concepts with insufficient information to code with codable children: Secondary | ICD-10-CM

## 2014-09-25 DIAGNOSIS — M6281 Muscle weakness (generalized): Secondary | ICD-10-CM

## 2014-09-25 NOTE — Patient Instructions (Signed)
Patient instructed on exercises for RUE with focus on forearm supination and wrist extension.

## 2014-09-26 NOTE — Therapy (Signed)
[place] MAIN [facility] [address] [place], [place], [address] Phone: [phone]   Fax:  [phone]  Occupational Therapy Treatment  Patient Details  Name: [name] MRN: [id number] Date of Birth: May 27, 1955 Referring Provider:  No ref. provider found  Encounter Date: 09/25/2014      OT End of Session - 09/25/14 1650    Visit Number [id number]   Number of Visits 85   Date for OT Re-Evaluation 12/14/14   OT Start Time 1100   OT Stop Time 1146   OT Time Calculation (min) 46 min   Activity Tolerance Patient tolerated treatment well;No increased pain   Behavior During Therapy [facility] for tasks assessed/performed      Past Medical History  Diagnosis Date  . Stroke     Past Surgical History  Procedure Laterality Date  . Knee arthroscopy Right     There were no vitals filed for this visit.  Visit Diagnosis:  Lack of coordination due to stroke  Muscle weakness  Hemiplegia affecting right dominant side      Subjective Assessment - 09/25/14 1642    Subjective  Patient reports his doctors appt went well, he received his Botox injections on Monday.  He felt a little sore and slept a lot for the evening.   Pertinent History Botox injections in right UE on Monday 09-23-14   Patient Stated Goals Patient wants to be independent with all self care and IADL tasks.   Currently in Pain? No/denies                      OT Treatments/Exercises (OP) - 09/26/14 0001    ADLs   UB Dressing Repeated motions with right arm towards left arm pit for deodorant application.  Reaching with right hand to pants to loop and attempt to pull up.     Neurological Re-education Exercises   Other Exercises 1 Patient seen for RUE ROM and facilitation of movement to utilize in ADL tasks as an assist at this point. Supine AAROM for shoulder flexion for place and hold for 10 secs with therapist assist to guide arm and assist with placement. Cues for  patient to control the arm with decent from flexion. Shoulder ABD/ADD, external rotation, forearm supination, elbow flexion/extension. ADD to reach across body to opposite shoulder and opposite hip with cues and facilitation at the elbow. Sitting, patient seen for forward flexion with reach between legs towards floor to pick up item using lateral pinch on the right.    Other Exercises 2 Focused on supination of forearm and 6 general wrist stretches towards wrist extension.                  OT Education - 09/25/14 1649    Education provided Yes   Education Details Re instruction on exercise program for home use   Person(s) Educated Patient   Methods Explanation;Demonstration;Verbal cues   Comprehension Verbalized understanding;Verbal cues required;Returned demonstration             OT Long Term Goals - 09/25/14 1659    OT LONG TERM GOAL #1   Title Patient will improve R UE function to be able to complete bilateral ADL   Baseline Patient is using RUE as a gross assist more in the last month, still has difficulty with bilateral tasks.   Time 12   Period Weeks   Status On-going   OT LONG TERM GOAL #2   Title Patient will  be able to utilize RUE as an assist with washing the hood of his car    Baseline Patient using RUE hand over hand for task but lacks wrist extension which limits task.   Time 12   Period Weeks   Status On-going   OT LONG TERM GOAL #3   Title Patient will demo right shoulder ABD sufficient to apply deodorant independently   Baseline Patient able to perform with minimal ABD but is working towards greater ROM to perform without difficulty.   Time 12   Period Weeks   Status Partially Met   OT LONG TERM GOAL #4   Title will improve supination of right hand by 20 degrees to help stabilize hand on rake for yardwork   Baseline Patient is now having to place hand on rake in a pronated position.   Time 12   Period Weeks   Status On-going   OT LONG TERM GOAL #5    Title Patient will improve right grip sufficient to stabilize garden tool in his hand to dig dirt for flowers    Baseline Beginning to show signs of weak grasp on right but unable to hold with enough strength to dig dirt.   Time 12   Period Weeks   Status On-going   Long Term Additional Goals   Additional Long Term Goals Yes   OT LONG TERM GOAL #6   Title Patient will complete folding of laundry using right hand as an assist during task    Baseline Patient continues to require assistance with task but is able to stabilize select pieces of laundry.   Time 12   Period Weeks   Status On-going               Plan - 09/25/14 1656    Clinical Impression Statement Patient continues to progress with RUE ROM and functional use.  He went to the doctor and reports MD is pleased with progress and wants patient to continue with OT especially now after receiving Botox injections.  Received 18 Botox injections this week in the RUE and will benefit from continued skilled OT to maximize safety and independence in daily tasks.     Pt will benefit from skilled therapeutic intervention in order to improve on the following deficits (Retired) Decreased coordination;Decreased range of motion;Impaired flexibility;Impaired tone;Impaired UE functional use;Decreased strength   Rehab Potential Good   OT Frequency 2x / week   OT Duration 12 weeks   OT Treatment/Interventions Self-care/ADL training;Therapeutic exercise;Patient/family education;Neuromuscular education;Manual Therapy;Therapeutic exercises;Electrical Stimulation;Passive range of motion   OT Home Exercise Plan Patient has established HEP from previous sessions and will continue to add to plan   Consulted and Agree with Plan of Care Patient        Problem List Patient Active Problem List   Diagnosis Date Noted  . Shoulder subluxation, right 04/23/2014  . Spastic hemiplegia affecting dominant side 02/18/2014  . HTN (hypertension) 01/17/2014   . Dyslipidemia 01/17/2014  . CVA (cerebral infarction) 12/27/2013    Amy T Tomasita Morrow, OTR/L, CLT  09/26/2014, 4:11 PM  [place] MAIN [facility] SERVICES [address] [place], [place], [address] Phone: [phone]   Fax:  [phone]

## 2014-09-30 ENCOUNTER — Ambulatory Visit: Payer: BLUE CROSS/BLUE SHIELD | Admitting: Occupational Therapy

## 2014-09-30 DIAGNOSIS — I69398 Other sequelae of cerebral infarction: Secondary | ICD-10-CM | POA: Diagnosis not present

## 2014-09-30 DIAGNOSIS — G8191 Hemiplegia, unspecified affecting right dominant side: Secondary | ICD-10-CM

## 2014-09-30 DIAGNOSIS — IMO0002 Reserved for concepts with insufficient information to code with codable children: Secondary | ICD-10-CM

## 2014-09-30 DIAGNOSIS — M6281 Muscle weakness (generalized): Secondary | ICD-10-CM

## 2014-10-01 ENCOUNTER — Encounter: Payer: Self-pay | Admitting: Occupational Therapy

## 2014-10-01 NOTE — Therapy (Signed)
[place] MAIN [facility] SERVICES [address] [place], [place], [address] Phone: [phone]   Fax:  [phone]  Occupational Therapy Treatment  Patient Details  Name: [name] MRN: [id number] Date of Birth: June 10, 1955 Referring Provider:  No ref. provider found  Encounter Date: 09/30/2014      OT End of Session - 09/30/14 1146    Visit Number 56   Number of Visits 31   Date for OT Re-Evaluation 12/14/14   OT Start Time 1100   OT Stop Time 1145   OT Time Calculation (min) 45 min   Behavior During Therapy [facility] for tasks assessed/performed      Past Medical History  Diagnosis Date  . Stroke     Past Surgical History  Procedure Laterality Date  . Knee arthroscopy Right     There were no vitals filed for this visit.  Visit Diagnosis:  Lack of coordination due to stroke  Muscle weakness  Hemiplegia affecting right dominant side      Subjective Assessment - 10/01/14 1005    Subjective  Patient reports he has been working some around the house, had a big dinner for Mother's Day.  Reports he noticed a few changes in his right arm over the weekend but feels his fingers were a little tighter and one spot of pain/discomfort with the shoulder where he received the injection.   Pertinent History Botox injections in right UE on Monday 09-23-14   Patient Stated Goals Patient wants to be independent with all self care and IADL tasks.   Currently in Pain? Yes   Pain Score 2    Pain Location Shoulder   Pain Orientation Right   Pain Descriptors / Indicators Discomfort   Pain Type Acute pain   Pain Onset In the past 7 days   Pain Frequency Occasional   Multiple Pain Sites No                      OT Treatments/Exercises (OP) - 10/01/14 0001    Neurological Re-education Exercises   Other Exercises 1 Patient seen for RUE ROM and facilitation of movement to utilize right arm into daily tasks at home and with yard work.  SupinePROM followed by  [facility] for shoulder flexion for place and hold for 10 secs with therapist assist to guide arm and assist with placement. Cues for patient to control the arm with decent from flexion. Shoulder ABD/ADD, external rotation, forearm supination, elbow flexion/extension. ADD to reach across body to opposite shoulder and opposite hip with cues and facilitation at the elbow.  Dowel exercises in supine for shoulder flexion, chest press, ABD/ADD, forwards and backwards circles with cues and occasional guiding.  Sitting, patient seen for forward flexion with reach between legs towards floor to pick up item using lateral pinch on the right.  Advanced to smaller item this date with hook loop and patient had difficulty reaching all the way to the floor to obtain item.     Weight Bearing Position Seated   Other Weight-Bearing Exercises 1 Attempts at weight bearing position with outstretched arm on the right however, wrist extension still limited and unable to fully achieve this weight bearing position                OT Education - 10/01/14 1020    Education provided Yes   Education Details Encouraged patient to perform passive stretches to fingers out of flexion over the next few days.  Person(s) Educated Patient   Methods Explanation;Demonstration   Comprehension Verbalized understanding             OT Long Term Goals - 09/25/14 1659    OT LONG TERM GOAL #1   Title Patient will improve R UE function to be able to complete bilateral ADL   Baseline Patient is using RUE as a gross assist more in the last month, still has difficulty with bilateral tasks.   Time 12   Period Weeks   Status On-going   OT LONG TERM GOAL #2   Title Patient will be able to utilize RUE as an assist with washing the hood of his car    Baseline Patient using RUE hand over hand for task but lacks wrist extension which limits task.   Time 12   Period Weeks   Status On-going   OT LONG TERM GOAL #3    Title Patient will demo right shoulder ABD sufficient to apply deodorant independently   Baseline Patient able to perform with minimal ABD but is working towards greater ROM to perform without difficulty.   Time 12   Period Weeks   Status Partially Met   OT LONG TERM GOAL #4   Title will improve supination of right hand by 20 degrees to help stabilize hand on rake for yardwork   Baseline Patient is now having to place hand on rake in a pronated position.   Time 12   Period Weeks   Status On-going   OT LONG TERM GOAL #5   Title Patient will improve right grip sufficient to stabilize garden tool in his hand to dig dirt for flowers    Baseline Beginning to show signs of weak grasp on right but unable to hold with enough strength to dig dirt.   Time 12   Period Weeks   Status On-going   Long Term Additional Goals   Additional Long Term Goals Yes   OT LONG TERM GOAL #6   Title Patient will complete folding of laundry using right hand as an assist during task    Baseline Patient continues to require assistance with task but is able to stabilize select pieces of laundry.   Time 12   Period Weeks   Status On-going               Plan - 10/01/14 1023    Clinical Impression Statement Patient received Botox injection this past week and still feels somewhat sore at the right shoulder from one injection site.  Otherwise he does not complain of any pain with ROM during treatment session.  He reported some increased flexion of the fingers this date and was encouraged to perform more finger extension and PROM of fingers to avoid flexion positions.  Patient shoulder flexion to 135 this date in supine.  Difficulty with lateral pinch and reaching this date.  Will continue to focus on RUE retraining and focus on supination, wrist extension and digit extension for use in weight bearing and active tasks.    Pt will benefit from skilled therapeutic intervention in order to improve on the following  deficits (Retired) Decreased coordination;Decreased range of motion;Impaired flexibility;Impaired tone;Impaired UE functional use;Pain;Decreased strength   Rehab Potential Good   Clinical Impairments Affecting Rehab Potential increased spasticity of the right side.   OT Frequency 2x / week   OT Duration 12 weeks   OT Treatment/Interventions Self-care/ADL training;Therapeutic exercise;Patient/family education;Neuromuscular education;Manual Therapy;Therapeutic exercises;Electrical Stimulation;Passive range of motion   OT Home Exercise   Plan Patient has established HEP from previous sessions and will continue to add to plan   Consulted and Agree with Plan of Care Patient        Problem List Patient Active Problem List   Diagnosis Date Noted  . Shoulder subluxation, right 04/23/2014  . Spastic hemiplegia affecting dominant side 02/18/2014  . HTN (hypertension) 01/17/2014  . Dyslipidemia 01/17/2014  . CVA (cerebral infarction) 12/27/2013   Amy T Lovett, OTR/L, CLT  10/01/2014, 10:29 AM  [place] [facility] MAIN REH[facility] SERVICES [address] [place], [place], [address] Phone: [phone]   Fax:  [phone]    

## 2014-10-02 ENCOUNTER — Ambulatory Visit: Payer: BLUE CROSS/BLUE SHIELD | Attending: Physical Medicine & Rehabilitation | Admitting: Occupational Therapy

## 2014-10-02 DIAGNOSIS — IMO0002 Reserved for concepts with insufficient information to code with codable children: Secondary | ICD-10-CM

## 2014-10-02 DIAGNOSIS — I69398 Other sequelae of cerebral infarction: Secondary | ICD-10-CM | POA: Diagnosis not present

## 2014-10-02 DIAGNOSIS — M6281 Muscle weakness (generalized): Secondary | ICD-10-CM

## 2014-10-02 DIAGNOSIS — G8191 Hemiplegia, unspecified affecting right dominant side: Secondary | ICD-10-CM

## 2014-10-03 ENCOUNTER — Encounter: Payer: Self-pay | Admitting: Occupational Therapy

## 2014-10-03 NOTE — Therapy (Signed)
[place] MAIN [facility] SERVICES [address] [place], [place], [address] Phone: [phone]   Fax:  [phone]  Occupational Therapy Treatment  Patient Details  Name: [name] MRN: [id number] Date of Birth: 07/25/1955 Referring Provider:  No ref. provider found  Encounter Date: 10/02/2014      OT End of Session - 10/02/14 1119    Visit Number 62   Number of Visits 85   Date for OT Re-Evaluation 12/14/14   OT Start Time 1103   OT Stop Time 1153   OT Time Calculation (min) 50 min   Activity Tolerance Patient tolerated treatment well;No increased pain   Behavior During Therapy [facility] for tasks assessed/performed      Past Medical History  Diagnosis Date  . Stroke     Past Surgical History  Procedure Laterality Date  . Knee arthroscopy Right     There were no vitals filed for this visit.  Visit Diagnosis:  Lack of coordination due to stroke  Muscle weakness  Hemiplegia affecting right dominant side      Subjective Assessment - 10/03/14 1116    Subjective  Patient reports he is doing well at home, been busy around the house cleaning and doing some yard work.   Pertinent History Botox injections in right UE on Monday 09-23-14   Patient Stated Goals Patient wants to be independent with all self care and IADL tasks.   Currently in Pain? No/denies   Pain Score 0-No pain   Multiple Pain Sites No                      OT Treatments/Exercises (OP) - 10/03/14 0001    ADLs   Home Maintenance Patient seen this date for grasp of broom handle with right hand in a pronated position to utilize a two handed use to complete sweeping tasks.  He required prep work for stretching, guiding from therapist and verbal cues.  Instructed patient on the same grasp to operate the rake at home.  Patient also able to hold dustpan but tends to tilt the pan down with decreased wrist extension and control.  Instructed on potential use of long  handled dustpan as well as modified use of regular dust pan.     Neurological Re-education Exercises   Other Exercises 1 Patient seen for RUE ROM and facilitation of movement to utilize in ADL tasks as an assist at this point. Supine AAROM for shoulder flexion for place and hold for 10 secs with therapist assist to guide arm and assist with placement. Cues for patient to control the arm with decent from flexion. Shoulder ABD/ADD, external rotation, forearm supination, elbow flexion/extension. ADD to reach across body to opposite shoulder and opposite hip with cues and facilitation at the elbow.                 OT Education - 10/02/14 1118    Education provided Yes   Education Details Patient to continue working with grasp on rake and broom.   Person(s) Educated Patient   Methods Explanation;Demonstration;Tactile cues;Verbal cues   Comprehension Returned demonstration;Verbalized understanding;Verbal cues required;Tactile cues required;Need further instruction             OT Long Term Goals - 09/25/14 1659    OT LONG TERM GOAL #1   Title Patient will improve R UE function to be able to complete bilateral ADL   Baseline Patient is using RUE as a gross assist more in  the last month, still has difficulty with bilateral tasks.   Time 12   Period Weeks   Status On-going   OT LONG TERM GOAL #2   Title Patient will be able to utilize RUE as an assist with washing the hood of his car    Baseline Patient using RUE hand over hand for task but lacks wrist extension which limits task.   Time 12   Period Weeks   Status On-going   OT LONG TERM GOAL #3   Title Patient will demo right shoulder ABD sufficient to apply deodorant independently   Baseline Patient able to perform with minimal ABD but is working towards greater ROM to perform without difficulty.   Time 12   Period Weeks   Status Partially Met   OT LONG TERM GOAL #4   Title will improve supination of right hand by 20 degrees  to help stabilize hand on rake for yardwork   Baseline Patient is now having to place hand on rake in a pronated position.   Time 12   Period Weeks   Status On-going   OT LONG TERM GOAL #5   Title Patient will improve right grip sufficient to stabilize garden tool in his hand to dig dirt for flowers    Baseline Beginning to show signs of weak grasp on right but unable to hold with enough strength to dig dirt.   Time 12   Period Weeks   Status On-going   Long Term Additional Goals   Additional Long Term Goals Yes   OT LONG TERM GOAL #6   Title Patient will complete folding of laundry using right hand as an assist during task    Baseline Patient continues to require assistance with task but is able to stabilize select pieces of laundry.   Time 12   Period Weeks   Status On-going               Plan - 10/02/14 1150    Pt will benefit from skilled therapeutic intervention in order to improve on the following deficits (Retired) Decreased coordination;Decreased range of motion;Impaired flexibility;Impaired tone;Impaired UE functional use;Pain;Decreased strength   Rehab Potential Good   Clinical Impairments Affecting Rehab Potential increased spasticity of the right side.   OT Frequency 2x / week   OT Duration 12 weeks   OT Treatment/Interventions Self-care/ADL training;Therapeutic exercise;Patient/family education;Neuromuscular education;Manual Therapy;Therapeutic exercises;Electrical Stimulation;Passive range of motion   Plan Continue to focus on RUE neuro re education and use in functional tasks.    OT Home Exercise Plan Patient has established HEP from previous sessions and will continue to add to plan   Consulted and Agree with Plan of Care Patient        Problem List Patient Active Problem List   Diagnosis Date Noted  . Shoulder subluxation, right 04/23/2014  . Spastic hemiplegia affecting dominant side 02/18/2014  . HTN (hypertension) 01/17/2014  . Dyslipidemia  01/17/2014  . CVA (cerebral infarction) 12/27/2013   Fariha Goto T Tomasita Morrow, OTR/L, CLT 10/03/2014, 5:08 PM  [place] MAIN [facility] SERVICES [address] [place], [place], [address] Phone: [phone]   Fax:  [phone]

## 2014-10-03 NOTE — Patient Instructions (Signed)
Patient to continue to work on HEP, ROM/stretching and attempting to incorporate right UE into functional tasks.

## 2014-10-07 ENCOUNTER — Ambulatory Visit: Payer: BLUE CROSS/BLUE SHIELD | Admitting: Occupational Therapy

## 2014-10-07 DIAGNOSIS — M6281 Muscle weakness (generalized): Secondary | ICD-10-CM

## 2014-10-07 DIAGNOSIS — I69398 Other sequelae of cerebral infarction: Secondary | ICD-10-CM | POA: Diagnosis not present

## 2014-10-07 DIAGNOSIS — G8191 Hemiplegia, unspecified affecting right dominant side: Secondary | ICD-10-CM

## 2014-10-07 DIAGNOSIS — IMO0002 Reserved for concepts with insufficient information to code with codable children: Secondary | ICD-10-CM

## 2014-10-08 ENCOUNTER — Encounter: Payer: Self-pay | Admitting: Occupational Therapy

## 2014-10-08 NOTE — Therapy (Signed)
[place] MAIN [facility] SERVICES [address] [place], [place], [address] Phone: [phone]   Fax:  [phone]  Occupational Therapy Treatment  Patient Details  Name: [name] MRN: [id number] Date of Birth: [birth year] Referring Provider:  No ref. provider found  Encounter Date: 10/07/2014      OT End of Session - 10/07/14 1147    Visit Number 65   Number of Visits 22   Date for OT Re-Evaluation 12/14/14   OT Start Time 1101   OT Stop Time 1148   OT Time Calculation (min) 47 min   Behavior During Therapy [facility] for tasks assessed/performed      Past Medical History  Diagnosis Date  . Stroke     Past Surgical History  Procedure Laterality Date  . Knee arthroscopy Right     There were no vitals filed for this visit.  Visit Diagnosis:  Lack of coordination due to stroke  Muscle weakness  Hemiplegia affecting right dominant side      Subjective Assessment - 10/07/14 1147    Subjective  Patient reports he is doing better with holding on and steering the grocery cart around the store.  He is also able to place his right hand in his pocket how.   Pertinent History Botox injections in right UE on Monday 09-23-14   Patient Stated Goals Patient wants to be independent with all self care and IADL tasks.   Currently in Pain? No/denies   Multiple Pain Sites No                      OT Treatments/Exercises (OP) - 10/08/14 0001    ADLs   Home Maintenance Continued focus on grip for different tool use at home around the house and with yardwork. He still needs some assistance with positions of the hand requiring more supination.    Neurological Re-education Exercises   Other Exercises 1 Patient seen for RUE ROM and facilitation of movement to utilize in ADL tasks as an assist at this point. Supine PROM followed by [facility] for shoulder flexion, ABD/ADD, Elbow flexion/extension, ER, wrist extension and finger flexion/extension  followed by place and hold for 10 secs with therapist assist to guide arm and assist with placement. Cues for patient to control the arm with decent from flexion. ADD to reach across body to opposite shoulder and opposite hip with cues and facilitation at the elbow.   Other Exercises 2 Focused this date in sitting for wrist extension and preparing the right arm for weight bearing tasks.  Patient denies any pain but feels a great amount of stretch at the wrist.  Repeated trials of extension of the wrist and digits.     Functional Reaching Activities   Low Level Reaching tasks in sitting, reaching down with gravity assist and to inhibit tone.                OT Education - 10/07/14 1147    Education provided Yes   Education Details Continued focus on grasp on tools at home.   Person(s) Educated Patient   Methods Explanation;Demonstration;Tactile cues;Verbal cues   Comprehension Verbalized understanding;Returned demonstration;Verbal cues required;Tactile cues required             OT Long Term Goals - 09/25/14 1659    OT LONG TERM GOAL #1   Title Patient will improve R UE function to be able to complete bilateral ADL   Baseline Patient is using RUE as  a gross assist more in the last month, still has difficulty with bilateral tasks.   Time 12   Period Weeks   Status On-going   OT LONG TERM GOAL #2   Title Patient will be able to utilize RUE as an assist with washing the hood of his car    Baseline Patient using RUE hand over hand for task but lacks wrist extension which limits task.   Time 12   Period Weeks   Status On-going   OT LONG TERM GOAL #3   Title Patient will demo right shoulder ABD sufficient to apply deodorant independently   Baseline Patient able to perform with minimal ABD but is working towards greater ROM to perform without difficulty.   Time 12   Period Weeks   Status Partially Met   OT LONG TERM GOAL #4   Title will improve supination of right hand by 20  degrees to help stabilize hand on rake for yardwork   Baseline Patient is now having to place hand on rake in a pronated position.   Time 12   Period Weeks   Status On-going   OT LONG TERM GOAL #5   Title Patient will improve right grip sufficient to stabilize garden tool in his hand to dig dirt for flowers    Baseline Beginning to show signs of weak grasp on right but unable to hold with enough strength to dig dirt.   Time 12   Period Weeks   Status On-going   Long Term Additional Goals   Additional Long Term Goals Yes   OT LONG TERM GOAL #6   Title Patient will complete folding of laundry using right hand as an assist during task    Baseline Patient continues to require assistance with task but is able to stabilize select pieces of laundry.   Time 12   Period Weeks   Status On-going               Plan - 10/07/14 1148    Clinical Impression Statement Patient reported some mild pain at home prior to coming to treatment session but reports the pain has diminished and no longer present after completeing therapy this date.   He has progressed to being able to demonstrate placing his right hand into his pocket and using the right hand on the grocery cart and steering with improved accuracy.  He is attempting to [facility] his types of grasp and ROM to use tools such as rake, broom, dustpan, floor sweeper and vacuum.  He would continue to benefit from OT to maximize his safety and independence in daily tasks especially now in conjunction with the recent Botox injections.    Pt will benefit from skilled therapeutic intervention in order to improve on the following deficits (Retired) Decreased coordination;Decreased range of motion;Impaired flexibility;Impaired tone;Impaired UE functional use;Pain;Decreased strength   Rehab Potential Good   Clinical Impairments Affecting Rehab Potential increased spasticity of the right side.   OT Frequency 2x / week   OT Treatment/Interventions Self-care/ADL  training;Therapeutic exercise;Patient/family education;Neuromuscular education;Manual Therapy;Therapeutic exercises;Electrical Stimulation;Passive range of motion   OT Home Exercise Plan Patient has established HEP from previous sessions and will continue to add to plan        Problem List Patient Active Problem List   Diagnosis Date Noted  . Shoulder subluxation, right 04/23/2014  . Spastic hemiplegia affecting dominant side 02/18/2014  . HTN (hypertension) 01/17/2014  . Dyslipidemia 01/17/2014  . CVA (cerebral infarction) 12/27/2013   Amy  Oneita Jolly, OTR/L, CLT 10/08/2014, 2:13 PM  [place] MAIN [facility] SERVICES [address] [place], [place], [address] Phone: [phone]   Fax:  [phone]

## 2014-10-09 ENCOUNTER — Ambulatory Visit: Payer: BLUE CROSS/BLUE SHIELD | Admitting: Occupational Therapy

## 2014-10-09 DIAGNOSIS — M6281 Muscle weakness (generalized): Secondary | ICD-10-CM

## 2014-10-09 DIAGNOSIS — G8191 Hemiplegia, unspecified affecting right dominant side: Secondary | ICD-10-CM

## 2014-10-09 DIAGNOSIS — IMO0002 Reserved for concepts with insufficient information to code with codable children: Secondary | ICD-10-CM

## 2014-10-09 DIAGNOSIS — I69398 Other sequelae of cerebral infarction: Secondary | ICD-10-CM | POA: Diagnosis not present

## 2014-10-10 NOTE — Therapy (Signed)
[place] [facility] MAIN [facility] SERVICES [address] [place], [place], [address] Phone: [phone]   Fax:  [phone]  Occupational Therapy Treatment  Patient Details  Name: [name] MRN: [id number] Date of Birth: 1955-10-13 Referring Provider:  No ref. provider found  Encounter Date: 10/09/2014      OT End of Session - 10/09/14 1200    Visit Number 66   Number of Visits 85   OT Start Time 1102   OT Stop Time 1149   OT Time Calculation (min) 47 min   Behavior During Therapy WFL for tasks assessed/performed      Past Medical History  Diagnosis Date  . Stroke     Past Surgical History  Procedure Laterality Date  . Knee arthroscopy Right     There were no vitals filed for this visit.  Visit Diagnosis:  Lack of coordination due to stroke  Muscle weakness  Hemiplegia affecting right dominant side      Subjective Assessment - 10/09/14 1115    Subjective  Patient reports he went to the park yesterday and laid down and did his exercises repeatedly, was there for about 2 hours.  Feels good today.   Pertinent History Botox injections in right UE on Monday 09-23-14   Patient Stated Goals Patient wants to be independent with all self care and IADL tasks.   Currently in Pain? No/denies   Multiple Pain Sites No                      OT Treatments/Exercises (OP) - 10/10/14 0001    ADLs   Grooming Focused on gross lateral pinch and release of ADL objects to retrieve during self care tasks.  Patient requires guiding and cues from therapist.    Neurological Re-education Exercises   Other Exercises 1 Patient seen for RUE ROM and facilitation of movement to utilize in ADL tasks as an assist at this point. Supine PROM followed by [facility] for shoulder flexion, ABD/ADD, Elbow flexion/extension, ER, wrist extension and finger flexion/extension followed by place and hold for 10 secs with therapist assist to guide arm and assist with  placement. Cues for patient to control the arm with decent from flexion. ADD to reach across body to opposite shoulder and opposite hip with cues and facilitation at the elbow.  Additional focus on supination of the right forearm with stretch using dowel in sitting with cues from therapist. Wrist extension stretches manually performed by therapist to promote increased extension of the wrist to help achieve a weight bearing position in outstretched hand position.  Still lacks full range and control to place weight through palm.                OT Education - 10/09/14 1115    Education provided Yes   Education Details Patient instructed on supination of the forearm and weight bearing positions to focus on for the next few days.    Person(s) Educated Patient   Methods Demonstration;Verbal cues   Comprehension Verbalized understanding;Returned demonstration;Verbal cues required;Tactile cues required;Need further instruction             OT Long Term Goals - 09/25/14 1659    OT LONG TERM GOAL #1   Title Patient will improve R UE function to be able to complete bilateral ADL   Baseline Patient is using RUE as a gross assist more in the last month, still has difficulty with bilateral tasks.   Time 12  Period Weeks   Status On-going   OT LONG TERM GOAL #2   Title Patient will be able to utilize RUE as an assist with washing the hood of his car    Baseline Patient using RUE hand over hand for task but lacks wrist extension which limits task.   Time 12   Period Weeks   Status On-going   OT LONG TERM GOAL #3   Title Patient will demo right shoulder ABD sufficient to apply deodorant independently   Baseline Patient able to perform with minimal ABD but is working towards greater ROM to perform without difficulty.   Time 12   Period Weeks   Status Partially Met   OT LONG TERM GOAL #4   Title will improve supination of right hand by 20 degrees to help stabilize hand on rake for yardwork    Baseline Patient is now having to place hand on rake in a pronated position.   Time 12   Period Weeks   Status On-going   OT LONG TERM GOAL #5   Title Patient will improve right grip sufficient to stabilize garden tool in his hand to dig dirt for flowers    Baseline Beginning to show signs of weak grasp on right but unable to hold with enough strength to dig dirt.   Time 12   Period Weeks   Status On-going   Long Term Additional Goals   Additional Long Term Goals Yes   OT LONG TERM GOAL #6   Title Patient will complete folding of laundry using right hand as an assist during task    Baseline Patient continues to require assistance with task but is able to stabilize select pieces of laundry.   Time 12   Period Weeks   Status On-going               Plan - 10/09/14 1149    Clinical Impression Statement Patient continues to demo improvements with RUE use, decreased spasticity noted, increased shoulder flexion to 138 this date after PROM and AAROM tasks.  He has been able to place and hold right arm in 90 degrees of shoulder flexion and hold for up to 42 secs with dropping arm and able to control on descent.  He is becoming more consistent with ROM and use in daily tasks.    Pt will benefit from skilled therapeutic intervention in order to improve on the following deficits (Retired) Decreased coordination;Decreased range of motion;Impaired flexibility;Impaired tone;Impaired UE functional use;Pain;Decreased strength   Rehab Potential Good   Clinical Impairments Affecting Rehab Potential increased spasticity of the right side.   OT Frequency 2x / week   OT Duration 12 weeks   OT Treatment/Interventions Self-care/ADL training;Therapeutic exercise;Patient/family education;Neuromuscular education;Manual Therapy;Therapeutic exercises;Electrical Stimulation;Passive range of motion   OT Home Exercise Plan Patient has established HEP from previous sessions and will continue to add to plan    Consulted and Agree with Plan of Care Patient        Problem List Patient Active Problem List   Diagnosis Date Noted  . Shoulder subluxation, right 04/23/2014  . Spastic hemiplegia affecting dominant side 02/18/2014  . HTN (hypertension) 01/17/2014  . Dyslipidemia 01/17/2014  . CVA (cerebral infarction) 12/27/2013   Amy T Tomasita Morrow, OTR/L, CLT 10/10/2014, 1:53 PM  [place] MAIN [facility] SERVICES [address] [place], [place], [address] Phone: [phone]   Fax:  [phone]

## 2014-10-14 ENCOUNTER — Encounter: Payer: Self-pay | Admitting: Occupational Therapy

## 2014-10-14 ENCOUNTER — Ambulatory Visit: Payer: BLUE CROSS/BLUE SHIELD | Admitting: Occupational Therapy

## 2014-10-14 DIAGNOSIS — M6281 Muscle weakness (generalized): Secondary | ICD-10-CM

## 2014-10-14 DIAGNOSIS — IMO0002 Reserved for concepts with insufficient information to code with codable children: Secondary | ICD-10-CM

## 2014-10-14 DIAGNOSIS — G8191 Hemiplegia, unspecified affecting right dominant side: Secondary | ICD-10-CM

## 2014-10-14 DIAGNOSIS — I69398 Other sequelae of cerebral infarction: Secondary | ICD-10-CM | POA: Diagnosis not present

## 2014-10-14 NOTE — Therapy (Signed)
[place] [facility] MAIN REHAB SERVICES [address] , [place], [address] Phone: [phone]   Fax:  [phone]  Occupational Therapy Treatment  Patient Details  Name: [name] MRN: [id number] Date of Birth: 04/10/1956 Referring Provider:  No ref. provider found  Encounter Date: 10/14/2014      OT End of Session - 10/14/14 1642    Visit Number 67   Number of Visits 85   Date for OT Re-Evaluation 12/14/14   OT Start Time 1101   OT Stop Time 1157   OT Time Calculation (min) 56 min   Activity Tolerance Patient tolerated treatment well;No increased pain   Behavior During Therapy WFL for tasks assessed/performed      Past Medical History  Diagnosis Date  . Stroke     Past Surgical History  Procedure Laterality Date  . Knee arthroscopy Right     There were no vitals filed for this visit.  Visit Diagnosis:  Hemiplegia affecting right dominant side  Lack of coordination due to stroke  Muscle weakness      Subjective Assessment - 10/14/14 1105    Subjective  Patient reports he did some exercises over the weekend.  Patient reports he is able to use his right hand to reach and pull down left shirt sleeve.  Still working on place and hold of arm with increased time and more control.     Pertinent History Botox injections in right UE on Monday 09-23-14   Patient Stated Goals Patient wants to be independent with all self care and IADL tasks.   Currently in Pain? No/denies   Pain Score 0-No pain                      OT Treatments/Exercises (OP) - 10/14/14 0001    Neurological Re-education Exercises   Scapular Stabilization Right;10 reps;Supine   Other Exercises 1 upine AAROM for shoulder flexion for place and hold for 10 secs with therapist assist to guide arm and assist with placement. Cues for patient to control the arm with decent from flexion. Shoulder ABD/ADD, external rotation, forearm supination, elbow  flexion/extension. ADD to reach across body to opposite shoulder and opposite hip with cues and facilitation at the elbow.   Shoulder protraction in supine for 10 reps, large block exercises with bilateral hands onto block with right hand in a neutral position with assist from therapist to keep hand in proper form and to guide thumb into ABD.  Block overhead for 2 sets of 10 reps with minimal guiding and cues from therapist, Chest press for 2 sets of 10 reps with cues for elbow extension on the right side.    Weight Bearing Position Seated;Standing   Seated with weight on hand Focused on weight bearing onto right UE outstretched hand after therapist performed wrist stretches between sets to be able to achieve weight bearing positioning.  He was able to keep hand in place with weight through it for up to a minute without the hand sliding.  Attempted in standing but modifying with a block and will need to achieve greater wrist extension to be effective.                 OT Education - 10/14/14 1641    Education provided Yes   Education Details Modifications to weight bearing in standing for right upper extremity.   Person(s) Educated Patient   Methods Explanation;Demonstration;Tactile cues;Verbal cues   Comprehension Verbalized understanding;Returned demonstration;Verbal cues required;Tactile   cues required;Need further instruction             OT Long Term Goals - 09/25/14 1659    OT LONG TERM GOAL #1   Title Patient will improve R UE function to be able to complete bilateral ADL   Baseline Patient is using RUE as a gross assist more in the last month, still has difficulty with bilateral tasks.   Time 12   Period Weeks   Status On-going   OT LONG TERM GOAL #2   Title Patient will be able to utilize RUE as an assist with washing the hood of his car    Baseline Patient using RUE hand over hand for task but lacks wrist extension which limits task.   Time 12   Period Weeks   Status  On-going   OT LONG TERM GOAL #3   Title Patient will demo right shoulder ABD sufficient to apply deodorant independently   Baseline Patient able to perform with minimal ABD but is working towards greater ROM to perform without difficulty.   Time 12   Period Weeks   Status Partially Met   OT LONG TERM GOAL #4   Title will improve supination of right hand by 20 degrees to help stabilize hand on rake for yardwork   Baseline Patient is now having to place hand on rake in a pronated position.   Time 12   Period Weeks   Status On-going   OT LONG TERM GOAL #5   Title Patient will improve right grip sufficient to stabilize garden tool in his hand to dig dirt for flowers    Baseline Beginning to show signs of weak grasp on right but unable to hold with enough strength to dig dirt.   Time 12   Period Weeks   Status On-going   Long Term Additional Goals   Additional Long Term Goals Yes   OT LONG TERM GOAL #6   Title Patient will complete folding of laundry using right hand as an assist during task    Baseline Patient continues to require assistance with task but is able to stabilize select pieces of laundry.   Time 12   Period Weeks   Status On-going               Plan - 10/14/14 1642    Clinical Impression Statement Patient was able to demo weight bearing onto right hand today after stretches to wrist and was able to maintain position without wrist moving.  He continues to progress with ROM in right UE arm and hand, able to place right hand into pockets now.  He still has limited pinch skills on the right and needs increased cues and facilitation for release of objects.  Patient continues to work on grasp for different types of tool use.    Pt will benefit from skilled therapeutic intervention in order to improve on the following deficits (Retired) Decreased coordination;Decreased range of motion;Impaired flexibility;Impaired tone;Impaired UE functional use;Pain;Decreased strength    Rehab Potential Good   Clinical Impairments Affecting Rehab Potential increased spasticity of the right side.   OT Frequency 2x / week   OT Duration 12 weeks   OT Treatment/Interventions Self-care/ADL training;Therapeutic exercise;Patient/family education;Neuromuscular education;Manual Therapy;Therapeutic exercises;Electrical Stimulation;Passive range of motion   OT Home Exercise Plan Patient has established HEP from previous sessions and will continue to add to plan   Consulted and Agree with Plan of Care Patient        Problem List Patient  Active Problem List   Diagnosis Date Noted  . Shoulder subluxation, right 04/23/2014  . Spastic hemiplegia affecting dominant side 02/18/2014  . HTN (hypertension) 01/17/2014  . Dyslipidemia 01/17/2014  . CVA (cerebral infarction) 12/27/2013   Amy T Lovett, OTR/L, CLT 10/14/2014, 4:47 PM  [place] [facility] MAIN REHAB SERVICES [address] [place], [place], [address] Phone: [phone]   Fax:  [phone]    

## 2014-10-16 ENCOUNTER — Ambulatory Visit: Payer: BLUE CROSS/BLUE SHIELD | Admitting: Occupational Therapy

## 2014-10-16 DIAGNOSIS — G8191 Hemiplegia, unspecified affecting right dominant side: Secondary | ICD-10-CM

## 2014-10-16 DIAGNOSIS — I69398 Other sequelae of cerebral infarction: Secondary | ICD-10-CM | POA: Diagnosis not present

## 2014-10-16 DIAGNOSIS — M6281 Muscle weakness (generalized): Secondary | ICD-10-CM

## 2014-10-16 DIAGNOSIS — IMO0002 Reserved for concepts with insufficient information to code with codable children: Secondary | ICD-10-CM

## 2014-10-17 ENCOUNTER — Encounter: Payer: Self-pay | Admitting: Occupational Therapy

## 2014-10-17 NOTE — Therapy (Signed)
[place] MAIN [facility] SERVICES [address] [place], [place], [address] Phone: [phone]   Fax:  [phone]  Occupational Therapy Treatment  Patient Details  Name: [name] MRN: [id number] Date of Birth: Apr 21, 1956 Referring Provider:  No ref. provider found  Encounter Date: 10/16/2014      OT End of Session - 10/16/14 1200    Visit Number 68   Number of Visits 85   Date for OT Re-Evaluation 12/14/14   OT Start Time 1100   OT Stop Time 1148   OT Time Calculation (min) 48 min   Activity Tolerance Patient tolerated treatment well;No increased pain   Behavior During Therapy [facility] for tasks assessed/performed      Past Medical History  Diagnosis Date  . Stroke     Past Surgical History  Procedure Laterality Date  . Knee arthroscopy Right     There were no vitals filed for this visit.  Visit Diagnosis:  Hemiplegia affecting right dominant side  Lack of coordination due to stroke  Muscle weakness      Subjective Assessment - 10/16/14 1109    Subjective  Patient reports he is still working on doing exercises and using the hand more in daily tasks at home.  Reaching with greater ease and placing right hand on items around the house, stabilizing items when possible.     Pertinent History Botox injections in right UE on Monday 09-23-14   Patient Stated Goals Patient wants to be independent with all self care and IADL tasks.   Currently in Pain? No/denies   Pain Score 0-No pain   Multiple Pain Sites No                      OT Treatments/Exercises (OP) - 10/16/14    ADLs   Grooming Simulated application of deodorant to left arm pit, placing items in pockets, reaching to obtain adl items from various heights, weight bearing during acts in standing at sink for grooming.   Neurological Re-education Exercises   Other Exercises 1 Patient seen for RUE ROM and facilitation of movement to utilize in ADL tasks as an  assist at this point. Supine AAROM for shoulder flexion for place and hold for 10 secs with therapist assist to guide arm and assist with placement. Cues for patient to control the arm with decent from flexion. Shoulder ABD/ADD, external rotation, forearm supination, elbow flexion/extension. ADD to reach across body to opposite shoulder and opposite hip with cues and facilitation at the elbow. Sitting, patient seen for forward flexion with reach between legs towards floor to pick up item using lateral pinch on the right.   Weight Bearing Position Seated;Standing   Seated with weight on hand Continued focus on wrist stretches prior to weight bearing, then progressed from weight bearing to outstretched arm to in standing. with therapists assistance to position arm.                OT Education - 10/16/14 1110    Education provided Yes   Education Details Continued focus on positions for weight bearing tasks and use of right UE as a gross assist and stabilizer for use in daily functional tasks at home.    Person(s) Educated Patient   Methods Explanation;Demonstration;Verbal cues   Comprehension Verbalized understanding;Returned demonstration;Verbal cues required;Tactile cues required;Need further instruction             OT Long Term Goals - 09/25/14 1659    OT  LONG TERM GOAL #1   Title Patient will improve R UE function to be able to complete bilateral ADL   Baseline Patient is using RUE as a gross assist more in the last month, still has difficulty with bilateral tasks.   Time 12   Period Weeks   Status On-going   OT LONG TERM GOAL #2   Title Patient will be able to utilize RUE as an assist with washing the hood of his car    Baseline Patient using RUE hand over hand for task but lacks wrist extension which limits task.   Time 12   Period Weeks   Status On-going   OT LONG TERM GOAL #3   Title Patient will demo right shoulder ABD sufficient to apply deodorant independently    Baseline Patient able to perform with minimal ABD but is working towards greater ROM to perform without difficulty.   Time 12   Period Weeks   Status Partially Met   OT LONG TERM GOAL #4   Title will improve supination of right hand by 20 degrees to help stabilize hand on rake for yardwork   Baseline Patient is now having to place hand on rake in a pronated position.   Time 12   Period Weeks   Status On-going   OT LONG TERM GOAL #5   Title Patient will improve right grip sufficient to stabilize garden tool in his hand to dig dirt for flowers    Baseline Beginning to show signs of weak grasp on right but unable to hold with enough strength to dig dirt.   Time 12   Period Weeks   Status On-going   Long Term Additional Goals   Additional Long Term Goals Yes   OT LONG TERM GOAL #6   Title Patient will complete folding of laundry using right hand as an assist during task    Baseline Patient continues to require assistance with task but is able to stabilize select pieces of laundry.   Time 12   Period Weeks   Status On-going               Plan - 10/16/14 1155    Clinical Impression Statement Patient continues to improve with weight bearing positioning of right UE, encouraged stretching of right wrist prior to weight bearing attempts, perform first in sitting and attempt to progress in standing with modifications for position.  Patient denied any pain in the right wrist with weight bearing tasks this date.  His pinch in right hand remains limited at this time but had some initiation of finger extension for 1 to 2 times during session.     Pt will benefit from skilled therapeutic intervention in order to improve on the following deficits (Retired) Decreased coordination;Decreased range of motion;Impaired flexibility;Impaired tone;Impaired UE functional use;Pain;Decreased strength   Rehab Potential Good   OT Frequency 2x / week   OT Duration 12 weeks   OT Treatment/Interventions  Self-care/ADL training;Therapeutic exercise;Patient/family education;Neuromuscular education;Manual Therapy;Therapeutic exercises;Electrical Stimulation;Passive range of motion   OT Home Exercise Plan Patient has established HEP from previous sessions and will continue to add to plan   Consulted and Agree with Plan of Care Patient        Problem List Patient Active Problem List   Diagnosis Date Noted  . Shoulder subluxation, right 04/23/2014  . Spastic hemiplegia affecting dominant side 02/18/2014  . HTN (hypertension) 01/17/2014  . Dyslipidemia 01/17/2014  . CVA (cerebral infarction) 12/27/2013   Nisreen Guise Oneita Jolly,  OTR/L, CLT Carollynn Pennywell 10/17/2014, 10:01 AM  [place] MAIN [facility] SERVICES [address] [place], [place], [address] Phone: [phone]   Fax:  [phone]

## 2014-10-23 ENCOUNTER — Ambulatory Visit: Payer: BLUE CROSS/BLUE SHIELD | Attending: Physical Medicine & Rehabilitation | Admitting: Occupational Therapy

## 2014-10-23 ENCOUNTER — Encounter: Payer: Self-pay | Admitting: Occupational Therapy

## 2014-10-23 DIAGNOSIS — I6980 Unspecified sequelae of other cerebrovascular disease: Secondary | ICD-10-CM | POA: Insufficient documentation

## 2014-10-23 DIAGNOSIS — M6281 Muscle weakness (generalized): Secondary | ICD-10-CM | POA: Diagnosis not present

## 2014-10-23 DIAGNOSIS — G8191 Hemiplegia, unspecified affecting right dominant side: Secondary | ICD-10-CM | POA: Insufficient documentation

## 2014-10-23 DIAGNOSIS — R279 Unspecified lack of coordination: Secondary | ICD-10-CM | POA: Diagnosis present

## 2014-10-23 DIAGNOSIS — IMO0002 Reserved for concepts with insufficient information to code with codable children: Secondary | ICD-10-CM

## 2014-10-23 NOTE — Therapy (Signed)
[place] MAIN [facility] SERVICES [address] [place], [place], [address] Phone: [phone]   Fax:  [phone]  Occupational Therapy Treatment  Patient Details  Name: [name] MRN: [id number] Date of Birth: 10-25-1955 Referring Provider:  No ref. provider found  Encounter Date: 10/23/2014      OT End of Session - 10/23/14 1439    Visit Number 5   Number of Visits 37   Date for OT Re-Evaluation 12/14/14   OT Start Time 1057   OT Stop Time 1148   OT Time Calculation (min) 51 min   Activity Tolerance Patient tolerated treatment well;No increased pain   Behavior During Therapy [facility] for tasks assessed/performed      Past Medical History  Diagnosis Date  . Stroke     Past Surgical History  Procedure Laterality Date  . Knee arthroscopy Right     There were no vitals filed for this visit.  Visit Diagnosis:  Hemiplegia affecting right dominant side  Lack of coordination due to stroke  Muscle weakness      Subjective Assessment - 10/23/14 1437    Subjective  Patient reports he went out of town this weekend to [place] for a family gathering and baby shower.  He tried to exercise as much as possible and walked the halls in the hotel.  Reports it was difficult to do shoulder exercises lying on the bed since it was too soft.   Pertinent History Botox injections in right UE on Monday 09-23-14   Patient Stated Goals Patient wants to be independent with all self care and IADL tasks.   Currently in Pain? No/denies   Pain Score 0-No pain   Multiple Pain Sites No                      OT Treatments/Exercises (OP) - 10/23/14 0001    ADLs   ADL Comments Patient seen for grasp and release of ADL objects using a lateral pinch and gross grasp technique.  Difficulty with release of objects and requires facilitation and tapping.   Neurological Re-education Exercises   Scapular Stabilization Right;10 reps;Supine   Other  Exercises 1 Patient seen for RUE ROM and facilitation of movement to utilize in ADL tasks as an assist at this point. Supine AAROM for shoulder flexion for place and hold for 10 secs with therapist assist to guide arm and assist with placement. Cues for patient to control the arm with decent from flexion. Shoulder ABD/ADD, external rotation, forearm supination, elbow flexion/extension. ADD to reach across body to opposite shoulder and opposite hip with cues and facilitation at the elbow. Tone inhibition techniques with trunk rotation, trunk dissociation, rolling side to side with cues from therapist, verbal and tactile. Prolonged stretch towards ER.    Weight Bearing Position Seated   Seated with weight on hand Assistance from therapist to place right arm in a position of weight bearing on mat.  Therapist performed 6 general wrist stretches prior to weight bearing to prepare the arm for the positioning. No pain reported and able to hold hand stable for short periods of time but still needs to increase wrist extension.                OT Education - 10/23/14 1439    Education provided Yes   Education Details HEP with weight bearing, pinch patterns, reaching and ROM tasks.   Person(s) Educated Patient   Methods Explanation;Demonstration;Verbal cues   Comprehension Returned  demonstration;Verbalized understanding;Verbal cues required;Tactile cues required             OT Long Term Goals - 09/25/14 1659    OT LONG TERM GOAL #1   Title Patient will improve R UE function to be able to complete bilateral ADL   Baseline Patient is using RUE as a gross assist more in the last month, still has difficulty with bilateral tasks.   Time 12   Period Weeks   Status On-going   OT LONG TERM GOAL #2   Title Patient will be able to utilize RUE as an assist with washing the hood of his car    Baseline Patient using RUE hand over hand for task but lacks wrist extension which limits task.   Time 12    Period Weeks   Status On-going   OT LONG TERM GOAL #3   Title Patient will demo right shoulder ABD sufficient to apply deodorant independently   Baseline Patient able to perform with minimal ABD but is working towards greater ROM to perform without difficulty.   Time 12   Period Weeks   Status Partially Met   OT LONG TERM GOAL #4   Title will improve supination of right hand by 20 degrees to help stabilize hand on rake for yardwork   Baseline Patient is now having to place hand on rake in a pronated position.   Time 12   Period Weeks   Status On-going   OT LONG TERM GOAL #5   Title Patient will improve right grip sufficient to stabilize garden tool in his hand to dig dirt for flowers    Baseline Beginning to show signs of weak grasp on right but unable to hold with enough strength to dig dirt.   Time 12   Period Weeks   Status On-going   Long Term Additional Goals   Additional Long Term Goals Yes   OT LONG TERM GOAL #6   Title Patient will complete folding of laundry using right hand as an assist during task    Baseline Patient continues to require assistance with task but is able to stabilize select pieces of laundry.   Time 12   Period Weeks   Status On-going               Plan - 10/23/14 1440    Clinical Impression Statement Patient consistently working on exercises at home.  Spasticity still limits his use of the right arm with many tasks, especially spasticity in the right elbow for flexion which affects his reach. Patient continues to show improvements with weight bearing, positioning of arm for support, use of hand for  light pinching /grasping tasks, and incorporation of arm into functional tasks at home.  Patient able to perform place and hold of shoulder in flexion increased time and able to control the arm on decent.     Pt will benefit from skilled therapeutic intervention in order to improve on the following deficits (Retired) Decreased coordination;Decreased  range of motion;Impaired flexibility;Impaired tone;Impaired UE functional use;Pain;Decreased strength   Clinical Impairments Affecting Rehab Potential spasticity of the right side especially at the elbow for flexion   OT Frequency 2x / week   OT Duration 12 weeks   OT Treatment/Interventions Self-care/ADL training;Therapeutic exercise;Patient/family education;Neuromuscular education;Manual Therapy;Therapeutic exercises;Electrical Stimulation;Passive range of motion   Plan Continue with tone inhibition techniques, strengthening and functional use of right UE for daily tasks at home and in the community.   OT Home Exercise Plan  Patient has established HEP from previous sessions and will continue to add to plan   Consulted and Agree with Plan of Care Patient        Problem List Patient Active Problem List   Diagnosis Date Noted  . Shoulder subluxation, right 04/23/2014  . Spastic hemiplegia affecting dominant side 02/18/2014  . HTN (hypertension) 01/17/2014  . Dyslipidemia 01/17/2014  . CVA (cerebral infarction) 12/27/2013   Dayan Desa T Tomasita Morrow, OTR/L, CLT 10/23/2014, 2:52 PM  [place] MAIN [facility] SERVICES [address] [place], [place], [address] Phone: [phone]   Fax:  [phone]

## 2014-10-24 ENCOUNTER — Ambulatory Visit: Payer: BLUE CROSS/BLUE SHIELD | Admitting: Occupational Therapy

## 2014-10-24 ENCOUNTER — Encounter: Payer: Self-pay | Admitting: Occupational Therapy

## 2014-10-24 DIAGNOSIS — G8191 Hemiplegia, unspecified affecting right dominant side: Secondary | ICD-10-CM

## 2014-10-24 DIAGNOSIS — M6281 Muscle weakness (generalized): Secondary | ICD-10-CM

## 2014-10-24 DIAGNOSIS — IMO0002 Reserved for concepts with insufficient information to code with codable children: Secondary | ICD-10-CM

## 2014-10-24 NOTE — Therapy (Signed)
[place] MAIN [facility] SERVICES [address] [place], [place], [address] Phone: [phone]   Fax:  [phone]  Occupational Therapy Treatment  Patient Details  Name: [name] MRN: [id number] Date of Birth: 1955/09/10 Referring Provider:  Meredith Staggers, MD  Encounter Date: 10/24/2014      OT End of Session - 10/24/14 1610    Visit Number 70   Number of Visits 85   Date for OT Re-Evaluation 12/14/14   OT Start Time 1300   OT Stop Time 1350   OT Time Calculation (min) 50 min   Activity Tolerance Patient tolerated treatment well;No increased pain   Behavior During Therapy [facility] for tasks assessed/performed      Past Medical History  Diagnosis Date  . Stroke     Past Surgical History  Procedure Laterality Date  . Knee arthroscopy Right     There were no vitals filed for this visit.  Visit Diagnosis:  Hemiplegia affecting right dominant side  Lack of coordination due to stroke  Muscle weakness      Subjective Assessment - 10/24/14 1606    Subjective  Patient reports he noticed some finger extension in his right hand and states, "I wasn't sure if I was imagining it or not."  Patient reports some tightness and almost cramping in his right latissimus area this date but denies pain.   Pertinent History Botox injections in right UE on Monday 09-23-14   Patient Stated Goals Patient wants to be independent with all self care and IADL tasks.   Currently in Pain? No/denies   Pain Score 0-No pain   Multiple Pain Sites No            [facility] OT Assessment - 10/24/14 0001    Assessment   Diagnosis stroke                  OT Treatments/Exercises (OP) - 10/24/14 0001    Neurological Re-education Exercises   Other Exercises 1 Patient seen for RUE ROM and facilitation of movement to utilize in ADL tasks as an assist at this point. Supine AAROM for shoulder flexion for place and hold for 10 secs with therapist assist to  guide arm and assist with placement. Cues for patient to control the arm with decent from flexion. Shoulder ABD/ADD, external rotation, forearm supination, elbow flexion/extension. ADD to reach across body to opposite shoulder and opposite hip with cues and facilitation at the elbow. Tone inhibition techniques with trunk rotation, trunk dissociation, rolling side to side with cues from therapist, verbal and tactile. Prolonged stretch towards ER.Patient performed AAROM with medium sized therapy ball with guiding and assist from therapist to position and hold right arm on the ball to move into shoulder flexion, chest press for 5 reps for 3 sets each.     Trunk Exercises Rotation   Trunk Rotation trunk dissociation and rotation movements with minimal assist.   Manual Therapy   Manual Therapy Joint mobilization;Scapular mobilization;Passive ROM   Joint Mobilization posterior and inferior joint mobs, grade II to improve ROM.  Manual stretching through trunk with rotation and dissociation.    Scapular Mobilization Right scapula for elevation, ABD and upwards rotation to assist with facilitation of movement and reach    Passive ROM to RUE prior to facilitation of movement                OT Education - 10/24/14 1609    Education provided Yes   Education Details  Weight bearing, finger extension trials.   Person(s) Educated Patient   Methods Explanation;Demonstration;Tactile cues;Verbal cues   Comprehension Verbal cues required;Returned demonstration;Verbalized understanding;Tactile cues required             OT Long Term Goals - 09/25/14 1659    OT LONG TERM GOAL #1   Title Patient will improve R UE function to be able to complete bilateral ADL   Baseline Patient is using RUE as a gross assist more in the last month, still has difficulty with bilateral tasks.   Time 12   Period Weeks   Status On-going   OT LONG TERM GOAL #2   Title Patient will be able to utilize RUE as an assist with  washing the hood of his car    Baseline Patient using RUE hand over hand for task but lacks wrist extension which limits task.   Time 12   Period Weeks   Status On-going   OT LONG TERM GOAL #3   Title Patient will demo right shoulder ABD sufficient to apply deodorant independently   Baseline Patient able to perform with minimal ABD but is working towards greater ROM to perform without difficulty.   Time 12   Period Weeks   Status Partially Met   OT LONG TERM GOAL #4   Title will improve supination of right hand by 20 degrees to help stabilize hand on rake for yardwork   Baseline Patient is now having to place hand on rake in a pronated position.   Time 12   Period Weeks   Status On-going   OT LONG TERM GOAL #5   Title Patient will improve right grip sufficient to stabilize garden tool in his hand to dig dirt for flowers    Baseline Beginning to show signs of weak grasp on right but unable to hold with enough strength to dig dirt.   Time 12   Period Weeks   Status On-going   Long Term Additional Goals   Additional Long Term Goals Yes   OT LONG TERM GOAL #6   Title Patient will complete folding of laundry using right hand as an assist during task    Baseline Patient continues to require assistance with task but is able to stabilize select pieces of laundry.   Time 12   Period Weeks   Status On-going               Plan - 10/24/14 1610    Clinical Impression Statement Patient showing signs of initiation of finger extension this date in all digits of the right hand.  Was able to demo briefly in the clinic for therapist.  He continues to make progress with ROM, strength and movement in his RUE to use as an assist and working towards improving quality of movement and produce more functional normal movement patterns.  He is still limited by spasticity in the right arm especially at the elbow.  Will continue to work towards neuro reeducation of the right arm for daily tasks.    Pt  will benefit from skilled therapeutic intervention in order to improve on the following deficits (Retired) Decreased coordination;Decreased range of motion;Impaired flexibility;Impaired tone;Impaired UE functional use;Pain;Decreased strength   Rehab Potential Good   Clinical Impairments Affecting Rehab Potential spasticity of the right side especially at the elbow for flexion   OT Frequency 2x / week   OT Duration 12 weeks   OT Treatment/Interventions Self-care/ADL training;Therapeutic exercise;Patient/family education;Neuromuscular education;Manual Therapy;Therapeutic exercises;Electrical Stimulation;Passive range of motion  OT Home Exercise Plan Patient has established HEP from previous sessions and will continue to add to plan   Consulted and Agree with Plan of Care Patient        Problem List Patient Active Problem List   Diagnosis Date Noted  . Shoulder subluxation, right 04/23/2014  . Spastic hemiplegia affecting dominant side 02/18/2014  . HTN (hypertension) 01/17/2014  . Dyslipidemia 01/17/2014  . CVA (cerebral infarction) 12/27/2013   Tahjanae Blankenburg T Tomasita Morrow, OTR/L, CLT Lacie Landry 10/24/2014, 4:22 PM  [place] MAIN [facility] SERVICES [address] [place], [place], [address] Phone: [phone]   Fax:  [phone]

## 2014-10-28 ENCOUNTER — Ambulatory Visit: Payer: BLUE CROSS/BLUE SHIELD | Admitting: Occupational Therapy

## 2014-10-28 DIAGNOSIS — IMO0002 Reserved for concepts with insufficient information to code with codable children: Secondary | ICD-10-CM

## 2014-10-28 DIAGNOSIS — G8191 Hemiplegia, unspecified affecting right dominant side: Secondary | ICD-10-CM | POA: Diagnosis not present

## 2014-10-28 DIAGNOSIS — M6281 Muscle weakness (generalized): Secondary | ICD-10-CM

## 2014-10-28 NOTE — Therapy (Signed)
[place] MAIN [facility] SERVICES [address] [place], [place], [address] Phone: [phone]   Fax:  [phone]   Occupational Therapy  Note   Patient Details  Name: [name] MRN: [id number] Date of Birth: 14-Dec-1955 Referring Provider:  Meredith Staggers, MD  Encounter Date: 10/28/2014      OT End of Session - 10/29/14 1522    Visit Number 18   Number of Visits 85   Date for OT Re-Evaluation 12/14/14   OT Start Time 1259   OT Stop Time 1354   OT Time Calculation (min) 55 min   Equipment Utilized During Treatment use of block for AAROM, ball    Activity Tolerance Patient tolerated treatment well;No increased pain   Behavior During Therapy [facility] for tasks assessed/performed      Past Medical History  Diagnosis Date  . Stroke     Past Surgical History  Procedure Laterality Date  . Knee arthroscopy Right     There were no vitals filed for this visit.  Visit Diagnosis:  No diagnosis found.      Subjective Assessment - 10/28/14 1330    Subjective  Patient reports he spent time with family this weekend in [place] and was showing his family what he was able to do now. They were impressed with his progress.  He was able to continue to replicate finger extension initiation.    Pertinent History Botox injections in right UE on Monday 09-23-14   Patient Stated Goals Patient wants to be independent with all self care and IADL tasks.   Currently in Pain? No/denies   Pain Score 0-No pain                      OT Treatments/Exercises (OP) - 10/28/14 1630    Neurological Re-education Exercises   Other Exercises 1 Patient seen for focus on AAROM for right UE from a supine position with therapist assist.  Focused on the following motions:  R shoulder flexion, ABD/ADD, ER, forearm supination, elbow extension, wrist extension and finger extension.  Light grasping acts with intentional focus on release of objects.  Utilized ball  for chest press with assist to hold unstable surface.  Large block used for shoulder flexion with assist to place right hand onto block with hand in a neutral position with assist to spread fingers into ABD for holding.  Cues and guiding from therapist to move RUE into above motions.  Prolonged stretching to wrist for extension as well as 6 wrist stretches to facilitate use into weight bearing activities. Finger extension trials with and without facilitation and cues for 3-5 reps each.                OT Education - 10/28/14 1355    Education provided Yes   Education Details Continue to focus on finger extension trials, weight bearing tasks with stretch to right wrist prior to weight bearing at home.  Continue to incorporate RUE into daily tasks at home.    Person(s) Educated Patient   Methods Explanation;Demonstration;Verbal cues;Tactile cues   Comprehension Verbalized understanding;Returned demonstration;Verbal cues required;Tactile cues required             OT Long Term Goals - 09/25/14 1659    OT LONG TERM GOAL #1   Title Patient will improve R UE function to be able to complete bilateral ADL   Baseline Patient is using RUE as a gross assist more in the last month, still  has difficulty with bilateral tasks.   Time 12   Period Weeks   Status On-going   OT LONG TERM GOAL #2   Title Patient will be able to utilize RUE as an assist with washing the hood of his car    Baseline Patient using RUE hand over hand for task but lacks wrist extension which limits task.   Time 12   Period Weeks   Status On-going   OT LONG TERM GOAL #3   Title Patient will demo right shoulder ABD sufficient to apply deodorant independently   Baseline Patient able to perform with minimal ABD but is working towards greater ROM to perform without difficulty.   Time 12   Period Weeks   Status Partially Met   OT LONG TERM GOAL #4   Title will improve supination of right hand by 20 degrees to help  stabilize hand on rake for yardwork   Baseline Patient is now having to place hand on rake in a pronated position.   Time 12   Period Weeks   Status On-going   OT LONG TERM GOAL #5   Title Patient will improve right grip sufficient to stabilize garden tool in his hand to dig dirt for flowers    Baseline Beginning to show signs of weak grasp on right but unable to hold with enough strength to dig dirt.   Time 12   Period Weeks   Status On-going   Long Term Additional Goals   Additional Long Term Goals Yes   OT LONG TERM GOAL #6   Title Patient will complete folding of laundry using right hand as an assist during task    Baseline Patient continues to require assistance with task but is able to stabilize select pieces of laundry.   Time 12   Period Weeks   Status On-going               Plan - 10/28/14 1355    Clinical Impression Statement Patient is able to consistently replicate initiation of finger extension on the right hand now for all digits.  Movement is small but consistent with 3-5 trials before fatigue for extension.  Patient continues to work towards incorporating hand into daily tasks and is consistently using as a gross assist.  He lacks strength with lateral pinch and continues to need work to facilitate release of objects.  Spasticity primarily located more in the elbow, wrist and hand.  Does demonstrate increased elbow extension during exercises.    Pt will benefit from skilled therapeutic intervention in order to improve on the following deficits (Retired) Decreased coordination;Decreased range of motion;Impaired flexibility;Impaired tone;Impaired UE functional use;Pain;Decreased strength   Rehab Potential Good   Clinical Impairments Affecting Rehab Potential spasticity of the right side especially at the elbow for flexion   OT Frequency 2x / week   OT Duration 12 weeks   OT Treatment/Interventions Self-care/ADL training;Therapeutic exercise;Patient/family  education;Neuromuscular education;Manual Therapy;Therapeutic exercises;Electrical Stimulation;Passive range of motion   OT Home Exercise Plan Patient has established HEP from previous sessions and will continue to add to plan        Problem List Patient Active Problem List   Diagnosis Date Noted  . Shoulder subluxation, right 04/23/2014  . Spastic hemiplegia affecting dominant side 02/18/2014  . HTN (hypertension) 01/17/2014  . Dyslipidemia 01/17/2014  . CVA (cerebral infarction) 12/27/2013   Kennon Encinas T Monterio Bob, OTR/L, CLT Harley Fitzwater 10/29/2014, 3:32 PM  [place] MAIN [facility] SERVICES [address]  [place], [place], [address] Phone: [phone]   Fax:  [phone]

## 2014-10-29 ENCOUNTER — Encounter: Payer: Self-pay | Admitting: Occupational Therapy

## 2014-10-30 ENCOUNTER — Ambulatory Visit: Payer: BLUE CROSS/BLUE SHIELD | Admitting: Occupational Therapy

## 2014-10-30 DIAGNOSIS — M6281 Muscle weakness (generalized): Secondary | ICD-10-CM

## 2014-10-30 DIAGNOSIS — G8191 Hemiplegia, unspecified affecting right dominant side: Secondary | ICD-10-CM | POA: Diagnosis not present

## 2014-10-30 DIAGNOSIS — IMO0002 Reserved for concepts with insufficient information to code with codable children: Secondary | ICD-10-CM

## 2014-10-31 ENCOUNTER — Encounter: Payer: Self-pay | Admitting: Occupational Therapy

## 2014-10-31 NOTE — Therapy (Signed)
[place] MAIN [facility] SERVICES [address] [place], [place], [address] Phone: [phone]   Fax:  [phone]  Occupational Therapy Treatment  Patient Details  Name: [name] MRN: [id number] Date of Birth: 05/01/1956 Referring Provider:  No ref. provider found  Encounter Date: 10/30/2014      OT End of Session - 10/31/14 1018    Visit Number [id number]   Number of Visits 85   Date for OT Re-Evaluation 12/14/14   OT Start Time 1102   OT Stop Time 1148   OT Time Calculation (min) 46 min   Activity Tolerance Patient tolerated treatment well;No increased pain   Behavior During Therapy [facility] for tasks assessed/performed      Past Medical History  Diagnosis Date  . Stroke     Past Surgical History  Procedure Laterality Date  . Knee arthroscopy Right     There were no vitals filed for this visit.  Visit Diagnosis:  Hemiplegia affecting right dominant side  Lack of coordination due to stroke  Muscle weakness      Subjective Assessment - 10/30/14 1100    Subjective  Patient reports he is doing well, no complaints this date and denies pain.  He reports he has continued doing exercises at home and working on progressing with finger extension on the right hand.    Pertinent History Botox injections in right UE on Monday 09-23-14   Patient Stated Goals Patient wants to be independent with all self care and IADL tasks.   Currently in Pain? No/denies   Pain Score 0-No pain                      OT Treatments/Exercises (OP) - 10/31/14 0001    Neurological Re-education Exercises   Other Exercises 1 Patient seen for brief passive ROM and assessment of movement prior to [facility] with faciliation of movement for right UE for shoulder flexion, diagonal patterns, ER, ABD/ADD, elbow extension, forearm supination, wrist extension and finger extension for 10 reps for 1-2 sets each.  Able to demo elbow extension through the whole range and  given mild resistance from therapist. Therapist utilizing tapping techniques on tricep to facilitate extension of the elbow.  Patient engaging in active assistive reach with therapist guiding and facilitation techniques along with tone inhibition.  Trunk dissociation and rotation for tone inhibition as well as prolonged stretch for trunk elongation.  Facilitation of wrist extension along with therapist performing wrist stretches to place right hand in a weight bearing position focusing on stabiliity of the hand with weight shifts.                 OT Education - 10/30/14 1148    Education provided Yes   Education Details HEP with right UE for ROM, strength and finger extension.   Person(s) Educated Patient   Methods Explanation;Demonstration;Verbal cues   Comprehension Verbal cues required;Returned demonstration;Verbalized understanding             OT Long Term Goals - 09/25/14 1659    OT LONG TERM GOAL #1   Title Patient will improve R UE function to be able to complete bilateral ADL   Baseline Patient is using RUE as a gross assist more in the last month, still has difficulty with bilateral tasks.   Time 12   Period Weeks   Status On-going   OT LONG TERM GOAL #2   Title Patient will be able to utilize RUE as an  assist with washing the hood of his car    Baseline Patient using RUE hand over hand for task but lacks wrist extension which limits task.   Time 12   Period Weeks   Status On-going   OT LONG TERM GOAL #3   Title Patient will demo right shoulder ABD sufficient to apply deodorant independently   Baseline Patient able to perform with minimal ABD but is working towards greater ROM to perform without difficulty.   Time 12   Period Weeks   Status Partially Met   OT LONG TERM GOAL #4   Title will improve supination of right hand by 20 degrees to help stabilize hand on rake for yardwork   Baseline Patient is now having to place hand on rake in a pronated position.    Time 12   Period Weeks   Status On-going   OT LONG TERM GOAL #5   Title Patient will improve right grip sufficient to stabilize garden tool in his hand to dig dirt for flowers    Baseline Beginning to show signs of weak grasp on right but unable to hold with enough strength to dig dirt.   Time 12   Period Weeks   Status On-going   Long Term Additional Goals   Additional Long Term Goals Yes   OT LONG TERM GOAL #6   Title Patient will complete folding of laundry using right hand as an assist during task    Baseline Patient continues to require assistance with task but is able to stabilize select pieces of laundry.   Time 12   Period Weeks   Status On-going               Plan - 10/31/14 1019    Clinical Impression Statement Patient continues to demo initiation of finger extension on the right but continues to fatigue quickly with trials.  He is using the right hand more in the clinic and at home with reaching for items and using as a support and stabilizer when attempting to perform functional tasks.  He continues to demo difficulty with release of objects at times and requires assistance from another person or uses the left hand for facilitating release.  Spasticity continues to limit the right elbow, wrist and hand however he has shown good progress since last Botox injection.   Pt will benefit from skilled therapeutic intervention in order to improve on the following deficits (Retired) Decreased coordination;Decreased range of motion;Impaired flexibility;Impaired tone;Impaired UE functional use;Pain;Decreased strength   Rehab Potential Good   Clinical Impairments Affecting Rehab Potential spasticity of the right side especially at the elbow for flexion   OT Frequency 2x / week   OT Duration 12 weeks   OT Treatment/Interventions Self-care/ADL training;Therapeutic exercise;Patient/family education;Neuromuscular education;Manual Therapy;Therapeutic exercises;Electrical  Stimulation;Passive range of motion   Plan Will reassess finger extension next session on the right side.   OT Home Exercise Plan Patient has established HEP from previous sessions and will continue to add to plan        Problem List Patient Active Problem List   Diagnosis Date Noted  . Shoulder subluxation, right 04/23/2014  . Spastic hemiplegia affecting dominant side 02/18/2014  . HTN (hypertension) 01/17/2014  . Dyslipidemia 01/17/2014  . CVA (cerebral infarction) 12/27/2013   Maurie Musco T Tomasita Morrow, OTR/L, CLT Cristo Ausburn 10/31/2014, 10:30 AM  [place] MAIN [facility] SERVICES [address] [place], [place], [address] Phone: [phone]   Fax:  [phone]

## 2014-11-05 ENCOUNTER — Encounter: Payer: BLUE CROSS/BLUE SHIELD | Attending: Physical Medicine & Rehabilitation

## 2014-11-05 ENCOUNTER — Encounter: Payer: Self-pay | Admitting: Physical Medicine & Rehabilitation

## 2014-11-05 ENCOUNTER — Ambulatory Visit (HOSPITAL_BASED_OUTPATIENT_CLINIC_OR_DEPARTMENT_OTHER): Payer: BLUE CROSS/BLUE SHIELD | Admitting: Physical Medicine & Rehabilitation

## 2014-11-05 VITALS — BP 153/89 | HR 88 | Resp 16

## 2014-11-05 DIAGNOSIS — Z7982 Long term (current) use of aspirin: Secondary | ICD-10-CM | POA: Insufficient documentation

## 2014-11-05 DIAGNOSIS — I69351 Hemiplegia and hemiparesis following cerebral infarction affecting right dominant side: Secondary | ICD-10-CM | POA: Insufficient documentation

## 2014-11-05 DIAGNOSIS — M25511 Pain in right shoulder: Secondary | ICD-10-CM | POA: Insufficient documentation

## 2014-11-05 DIAGNOSIS — G8110 Spastic hemiplegia affecting unspecified side: Secondary | ICD-10-CM

## 2014-11-05 NOTE — Progress Notes (Signed)
Subjective:    Patient ID: [name], male    DOB: 1956-02-03, 59 y.o.   MRN: [id number] 59 year old RH-male with history of HTN (no meds > 1 year) who was admitted to [facility] on 08/03/015 with two day history of fluctuating RUE with speed difficulty and CT head with recent left lenticulostriate infarct. He was started on ASA for treatment and blood pressure noted to be labile and treated with hydralazine. He had worsening of symptoms with dense right hemiparesis that evening and follow up CCT with left basal ganglia infarct and no significant change. Neurology (Dr. Loretha Brasil) consulted for input and felt that patient with worsening of symptoms likely due to hypotension. MRI brain with acute nonhemorrhagic infarct left lenticular nucleus and mid corona radiata and tiny infarct anterior left frontal opercular region.   He continues on ASA for secondary stroke prevention Admit date: 12/27/2013   Discharge date: 01/17/2014  HPI 09/23/14 Biceps200 FCR50 FCU25 FDS50 FDP50 PT 25   Pain Inventory Average Pain 1 Pain Right Now 0 My pain is tingling  In the last 24 hours, has pain interfered with the following? General activity 0 Relation with others 0 Enjoyment of life 0 What TIME of day is your pain at its worst? night Sleep (in general) Good  Pain is worse with: inactivity Pain improves with: therapy/exercise Relief from Meds: 7  Mobility walk without assistance use a cane how many minutes can you walk? 5 ability to climb steps?  yes do you drive?  yes  Function disabled: date disabled 12/2013 I need assistance with the following:  meal prep Do you have any goals in this area?  yes  Neuro/Psych No problems in this area  Prior Studies Any changes since last visit?  no  Physicians involved in your care Any changes since last visit?  no   Family History  Problem Relation Age of Onset  . Hypertension Mother    History   Social History  . Marital Status: Married   Spouse Name: N/A  . Number of Children: N/A  . Years of Education: N/A   Social History Main Topics  . Smoking status: Never Smoker   . Smokeless tobacco: Not on file  . Alcohol Use: No  . Drug Use: No  . Sexual Activity: Not on file   Other Topics Concern  . None   Social History Narrative   Past Surgical History  Procedure Laterality Date  . Knee arthroscopy Right    Past Medical History  Diagnosis Date  . Stroke    BP 153/89 mmHg  Pulse 88  Resp 16  SpO2 98%  Opioid Risk Score:   Fall Risk Score: Moderate Fall Risk (6-13 points) ( educated and given handout)`1  Depression screen PHQ 2/9  Depression screen PHQ 2/9 08/15/2014  Decreased Interest 0  Down, Depressed, Hopeless 0  PHQ - 2 Score 0  Altered sleeping 0  Tired, decreased energy 0  Change in appetite 0  Feeling bad or failure about yourself  0  Trouble concentrating 0  Moving slowly or fidgety/restless 0  Suicidal thoughts 0  PHQ-9 Score 0     Review of Systems  Neurological:       Tingling  All other systems reviewed and are negative.      Objective:   Physical Exam  Constitutional: He is oriented to person, place, and time. He appears well-developed and well-nourished.  HENT:  Head: Normocephalic and atraumatic.  Eyes: Conjunctivae and EOM are normal.  Pupils are equal, round, and reactive to light.  Neurological: He is alert and oriented to person, place, and time.  Trace right deltoid, bicep, finger flexor and extensor 3 minus right hip flexion, 4 minus right knee extension 5/5 in the left deltoid, bicep and tricep grip, grip hip flexor, knee extensor, ankle dorsiflexor Sensation intact to light touch in the right side Speech without evidence of any aphasia or dysarthria.  Psychiatric: He has a normal mood and affect.  Nursing note and vitals reviewed.         Assessment & Plan:  1. Right spastic hemiplegia due to CVA on 12/24/2013. He's had continuing improvement in terms of his  right arm function. Reduction of spasticity has allowed him to move his fingers more now. He had a maximum dose of Botox however he does have some residual severe pronator spasticity as well as wrist flexor spasticity. I would recommend that I'll wean dosages at next Botox injection FCR 50 FCU 50 FDS 50 FDP 50 PT 50 Brachioradialis 50 Biceps 100 units  If patient needsAdditional elbow flexor spasticity relief would recommend musculocutaneous nerve block, we will assess that after he follows up from the next Botox injection He will received the maximum Botox dosage.  Patient is continue make progress in outpatient OT for right upper extremity neuromuscular reeducation, I do believe the spasticity reduction is helping regained some movement which hopefully can be incorporated into his functional activities

## 2014-11-05 NOTE — Patient Instructions (Signed)
May need to do musculocutaneous nerve block

## 2014-11-06 ENCOUNTER — Ambulatory Visit: Payer: BLUE CROSS/BLUE SHIELD | Admitting: Occupational Therapy

## 2014-11-06 DIAGNOSIS — G8191 Hemiplegia, unspecified affecting right dominant side: Secondary | ICD-10-CM

## 2014-11-06 DIAGNOSIS — M6281 Muscle weakness (generalized): Secondary | ICD-10-CM

## 2014-11-06 DIAGNOSIS — IMO0002 Reserved for concepts with insufficient information to code with codable children: Secondary | ICD-10-CM

## 2014-11-07 ENCOUNTER — Encounter: Payer: Self-pay | Admitting: Occupational Therapy

## 2014-11-07 NOTE — Therapy (Signed)
[place] MAIN [facility] SERVICES [address] [place], [place], [address] Phone: [phone]   Fax:  [phone]  Occupational Therapy Treatment  Patient Details  Name: [name] MRN: [id number] Date of Birth: Aug 14, 1955 Referring Provider:  No ref. provider found  Encounter Date: 11/06/2014      OT End of Session - 11/06/14 1400    Visit Number 80   Number of Visits 85   Date for OT Re-Evaluation 12/14/14   OT Start Time 1310   OT Stop Time 1400   OT Time Calculation (min) 50 min   Activity Tolerance Patient tolerated treatment well;No increased pain   Behavior During Therapy [facility] for tasks assessed/performed      Past Medical History  Diagnosis Date  . Stroke     Past Surgical History  Procedure Laterality Date  . Knee arthroscopy Right     There were no vitals filed for this visit.  Visit Diagnosis:  Hemiplegia affecting right dominant side  Lack of coordination due to stroke  Muscle weakness      Subjective Assessment - 11/06/14 1345    Subjective  Patient reports he went to the doctor last week and the doctor was pleased with the intiation of finger movement and wants him to continue with therapy.  He may get another round of botox in a few weeks with focus  on the elbow and hand.     Pertinent History Botox injections in right UE on Monday 09-23-14   Patient Stated Goals Patient wants to be independent with all self care and IADL tasks.   Currently in Pain? No/denies   Pain Score 0-No pain                      OT Treatments/Exercises (OP) - 11/07/14 0001    Neurological Re-education Exercises   Scapular Stabilization Right;10 reps;Supine   Shoulder Flexion AAROM;Right;10 reps;Supine;Theraband   Shoulder ABduction AAROM;Right;10 reps;Supine   Shoulder External Rotation PROM;10 reps;Supine   Elbow Extension AROM;Strengthening;Right;20 reps;Supine   Forearm Supination PROM;AAROM;Right;10 reps;Supine    Wrist Extension PROM;AAROM;Right;10 reps;Supine;Seated   Other Exercises 1 Patient seen for RUE ROM and facilitation of movement to utilize in ADL tasks as an assist at this point. Supine AAROM for shoulder flexion for place and hold for 10 secs with therapist assist to guide arm and assist with placement. Cues for patient to control the arm with decent from flexion. Shoulder ABD/ADD, external rotation, forearm supination, elbow flexion/extension. ADD to reach across body to opposite shoulder and opposite hip with cues and facilitation at the elbow. Focused on control of arm with decent from flexion as well and side to side movements in a restricted plane of motion with therapist providing cues and parameters.    Other Weight-Bearing Exercises 1 Right wrist stretches to prepare the arm for weight bearing followed by weight bearing on outstretched hand with cues and assist from therapist.    Functional Reaching Activities   Low Level low level reaching with tone  inhibition techniques.                 OT Education - 11/06/14 1352    Education provided Yes   Education Details instructed on exercises for control of RUE to work on at home in addition to finger extension.   Person(s) Educated Patient   Methods Explanation;Demonstration;Verbal cues;Tactile cues   Comprehension Verbalized understanding;Returned demonstration;Tactile cues required;Verbal cues required  OT Long Term Goals - 09/25/14 1659    OT LONG TERM GOAL #1   Title Patient will improve R UE function to be able to complete bilateral ADL   Baseline Patient is using RUE as a gross assist more in the last month, still has difficulty with bilateral tasks.   Time 12   Period Weeks   Status On-going   OT LONG TERM GOAL #2   Title Patient will be able to utilize RUE as an assist with washing the hood of his car    Baseline Patient using RUE hand over hand for task but lacks wrist extension which limits task.    Time 12   Period Weeks   Status On-going   OT LONG TERM GOAL #3   Title Patient will demo right shoulder ABD sufficient to apply deodorant independently   Baseline Patient able to perform with minimal ABD but is working towards greater ROM to perform without difficulty.   Time 12   Period Weeks   Status Partially Met   OT LONG TERM GOAL #4   Title will improve supination of right hand by 20 degrees to help stabilize hand on rake for yardwork   Baseline Patient is now having to place hand on rake in a pronated position.   Time 12   Period Weeks   Status On-going   OT LONG TERM GOAL #5   Title Patient will improve right grip sufficient to stabilize garden tool in his hand to dig dirt for flowers    Baseline Beginning to show signs of weak grasp on right but unable to hold with enough strength to dig dirt.   Time 12   Period Weeks   Status On-going   Long Term Additional Goals   Additional Long Term Goals Yes   OT LONG TERM GOAL #6   Title Patient will complete folding of laundry using right hand as an assist during task    Baseline Patient continues to require assistance with task but is able to stabilize select pieces of laundry.   Time 12   Period Weeks   Status On-going               Plan - 11/06/14 1400    Clinical Impression Statement Patient pleased with progress and feels his neurologist was surprised at seeing some finger extension on the right hand during his visit.  He continues to progress with ROM, control and working towards functional use of the right hand and now using as an assist and stabilizer.  He continues to need focus on wrist extension and weight bearing tasks.    Pt will benefit from skilled therapeutic intervention in order to improve on the following deficits (Retired) Decreased coordination;Decreased range of motion;Impaired flexibility;Impaired tone;Impaired UE functional use;Pain;Decreased strength   Rehab Potential Good   Clinical Impairments  Affecting Rehab Potential spasticity of the right side especially at the elbow for flexion   OT Frequency 2x / week   OT Duration 12 weeks   OT Treatment/Interventions Self-care/ADL training;Therapeutic exercise;Patient/family education;Neuromuscular education;Manual Therapy;Therapeutic exercises;Electrical Stimulation;Passive range of motion   OT Home Exercise Plan Patient has established HEP from previous sessions and will continue to add to plan   Consulted and Agree with Plan of Care Patient        Problem List Patient Active Problem List   Diagnosis Date Noted  . Shoulder subluxation, right 04/23/2014  . Spastic hemiplegia affecting dominant side 02/18/2014  . HTN (hypertension) 01/17/2014  .  Dyslipidemia 01/17/2014  . CVA (cerebral infarction) 12/27/2013   Devani Odonnel T Tomasita Morrow, OTR/L, CLT Tauno Falotico 11/07/2014, 1:58 PM  [place] MAIN [facility] SERVICES [address] [place], [place], [address] Phone: [phone]   Fax:  [phone]

## 2014-11-08 ENCOUNTER — Encounter: Payer: Self-pay | Admitting: Occupational Therapy

## 2014-11-08 ENCOUNTER — Ambulatory Visit: Payer: BLUE CROSS/BLUE SHIELD | Admitting: Occupational Therapy

## 2014-11-08 DIAGNOSIS — G8191 Hemiplegia, unspecified affecting right dominant side: Secondary | ICD-10-CM | POA: Diagnosis not present

## 2014-11-08 DIAGNOSIS — M6281 Muscle weakness (generalized): Secondary | ICD-10-CM

## 2014-11-08 DIAGNOSIS — IMO0002 Reserved for concepts with insufficient information to code with codable children: Secondary | ICD-10-CM

## 2014-11-08 NOTE — Therapy (Signed)
[place] MAIN [facility] SERVICES [address] [place], [place], [address] Phone: [phone]   Fax:  [phone]  Occupational Therapy Treatment  Patient Details  Name: [name] MRN: [id number] Date of Birth: [birth year] Referring Provider:  No ref. provider found  Encounter Date: 11/08/2014      OT End of Session - 11/08/14 1516    Visit Number 30   Number of Visits 85   Date for OT Re-Evaluation 12/14/14   OT Start Time 1040   OT Stop Time 1130   OT Time Calculation (min) 50 min   Equipment Utilized During Treatment SAEBO ball tower   Activity Tolerance Patient tolerated treatment well;No increased pain   Behavior During Therapy [facility] for tasks assessed/performed      Past Medical History  Diagnosis Date  . Stroke     Past Surgical History  Procedure Laterality Date  . Knee arthroscopy Right     There were no vitals filed for this visit.  Visit Diagnosis:  Hemiplegia affecting right dominant side  Lack of coordination due to stroke  Muscle weakness      Subjective Assessment - 11/08/14 1514    Subjective  Patient reports he did not do any exercises yet today but did some yesterday.  No changes and no complaints noted.   Pertinent History Botox injections in right UE on Monday 09-23-14   Patient Stated Goals Patient wants to be independent with all self care and IADL tasks.   Currently in Pain? No/denies   Pain Score 0-No pain                      OT Treatments/Exercises (OP) - 11/08/14 0001    Neurological Re-education Exercises   Scapular Stabilization Right;10 reps;Supine   Shoulder Flexion AAROM;Right;10 reps;Supine;Theraband   Shoulder ABduction AAROM;Right;10 reps;Supine   Shoulder External Rotation PROM;10 reps;Supine   Elbow Extension AROM;Strengthening;Right;20 reps;Supine   Forearm Supination PROM;AAROM;Right;10 reps;Supine   Wrist Extension PROM;AAROM;Right;10 reps;Supine;Seated   Other  Exercises 1 Patient seen for shoulder stabilization exercises for shoulder protraction, retraction, control with place and hold, small circles in both directions, side to side movement all for 8-10 reps for 1-2 sets each.  Elbow extension with mild resistance for 10 reps, supination with passive prolonged stretch followed by facilitation of movement actively.  6 general wrist stretches to the right wrist and forearm to prepare for weight bearing positions.  Patient seen for active assistive reach with use of SAEBO tower with looped balls, therapist guiding right UE as well as working towards facilitation of holding and releasing of loops.   Other Weight-Bearing Exercises 1 Right wrist stretches to prepare the arm for weight bearing followed by weight bearing on outstretched hand with cues and assist from therapist.                 OT Education - 11/08/14 1515    Education provided Yes   Education Details Continued focus on HEP for shoulder stabilization exercises, forearm supination, wrist extension and finger extension.   Person(s) Educated Patient   Methods Explanation;Demonstration;Verbal cues   Comprehension Verbalized understanding;Returned demonstration;Verbal cues required             OT Long Term Goals - 09/25/14 1659    OT LONG TERM GOAL #1   Title Patient will improve R UE function to be able to complete bilateral ADL   Baseline Patient is using RUE as a gross assist more in the  last month, still has difficulty with bilateral tasks.   Time 12   Period Weeks   Status On-going   OT LONG TERM GOAL #2   Title Patient will be able to utilize RUE as an assist with washing the hood of his car    Baseline Patient using RUE hand over hand for task but lacks wrist extension which limits task.   Time 12   Period Weeks   Status On-going   OT LONG TERM GOAL #3   Title Patient will demo right shoulder ABD sufficient to apply deodorant independently   Baseline Patient able to  perform with minimal ABD but is working towards greater ROM to perform without difficulty.   Time 12   Period Weeks   Status Partially Met   OT LONG TERM GOAL #4   Title will improve supination of right hand by 20 degrees to help stabilize hand on rake for yardwork   Baseline Patient is now having to place hand on rake in a pronated position.   Time 12   Period Weeks   Status On-going   OT LONG TERM GOAL #5   Title Patient will improve right grip sufficient to stabilize garden tool in his hand to dig dirt for flowers    Baseline Beginning to show signs of weak grasp on right but unable to hold with enough strength to dig dirt.   Time 12   Period Weeks   Status On-going   Long Term Additional Goals   Additional Long Term Goals Yes   OT LONG TERM GOAL #6   Title Patient will complete folding of laundry using right hand as an assist during task    Baseline Patient continues to require assistance with task but is able to stabilize select pieces of laundry.   Time 12   Period Weeks   Status On-going               Plan - 11/08/14 1521    Clinical Impression Statement Patient required therapist guiding and assistance with reaching task in standing with use of SAEBO tower and looped balls.  Minimal grasp with hand and requires facilitation of forearm extensors for release at times. He continues to make progress and is becoming more consistent with trials of finger extension.     Pt will benefit from skilled therapeutic intervention in order to improve on the following deficits (Retired) Decreased coordination;Decreased range of motion;Impaired flexibility;Impaired tone;Impaired UE functional use;Pain;Decreased strength   Rehab Potential Good   Clinical Impairments Affecting Rehab Potential spasticity of the right side especially at the elbow for flexion   OT Frequency 2x / week   OT Duration 12 weeks   OT Treatment/Interventions Self-care/ADL training;Therapeutic  exercise;Patient/family education;Neuromuscular education;Manual Therapy;Therapeutic exercises;Electrical Stimulation;Passive range of motion   OT Home Exercise Plan Patient has established HEP from previous sessions and will continue to add to plan   Consulted and Agree with Plan of Care Patient        Problem List Patient Active Problem List   Diagnosis Date Noted  . Shoulder subluxation, right 04/23/2014  . Spastic hemiplegia affecting dominant side 02/18/2014  . HTN (hypertension) 01/17/2014  . Dyslipidemia 01/17/2014  . CVA (cerebral infarction) 12/27/2013   Amy T Tomasita Morrow, OTR/L, CLT  Lovett,Amy 11/08/2014, 3:23 PM  [place] MAIN [facility] SERVICES [address] [place], [place], [address] Phone: [phone]   Fax:  [phone]

## 2014-11-12 ENCOUNTER — Encounter: Payer: Self-pay | Admitting: Occupational Therapy

## 2014-11-12 ENCOUNTER — Ambulatory Visit: Payer: BLUE CROSS/BLUE SHIELD | Admitting: Occupational Therapy

## 2014-11-12 DIAGNOSIS — G8191 Hemiplegia, unspecified affecting right dominant side: Secondary | ICD-10-CM | POA: Diagnosis not present

## 2014-11-12 DIAGNOSIS — M6281 Muscle weakness (generalized): Secondary | ICD-10-CM

## 2014-11-12 DIAGNOSIS — IMO0002 Reserved for concepts with insufficient information to code with codable children: Secondary | ICD-10-CM

## 2014-11-13 ENCOUNTER — Ambulatory Visit: Payer: BLUE CROSS/BLUE SHIELD | Admitting: Occupational Therapy

## 2014-11-13 ENCOUNTER — Encounter: Payer: Self-pay | Admitting: Occupational Therapy

## 2014-11-13 DIAGNOSIS — M6281 Muscle weakness (generalized): Secondary | ICD-10-CM

## 2014-11-13 DIAGNOSIS — IMO0002 Reserved for concepts with insufficient information to code with codable children: Secondary | ICD-10-CM

## 2014-11-13 DIAGNOSIS — G8191 Hemiplegia, unspecified affecting right dominant side: Secondary | ICD-10-CM | POA: Diagnosis not present

## 2014-11-13 NOTE — Therapy (Signed)
[place] MAIN [facility] SERVICES [address] [place], [place], [address] Phone: [phone]   Fax:  [phone]  Occupational Therapy Treatment  Patient Details  Name: [name] MRN: [id number] Date of Birth: 03-07-1956 Referring Provider:  No ref. provider found  Encounter Date: 11/12/2014      OT End of Session - 11/12/14 1709    Visit Number 75   Number of Visits 85   Date for OT Re-Evaluation 12/14/14   OT Start Time 1300   OT Stop Time 1350   OT Time Calculation (min) 50 min   Activity Tolerance Patient tolerated treatment well;No increased pain   Behavior During Therapy [facility] for tasks assessed/performed      Past Medical History  Diagnosis Date  . Stroke     Past Surgical History  Procedure Laterality Date  . Knee arthroscopy Right     There were no vitals filed for this visit.  Visit Diagnosis:  Hemiplegia affecting right dominant side  Lack of coordination due to stroke  Muscle weakness      Subjective Assessment - 11/12/14 1708    Subjective  Patient reports he was able to do some exercise and stretching yesterday and today, reports he has been working on supination of the forearm with passive stretch for 150 reps a day total.  He reports he feels like the forearm is looser and doing better.  Reports he can let his arm hang more freely at his side when he walks.  Had a good father's day.   Pertinent History Botox injections in right UE on Monday 09-23-14   Patient Stated Goals Patient wants to be independent with all self care and IADL tasks.   Pain Score 0-No pain                      OT Treatments/Exercises (OP) - 11/12/14 1708    ADLs   Grooming Patient seen for reaching and obtaining ADL items such as pill bottle, toothbrush with built up handle and ball.  Verbal and tactile cues required as well as facilitation of movement with tapping of extensors for release of objects.    Neurological  Re-education Exercises   Scapular Stabilization Left;10 reps;Supine   Shoulder Flexion PROM;AAROM;Right;20 reps;Supine   Shoulder ABduction PROM;AAROM;Right;20 reps;Supine   Shoulder External Rotation PROM;AAROM;Right;20 reps;Supine   Elbow Extension AROM;Right;20 reps   Forearm Supination PROM;AAROM;Right;20 reps;Supine;Seated   Wrist Extension PROM;AAROM;Right;10 reps;Seated   Other Exercises 1 Patient seen for RUE ROM and facilitation of movement to utilize in ADL tasks as an assist at this point. Supine AAROM for shoulder flexion for place and hold for up to 30 secs with therapist assist to guide arm and assist with placement. Cues for patient to control the arm with decent from flexion. Shoulder ABD/ADD, external rotation, forearm supination, elbow flexion/extension. ADD to reach across body to opposite shoulder and opposite hip with cues and facilitation at the elbow. Sitting, patient seen for forward flexion with reach between legs towards floor to pick up item using lateral pinch on the right.    Other Weight-Bearing Exercises 1 weight bearing to right UE with therapist assist for placement of the arm and hand as well as facilitation of weight shifting to produce weight bearing.   Functional Reaching Activities   Low Level low level reaching tasks in sitting with therapist assist to guide and facilitate finger extension for reach and grasp as well as release of objects.  OT Education - 11/12/14 1709    Education provided Yes   Education Details HEP for supination, ER and wrist extension   Person(s) Educated Patient   Methods Explanation;Demonstration;Verbal cues   Comprehension Verbal cues required;Returned demonstration;Verbalized understanding             OT Long Term Goals - 09/25/14 1659    OT LONG TERM GOAL #1   Title Patient will improve R UE function to be able to complete bilateral ADL   Baseline Patient is using RUE as a gross assist more in the  last month, still has difficulty with bilateral tasks.   Time 12   Period Weeks   Status On-going   OT LONG TERM GOAL #2   Title Patient will be able to utilize RUE as an assist with washing the hood of his car    Baseline Patient using RUE hand over hand for task but lacks wrist extension which limits task.   Time 12   Period Weeks   Status On-going   OT LONG TERM GOAL #3   Title Patient will demo right shoulder ABD sufficient to apply deodorant independently   Baseline Patient able to perform with minimal ABD but is working towards greater ROM to perform without difficulty.   Time 12   Period Weeks   Status Partially Met   OT LONG TERM GOAL #4   Title will improve supination of right hand by 20 degrees to help stabilize hand on rake for yardwork   Baseline Patient is now having to place hand on rake in a pronated position.   Time 12   Period Weeks   Status On-going   OT LONG TERM GOAL #5   Title Patient will improve right grip sufficient to stabilize garden tool in his hand to dig dirt for flowers    Baseline Beginning to show signs of weak grasp on right but unable to hold with enough strength to dig dirt.   Time 12   Period Weeks   Status On-going   Long Term Additional Goals   Additional Long Term Goals Yes   OT LONG TERM GOAL #6   Title Patient will complete folding of laundry using right hand as an assist during task    Baseline Patient continues to require assistance with task but is able to stabilize select pieces of laundry.   Time 12   Period Weeks   Status On-going               Plan - 11/12/14 1710    Clinical Impression Statement Patient appears to have increased mobility of the right wrist and forearm this week with passive ROM.  Still able to demonstrate initiation of finger extension on the right hand but fatigues quickly with reps in the clinic.  He continues to be proactive at home with his exercises and attempting to incorporate his right hand into  his daily functional tasks.  Patient asking about the tendons of the hand and educated with use of hand anatomy poster for better visual understanding.  Will continue to work towards improving right hand use to improve independence in daily tasks at home and in the community.   Pt will benefit from skilled therapeutic intervention in order to improve on the following deficits (Retired) Decreased coordination;Decreased range of motion;Impaired flexibility;Impaired tone;Impaired UE functional use;Pain;Decreased strength   Rehab Potential Good   Clinical Impairments Affecting Rehab Potential spasticity of the right side especially at the elbow for flexion  OT Frequency 2x / week   OT Duration 12 weeks   OT Treatment/Interventions Self-care/ADL training;Therapeutic exercise;Patient/family education;Neuromuscular education;Manual Therapy;Therapeutic exercises;Electrical Stimulation;Passive range of motion   OT Home Exercise Plan Patient has established HEP from previous sessions and will continue to add to plan   Consulted and Agree with Plan of Care Patient        Problem List Patient Active Problem List   Diagnosis Date Noted  . Shoulder subluxation, right 04/23/2014  . Spastic hemiplegia affecting dominant side 02/18/2014  . HTN (hypertension) 01/17/2014  . Dyslipidemia 01/17/2014  . CVA (cerebral infarction) 12/27/2013   Amy T Tomasita Morrow, OTR/L, CLT Lovett,Amy 11/13/2014, 11:16 AM  [place] MAIN [facility] SERVICES [address] [place], [place], [address] Phone: [phone]   Fax:  [phone]

## 2014-11-14 NOTE — Therapy (Signed)
[place] [facility] MAIN REHAB SERVICES [address] [place], [place], [address] Phone: [phone]   Fax:  [phone]  Occupational Therapy Treatment  Patient Details  Name: [name] MRN: [id number] Date of Birth: 09/07/1955 Referring Provider:  No ref. provider found  Encounter Date: 11/13/2014      OT End of Session - 11/13/14 1637    Visit Number 76   Number of Visits 85   Date for OT Re-Evaluation 12/14/14   OT Start Time 1258   OT Stop Time 1345   OT Time Calculation (min) 47 min   Activity Tolerance Patient tolerated treatment well;No increased pain   Behavior During Therapy [facility] for tasks assessed/performed      Past Medical History  Diagnosis Date  . Stroke     Past Surgical History  Procedure Laterality Date  . Knee arthroscopy Right     There were no vitals filed for this visit.  Visit Diagnosis:  Hemiplegia affecting right dominant side  Lack of coordination due to stroke  Muscle weakness      Subjective Assessment - 11/13/14 1636    Subjective  Patient reports he has been busy with exercises and doing things around the house.  Patient continuing to work on supination of the right forearm.  Reports increased finger extension for 2-3 trials if hand is not fatigued.    Pertinent History Botox injections in right UE on Monday 09-23-14   Patient Stated Goals Patient wants to be independent with all self care and IADL tasks.   Currently in Pain? No/denies   Pain Score 0-No pain                      OT Treatments/Exercises (OP) - 11/13/14 1637    Neurological Re-education Exercises   Elbow Extension AAROM;Strengthening;Right;20 reps   Forearm Supination PROM;AAROM;Right;15 reps   Wrist Extension PROM;AAROM;Right;15 reps   Other Exercises 1 Patient seen for RUE neuro re education with facilitation techniques this date in supine, sitting and standing.  Focused on shoulder stabilization with place and hold  for 2 sets of 5 reps for up to 30 secs each with shoulder in 90 degrees of shoulder flexion.  Patient also seen for diagonal patterns in supine with therapist guiding and verbal cues.  PROM for ER with prolonged stretch as well as supination of the forearm.  AAROM with forearm supination with tapping and facilitation techniques.  Sitting attempted dowel exercises for shoulder flexion and chest press with minimal guiding and facilitation with tapping of extensors.   Started this session with finger extension on the right since patient tends to fatigue towards end of session.  Able to complete multiple reps with cues and facilitation from therapist.                OT Education - 11/13/14 1637    Education provided Yes   Person(s) Educated Patient   Methods Explanation;Demonstration;Verbal cues   Comprehension Verbal cues required;Returned demonstration;Verbalized understanding             OT Long Term Goals - 09/25/14 1659    OT LONG TERM GOAL #1   Title Patient will improve R UE function to be able to complete bilateral ADL   Baseline Patient is using RUE as a gross assist more in the last month, still has difficulty with bilateral tasks.   Time 12   Period Weeks   Status On-going   OT LONG TERM GOAL #2     Title Patient will be able to utilize RUE as an assist with washing the hood of his car    Baseline Patient using RUE hand over hand for task but lacks wrist extension which limits task.   Time 12   Period Weeks   Status On-going   OT LONG TERM GOAL #3   Title Patient will demo right shoulder ABD sufficient to apply deodorant independently   Baseline Patient able to perform with minimal ABD but is working towards greater ROM to perform without difficulty.   Time 12   Period Weeks   Status Partially Met   OT LONG TERM GOAL #4   Title will improve supination of right hand by 20 degrees to help stabilize hand on rake for yardwork   Baseline Patient is now having to place  hand on rake in a pronated position.   Time 12   Period Weeks   Status On-going   OT LONG TERM GOAL #5   Title Patient will improve right grip sufficient to stabilize garden tool in his hand to dig dirt for flowers    Baseline Beginning to show signs of weak grasp on right but unable to hold with enough strength to dig dirt.   Time 12   Period Weeks   Status On-going   Long Term Additional Goals   Additional Long Term Goals Yes   OT LONG TERM GOAL #6   Title Patient will complete folding of laundry using right hand as an assist during task    Baseline Patient continues to require assistance with task but is able to stabilize select pieces of laundry.   Time 12   Period Weeks   Status On-going               Plan - 11/13/14 1638    Clinical Impression Statement Continued focus on left UE neuro reeducation.  Patient's shoulder has responded well, also good elbow extension with resistance over the past few sessions, still has limited active movement in the right wrist, forearm and hand.  Patient responds to facilitation techniques for these areas and is able to show initiation of movement but quickly fatigues.  PROM has improved with patient performing consistently at home.   Pt will benefit from skilled therapeutic intervention in order to improve on the following deficits (Retired) Decreased coordination;Decreased range of motion;Impaired flexibility;Impaired tone;Impaired UE functional use;Pain;Decreased strength   Rehab Potential Good   Clinical Impairments Affecting Rehab Potential spasticity of the right side especially at the elbow for flexion   OT Frequency 2x / week   OT Duration 12 weeks   OT Treatment/Interventions Self-care/ADL training;Therapeutic exercise;Patient/family education;Neuromuscular education;Manual Therapy;Therapeutic exercises;Electrical Stimulation;Passive range of motion   OT Home Exercise Plan Patient has established HEP from previous sessions and will  continue to add to plan   Consulted and Agree with Plan of Care Patient        Problem List Patient Active Problem List   Diagnosis Date Noted  . Shoulder subluxation, right 04/23/2014  . Spastic hemiplegia affecting dominant side 02/18/2014  . HTN (hypertension) 01/17/2014  . Dyslipidemia 01/17/2014  . CVA (cerebral infarction) 12/27/2013   Allison Deshotels T Tomasita Morrow, OTR/L, CLT Giani Winther 11/14/2014, 2:44 PM  [place] MAIN [facility] SERVICES [address] [place], [place], [address] Phone: [phone]   Fax:  [phone]

## 2014-11-19 ENCOUNTER — Ambulatory Visit: Payer: BLUE CROSS/BLUE SHIELD | Admitting: Occupational Therapy

## 2014-11-19 DIAGNOSIS — G8191 Hemiplegia, unspecified affecting right dominant side: Secondary | ICD-10-CM

## 2014-11-19 DIAGNOSIS — M6281 Muscle weakness (generalized): Secondary | ICD-10-CM

## 2014-11-19 DIAGNOSIS — IMO0002 Reserved for concepts with insufficient information to code with codable children: Secondary | ICD-10-CM

## 2014-11-19 NOTE — Therapy (Signed)
[place] MAIN [facility] SERVICES [address] [place], [place], [address] Phone: [phone]   Fax:  [phone]  Occupational Therapy Treatment  Patient Details  Name: [name] MRN: [id number] Date of Birth: Aug 19, 1955 Referring Provider:  No ref. provider found  Encounter Date: 11/19/2014      OT End of Session - 11/19/14 1641    Visit Number 21   Number of Visits 85   Date for OT Re-Evaluation 12/14/14   OT Start Time 1300   OT Stop Time 1350   OT Time Calculation (min) 50 min   Activity Tolerance Patient tolerated treatment well;No increased pain   Behavior During Therapy [facility] for tasks assessed/performed      Past Medical History  Diagnosis Date  . Stroke     Past Surgical History  Procedure Laterality Date  . Knee arthroscopy Right     There were no vitals filed for this visit.  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  He reports he was also trying to rake but is still having difficulty with holding the rake using supinated forearm.   Pertinent History Botox injections in right UE on Monday 09-23-14, plans to have another round at the end of July   Patient Stated Goals Patient wants to be independent with all self care and IADL tasks.   Currently in Pain? No/denies   Pain Score 0-No pain                      OT Treatments/Exercises (OP) - 11/19/14 1640    Neurological Re-education Exercises   Scapular Stabilization Right;15 reps   Shoulder Flexion PROM;AAROM;Right;20 reps;Supine;Seated   Shoulder ABduction PROM;AAROM;Right;20 reps;Supine   Shoulder External Rotation  PROM;AAROM;Right;20 reps   Elbow Extension AAROM;PROM;Right;20 reps   Forearm Supination PROM;AAROM;Right;20 reps;Supine;Seated   Wrist Extension PROM;Right;20 reps;Seated;Standing   Other Exercises 1 Patient seen for RUE ROM and facilitation of movement to utilize in ADL tasks as an assist at this point. Supine AAROM for shoulder flexion for place and hold for 10 secs with therapist assist to guide arm and assist with placement. Cues for patient to control the arm with decent from flexion. Shoulder ABD/ADD, external rotation, forearm supination, elbow flexion/extension. ADD to reach across body to opposite shoulder and opposite hip with cues and facilitation at the elbow  Place and hold exercises for up to 1 minute for 5 repetitions with cues for control with descent.  Patient engaging in resistive elbow flexion with cues on how to perform in sitting self directed.  Finger extension trials with cues and facilitation of movement, difficulty with thumb movement in any direction beside flexion.  Weight bearing tasks in standing utilzing air disc on the table with therapist assist to position arm in relation to task.                 OT Education - 11/19/14 1641    Education provided Yes   Education Details HEP for focusing on resistive elbow extension and finger extension.   Person(s) Educated Patient   Methods Explanation;Demonstration;Verbal cues   Comprehension Verbal cues  required;Returned demonstration;Verbalized understanding             OT Long Term Goals - 09/25/14 1659    OT LONG TERM GOAL #1   Title Patient will improve R UE function to be able to complete bilateral ADL   Baseline Patient is using RUE as a gross assist more in the last month, still has difficulty with bilateral tasks.   Time 12   Period Weeks   Status On-going   OT LONG TERM GOAL #2   Title Patient will be able to utilize RUE as an assist with washing the hood of his car    Baseline Patient using RUE  hand over hand for task but lacks wrist extension which limits task.   Time 12   Period Weeks   Status On-going   OT LONG TERM GOAL #3   Title Patient will demo right shoulder ABD sufficient to apply deodorant independently   Baseline Patient able to perform with minimal ABD but is working towards greater ROM to perform without difficulty.   Time 12   Period Weeks   Status Partially Met   OT LONG TERM GOAL #4   Title will improve supination of right hand by 20 degrees to help stabilize hand on rake for yardwork   Baseline Patient is now having to place hand on rake in a pronated position.   Time 12   Period Weeks   Status On-going   OT LONG TERM GOAL #5   Title Patient will improve right grip sufficient to stabilize garden tool in his hand to dig dirt for flowers    Baseline Beginning to show signs of weak grasp on right but unable to hold with enough strength to dig dirt.   Time 12   Period Weeks   Status On-going   Long Term Additional Goals   Additional Long Term Goals Yes   OT LONG TERM GOAL #6   Title Patient will complete folding of laundry using right hand as an assist during task    Baseline Patient continues to require assistance with task but is able to stabilize select pieces of laundry.   Time 12   Period Weeks   Status On-going               Plan - 11/19/14 1642    Clinical Impression Statement Added exercises for self directed elbow extension this date in sitting and patient able to demo.  Encouraged finger extension trials but recommended patient be aware of fingers when they start to show signs of flexion and to work on weight bearing in between sets and work more on extension than flexion.  Patient continues to respond well and has shown progress at the elbow for more control of elbow extension in sitting and standing.     Pt will benefit from skilled therapeutic intervention in order to improve on the following deficits (Retired) Decreased  coordination;Decreased range of motion;Impaired flexibility;Impaired tone;Impaired UE functional use;Pain;Decreased strength   Rehab Potential Good   Clinical Impairments Affecting Rehab Potential spasticity of the right side especially at the elbow for flexion   OT Frequency 2x / week   OT Duration 12 weeks   OT Treatment/Interventions Self-care/ADL training;Therapeutic exercise;Patient/family education;Neuromuscular education;Manual Therapy;Therapeutic exercises;Electrical Stimulation;Passive range of motion   OT Home Exercise Plan Patient has established HEP from previous sessions and will continue to add to plan   Consulted and Agree with Plan of Care Patient        Problem  List Patient Active Problem List   Diagnosis Date Noted  . Shoulder subluxation, right 04/23/2014  . Spastic hemiplegia affecting dominant side 02/18/2014  . HTN (hypertension) 01/17/2014  . Dyslipidemia 01/17/2014  . CVA (cerebral infarction) 12/27/2013   [name], OTR/L, CLT Lovett,Thomas 11/19/2014, 4:50 PM  [place] MAIN [facility] SERVICES [address] [place], [place], [address] Phone: [phone]   Fax:  [phone]

## 2014-11-20 ENCOUNTER — Ambulatory Visit: Payer: BLUE CROSS/BLUE SHIELD | Admitting: Occupational Therapy

## 2014-11-20 DIAGNOSIS — G8191 Hemiplegia, unspecified affecting right dominant side: Secondary | ICD-10-CM

## 2014-11-20 DIAGNOSIS — M6281 Muscle weakness (generalized): Secondary | ICD-10-CM

## 2014-11-20 DIAGNOSIS — IMO0002 Reserved for concepts with insufficient information to code with codable children: Secondary | ICD-10-CM

## 2014-11-21 NOTE — Therapy (Signed)
[place] MAIN [facility] SERVICES [address] [place], [place], [address] Phone: [phone]   Fax:  [phone]  Occupational Therapy Treatment  Patient Details  Name: [name] MRN: [id number] Date of Birth: 09/18/1955 Referring Provider:  No ref. provider found  Encounter Date: 11/20/2014      OT End of Session - 11/20/14 1718    Visit Number 45   Number of Visits 85   Date for OT Re-Evaluation 12/14/14   OT Start Time 1300   OT Stop Time 1349   OT Time Calculation (min) 49 min   Activity Tolerance Patient tolerated treatment well;No increased pain   Behavior During Therapy [facility] for tasks assessed/performed      Past Medical History  Diagnosis Date  . Stroke     Past Surgical History  Procedure Laterality Date  . Knee arthroscopy Right     There were no vitals filed for this visit.  Visit Diagnosis:  Hemiplegia affecting right dominant side  Lack of coordination due to stroke  Muscle weakness      Subjective Assessment - 11/20/14 1717    Subjective  No complaints this date, been working on exercises.     Pertinent History Botox injections in right UE on Monday 09-23-14, plans to have another round at the end of July   Patient Stated Goals Patient wants to be independent with all self care and IADL tasks.   Currently in Pain? No/denies   Pain Score 0-No pain   Multiple Pain Sites No                      OT Treatments/Exercises (OP) - 11/20/14 1717    ADLs   Home Maintenance rake, attempts for forearm supination with repeated trials and stretching.   Neurological Re-education Exercises   Scapular Stabilization Right;20 reps;Supine   Other Exercises 1 Patient seen for RUE neuromuscular re education training with activities performed in supine, sitting and standing.  AAROM to RUE for shoulder flexion, ABD/ADD, ER with prolonged stretch, elbow extension with and without therapist resistance, supination of  the forearm with prolonged stretch therapist directed, wrist extension, stretches to carpals of the hand as well as digits to help promote active range for functional use.  Patient engaging in a variety of shoulder stabilization exercises on the right with therapist guiding and providing verbal and tactile cues.  Patient seen for a variety of gripping acts involving tool use but with extended focus on finger extension.  AROM trials of finger extension along with trials using facilitation techniques.                 OT Education - 11/20/14 1718    Education provided Yes   Education Details Positioning for functional use, hand grips on tools, HEP   Person(s) Educated Patient   Methods Explanation;Demonstration;Verbal cues   Comprehension Verbal cues required;Returned demonstration;Verbalized understanding             OT Long Term Goals - 09/25/14 1659    OT LONG TERM GOAL #1   Title Patient will improve R UE function to be able to complete bilateral ADL   Baseline Patient is using RUE as a gross assist more in the last month, still has difficulty with bilateral tasks.   Time 12   Period Weeks   Status On-going   OT LONG TERM GOAL #2   Title Patient will be able to utilize RUE as an assist with washing  the hood of his car    Baseline Patient using RUE hand over hand for task but lacks wrist extension which limits task.   Time 12   Period Weeks   Status On-going   OT LONG TERM GOAL #3   Title Patient will demo right shoulder ABD sufficient to apply deodorant independently   Baseline Patient able to perform with minimal ABD but is working towards greater ROM to perform without difficulty.   Time 12   Period Weeks   Status Partially Met   OT LONG TERM GOAL #4   Title will improve supination of right hand by 20 degrees to help stabilize hand on rake for yardwork   Baseline Patient is now having to place hand on rake in a pronated position.   Time 12   Period Weeks   Status  On-going   OT LONG TERM GOAL #5   Title Patient will improve right grip sufficient to stabilize garden tool in his hand to dig dirt for flowers    Baseline Beginning to show signs of weak grasp on right but unable to hold with enough strength to dig dirt.   Time 12   Period Weeks   Status On-going   Long Term Additional Goals   Additional Long Term Goals Yes   OT LONG TERM GOAL #6   Title Patient will complete folding of laundry using right hand as an assist during task    Baseline Patient continues to require assistance with task but is able to stabilize select pieces of laundry.   Time 12   Period Weeks   Status On-going               Plan - 11/20/14 1718    Clinical Impression Statement Patient continues to work towards improving right UE use for daily activities, unilateral and bilateral tasks, some as a gross assist and others with purposeful hand and arm use.  Patient appears to carryover exercises and trials into home activities on a consistent basis.  He has shown a recent increase in finger extension on the right hand which is promising.  He is pleased with his progress but would like to use the hand more for specific acts.    Pt will benefit from skilled therapeutic intervention in order to improve on the following deficits (Retired) Decreased coordination;Decreased range of motion;Impaired flexibility;Impaired tone;Impaired UE functional use;Pain;Decreased strength   Rehab Potential Good   Clinical Impairments Affecting Rehab Potential spasticity of the right side especially at the elbow for flexion   OT Frequency 2x / week   OT Duration 12 weeks   OT Treatment/Interventions Self-care/ADL training;Therapeutic exercise;Patient/family education;Neuromuscular education;Manual Therapy;Therapeutic exercises;Electrical Stimulation;Passive range of motion   OT Home Exercise Plan Patient has established HEP from previous sessions and will continue to add to plan   Consulted and  Agree with Plan of Care Patient        Problem List Patient Active Problem List   Diagnosis Date Noted  . Shoulder subluxation, right 04/23/2014  . Spastic hemiplegia affecting dominant side 02/18/2014  . HTN (hypertension) 01/17/2014  . Dyslipidemia 01/17/2014  . CVA (cerebral infarction) 12/27/2013   Amy T Tomasita Morrow, OTR/L, CLT Lovett,Amy 11/21/2014, 3:30 PM  [place] MAIN [facility] SERVICES [address] [place], [place], [address] Phone: [phone]   Fax:  [phone]

## 2014-11-26 ENCOUNTER — Ambulatory Visit: Payer: BLUE CROSS/BLUE SHIELD | Attending: Physical Medicine & Rehabilitation | Admitting: Occupational Therapy

## 2014-11-26 ENCOUNTER — Encounter: Payer: Self-pay | Admitting: Occupational Therapy

## 2014-11-26 DIAGNOSIS — I69351 Hemiplegia and hemiparesis following cerebral infarction affecting right dominant side: Secondary | ICD-10-CM | POA: Diagnosis not present

## 2014-11-26 DIAGNOSIS — M6281 Muscle weakness (generalized): Secondary | ICD-10-CM

## 2014-11-26 DIAGNOSIS — R279 Unspecified lack of coordination: Secondary | ICD-10-CM | POA: Insufficient documentation

## 2014-11-26 DIAGNOSIS — IMO0002 Reserved for concepts with insufficient information to code with codable children: Secondary | ICD-10-CM

## 2014-11-26 DIAGNOSIS — G8191 Hemiplegia, unspecified affecting right dominant side: Secondary | ICD-10-CM

## 2014-11-26 NOTE — Therapy (Signed)
[place] MAIN [facility] SERVICES [address] [place], [place], [address] Phone: [phone]   Fax:  [phone]  Occupational Therapy Treatment  Patient Details  Name: [name] MRN: [id number] Date of Birth: 05-Feb-1956 Referring Provider:  Meredith Staggers, MD  Encounter Date: 11/26/2014      OT End of Session - 11/26/14 1609    Visit Number 83   Number of Visits 85   Date for OT Re-Evaluation 12/14/14   OT Start Time 1301   OT Stop Time 1350   OT Time Calculation (min) 49 min   Activity Tolerance Patient tolerated treatment well;No increased pain   Behavior During Therapy [facility] for tasks assessed/performed      Past Medical History  Diagnosis Date  . Stroke     Past Surgical History  Procedure Laterality Date  . Knee arthroscopy Right     There were no vitals filed for this visit.  Visit Diagnosis:  Hemiplegia affecting right dominant side  Lack of coordination due to stroke  Muscle weakness      Subjective Assessment - 11/26/14 1601    Subjective  Patient reports he felt the weekend was really long, went to [place] to visit with his step daughter.  He walked several flights of steps, did some exercises.  Went to visit a friend yesterday for the 4th.  No complaints this date.   Pertinent History Botox injections in right UE on Monday 09-23-14, plans to have another round at the end of July   Patient Stated Goals Patient wants to be independent with all self care and IADL tasks.   Currently in Pain? No/denies   Pain Score 0-No pain                      OT Treatments/Exercises (OP) - 11/26/14 0001    Neurological Re-education Exercises   Other Exercises 2 Patient seen this date for facilitation of movement in supine and sitting for RUE as follows:  AAROM for shoulder flexion to 130 degrees with cues to control arm on the decent and to focus on elbow extension while performing exercises.  Patient completing  place and hold exercises with shoulder at 90 degrees of flexion for up to a minute for 2 trials and then for 5 trials of 30 sec hold.  Patient able to complete elbow extension with mild resistance from therapist and cues.  PROM for supination of the forearm followed by AAROM with facilitation.  Dowel exercises for shoulder flexion, chest press, elbow flexion/extension, forwards circles and ABD/ADD for 10-12 reps for 1 set each.  Sitting acts included reaching in multi directions with guiding from therapist and use of tone inhbition techniques for low reaching towards the floor, minimal guiding required for reaching towards the lateral side of the right leg, no guiding with medial side of the leg.  Finger extension trials with and without facilitation.                 OT Education - 11/26/14 1608    Education provided Yes   Education Details HEP, finger extension trials, place and hold, low reaching tasks.   Person(s) Educated Patient   Methods Explanation;Demonstration;Verbal cues;Tactile cues   Comprehension Returned demonstration;Verbal cues required;Verbalized understanding;Tactile cues required             OT Long Term Goals - 09/25/14 1659    OT LONG TERM GOAL #1   Title Patient will improve R UE function  to be able to complete bilateral ADL   Baseline Patient is using RUE as a gross assist more in the last month, still has difficulty with bilateral tasks.   Time 12   Period Weeks   Status On-going   OT LONG TERM GOAL #2   Title Patient will be able to utilize RUE as an assist with washing the hood of his car    Baseline Patient using RUE hand over hand for task but lacks wrist extension which limits task.   Time 12   Period Weeks   Status On-going   OT LONG TERM GOAL #3   Title Patient will demo right shoulder ABD sufficient to apply deodorant independently   Baseline Patient able to perform with minimal ABD but is working towards greater ROM to perform without  difficulty.   Time 12   Period Weeks   Status Partially Met   OT LONG TERM GOAL #4   Title will improve supination of right hand by 20 degrees to help stabilize hand on rake for yardwork   Baseline Patient is now having to place hand on rake in a pronated position.   Time 12   Period Weeks   Status On-going   OT LONG TERM GOAL #5   Title Patient will improve right grip sufficient to stabilize garden tool in his hand to dig dirt for flowers    Baseline Beginning to show signs of weak grasp on right but unable to hold with enough strength to dig dirt.   Time 12   Period Weeks   Status On-going   Long Term Additional Goals   Additional Long Term Goals Yes   OT LONG TERM GOAL #6   Title Patient will complete folding of laundry using right hand as an assist during task    Baseline Patient continues to require assistance with task but is able to stabilize select pieces of laundry.   Time 12   Period Weeks   Status On-going               Plan - 11/26/14 1610    Clinical Impression Statement Patient continues to be able to show consistent gains with AROM of RUE especially at the shoulder and elbow, still limited by increased tone/spasticity at the forearm, wrist and digits and will likely benefit from continued Botox injections along with therapy as an adjunct to medical treatment.  He is motivated and performs exercises at home on a daily basis which has improved his outcomes over time.     Pt will benefit from skilled therapeutic intervention in order to improve on the following deficits (Retired) Decreased coordination;Decreased range of motion;Impaired flexibility;Impaired tone;Impaired UE functional use;Pain;Decreased strength   Rehab Potential Good   Clinical Impairments Affecting Rehab Potential spasticity of the right side especially at the elbow for flexion   OT Frequency 2x / week   OT Duration 12 weeks   OT Treatment/Interventions Self-care/ADL training;Therapeutic  exercise;Patient/family education;Neuromuscular education;Manual Therapy;Therapeutic exercises;Electrical Stimulation;Passive range of motion   OT Home Exercise Plan Patient has established HEP from previous sessions and will continue to add to plan   Consulted and Agree with Plan of Care Patient        Problem List Patient Active Problem List   Diagnosis Date Noted  . Shoulder subluxation, right 04/23/2014  . Spastic hemiplegia affecting dominant side 02/18/2014  . HTN (hypertension) 01/17/2014  . Dyslipidemia 01/17/2014  . CVA (cerebral infarction) 12/27/2013  Jeremiyah Cullens T Clemma Johnsen, OTR/L, CLT  Anton Cheramie  11/26/2014, 4:13 PM  [place] MAIN [facility] SERVICES [address] [place], [place], [address] Phone: [phone]   Fax:  [phone]

## 2014-11-28 ENCOUNTER — Encounter: Payer: Self-pay | Admitting: Occupational Therapy

## 2014-12-03 ENCOUNTER — Ambulatory Visit: Payer: BLUE CROSS/BLUE SHIELD | Admitting: Occupational Therapy

## 2014-12-03 ENCOUNTER — Encounter: Payer: Self-pay | Admitting: Occupational Therapy

## 2014-12-03 DIAGNOSIS — I69351 Hemiplegia and hemiparesis following cerebral infarction affecting right dominant side: Secondary | ICD-10-CM | POA: Diagnosis not present

## 2014-12-03 DIAGNOSIS — IMO0002 Reserved for concepts with insufficient information to code with codable children: Secondary | ICD-10-CM

## 2014-12-03 DIAGNOSIS — M6281 Muscle weakness (generalized): Secondary | ICD-10-CM

## 2014-12-03 DIAGNOSIS — G8191 Hemiplegia, unspecified affecting right dominant side: Secondary | ICD-10-CM

## 2014-12-04 NOTE — Therapy (Signed)
[place] MAIN [facility] SERVICES [address] [place], [place], [address] Phone: [phone]   Fax:  [phone]  Occupational Therapy Treatment  Patient Details  Name: [name] MRN: [id number] Date of Birth: 05-Feb-1956 Referring Provider:  Meredith Staggers, MD  Encounter Date: 12/03/2014      OT End of Session - 12/03/14 2149    Visit Number 80   Number of Visits 85   Date for OT Re-Evaluation 12/14/14   OT Start Time 1300   OT Stop Time 1349   OT Time Calculation (min) 49 min   Activity Tolerance Patient tolerated treatment well;No increased pain   Behavior During Therapy [facility] for tasks assessed/performed      Past Medical History  Diagnosis Date  . Stroke     Past Surgical History  Procedure Laterality Date  . Knee arthroscopy Right     There were no vitals filed for this visit.  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   Currently in Pain? No/denies   Pain Score 0-No pain                      OT Treatments/Exercises (OP) - 12/03/14 2148    Neurological Re-education Exercises   Other Exercises 1 --   Other Exercises 2 Patient seen this date for facilitation of movement in supine and sitting for RUE as follows: AAROM for shoulder flexion to 130 degrees with cues to control arm on the decent and to focus on elbow extension while performing exercises. Patient completing place and hold exercises with shoulder at 90 degrees of flexion for up to a minute for 2 trials and then for 5  trials of 30 sec hold. Patient able to complete elbow extension with mild resistance from therapist and cues. PROM for supination of the forearm followed by AAROM with facilitation. Dowel exercises for shoulder flexion, chest press, elbow flexion/extension, forwards circles and ABD/ADD for 10-12 reps for 1 set each.   Patient seen for low to mid level reaching tasks with therapist guiding and cues for elbow extension with reach.  Gross grasp and release with right hand with facilitation and tapping for finger extension for release of objects.  Push/pull activity with cues.                 OT Education - 12/03/14 2148    Education provided Yes   Person(s) Educated Patient   Methods Explanation;Demonstration;Tactile cues;Verbal cues   Comprehension Returned demonstration;Verbal cues required;Verbalized understanding;Tactile cues required             OT Long Term Goals - 09/25/14 1659    OT LONG TERM GOAL #1   Title Patient will improve R UE function to be able to complete bilateral ADL   Baseline Patient is using RUE as a gross assist more in the last month, still has difficulty with bilateral tasks.   Time 12   Period Weeks   Status On-going   OT LONG TERM GOAL #2  Title Patient will be able to utilize RUE as an assist with washing the hood of his car    Baseline Patient using RUE hand over hand for task but lacks wrist extension which limits task.   Time 12   Period Weeks   Status On-going   OT LONG TERM GOAL #3   Title Patient will demo right shoulder ABD sufficient to apply deodorant independently   Baseline Patient able to perform with minimal ABD but is working towards greater ROM to perform without difficulty.   Time 12   Period Weeks   Status Partially Met   OT LONG TERM GOAL #4   Title will improve supination of right hand by 20 degrees to help stabilize hand on rake for yardwork   Baseline Patient is now having to place hand on rake in a pronated position.    Time 12   Period Weeks   Status On-going   OT LONG TERM GOAL #5   Title Patient will improve right grip sufficient to stabilize garden tool in his hand to dig dirt for flowers    Baseline Beginning to show signs of weak grasp on right but unable to hold with enough strength to dig dirt.   Time 12   Period Weeks   Status On-going   Long Term Additional Goals   Additional Long Term Goals Yes   OT LONG TERM GOAL #6   Title Patient will complete folding of laundry using right hand as an assist during task    Baseline Patient continues to require assistance with task but is able to stabilize select pieces of laundry.   Time 12   Period Weeks   Status On-going               Plan - 12/03/14 2149    Clinical Impression Statement Patient with improved control and range of motion with right UE for elbow extension with reaching tasks. When fatigued, he does require increased tapping and muscle facilitation for elbow extension.  MPs appeared tight this date but responded well  to PROM and stretching prior to attempts at active movement.    Pt will benefit from skilled therapeutic intervention in order to improve on the following deficits (Retired) Decreased coordination;Decreased range of motion;Impaired flexibility;Impaired tone;Impaired UE functional use;Pain;Decreased strength   Rehab Potential Good   Clinical Impairments Affecting Rehab Potential spasticity of the right side especially at the elbow for flexion   OT Frequency 2x / week   OT Duration 12 weeks   OT Treatment/Interventions Self-care/ADL training;Therapeutic exercise;Patient/family education;Neuromuscular education;Manual Therapy;Therapeutic exercises;Electrical Stimulation;Passive range of motion   OT Home Exercise Plan Patient has established HEP from previous sessions and will continue to add to plan   Consulted and Agree with Plan of Care Patient        Problem List Patient Active Problem List   Diagnosis Date  Noted  . Shoulder subluxation, right 04/23/2014  . Spastic hemiplegia affecting dominant side 02/18/2014  . HTN (hypertension) 01/17/2014  . Dyslipidemia 01/17/2014  . CVA (cerebral infarction) 12/27/2013   Rydan Gulyas T Tomasita Morrow, OTR/L, CLT Anaelle Dunton 12/04/2014, 9:43 PM  [place] MAIN [facility] SERVICES [address] [place], [place], [address] Phone: [phone]   Fax:  [phone]

## 2014-12-10 ENCOUNTER — Encounter: Payer: Self-pay | Admitting: Occupational Therapy

## 2014-12-10 ENCOUNTER — Ambulatory Visit: Payer: BLUE CROSS/BLUE SHIELD | Admitting: Occupational Therapy

## 2014-12-10 DIAGNOSIS — G8191 Hemiplegia, unspecified affecting right dominant side: Secondary | ICD-10-CM

## 2014-12-10 DIAGNOSIS — IMO0002 Reserved for concepts with insufficient information to code with codable children: Secondary | ICD-10-CM

## 2014-12-10 DIAGNOSIS — I69351 Hemiplegia and hemiparesis following cerebral infarction affecting right dominant side: Secondary | ICD-10-CM | POA: Diagnosis not present

## 2014-12-10 DIAGNOSIS — M6281 Muscle weakness (generalized): Secondary | ICD-10-CM

## 2014-12-11 NOTE — Therapy (Signed)
[place] MAIN [facility] SERVICES [address] [place], [place], [address] Phone: [phone]   Fax:  [phone]  Occupational Therapy Treatment  Patient Details  Name: [name] MRN: [id number] Date of Birth: 01/22/1956 Referring Provider:  Meredith Staggers, MD  Encounter Date: 12/10/2014      OT End of Session - 12/10/14 1714    Visit Number 80   Number of Visits 85   Date for OT Re-Evaluation 12/14/14   OT Start Time 1401   OT Stop Time 1451   OT Time Calculation (min) 50 min   Activity Tolerance Patient tolerated treatment well;No increased pain   Behavior During Therapy [facility] for tasks assessed/performed      Past Medical History  Diagnosis Date  . Stroke     Past Surgical History  Procedure Laterality Date  . Knee arthroscopy Right     There were no vitals filed for this visit.  Visit Diagnosis:  Hemiplegia affecting right dominant side  Lack of coordination due to stroke  Muscle weakness      Subjective Assessment - 12/10/14 1713    Subjective  Patient reports he is getting better at reaching for items on the table and trying to move or pick up with his right hand.     Pertinent History Botox injections in right UE on Monday 09-23-14, plans to have another round at the end of July   Patient Stated Goals Patient wants to be independent with all self care and IADL tasks.   Currently in Pain? No/denies   Pain Score 0-No pain                      OT Treatments/Exercises (OP) - 12/10/14 1714    Neurological Re-education Exercises   Other Exercises 1 Patient seen this date for facilitation of movement in supine and sitting for RUE as follows: AAROM for shoulder flexion to 130 degrees with cues to control arm on the decent and to focus on elbow extension while performing exercises. Patient completing place and hold exercises with shoulder at 90 degrees of flexion for up to a minute for 2 trials and then for  5 trials of 30 sec hold. Patient able to complete elbow extension with mild resistance from therapist and cues. PROM for supination of the forearm followed by AAROM with facilitation. Dowel exercises for shoulder flexion, chest press, elbow flexion/extension, forwards circles and ABD/ADD for 10-12 reps for 1 set each   Other Exercises 2 Patient seen for functional reaching tasks with items at table height in standing, sitting and reaching towards floor with right UE to grossly move items and/or pick up with gross grasp/pinch.                  OT Education - 12/10/14 1714    Education provided Yes   Person(s) Educated Patient   Methods Explanation;Demonstration;Verbal cues   Comprehension Verbal cues required;Returned demonstration;Verbalized understanding             OT Long Term Goals - 09/25/14 1659    OT LONG TERM GOAL #1   Title Patient will improve R UE function to be able to complete bilateral ADL   Baseline Patient is using RUE as a gross assist more in the last month, still has difficulty with bilateral tasks.   Time 12   Period Weeks   Status On-going   OT LONG TERM GOAL #2   Title Patient will be able  to utilize RUE as an assist with washing the hood of his car    Baseline Patient using RUE hand over hand for task but lacks wrist extension which limits task.   Time 12   Period Weeks   Status On-going   OT LONG TERM GOAL #3   Title Patient will demo right shoulder ABD sufficient to apply deodorant independently   Baseline Patient able to perform with minimal ABD but is working towards greater ROM to perform without difficulty.   Time 12   Period Weeks   Status Partially Met   OT LONG TERM GOAL #4   Title will improve supination of right hand by 20 degrees to help stabilize hand on rake for yardwork   Baseline Patient is now having to place hand on rake in a pronated position.   Time 12   Period Weeks   Status On-going   OT LONG TERM GOAL #5   Title Patient  will improve right grip sufficient to stabilize garden tool in his hand to dig dirt for flowers    Baseline Beginning to show signs of weak grasp on right but unable to hold with enough strength to dig dirt.   Time 12   Period Weeks   Status On-going   Long Term Additional Goals   Additional Long Term Goals Yes   OT LONG TERM GOAL #6   Title Patient will complete folding of laundry using right hand as an assist during task    Baseline Patient continues to require assistance with task but is able to stabilize select pieces of laundry.   Time 12   Period Weeks   Status On-going               Plan - 12/10/14 1715    Clinical Impression Statement Patient continues to make slow but steady progress with use of RUE, management of spasticity and incorporating arm into daily tasks.  He responds well to cues and still has been working at home on finger extension for release of objects.  He is planning to go back soon for another round of Botox injections and reports he is hopeful they will be more targeted to the forearm and hand.    Pt will benefit from skilled therapeutic intervention in order to improve on the following deficits (Retired) Decreased coordination;Decreased range of motion;Impaired flexibility;Impaired tone;Impaired UE functional use;Pain;Decreased strength   Rehab Potential Good   Clinical Impairments Affecting Rehab Potential spasticity of the right side especially at the elbow for flexion   OT Frequency 2x / week   OT Duration 12 weeks   OT Treatment/Interventions Self-care/ADL training;Therapeutic exercise;Patient/family education;Neuromuscular education;Manual Therapy;Therapeutic exercises;Electrical Stimulation;Passive range of motion   Consulted and Agree with Plan of Care Patient        Problem List Patient Active Problem List   Diagnosis Date Noted  . Shoulder subluxation, right 04/23/2014  . Spastic hemiplegia affecting dominant side 02/18/2014  . HTN  (hypertension) 01/17/2014  . Dyslipidemia 01/17/2014  . CVA (cerebral infarction) 12/27/2013   Koren Plyler T Tomasita Morrow, OTR/L, CLT Abeer Iversen 12/11/2014, 4:20 PM  [place] MAIN [facility] SERVICES [address] [place], [place], [address] Phone: [phone]   Fax:  [phone]

## 2014-12-12 ENCOUNTER — Ambulatory Visit: Payer: BLUE CROSS/BLUE SHIELD | Admitting: Occupational Therapy

## 2014-12-12 ENCOUNTER — Encounter: Payer: Self-pay | Admitting: Occupational Therapy

## 2014-12-12 DIAGNOSIS — IMO0002 Reserved for concepts with insufficient information to code with codable children: Secondary | ICD-10-CM

## 2014-12-12 DIAGNOSIS — M6281 Muscle weakness (generalized): Secondary | ICD-10-CM

## 2014-12-12 DIAGNOSIS — I69351 Hemiplegia and hemiparesis following cerebral infarction affecting right dominant side: Secondary | ICD-10-CM | POA: Diagnosis not present

## 2014-12-12 DIAGNOSIS — G8191 Hemiplegia, unspecified affecting right dominant side: Secondary | ICD-10-CM

## 2014-12-13 NOTE — Therapy (Signed)
[place] MAIN [facility] SERVICES [address] [place], [place], [address] Phone: [phone]   Fax:  [phone]  Occupational Therapy Treatment  Patient Details  Name: [name] MRN: [id number] Date of Birth: [birth year] Referring Provider:  Meredith Staggers, MD  Encounter Date: 12/12/2014      OT End of Session - 12/12/14 2209    Visit Number 59   Number of Visits 20   Date for OT Re-Evaluation 02/07/15   OT Start Time 1403   OT Stop Time 1454   OT Time Calculation (min) 51 min   Activity Tolerance Patient tolerated treatment well;No increased pain   Behavior During Therapy [facility] for tasks assessed/performed      Past Medical History  Diagnosis Date  . Stroke     Past Surgical History  Procedure Laterality Date  . Knee arthroscopy Right     There were no vitals filed for this visit.  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  He has an appt next week with his physician for Botox injections in the right arm and is hoping for focus on forearm and hand.     Patient Stated Goals Patient wants to be independent with all self care and IADL tasks.   Currently in Pain? No/denies   Pain Score 0-No pain                      OT Treatments/Exercises (OP) - 12/12/14 2209    ADLs   ADL Comments Patient seen for ADL reassessment as follows:  Patient demonstrates increased ability to reach for items in sitting and standing with the right hand, improved bathing  with right hand on left side of the body, able to apply deodorant without difficulty, he is able to hold a washcloth briefly but needs to improve consistency when reaching towards lower legs and feet.  He is able to place both hands on the large trashcan and roll to the street,grossly holding items in the sink to stabilize while left hand washes, able to vacuum more thoroughly, and has more stability and control of the arm and can hold in 90 degrees of flexion for up to a minute or more.     Neurological Re-education Exercises   Other Exercises 1 Patient seen for reassessment of RUE as follows:  Shoulder flexion passively in supine 153 degrees, actively 110 degrees, ABD passive 100 degrees, active 60 degrees, elbow flexion passive full, active 130 degrees, elbow extension passive full, active -50 degrees, supination to neutral and slightly beyond actively.  Wrist extension passively 45 degrees, active-20 degrees, still shows signs of intiation of finger extension especially at the right ring finger but still not fully consistent. He has 5# of grip on the right and 80# on the left.                 OT Education - 12/12/14 2209    Education provided Yes  Education Details HEP, reaching, finger extension.   Person(s) Educated Patient   Methods Explanation;Demonstration;Verbal cues   Comprehension Verbal cues required;Returned demonstration;Verbalized understanding             OT Long Term Goals - 12/13/14 1423    OT LONG TERM GOAL #1   Title Patient will improve R UE function to be able to complete bilateral ADL   Baseline Patient able to place both hands on large trash can and roll to the street now.   Time 8   Period Weeks   Status Partially Met   OT LONG TERM GOAL #2   Title Patient will be able to utilize RUE as an assist with washing the hood of his car    Time 8   Period Weeks   Status Achieved   OT LONG TERM GOAL #3   Title Patient will demo right shoulder ABD sufficient  to apply deodorant independently   Time 8   Period Weeks   Status Achieved   OT LONG TERM GOAL #4   Title will improve supination of right hand by 20 degrees to help stabilize hand on rake for yardwork   Baseline Patient is now having to place hand on rake in a pronated position.   Time 8   Period Weeks   Status Partially Met   OT LONG TERM GOAL #5   Title Patient will improve right grip sufficient to stabilize garden tool in his hand to dig dirt for flowers    Baseline able to hold but not consistent yet with use   Time 8   Period Weeks   Status Partially Met   OT LONG TERM GOAL #6   Title Patient will complete folding of laundry using right hand as an assist during task    Baseline using right hand to help stabilize items but still lacks wrist extension of performing in standing.   Time 8   Period Weeks   Status Partially Met   OT LONG TERM GOAL #7   Title Patient will demonstrate the ability to hold a washcloth consistently and wash his lower legs and feet with modified independence.    Time 8   Period Weeks   Status New   OT LONG TERM GOAL #8   Title Patient will demonstrate secure grip on golf club to be able to putt around in his yard.   Time 8   Period Weeks   Status New               Plan - 12/12/14 2211    Clinical Impression Statement Patient has continued to make progress with his right UE over the last couple of months, Botox wearing off but he has been more consistent with controlled movement of the right shoulder.  Still has increased spasticity in the right elbow, forearm and hand.  He has improved with passive and active ROM and has been incorporating right hand into more tasks on a daily basis as noted above.  He is planning to receive another round of Botox next week and would likely benefit from additional OT to maximize the effects and potential of use with right arm for increased independence in self care and IADL tasks.    Pt will benefit from skilled  therapeutic intervention in order to improve on the following deficits (Retired) Decreased coordination;Decreased range of motion;Impaired flexibility;Impaired tone;Impaired UE functional use;Pain;Decreased strength   Rehab Potential Good   Clinical Impairments Affecting Rehab Potential spasticity of the  right side especially at the elbow for flexion   OT Frequency 2x / week   OT Duration 8 weeks   OT Treatment/Interventions Self-care/ADL training;Therapeutic exercise;Patient/family education;Neuromuscular education;Manual Therapy;Therapeutic exercises;Electrical Stimulation;Passive range of motion   OT Home Exercise Plan Patient has established HEP from previous sessions and will continue to add to plan   Consulted and Agree with Plan of Care Patient        Problem List Patient Active Problem List   Diagnosis Date Noted  . Shoulder subluxation, right 04/23/2014  . Spastic hemiplegia affecting dominant side 02/18/2014  . HTN (hypertension) 01/17/2014  . Dyslipidemia 01/17/2014  . CVA (cerebral infarction) 12/27/2013   Amy T Tomasita Morrow, OTR/L, CLT Lovett,Amy 12/13/2014, 2:29 PM  [place] MAIN [facility] SERVICES [address] [place], [place], [address] Phone: [phone]   Fax:  [phone]

## 2014-12-17 ENCOUNTER — Ambulatory Visit (HOSPITAL_BASED_OUTPATIENT_CLINIC_OR_DEPARTMENT_OTHER): Payer: BLUE CROSS/BLUE SHIELD | Admitting: Physical Medicine & Rehabilitation

## 2014-12-17 ENCOUNTER — Encounter: Payer: BLUE CROSS/BLUE SHIELD | Attending: Physical Medicine & Rehabilitation

## 2014-12-17 ENCOUNTER — Ambulatory Visit: Payer: BLUE CROSS/BLUE SHIELD | Admitting: Occupational Therapy

## 2014-12-17 ENCOUNTER — Encounter: Payer: Self-pay | Admitting: Physical Medicine & Rehabilitation

## 2014-12-17 VITALS — BP 153/91 | HR 94 | Resp 14

## 2014-12-17 DIAGNOSIS — M25511 Pain in right shoulder: Secondary | ICD-10-CM | POA: Diagnosis not present

## 2014-12-17 DIAGNOSIS — G8110 Spastic hemiplegia affecting unspecified side: Secondary | ICD-10-CM

## 2014-12-17 DIAGNOSIS — Z7982 Long term (current) use of aspirin: Secondary | ICD-10-CM | POA: Diagnosis not present

## 2014-12-17 DIAGNOSIS — I69351 Hemiplegia and hemiparesis following cerebral infarction affecting right dominant side: Secondary | ICD-10-CM | POA: Diagnosis not present

## 2014-12-17 DIAGNOSIS — IMO0002 Reserved for concepts with insufficient information to code with codable children: Secondary | ICD-10-CM

## 2014-12-17 DIAGNOSIS — M6281 Muscle weakness (generalized): Secondary | ICD-10-CM

## 2014-12-17 DIAGNOSIS — G8191 Hemiplegia, unspecified affecting right dominant side: Secondary | ICD-10-CM

## 2014-12-17 NOTE — Patient Instructions (Signed)

## 2014-12-17 NOTE — Progress Notes (Signed)
Botox Injection for spasticity using needle EMG guidance  Dilution: 50 Units/ml Indication: Severe spasticity which interferes with ADL,mobility and/or  hygiene and is unresponsive to medication management and other conservative care Informed consent was obtained after describing risks and benefits of the procedure with the patient. This includes bleeding, bruising, infection, excessive weakness, or medication side effects. A REMS form is on file and signed. Needle: 27 g 1" needle electrode Number of units per muscle FCR 50 FCU 50 FDS 50 FDP 50 PT 50 Brachioradialis 50 Biceps 100 units  All injections were done after obtaining appropriate EMG activity and after negative drawback for blood. The patient tolerated the procedure well. Post procedure instructions were given. A followup appointment was made.

## 2014-12-18 NOTE — Therapy (Signed)
[facility] MAIN [facility] SERVICES [address] [place], [place], [address] Phone: [phone]   Fax:  [phone]  Occupational Therapy Treatment  Patient Details  Name: [name] MRN: [id number] Date of Birth: [birth year] Referring Provider:  Ranelle Oyster, MD  Encounter Date: 12/17/2014      OT End of Session - 12/17/14 1643    Visit Number 83   Number of Visits 98   Date for OT Re-Evaluation 02/07/15   OT Start Time 1000   OT Stop Time 1049   OT Time Calculation (min) 49 min   Equipment Utilized During Treatment UE ranger   Activity Tolerance Patient tolerated treatment well;No increased pain   Behavior During Therapy [facility] for tasks assessed/performed      Past Medical History  Diagnosis Date  . Stroke     Past Surgical History  Procedure Laterality Date  . Knee arthroscopy Right     There were no vitals filed for this visit.  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    Limitations Botox injections in right UE on 12-17-2014   Patient Stated Goals Patient wants to be independent with all self care and IADL tasks.   Currently in Pain? No/denies   Pain Score 0-No pain                      OT Treatments/Exercises (OP) - 12/17/14 1644    ADLs   ADL Comments Simulated deodorant donning, grasp on tools such as broom, rake.  Shoe tying with right hand as a stablizer on laces.    Neurological Re-education Exercises   Other Exercises 1 Patient seen this date for facilitation of movement in supine and sitting for RUE as follows: AAROM for shoulder flexion to 130 degrees with cues to control arm on the decent and  to focus on elbow extension while performing exercises. Patient completing place and hold exercises with shoulder at 90 degrees of flexion for up to a minute for 2 trials and then for 5 trials of 30 sec hold. Patient able to complete elbow extension with mild resistance from therapist and cues. PROM for supination of the forearm followed by AAROM with facilitation. Patient also seen for UE ranger exercise with assist to place hand onto ranger in sitting and therapist guiding in multi directions.     Other Exercises 2 Prolonged stretching followed by facilitation for ER, supination of forearm and wrist extension.                  OT Education - 12/17/14 1502    Education provided Yes   Education Details HEP, positioning for exercises, ER stretch   Person(s) Educated Patient   Methods Explanation;Demonstration;Verbal cues   Comprehension Verbal cues required;Returned demonstration;Verbalized understanding             OT Long Term Goals - 12/13/14 1423    OT LONG TERM GOAL #1   Title Patient will improve R UE function to be able to complete bilateral ADL   Baseline Patient able to place both hands on large trash can and roll to the street now.  Time 8   Period Weeks   Status Partially Met   OT LONG TERM GOAL #2   Title Patient will be able to utilize RUE as an assist with washing the hood of his car    Time 8   Period Weeks   Status Achieved   OT LONG TERM GOAL #3   Title Patient will demo right shoulder ABD sufficient to apply deodorant independently   Time 8   Period Weeks   Status Achieved   OT LONG TERM GOAL #4   Title will improve supination of right hand by 20 degrees to help stabilize hand on rake for yardwork   Baseline Patient is now having to place hand on rake in a pronated position.   Time 8   Period Weeks   Status Partially Met   OT LONG TERM GOAL #5   Title Patient will improve right grip sufficient to stabilize garden tool in his hand to dig dirt for  flowers    Baseline able to hold but not consistent yet with use   Time 8   Period Weeks   Status Partially Met   OT LONG TERM GOAL #6   Title Patient will complete folding of laundry using right hand as an assist during task    Baseline using right hand to help stabilize items but still lacks wrist extension of performing in standing.   Time 8   Period Weeks   Status Partially Met   OT LONG TERM GOAL #7   Title Patient will demonstrate the ability to hold a washcloth consistently and wash his lower legs and feet with modified independence.    Time 8   Period Weeks   Status New   OT LONG TERM GOAL #8   Title Patient will demonstrate secure grip on golf club to be able to putt around in his yard.   Time 8   Period Weeks   Status New               Plan - 12/17/14 1644    Clinical Impression Statement Patient continues to progress but still has difficulty with grasping and functional use of the right hand and forearm.  He is able to use as a gross assist and is working towards improved grasp techniques and isolated finger movements.  Will be getting Botox this week and would benefit from continued therapy in conjunction with medical intervention for spasticity to improve right UE use in daily tasks.    Pt will benefit from skilled therapeutic intervention in order to improve on the following deficits (Retired) Decreased coordination;Decreased range of motion;Impaired flexibility;Impaired tone;Impaired UE functional use;Pain;Decreased strength   Rehab Potential Good   Clinical Impairments Affecting Rehab Potential spasticity of the right side especially at the elbow for flexion   OT Frequency 2x / week   OT Duration 8 weeks   OT Treatment/Interventions Self-care/ADL training;Therapeutic exercise;Patient/family education;Neuromuscular education;Manual Therapy;Therapeutic exercises;Electrical Stimulation;Passive range of motion   Consulted and Agree with Plan of Care Patient         Problem List Patient Active Problem List   Diagnosis Date Noted  . Shoulder subluxation, right 04/23/2014  . Spastic hemiplegia affecting dominant side 02/18/2014  . HTN (hypertension) 01/17/2014  . Dyslipidemia 01/17/2014  . CVA (cerebral infarction) 12/27/2013   Amy T Tomasita Morrow, OTR/L, CLT Lovett,Amy 12/18/2014, 8:24 PM  [place] MAIN [facility] SERVICES [address] [place], [place], [address] Phone: [phone]   Fax:  [phone]

## 2014-12-19 ENCOUNTER — Encounter: Payer: Self-pay | Admitting: Occupational Therapy

## 2014-12-19 ENCOUNTER — Ambulatory Visit: Payer: BLUE CROSS/BLUE SHIELD | Admitting: Occupational Therapy

## 2014-12-19 DIAGNOSIS — G8191 Hemiplegia, unspecified affecting right dominant side: Secondary | ICD-10-CM

## 2014-12-19 DIAGNOSIS — M6281 Muscle weakness (generalized): Secondary | ICD-10-CM

## 2014-12-19 DIAGNOSIS — IMO0002 Reserved for concepts with insufficient information to code with codable children: Secondary | ICD-10-CM

## 2014-12-19 DIAGNOSIS — I69351 Hemiplegia and hemiparesis following cerebral infarction affecting right dominant side: Secondary | ICD-10-CM | POA: Diagnosis not present

## 2014-12-19 NOTE — Therapy (Signed)
[place] MAIN [facility] SERVICES [address] [place], [place], [address] Phone: [phone]   Fax:  [phone]  Occupational Therapy Treatment  Patient Details  Name: [name] MRN: [id number] Date of Birth: 07-28-1955 Referring Provider:  Meredith Staggers, MD  Encounter Date: 12/19/2014      OT End of Session - 12/19/14 1528    Visit Number 5   Number of Visits 98   Date for OT Re-Evaluation 02/07/15   OT Start Time 1100   OT Stop Time 1146   OT Time Calculation (min) 46 min   Equipment Utilized During Treatment Dowel   Activity Tolerance Patient tolerated treatment well;No increased pain   Behavior During Therapy [facility] for tasks assessed/performed      Past Medical History  Diagnosis Date  . Stroke     Past Surgical History  Procedure Laterality Date  . Knee arthroscopy Right     There were no vitals filed for this visit.  Visit Diagnosis:  Hemiplegia affecting right dominant side  Lack of coordination due to stroke  Muscle weakness      Subjective Assessment - 12/19/14 1521    Subjective  Patient reports he got his Botox injections the other day, is a little sore and knows it may take about a week or more to see some changes.    Limitations Botox injections in right UE on 12-17-2014   Patient Stated Goals Patient wants to be independent with all self care and IADL tasks.   Currently in Pain? No/denies   Pain Score 0-No pain                      OT Treatments/Exercises (OP) - 12/19/14 1541    Neurological Re-education Exercises   Other Exercises 1 Tone inhibition techniques with rolling, trunk dissociation, prior to ROM to decrease effects of spasticity.  Patient seen for [facility] for R shoulder flexion, ABD, ER, place and hold, ADD to reach across to opposite side and diagonal patterns to promote reaching patterns.  Patient able to hold arm for 30 seconds or greater, has increased elbow flexion in this  position and requires therapist to assist with extension of the elbow.  Sitting patient engaging in dowel exercise for pushing forwards and back with therapist guiding at right elbow and tapping to promote extension of the arm.  Weight bearing in sitting with manual wrist stretches from therapist to promote wrist extension.      Manual Therapy   Manual therapy comments Patient seen for mobilization of right scapula for elevation, depression, ABD/ADD as well as mobs to right shoulder at the joint capsule for posterior and inferior grade II glides to improve ROM at the shoulder.    Scapular Mobilization                  OT Education - 12/19/14 1525    Education provided Yes   Education Details continued focus on HEP, stretches and ROM for RUE.   Person(s) Educated Patient   Methods Explanation;Demonstration;Verbal cues   Comprehension Verbal cues required;Returned demonstration;Verbalized understanding             OT Long Term Goals - 12/13/14 1423    OT LONG TERM GOAL #1   Title Patient will improve R UE function to be able to complete bilateral ADL   Baseline Patient able to place both hands on large trash can and roll to the street now.   Time 8  Period Weeks   Status Partially Met   OT LONG TERM GOAL #2   Title Patient will be able to utilize RUE as an assist with washing the hood of his car    Time 8   Period Weeks   Status Achieved   OT LONG TERM GOAL #3   Title Patient will demo right shoulder ABD sufficient to apply deodorant independently   Time 8   Period Weeks   Status Achieved   OT LONG TERM GOAL #4   Title will improve supination of right hand by 20 degrees to help stabilize hand on rake for yardwork   Baseline Patient is now having to place hand on rake in a pronated position.   Time 8   Period Weeks   Status Partially Met   OT LONG TERM GOAL #5   Title Patient will improve right grip sufficient to stabilize garden tool in his hand to dig dirt for  flowers    Baseline able to hold but not consistent yet with use   Time 8   Period Weeks   Status Partially Met   OT LONG TERM GOAL #6   Title Patient will complete folding of laundry using right hand as an assist during task    Baseline using right hand to help stabilize items but still lacks wrist extension of performing in standing.   Time 8   Period Weeks   Status Partially Met   OT LONG TERM GOAL #7   Title Patient will demonstrate the ability to hold a washcloth consistently and wash his lower legs and feet with modified independence.    Time 8   Period Weeks   Status New   OT LONG TERM GOAL #8   Title Patient will demonstrate secure grip on golf club to be able to putt around in his yard.   Time 8   Period Weeks   Status New               Plan - 12/19/14 1530    Clinical Impression Statement Patient just got another round of Botox injections this week and would benefit from continued therapy in conjunction with the shorts to work towards neuromuscular reeducation fo the right UE to use in daily tasks.  Patient has continued to make progress with ROM and  using the right UE as a gross assist since last rounds of botox injections.    Pt will benefit from skilled therapeutic intervention in order to improve on the following deficits (Retired) Decreased coordination;Decreased range of motion;Impaired flexibility;Impaired tone;Impaired UE functional use;Pain;Decreased strength   Rehab Potential Good   Clinical Impairments Affecting Rehab Potential spasticity of the right side especially at the elbow for flexion   OT Frequency 2x / week   OT Duration 8 weeks   OT Treatment/Interventions Self-care/ADL training;Therapeutic exercise;Patient/family education;Neuromuscular education;Manual Therapy;Therapeutic exercises;Electrical Stimulation;Passive range of motion   OT Home Exercise Plan Patient has established HEP from previous sessions and will continue to add to plan    Consulted and Agree with Plan of Care Patient        Problem List Patient Active Problem List   Diagnosis Date Noted  . Shoulder subluxation, right 04/23/2014  . Spastic hemiplegia affecting dominant side 02/18/2014  . HTN (hypertension) 01/17/2014  . Dyslipidemia 01/17/2014  . CVA (cerebral infarction) 12/27/2013   Amy T Lovett, OTR/L, CLT Lovett,Amy 12/19/2014, 3:50 PM  [place] MAIN [facility] SERVICES [place], [place],  [place] Phone: [phone]   Fax:  [phone]

## 2014-12-24 ENCOUNTER — Ambulatory Visit: Payer: BLUE CROSS/BLUE SHIELD | Attending: Physical Medicine & Rehabilitation | Admitting: Occupational Therapy

## 2014-12-24 DIAGNOSIS — G8191 Hemiplegia, unspecified affecting right dominant side: Secondary | ICD-10-CM

## 2014-12-24 DIAGNOSIS — M6281 Muscle weakness (generalized): Secondary | ICD-10-CM | POA: Insufficient documentation

## 2014-12-24 DIAGNOSIS — I6980 Unspecified sequelae of other cerebrovascular disease: Secondary | ICD-10-CM | POA: Insufficient documentation

## 2014-12-24 DIAGNOSIS — IMO0002 Reserved for concepts with insufficient information to code with codable children: Secondary | ICD-10-CM

## 2014-12-24 DIAGNOSIS — R279 Unspecified lack of coordination: Secondary | ICD-10-CM | POA: Insufficient documentation

## 2014-12-25 ENCOUNTER — Encounter: Payer: Self-pay | Admitting: Occupational Therapy

## 2014-12-25 NOTE — Therapy (Signed)
[place] MAIN [facility] SERVICES [address] [place], [place], [address] Phone: [phone]   Fax:  [phone]  Occupational Therapy Treatment  Patient Details  Name: [name] MRN: [id number] Date of Birth: 10/08/1955 Referring Provider:  Meredith Staggers, MD  Encounter Date: 12/24/2014      OT End of Session - 12/25/14 1815    Visit Number 54   Number of Visits 98   Date for OT Re-Evaluation 02/07/15   OT Start Time 1400   OT Stop Time 1447   OT Time Calculation (min) 47 min   Activity Tolerance Patient tolerated treatment well;No increased pain   Behavior During Therapy [facility] for tasks assessed/performed      Past Medical History  Diagnosis Date  . Stroke     Past Surgical History  Procedure Laterality Date  . Knee arthroscopy Right     There were no vitals filed for this visit.  Visit Diagnosis:  Hemiplegia affecting right dominant side  Lack of coordination due to stroke  Muscle weakness      Subjective Assessment - 12/24/14 1813    Subjective  Patient reports the soreness from the Botox injections has gone away for the most part but he hasn't been able to tell a difference yet with his arm, does feel like it may be more relaxed at his side.    Pertinent History Botox injections in right UE on 12-17-2014   Patient Stated Goals Patient wants to be independent with all self care and IADL tasks.   Currently in Pain? No/denies   Pain Score 0-No pain                      OT Treatments/Exercises (OP) - 12/24/14 1818    Neurological Re-education Exercises   Other Exercises 1 Patient seen for AAROM for R shoulder flexion, ABD, ER, place and hold, ADD to reach across to opposite side and diagonal patterns to promote reaching patterns. Patient able to hold arm for 30 seconds or greater, has increased elbow flexion in this position and requires therapist to assist with extension of the elbow. Sitting patient  engaging in dowel exercise for pushing forwards and back with therapist guiding at right elbow and tapping to promote extension of the arm.   Other Exercises 2 Focused on right elbow extension this date with place and hold, reaching in multiple directions in supine and sitting. with cues and guiding techniques from therapist.    Other Weight-Bearing Exercises 1 weight bearing to right UE with therapist assist for placement of the arm and hand as well as facilitation of weight shifting to produce weight bearing.   Trunk Rotation trunk dissociation and rotation movements with minimal assist.   Manual Therapy   Manual therapy comments Patient seen for mobilization of right scapula for elevation, depression, ABD/ADD as well as mobs to right shoulder at the joint capsule for posterior and inferior grade II glides to improve ROM at the shoulder.    Scapular Mobilization                  OT Education - 12/24/14 1814    Education provided Yes   Education Details HEP, botox injections, ROM   Person(s) Educated Patient   Methods Explanation;Demonstration;Verbal cues   Comprehension Verbal cues required;Returned demonstration;Verbalized understanding             OT Long Term Goals - 12/13/14 1423    OT LONG TERM GOAL #  1   Title Patient will improve R UE function to be able to complete bilateral ADL   Baseline Patient able to place both hands on large trash can and roll to the street now.   Time 8   Period Weeks   Status Partially Met   OT LONG TERM GOAL #2   Title Patient will be able to utilize RUE as an assist with washing the hood of his car    Time 8   Period Weeks   Status Achieved   OT LONG TERM GOAL #3   Title Patient will demo right shoulder ABD sufficient to apply deodorant independently   Time 8   Period Weeks   Status Achieved   OT LONG TERM GOAL #4   Title will improve supination of right hand by 20 degrees to help stabilize hand on rake for yardwork   Baseline  Patient is now having to place hand on rake in a pronated position.   Time 8   Period Weeks   Status Partially Met   OT LONG TERM GOAL #5   Title Patient will improve right grip sufficient to stabilize garden tool in his hand to dig dirt for flowers    Baseline able to hold but not consistent yet with use   Time 8   Period Weeks   Status Partially Met   OT LONG TERM GOAL #6   Title Patient will complete folding of laundry using right hand as an assist during task    Baseline using right hand to help stabilize items but still lacks wrist extension of performing in standing.   Time 8   Period Weeks   Status Partially Met   OT LONG TERM GOAL #7   Title Patient will demonstrate the ability to hold a washcloth consistently and wash his lower legs and feet with modified independence.    Time 8   Period Weeks   Status New   OT LONG TERM GOAL #8   Title Patient will demonstrate secure grip on golf club to be able to putt around in his yard.   Time 8   Period Weeks   Status New               Plan - 12/24/14 1815    Clinical Impression Statement Patient has not yet seen any significant results from Botox injections but MD reported it may take 7-10 days before he sees any changes.  Patient has still been working on ROM of the right arm and tone inhibition techniques.  Continues to use the right arm as much as possible in daily tasks.   Pt will benefit from skilled therapeutic intervention in order to improve on the following deficits (Retired) Decreased coordination;Decreased range of motion;Impaired flexibility;Impaired tone;Impaired UE functional use;Pain;Decreased strength   Rehab Potential Good   Clinical Impairments Affecting Rehab Potential spasticity of the right side especially at the elbow for flexion   OT Frequency 2x / week   OT Duration 8 weeks   OT Treatment/Interventions Self-care/ADL training;Therapeutic exercise;Patient/family education;Neuromuscular education;Manual  Therapy;Therapeutic exercises;Electrical Stimulation;Passive range of motion   OT Home Exercise Plan Patient has established HEP from previous sessions and will continue to add to plan   Consulted and Agree with Plan of Care Patient        Problem List Patient Active Problem List   Diagnosis Date Noted  . Shoulder subluxation, right 04/23/2014  . Spastic hemiplegia affecting dominant side 02/18/2014  . HTN (hypertension) 01/17/2014  .  Dyslipidemia 01/17/2014  . CVA (cerebral infarction) 12/27/2013   Amy T Tomasita Morrow, OTR/L, CLT  Lovett,Amy 12/25/2014, 6:20 PM  [employer] MAIN [facility] SERVICES [address] [place], [place], [address] Phone: [phone]   Fax:  [phone]

## 2014-12-26 ENCOUNTER — Ambulatory Visit: Payer: BLUE CROSS/BLUE SHIELD | Admitting: Occupational Therapy

## 2014-12-26 DIAGNOSIS — M6281 Muscle weakness (generalized): Secondary | ICD-10-CM

## 2014-12-26 DIAGNOSIS — G8191 Hemiplegia, unspecified affecting right dominant side: Secondary | ICD-10-CM

## 2014-12-26 DIAGNOSIS — IMO0002 Reserved for concepts with insufficient information to code with codable children: Secondary | ICD-10-CM

## 2014-12-27 ENCOUNTER — Encounter: Payer: Self-pay | Admitting: Occupational Therapy

## 2014-12-27 NOTE — Therapy (Signed)
[place] MAIN [facility] SERVICES [address] [place], [place], [address] Phone: [phone]   Fax:  [phone]  Occupational Therapy Treatment  Patient Details  Name: [name] MRN: [id number] Date of Birth: 01-08-1956 Referring Provider:  Meredith Staggers, MD  Encounter Date: 12/26/2014      OT End of Session - 12/26/14 2012    Visit Number 86   Number of Visits 98   Date for OT Re-Evaluation 02/07/15   OT Start Time 1400   OT Stop Time 1450   OT Time Calculation (min) 50 min   Equipment Utilized During Treatment shape tower, foam roll   Activity Tolerance Patient tolerated treatment well;No increased pain   Behavior During Therapy [facility] for tasks assessed/performed      Past Medical History  Diagnosis Date  . Stroke     Past Surgical History  Procedure Laterality Date  . Knee arthroscopy Right     There were no vitals filed for this visit.  Visit Diagnosis:  Hemiplegia affecting right dominant side  Lack of coordination due to stroke  Muscle weakness      Subjective Assessment - 12/26/14 2010    Subjective  Patient reports his arm feels more relaxed than it did before, starting to tell a difference with the Botox injections.     Pertinent History Botox injections in right UE on 12-17-2014   Patient Stated Goals Patient wants to be independent with all self care and IADL tasks.   Currently in Pain? No/denies   Pain Score 0-No pain   Multiple Pain Sites No                      OT Treatments/Exercises (OP) - 12/26/14 2015    Neurological Re-education Exercises   Other Exercises 1 Patient seen for AAROM for R shoulder flexion, ABD, ER, place and hold, ADD to reach across to opposite side and diagonal patterns to promote reaching patterns. Patient able to hold arm for 30 seconds or greater, has increased elbow flexion in this position and requires therapist to assist with extension of the elbow. Sitting  patient engaging in dowel exercise for pushing forwards and back with therapist guiding at right elbow and tapping to promote extension of the arm.   Other Weight-Bearing Exercises 1 weight bearing to right UE with therapist assist for placement of the arm and hand as well as facilitation of weight shifting to produce weight bearing.  Cues to lean forward over wrist, weight shifts to the right with reaching with left arm towards right side to promote increased weight bearing.     Manual Therapy   Scapular Mobilization                  OT Education - 12/26/14 2011    Education provided Yes   Education Details HEP   Person(s) Educated Patient   Methods Demonstration;Verbal cues;Explanation   Comprehension Verbalized understanding;Returned demonstration;Verbal cues required             OT Long Term Goals - 12/13/14 1423    OT LONG TERM GOAL #1   Title Patient will improve R UE function to be able to complete bilateral ADL   Baseline Patient able to place both hands on large trash can and roll to the street now.   Time 8   Period Weeks   Status Partially Met   OT LONG TERM GOAL #2   Title Patient will be able  to utilize RUE as an assist with washing the hood of his car    Time 8   Period Weeks   Status Achieved   OT LONG TERM GOAL #3   Title Patient will demo right shoulder ABD sufficient to apply deodorant independently   Time 8   Period Weeks   Status Achieved   OT LONG TERM GOAL #4   Title will improve supination of right hand by 20 degrees to help stabilize hand on rake for yardwork   Baseline Patient is now having to place hand on rake in a pronated position.   Time 8   Period Weeks   Status Partially Met   OT LONG TERM GOAL #5   Title Patient will improve right grip sufficient to stabilize garden tool in his hand to dig dirt for flowers    Baseline able to hold but not consistent yet with use   Time 8   Period Weeks   Status Partially Met   OT LONG TERM  GOAL #6   Title Patient will complete folding of laundry using right hand as an assist during task    Baseline using right hand to help stabilize items but still lacks wrist extension of performing in standing.   Time 8   Period Weeks   Status Partially Met   OT LONG TERM GOAL #7   Title Patient will demonstrate the ability to hold a washcloth consistently and wash his lower legs and feet with modified independence.    Time 8   Period Weeks   Status New   OT LONG TERM GOAL #8   Title Patient will demonstrate secure grip on golf club to be able to putt around in his yard.   Time 8   Period Weeks   Status New               Plan - 12/26/14 2013    Clinical Impression Statement Patient is starting to show some progress with ROM after Botox injections, improved elbow extension this date with reaching tasks.  Performed shape tower with reaching and therapist guiding.  Foam roll utilized with right hand on the end in a neutral position to perform ROM with cues in supine.  Continue to focus on RUE neuro reeducation after Botox injections.     Pt will benefit from skilled therapeutic intervention in order to improve on the following deficits (Retired) Decreased coordination;Decreased range of motion;Impaired flexibility;Impaired tone;Impaired UE functional use;Pain;Decreased strength   Rehab Potential Good   Clinical Impairments Affecting Rehab Potential spasticity of the right side especially at the elbow for flexion   OT Frequency 2x / week   OT Duration 8 weeks   OT Treatment/Interventions Self-care/ADL training;Therapeutic exercise;Patient/family education;Neuromuscular education;Manual Therapy;Therapeutic exercises;Electrical Stimulation;Passive range of motion   OT Home Exercise Plan Patient has established HEP from previous sessions and will continue to add to plan   Consulted and Agree with Plan of Care Patient        Problem List Patient Active Problem List   Diagnosis Date  Noted  . Shoulder subluxation, right 04/23/2014  . Spastic hemiplegia affecting dominant side 02/18/2014  . HTN (hypertension) 01/17/2014  . Dyslipidemia 01/17/2014  . CVA (cerebral infarction) 12/27/2013   Coraline Talwar T Tomasita Morrow, OTR/L, CLT Trent Gabler 12/27/2014, 8:27 PM  [place] MAIN [facility] SERVICES [address] [place], [place], [address] Phone: [phone]   Fax:  [phone]

## 2014-12-31 ENCOUNTER — Encounter: Payer: BLUE CROSS/BLUE SHIELD | Admitting: Occupational Therapy

## 2015-01-03 ENCOUNTER — Other Ambulatory Visit: Payer: Self-pay | Admitting: Family Medicine

## 2015-01-03 DIAGNOSIS — M25511 Pain in right shoulder: Principal | ICD-10-CM

## 2015-01-03 DIAGNOSIS — G8929 Other chronic pain: Secondary | ICD-10-CM

## 2015-01-07 ENCOUNTER — Ambulatory Visit: Payer: BLUE CROSS/BLUE SHIELD | Admitting: Occupational Therapy

## 2015-01-07 DIAGNOSIS — IMO0002 Reserved for concepts with insufficient information to code with codable children: Secondary | ICD-10-CM

## 2015-01-07 DIAGNOSIS — M6281 Muscle weakness (generalized): Secondary | ICD-10-CM

## 2015-01-07 DIAGNOSIS — G8191 Hemiplegia, unspecified affecting right dominant side: Secondary | ICD-10-CM

## 2015-01-08 NOTE — Therapy (Signed)
[place] MAIN [facility] SERVICES [address] [place], [place], [address] Phone: [phone]   Fax:  [phone]  Occupational Therapy Treatment  Patient Details  Name: [name] MRN: [id number] Date of Birth: 19-Dec-1955 Referring Provider:  Meredith Staggers, MD  Encounter Date: 01/07/2015      OT End of Session - 01/07/15 1918    Visit Number 87   Number of Visits 98   Date for OT Re-Evaluation 02/07/15   OT Start Time 1300   OT Stop Time 1349   OT Time Calculation (min) 49 min   Activity Tolerance Patient tolerated treatment well;No increased pain   Behavior During Therapy [facility] for tasks assessed/performed      Past Medical History  Diagnosis Date  . Stroke     Past Surgical History  Procedure Laterality Date  . Knee arthroscopy Right     There were no vitals filed for this visit.  Visit Diagnosis:  Hemiplegia affecting right dominant side  Lack of coordination due to stroke  Muscle weakness      Subjective Assessment - 01/07/15 1916    Subjective  Patient reports he has been working on exercises at home and feels his arm is somewhat better, he went to try to move some speakers and was able to wrap his right arm around teh speaker to help move it .     Pertinent History Botox injections in right UE on 12-17-2014   Patient Stated Goals Patient wants to be independent with all self care and IADL tasks.   Currently in Pain? No/denies   Pain Score 0-No pain   Multiple Pain Sites No                      OT Treatments/Exercises (OP) - 01/07/15 2049    Neurological Re-education Exercises   Other Exercises 1 Patient seen for AAROM for R shoulder flexion, ABD, ER, place and hold, ADD to reach across to opposite side and diagonal patterns to promote reaching patterns. Patient able to hold arm for 30 seconds or greater, has increased elbow flexion in this position and requires therapist to assist with extension of  the elbow. Sitting patient engaging in dowel exercise for pushing forwards and back with therapist guiding at right elbow and tapping to promote extension of the arm   Other Exercises 2 Patient seen for tone inhibition techniques in supine and sitting with trunk rotation, trunk dissociation, slow controlled forward flexion of the hips with arm extension.   Other Weight-Bearing Exercises 1 weight bearing to right UE with therapist assist for placement of the arm and hand as well as facilitation of weight shifting to produce weight bearing.  Cues to lean forward over wrist, weight shifts to the right with reaching with left arm towards right side to promote increased weight bearing.     Manual Therapy   Scapular Mobilization                  OT Education - 01/07/15 1918    Education provided Yes   Education Details exercises, functional use of right arm into daily tasks.   Person(s) Educated Patient   Methods Explanation;Demonstration;Verbal cues   Comprehension Verbalized understanding;Returned demonstration;Verbal cues required             OT Long Term Goals - 12/13/14 1423    OT LONG TERM GOAL #1   Title Patient will improve R UE function to be able to  complete bilateral ADL   Baseline Patient able to place both hands on large trash can and roll to the street now.   Time 8   Period Weeks   Status Partially Met   OT LONG TERM GOAL #2   Title Patient will be able to utilize RUE as an assist with washing the hood of his car    Time 8   Period Weeks   Status Achieved   OT LONG TERM GOAL #3   Title Patient will demo right shoulder ABD sufficient to apply deodorant independently   Time 8   Period Weeks   Status Achieved   OT LONG TERM GOAL #4   Title will improve supination of right hand by 20 degrees to help stabilize hand on rake for yardwork   Baseline Patient is now having to place hand on rake in a pronated position.   Time 8   Period Weeks   Status Partially Met    OT LONG TERM GOAL #5   Title Patient will improve right grip sufficient to stabilize garden tool in his hand to dig dirt for flowers    Baseline able to hold but not consistent yet with use   Time 8   Period Weeks   Status Partially Met   OT LONG TERM GOAL #6   Title Patient will complete folding of laundry using right hand as an assist during task    Baseline using right hand to help stabilize items but still lacks wrist extension of performing in standing.   Time 8   Period Weeks   Status Partially Met   OT LONG TERM GOAL #7   Title Patient will demonstrate the ability to hold a washcloth consistently and wash his lower legs and feet with modified independence.    Time 8   Period Weeks   Status New   OT LONG TERM GOAL #8   Title Patient will demonstrate secure grip on golf club to be able to putt around in his yard.   Time 8   Period Weeks   Status New               Plan - 01/07/15 2046    Clinical Impression Statement Patient continues to show results from Botox injections in combination with therapy to help decrease and normalize tone and to improve strength and ROM of RUE to be able to utilize extremity for daily necessary tasks.  Patient is consistent with performing exercises at home as directed but requires assistance from therapist with guiding and tone inhibition techniques to facilitate normal use patterns.    Pt will benefit from skilled therapeutic intervention in order to improve on the following deficits (Retired) Decreased coordination;Decreased range of motion;Impaired flexibility;Impaired tone;Impaired UE functional use;Pain;Decreased strength   Rehab Potential Good   Clinical Impairments Affecting Rehab Potential spasticity of the right side especially at the elbow for flexion   OT Frequency 2x / week   OT Duration 8 weeks   OT Treatment/Interventions Self-care/ADL training;Therapeutic exercise;Patient/family education;Neuromuscular education;Manual  Therapy;Therapeutic exercises;Electrical Stimulation;Passive range of motion   OT Home Exercise Plan Patient has established HEP from previous sessions and will continue to add to plan   Consulted and Agree with Plan of Care Patient        Problem List Patient Active Problem List   Diagnosis Date Noted  . Shoulder subluxation, right 04/23/2014  . Spastic hemiplegia affecting dominant side 02/18/2014  . HTN (hypertension) 01/17/2014  . Dyslipidemia 01/17/2014  .  CVA (cerebral infarction) 12/27/2013   Morgen Linebaugh T Tomasita Morrow, OTR/L, CLT Louay Myrie 01/08/2015, 8:51 PM  [place] MAIN [facility] SERVICES [address] [place], [place], [address] Phone: [phone]   Fax:  [phone]

## 2015-01-09 ENCOUNTER — Ambulatory Visit: Payer: BLUE CROSS/BLUE SHIELD | Admitting: Occupational Therapy

## 2015-01-09 DIAGNOSIS — G8191 Hemiplegia, unspecified affecting right dominant side: Secondary | ICD-10-CM

## 2015-01-09 DIAGNOSIS — IMO0002 Reserved for concepts with insufficient information to code with codable children: Secondary | ICD-10-CM

## 2015-01-09 DIAGNOSIS — M6281 Muscle weakness (generalized): Secondary | ICD-10-CM

## 2015-01-10 NOTE — Therapy (Signed)
[place] [facility] MAIN [facility] SERVICES [address] [place], [place], [address] Phone: [phone]   Fax:  [phone]  Occupational Therapy Treatment  Patient Details  Name: [name] MRN: [id number] Date of Birth: 11-Mar-1956 Referring Provider:  Ranelle Oyster, MD  Encounter Date: 01/09/2015      OT End of Session - 01/09/15 1300    Visit Number 88   Number of Visits 98   Date for OT Re-Evaluation 02/07/15   OT Start Time 1300   OT Stop Time 1350   OT Time Calculation (min) 50 min   Activity Tolerance Patient tolerated treatment well;No increased pain   Behavior During Therapy [facility] for tasks assessed/performed      Past Medical History  Diagnosis Date  . Stroke     Past Surgical History  Procedure Laterality Date  . Knee arthroscopy Right     There were no vitals filed for this visit.  Visit Diagnosis:  Hemiplegia affecting right dominant side  Lack of coordination due to stroke  Muscle weakness      Subjective Assessment - 01/09/15 1300    Subjective  Patient reports he worked his right arm hard last night, states, "I hope I can do some therapy today without my arm giving out."   Pertinent History Botox injections in right UE on 12-17-2014   Patient Stated Goals Patient wants to be independent with all self care and IADL tasks.   Currently in Pain? No/denies   Pain Score 0-No pain                      OT Treatments/Exercises (OP) - 01/09/15 1300    ADLs   Leisure Patient seen for applying grip to golf club for swing, instruction on hand placement of bike when he gets his stand especially to compensate for lack of wrist extension on the right.  Tool grips with broom and mop.   Neurological Re-education Exercises   Other Exercises 1 Continued focus this date on RUE AAROM for shoulder flexion, ABD, scaption, ADD, ER, elbow flexion/extension, supination and wrist extension.  Therapist guiding each exercises  and providing facilitation for elbow extension with reaching tasks and many of exercises.  Place and hold 1 trial for 2 minutes with shoulder at 90 degrees of flexion in supine and a 2nd trial for 1 min.  Patient engaging in scapular protraction, retraction and therapist manual assist with upwards rotation.  Manual wrist stretches prior to weight bearing tasks to increase ROM and decrease any pain.  Patient engaging in finger extension trials with cues and facilitation and tapping techniques.   Other Weight-Bearing Exercises 1 weight bearing to right UE with therapist assist for placement of the arm and hand as well as facilitation of weight shifting to produce weight bearing.  Cues to lean forward over wrist, weight shifts to the right with reaching with left arm towards right side to promote increased weight bearing.     Manual Therapy   Scapular Mobilization                  OT Education - 01/09/15 1300    Education provided Yes   Education Details HEP, functional use of arm in daily tasks, golf grip   Person(s) Educated Patient   Methods Explanation;Demonstration;Verbal cues   Comprehension Verbal cues required;Returned demonstration;Verbalized understanding             OT Long Term Goals - 12/13/14 1423  OT LONG TERM GOAL #1   Title Patient will improve R UE function to be able to complete bilateral ADL   Baseline Patient able to place both hands on large trash can and roll to the street now.   Time 8   Period Weeks   Status Partially Met   OT LONG TERM GOAL #2   Title Patient will be able to utilize RUE as an assist with washing the hood of his car    Time 8   Period Weeks   Status Achieved   OT LONG TERM GOAL #3   Title Patient will demo right shoulder ABD sufficient to apply deodorant independently   Time 8   Period Weeks   Status Achieved   OT LONG TERM GOAL #4   Title will improve supination of right hand by 20 degrees to help stabilize hand on rake for  yardwork   Baseline Patient is now having to place hand on rake in a pronated position.   Time 8   Period Weeks   Status Partially Met   OT LONG TERM GOAL #5   Title Patient will improve right grip sufficient to stabilize garden tool in his hand to dig dirt for flowers    Baseline able to hold but not consistent yet with use   Time 8   Period Weeks   Status Partially Met   OT LONG TERM GOAL #6   Title Patient will complete folding of laundry using right hand as an assist during task    Baseline using right hand to help stabilize items but still lacks wrist extension of performing in standing.   Time 8   Period Weeks   Status Partially Met   OT LONG TERM GOAL #7   Title Patient will demonstrate the ability to hold a washcloth consistently and wash his lower legs and feet with modified independence.    Time 8   Period Weeks   Status New   OT LONG TERM GOAL #8   Title Patient will demonstrate secure grip on golf club to be able to putt around in his yard.   Time 8   Period Weeks   Status New               Plan - 01/09/15 1350    Clinical Impression Statement Patient has continued to demo progress with RUE with therapeutic intervention along with [place] injections.  Continued focus on normalizing tone especially at the right elbow for patient to be able to perform tasks at home.   Pt will benefit from skilled therapeutic intervention in order to improve on the following deficits (Retired) Decreased coordination;Decreased range of motion;Impaired flexibility;Impaired tone;Impaired UE functional use;Pain;Decreased strength   Rehab Potential Good   Clinical Impairments Affecting Rehab Potential spasticity of the right side especially at the elbow for flexion   OT Frequency 2x / week   OT Duration 8 weeks   OT Treatment/Interventions Self-care/ADL training;Therapeutic exercise;Patient/family education;Neuromuscular education;Manual Therapy;Therapeutic exercises;Electrical  Stimulation;Passive range of motion   OT Home Exercise Plan Patient has established HEP from previous sessions and will continue to add to plan   Consulted and Agree with Plan of Care Patient        Problem List Patient Active Problem List   Diagnosis Date Noted  . Shoulder subluxation, right 04/23/2014  . Spastic hemiplegia affecting dominant side 02/18/2014  . HTN (hypertension) 01/17/2014  . Dyslipidemia 01/17/2014  . CVA (cerebral infarction) 12/27/2013    Chaquana Nichols 01/10/2015, 3:51  PM  [place] MAIN [facility] SERVICES [address] [place], [place], [address] Phone: [phone]   Fax:  [phone]

## 2015-01-14 ENCOUNTER — Ambulatory Visit: Payer: BLUE CROSS/BLUE SHIELD | Admitting: Occupational Therapy

## 2015-01-14 DIAGNOSIS — G8191 Hemiplegia, unspecified affecting right dominant side: Secondary | ICD-10-CM

## 2015-01-14 DIAGNOSIS — IMO0002 Reserved for concepts with insufficient information to code with codable children: Secondary | ICD-10-CM

## 2015-01-14 DIAGNOSIS — M6281 Muscle weakness (generalized): Secondary | ICD-10-CM

## 2015-01-15 NOTE — Therapy (Signed)
[place] MAIN [facility] SERVICES [address] [place], [place], [address] Phone: [phone]   Fax:  [phone]  Occupational Therapy Treatment  Patient Details  Name: [name] MRN: [id number] Date of Birth: Aug 04, 1955 Referring Provider:  Meredith Staggers, MD  Encounter Date: 01/14/2015      OT End of Session - 01/14/15 1624    Visit Number 28   Number of Visits 3   Date for OT Re-Evaluation 02/07/15   OT Start Time 1105   OT Stop Time 1150   OT Time Calculation (min) 45 min   Activity Tolerance Patient tolerated treatment well;No increased pain   Behavior During Therapy [facility] for tasks assessed/performed      Past Medical History  Diagnosis Date  . Stroke     Past Surgical History  Procedure Laterality Date  . Knee arthroscopy Right     There were no vitals filed for this visit.  Visit Diagnosis:  Hemiplegia affecting right dominant side  Lack of coordination due to stroke  Muscle weakness      Subjective Assessment - 01/14/15 1623    Subjective  Patient reports he has really been working as much as possible with his arm.  Feels his fingers are a bit looser.   Pertinent History Botox injections in right UE on 12-17-2014   Patient Stated Goals Patient wants to be independent with all self care and IADL tasks.   Currently in Pain? No/denies   Pain Score 0-No pain                      OT Treatments/Exercises (OP) - 01/14/15 1624    Neurological Re-education Exercises   Other Exercises 1 Patient seen for RUE joint mobs to R shoulder at [facility] joint for inferior/posterior glides, grade II to improve ROM, followed by AAROM for R shoulder flexion, ABD, ADD, ER, Elbow flexion/extension, supination and finger extension with facilitation.  Emphasis on elbow extension with reaching tasks and requires verbal cues along with facilitation.  Finger extension slow to improve and see more progress with right ring finger.   Trunk dissociation techniques for tone inhibition.   Other Exercises 2 Sitting:  active assistive reaching tasks in sitting with facilitation at the right elbow.    Other Weight-Bearing Exercises 1 weight bearing to right UE with outstretched hand with use of dycem to keep hand in a stable position, then instructed to weight shift to right side with left hand reaching across body to help facilitate.                OT Education - 01/14/15 1624    Education provided Yes   Education Details continued focus on finger extension, use of dycem to assist with weight bearing in sitting   Methods Explanation;Demonstration;Verbal cues   Comprehension Verbalized understanding;Returned demonstration;Verbal cues required             OT Long Term Goals - 12/13/14 1423    OT LONG TERM GOAL #1   Title Patient will improve R UE function to be able to complete bilateral ADL   Baseline Patient able to place both hands on large trash can and roll to the street now.   Time 8   Period Weeks   Status Partially Met   OT LONG TERM GOAL #2   Title Patient will be able to utilize RUE as an assist with washing the hood of his car    Time 8  Period Weeks   Status Achieved   OT LONG TERM GOAL #3   Title Patient will demo right shoulder ABD sufficient to apply deodorant independently   Time 8   Period Weeks   Status Achieved   OT LONG TERM GOAL #4   Title will improve supination of right hand by 20 degrees to help stabilize hand on rake for yardwork   Baseline Patient is now having to place hand on rake in a pronated position.   Time 8   Period Weeks   Status Partially Met   OT LONG TERM GOAL #5   Title Patient will improve right grip sufficient to stabilize garden tool in his hand to dig dirt for flowers    Baseline able to hold but not consistent yet with use   Time 8   Period Weeks   Status Partially Met   OT LONG TERM GOAL #6   Title Patient will complete folding of laundry using right  hand as an assist during task    Baseline using right hand to help stabilize items but still lacks wrist extension of performing in standing.   Time 8   Period Weeks   Status Partially Met   OT LONG TERM GOAL #7   Title Patient will demonstrate the ability to hold a washcloth consistently and wash his lower legs and feet with modified independence.    Time 8   Period Weeks   Status New   OT LONG TERM GOAL #8   Title Patient will demonstrate secure grip on golf club to be able to putt around in his yard.   Time 8   Period Weeks   Status New               Plan - 01/14/15 1625    Clinical Impression Statement Patient still has difficulty with getting his right hand in a position of weight bearing in sitting, hand tends to slide forwards.  Issued and instructed on use of dycem .  Patient to try at home and see if he can work towards increased wrist extension and consistent weight bearing positions.   Pt will benefit from skilled therapeutic intervention in order to improve on the following deficits (Retired) Decreased coordination;Decreased range of motion;Impaired flexibility;Impaired tone;Impaired UE functional use;Pain;Decreased strength   Rehab Potential Good   Clinical Impairments Affecting Rehab Potential spasticity of the right side especially at the elbow for flexion   OT Frequency 2x / week   OT Duration 8 weeks   OT Treatment/Interventions Self-care/ADL training;Therapeutic exercise;Patient/family education;Neuromuscular education;Manual Therapy;Therapeutic exercises;Electrical Stimulation;Passive range of motion   OT Home Exercise Plan Patient has established HEP from previous sessions and will continue to add to plan   Consulted and Agree with Plan of Care Patient        Problem List Patient Active Problem List   Diagnosis Date Noted  . Shoulder subluxation, right 04/23/2014  . Spastic hemiplegia affecting dominant side 02/18/2014  . HTN (hypertension) 01/17/2014   . Dyslipidemia 01/17/2014  . CVA (cerebral infarction) 12/27/2013   Amy T Lovett, OTR/L, CLT Lovett,Amy 01/15/2015, 3:25 PM  [place] [facility] MAIN REHAB SERVICES [address] [place], [place], [address] Phone: [phone]   Fax:  [phone]    

## 2015-01-16 ENCOUNTER — Ambulatory Visit: Payer: BLUE CROSS/BLUE SHIELD | Admitting: Occupational Therapy

## 2015-01-16 DIAGNOSIS — G8191 Hemiplegia, unspecified affecting right dominant side: Secondary | ICD-10-CM

## 2015-01-16 DIAGNOSIS — M6281 Muscle weakness (generalized): Secondary | ICD-10-CM

## 2015-01-16 DIAGNOSIS — IMO0002 Reserved for concepts with insufficient information to code with codable children: Secondary | ICD-10-CM

## 2015-01-17 NOTE — Therapy (Signed)
[place] MAIN [facility] SERVICES [address] [place], [place], [address] Phone: [phone]   Fax:  [phone]  Occupational Therapy Treatment  Patient Details  Name: [name] MRN: [id number] Date of Birth: December 26, 1955 Referring Provider:  Meredith Staggers, MD  Encounter Date: 01/16/2015      OT End of Session - 01/16/15 2108    Visit Number 90   Number of Visits 98   Date for OT Re-Evaluation 02/07/15   OT Start Time 9622   OT Stop Time 1355   OT Time Calculation (min) 50 min   Equipment Utilized During Treatment ace wrap and cone for supination combined with grip   Activity Tolerance Patient tolerated treatment well;No increased pain   Behavior During Therapy [facility] for tasks assessed/performed      Past Medical History  Diagnosis Date  . Stroke     Past Surgical History  Procedure Laterality Date  . Knee arthroscopy Right     There were no vitals filed for this visit.  Visit Diagnosis:  Hemiplegia affecting right dominant side  Lack of coordination due to stroke  Muscle weakness      Subjective Assessment - 01/16/15 2105    Subjective  Patient reports he continues to work on his exercises, wishes he could get to holding a golf club better and with good technique.   Pertinent History Botox injections in right UE on 12-17-2014   Patient Stated Goals Patient wants to be independent with all self care and IADL tasks.   Currently in Pain? No/denies   Pain Score 0-No pain                      OT Treatments/Exercises (OP) - 01/16/15 2106    ADLs   ADL Comments Patient instructed on golf club grip this date with multiple trials and adjustments to produce hold.  He lacks grip strength and supination sufficient to have a secure hold this date.  Trials of grip of lightweight cone with ace wrapping around and working towards active supination with hand in this gripping position.  Patient to take cone and wrap home  and try over the next few days to work towards use.   Neurological Re-education Exercises   Other Exercises 1 Patient seen for RUE joint mobs to R shoulder at [facility] joint for inferior/posterior glides, grade II to improve ROM, followed by [facility] for R shoulder flexion, ABD, ADD, ER, Elbow flexion/extension, supination and finger extension with facilitation. Emphasis on elbow extension with reaching tasks and requires verbal cues along with facilitation.  Finger extension trials with faciliitation from therapist.    Other Weight-Bearing Exercises 1 weight bearing to right UE with outstretched hand with use of dycem to keep hand in a stable position, then instructed to weight shift to right side with left hand reaching across body to help facilitate.                OT Education - 01/16/15 2107    Education provided Yes   Education Details HEP, golf club grip and stretch towards supination with use of cone and wrap.   Person(s) Educated Patient   Methods Explanation;Demonstration;Verbal cues   Comprehension Verbal cues required;Returned demonstration;Verbalized understanding             OT Long Term Goals - 12/13/14 1423    OT LONG TERM GOAL #1   Title Patient will improve R UE function to be able to complete bilateral  ADL   Baseline Patient able to place both hands on large trash can and roll to the street now.   Time 8   Period Weeks   Status Partially Met   OT LONG TERM GOAL #2   Title Patient will be able to utilize RUE as an assist with washing the hood of his car    Time 8   Period Weeks   Status Achieved   OT LONG TERM GOAL #3   Title Patient will demo right shoulder ABD sufficient to apply deodorant independently   Time 8   Period Weeks   Status Achieved   OT LONG TERM GOAL #4   Title will improve supination of right hand by 20 degrees to help stabilize hand on rake for yardwork   Baseline Patient is now having to place hand on rake in a pronated position.   Time 8    Period Weeks   Status Partially Met   OT LONG TERM GOAL #5   Title Patient will improve right grip sufficient to stabilize garden tool in his hand to dig dirt for flowers    Baseline able to hold but not consistent yet with use   Time 8   Period Weeks   Status Partially Met   OT LONG TERM GOAL #6   Title Patient will complete folding of laundry using right hand as an assist during task    Baseline using right hand to help stabilize items but still lacks wrist extension of performing in standing.   Time 8   Period Weeks   Status Partially Met   OT LONG TERM GOAL #7   Title Patient will demonstrate the ability to hold a washcloth consistently and wash his lower legs and feet with modified independence.    Time 8   Period Weeks   Status New   OT LONG TERM GOAL #8   Title Patient will demonstrate secure grip on golf club to be able to putt around in his yard.   Time 8   Period Weeks   Status New               Plan - 01/16/15 2109    Clinical Impression Statement Patient continues to work on supination skills of the right hand to be able to hold tools such as golf club, broom, and weedeater.  Grip is weak to hold items and he cannot hold a 1# weight this date but asking about performing some type of weights. May try the tower next session to see if we can produce a triceps press in standing with facilitation at the elbow.  Patient would like to be able to hold golf club to hit a few balls in the backyard but realizes he may never play on a course again.      Pt will benefit from skilled therapeutic intervention in order to improve on the following deficits (Retired) Decreased coordination;Decreased range of motion;Impaired flexibility;Impaired tone;Impaired UE functional use;Pain;Decreased strength   Rehab Potential Good   Clinical Impairments Affecting Rehab Potential spasticity of the right side especially at the elbow for flexion   OT Frequency 2x / week   OT Duration 8 weeks    OT Treatment/Interventions Self-care/ADL training;Therapeutic exercise;Patient/family education;Neuromuscular education;Manual Therapy;Therapeutic exercises;Electrical Stimulation;Passive range of motion   OT Home Exercise Plan Patient has established HEP from previous sessions and will continue to add to plan   Consulted and Agree with Plan of Care Patient  Problem List Patient Active Problem List   Diagnosis Date Noted  . Shoulder subluxation, right 04/23/2014  . Spastic hemiplegia affecting dominant side 02/18/2014  . HTN (hypertension) 01/17/2014  . Dyslipidemia 01/17/2014  . CVA (cerebral infarction) 12/27/2013   Jaziah Kwasnik T Tomasita Morrow, OTR/L, CLT Bron Snellings 01/17/2015, 9:26 AM  [place] MAIN [facility] SERVICES [address] [place], [place], [address] Phone: [phone]   Fax:  [phone]

## 2015-01-21 ENCOUNTER — Ambulatory Visit: Payer: BLUE CROSS/BLUE SHIELD | Admitting: Occupational Therapy

## 2015-01-21 DIAGNOSIS — IMO0002 Reserved for concepts with insufficient information to code with codable children: Secondary | ICD-10-CM

## 2015-01-21 DIAGNOSIS — M6281 Muscle weakness (generalized): Secondary | ICD-10-CM

## 2015-01-21 DIAGNOSIS — G8191 Hemiplegia, unspecified affecting right dominant side: Secondary | ICD-10-CM

## 2015-01-22 NOTE — Therapy (Signed)
[place] MAIN [facility] SERVICES [address] [place], [place], [address] Phone: [phone]   Fax:  [phone]  Occupational Therapy Treatment  Patient Details  Name: [name] MRN: [id number] Date of Birth: January 20, 1956 Referring Provider:  Meredith Staggers, MD  Encounter Date: 01/21/2015      OT End of Session - 01/22/15 1456    Visit Number 91   Number of Visits 98   Date for OT Re-Evaluation 02/07/15   OT Start Time 1315   OT Stop Time 1401   OT Time Calculation (min) 46 min   Activity Tolerance Patient tolerated treatment well;No increased pain   Behavior During Therapy [facility] for tasks assessed/performed      Past Medical History  Diagnosis Date  . Stroke     Past Surgical History  Procedure Laterality Date  . Knee arthroscopy Right     There were no vitals filed for this visit.  Visit Diagnosis:  Hemiplegia affecting right dominant side  Lack of coordination due to stroke  Muscle weakness      Subjective Assessment - 01/21/15 1454    Subjective  Patient reports he will be following up with his neurologist this week, on Thursday afternoon.  Feels he is still making some good progress but elbow is still difficult to achieve active extension in full range.  Not sure if the Botox helped the elbow much but feels his hand is a bit looser.   Pertinent History Botox injections in right UE on 12-17-2014   Patient Stated Goals Patient wants to be independent with all self care and IADL tasks.   Currently in Pain? No/denies   Pain Score 0-No pain                      OT Treatments/Exercises (OP) - 01/22/15 1510    ADLs   ADL Comments Instructed on grip with variety of tools and assessment of skill use. Improved use of grip for broom and rake with right hand.   Neurological Re-education Exercises   Scapular Stabilization Right;20 reps;Supine   Other Exercises 1 AAROM for R shoulder flexion, ABD, ADD, ER, Elbow  flexion/extension, supination and finger extension with facilitation. Emphasis on elbow extension with reaching tasks and requires verbal cues along with facilitation. Finger extension trials with faciliitation from therapist.   Other Exercises 2 Gross grasp and release of items with guiding and facilitation from therapist to obtain items, target reaching with right hand with cues and guiding.   Other Weight-Bearing Exercises 1 weight bearing to right UE with outstretched hand with use of dycem to keep hand in a stable position, then instructed to weight shift to right side with left hand reaching across body to help facilitate.Wrist stretches by therapist to improve ROM in the wrist and hand on the right.                OT Education - 01/21/15 1455    Education provided Yes   Education Details Continued focus on weight bearing and reaching tasks, grip on tools   Person(s) Educated Patient   Methods Explanation;Demonstration;Verbal cues   Comprehension Verbal cues required;Returned demonstration;Verbalized understanding             OT Long Term Goals - 12/13/14 1423    OT LONG TERM GOAL #1   Title Patient will improve R UE function to be able to complete bilateral ADL   Baseline Patient able to place both hands on  large trash can and roll to the street now.   Time 8   Period Weeks   Status Partially Met   OT LONG TERM GOAL #2   Title Patient will be able to utilize RUE as an assist with washing the hood of his car    Time 8   Period Weeks   Status Achieved   OT LONG TERM GOAL #3   Title Patient will demo right shoulder ABD sufficient to apply deodorant independently   Time 8   Period Weeks   Status Achieved   OT LONG TERM GOAL #4   Title will improve supination of right hand by 20 degrees to help stabilize hand on rake for yardwork   Baseline Patient is now having to place hand on rake in a pronated position.   Time 8   Period Weeks   Status Partially Met   OT LONG  TERM GOAL #5   Title Patient will improve right grip sufficient to stabilize garden tool in his hand to dig dirt for flowers    Baseline able to hold but not consistent yet with use   Time 8   Period Weeks   Status Partially Met   OT LONG TERM GOAL #6   Title Patient will complete folding of laundry using right hand as an assist during task    Baseline using right hand to help stabilize items but still lacks wrist extension of performing in standing.   Time 8   Period Weeks   Status Partially Met   OT LONG TERM GOAL #7   Title Patient will demonstrate the ability to hold a washcloth consistently and wash his lower legs and feet with modified independence.    Time 8   Period Weeks   Status New   OT LONG TERM GOAL #8   Title Patient will demonstrate secure grip on golf club to be able to putt around in his yard.   Time 8   Period Weeks   Status New               Plan - 01/21/15 1457    Clinical Impression Statement Patient has continued to make progress with right UE, decreased spasticity in fingers on the right but also decreased grasp since having Botox injections.  He is remaining active at home in his daily tasks, around the home and yard.  He still continues to struggle with tool grips for using golf clubs, hammer and screw driver but has improved with use of right hand with broom and rake but has difficulty maintaining grasp and putting power behind the tool for intended use.  Will reassess next session to update MD.   Pt will benefit from skilled therapeutic intervention in order to improve on the following deficits (Retired) Decreased coordination;Decreased range of motion;Impaired flexibility;Impaired tone;Impaired UE functional use;Pain;Decreased strength   Rehab Potential Good   Clinical Impairments Affecting Rehab Potential spasticity of the right side especially at the elbow for flexion   OT Frequency 2x / week   OT Duration 8 weeks   OT Treatment/Interventions  Self-care/ADL training;Therapeutic exercise;Patient/family education;Neuromuscular education;Manual Therapy;Therapeutic exercises;Electrical Stimulation;Passive range of motion   OT Home Exercise Plan Patient has established HEP from previous sessions and will continue to add to plan   Consulted and Agree with Plan of Care Patient        Problem List Patient Active Problem List   Diagnosis Date Noted  . Shoulder subluxation, right 04/23/2014  . Spastic hemiplegia affecting  dominant side 02/18/2014  . HTN (hypertension) 01/17/2014  . Dyslipidemia 01/17/2014  . CVA (cerebral infarction) 12/27/2013   Daphyne Miguez T Tomasita Morrow, OTR/L, CLT  Davion Meara 01/22/2015, 3:21 PM  [place] MAIN [facility] SERVICES [address] [place], [place], [address] Phone: [phone]   Fax:  [phone]

## 2015-01-23 ENCOUNTER — Encounter: Payer: Self-pay | Admitting: Physical Medicine & Rehabilitation

## 2015-01-23 ENCOUNTER — Encounter: Payer: Self-pay | Attending: Physical Medicine & Rehabilitation

## 2015-01-23 ENCOUNTER — Ambulatory Visit (HOSPITAL_BASED_OUTPATIENT_CLINIC_OR_DEPARTMENT_OTHER): Payer: BLUE CROSS/BLUE SHIELD | Admitting: Physical Medicine & Rehabilitation

## 2015-01-23 ENCOUNTER — Ambulatory Visit: Payer: BLUE CROSS/BLUE SHIELD | Admitting: Registered Nurse

## 2015-01-23 ENCOUNTER — Ambulatory Visit: Payer: BLUE CROSS/BLUE SHIELD | Attending: Physical Medicine & Rehabilitation | Admitting: Occupational Therapy

## 2015-01-23 VITALS — BP 152/93 | HR 89 | Resp 17

## 2015-01-23 DIAGNOSIS — R279 Unspecified lack of coordination: Secondary | ICD-10-CM | POA: Diagnosis present

## 2015-01-23 DIAGNOSIS — I69351 Hemiplegia and hemiparesis following cerebral infarction affecting right dominant side: Secondary | ICD-10-CM | POA: Insufficient documentation

## 2015-01-23 DIAGNOSIS — IMO0002 Reserved for concepts with insufficient information to code with codable children: Secondary | ICD-10-CM

## 2015-01-23 DIAGNOSIS — G8110 Spastic hemiplegia affecting unspecified side: Secondary | ICD-10-CM

## 2015-01-23 DIAGNOSIS — G8191 Hemiplegia, unspecified affecting right dominant side: Secondary | ICD-10-CM | POA: Insufficient documentation

## 2015-01-23 DIAGNOSIS — M25511 Pain in right shoulder: Secondary | ICD-10-CM | POA: Insufficient documentation

## 2015-01-23 DIAGNOSIS — M6281 Muscle weakness (generalized): Secondary | ICD-10-CM

## 2015-01-23 DIAGNOSIS — Z7982 Long term (current) use of aspirin: Secondary | ICD-10-CM | POA: Insufficient documentation

## 2015-01-23 DIAGNOSIS — M24521 Contracture, right elbow: Secondary | ICD-10-CM

## 2015-01-23 DIAGNOSIS — I6980 Unspecified sequelae of other cerebrovascular disease: Secondary | ICD-10-CM | POA: Diagnosis present

## 2015-01-23 NOTE — Patient Instructions (Signed)
We will reassess you in 4-6 weeks. If your hand is getting tighter that would be a good indication for repeat Botox injection.

## 2015-01-23 NOTE — Progress Notes (Signed)
Subjective:    Patient ID: [name], male    DOB: 03-Oct-1955, 59 y.o.   MRN: [id number]  HPI Patient returns after Botox injection 12/17/2014 FCR 50 FCU 50 FDS 50 FDP 50 PT 50 Brachioradialis 50 Biceps 100 units  His right fingers feel looser but not his wrist or his elbow Pain Inventory Average Pain 1 Pain Right Now 0 My pain is intermittent  In the last 24 hours, has pain interfered with the following? General activity 0 Relation with others 0 Enjoyment of life 0 What TIME of day is your pain at its worst? night Sleep (in general) Good  Pain is worse with: na Pain improves with: na Relief from Meds: na  Mobility walk without assistance use a cane how many minutes can you walk? 5 ability to climb steps?  yes do you drive?  yes Do you have any goals in this area?  yes  Function disabled: date disabled 12-25-14 I need assistance with the following:  meal prep Do you have any goals in this area?  yes  Neuro/Psych No problems in this area  Prior Studies Any changes since last visit?  no  Physicians involved in your care Any changes since last visit?  no   Family History  Problem Relation Age of Onset  . Hypertension Mother    Social History   Social History  . Marital Status: Married    Spouse Name: N/A  . Number of Children: N/A  . Years of Education: N/A   Social History Main Topics  . Smoking status: Never Smoker   . Smokeless tobacco: None  . Alcohol Use: No  . Drug Use: No  . Sexual Activity: Not Asked   Other Topics Concern  . None   Social History Narrative   Past Surgical History  Procedure Laterality Date  . Knee arthroscopy Right    Past Medical History  Diagnosis Date  . Stroke    BP 152/93 mmHg  Pulse 89  Resp 17  SpO2 99%  Opioid Risk Score:   Fall Risk Score:  `1  Depression screen PHQ 2/9  Depression screen [facility] 2/9 01/23/2015 12/17/2014 08/15/2014  Decreased Interest 0 0 0  Down, Depressed, Hopeless 0 0 0    PHQ - 2 Score 0 0 0  Altered sleeping - - 0  Tired, decreased energy - - 0  Change in appetite - - 0  Feeling bad or failure about yourself  - - 0  Trouble concentrating - - 0  Moving slowly or fidgety/restless - - 0  Suicidal thoughts - - 0  PHQ-9 Score - - 0      Review of Systems  All other systems reviewed and are negative.      Objective:   Physical Exam  Constitutional: He is oriented to person, place, and time. He appears well-developed and well-nourished.  HENT:  Head: Normocephalic and atraumatic.  Eyes: Conjunctivae and EOM are normal. Pupils are equal, round, and reactive to light.  Neck: Normal range of motion.  Neurological: He is alert and oriented to person, place, and time.  Psychiatric: He has a normal mood and affect.  Nursing note and vitals reviewed.   Ashworth score 3 at the biceps, 3 at the wrist flexors, 1-2 at the finger flexors Motor strength is 3 minus at the deltoid to minus at the biceps triceps Ambulates without an assistive device he does use a right AFO  30 right elbow contracture    Assessment &  Plan:  1. Right spastic hemiplegia secondary to left CVA. He's had improvement and finger flexor spasticity status post botulinum toxin injection. His biceps tone is still increased he does have underlying elbow contracture as well  We discussed his therapy progress as well as overall prognosis. He has one year post stroke so we expect minimal further  Functional or strength improvements. I would encourage him to continue with his home Exercise program finish out his OT PT.  2. 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 At this point he has functional range would not refer to orthopedics for release  We'll follow-up in 4 weeks. At that point if his spasticity is worsening once again would recommend repeat Botox.  Would reduce elbow flexor dosage and increased wrist and finger flexor dosage  Discussed with patient agrees with  plan

## 2015-01-24 NOTE — Therapy (Signed)
[place] MAIN [facility] SERVICES [address] [place], [place], [address] Phone: [phone]   Fax:  [phone]  Occupational Therapy Treatment  Patient Details  Name: [name] MRN: [id number] Date of Birth: December 19, 1955 Referring Provider:  Meredith Staggers, MD  Encounter Date: 01/23/2015      OT End of Session - 01/23/15 1137    Visit Number 39   Date for OT Re-Evaluation 02/07/15   OT Start Time 1057   Activity Tolerance Patient tolerated treatment well;No increased pain   Behavior During Therapy [facility] for tasks assessed/performed      Past Medical History  Diagnosis Date  . Stroke     Past Surgical History  Procedure Laterality Date  . Knee arthroscopy Right     There were no vitals filed for this visit.  Visit Diagnosis:  Hemiplegia affecting right dominant side  Lack of coordination due to stroke  Muscle weakness      Subjective Assessment - 01/23/15 1136    Subjective  Patient reports he is going to the doctor today, has not had the best morning but feels better after exercises in the clinic.  Reports he is improving with tasks around the house such as holding the rake and broom, moving items, reaching and washing his left side.    Pertinent History Botox injections in right UE on 12-17-2014   Patient Stated Goals Patient wants to be independent with all self care and IADL tasks.   Currently in Pain? No/denies   Pain Score 0-No pain                      OT Treatments/Exercises (OP) - 01/23/15 1430    ADLs   ADL Comments see therapist comments for ADL assessment.   Neurological Re-education Exercises   Other Exercises 1 Reassessment of RUE this date as follows:  supine active shoulder flexion to 130 degrees but still has elbow in flexion, 150 degrees passively.  sitting active ROM shoulder flexion to 85 degrees with flexion/ABD combo and mass trunk movement.  ABD in supine to 80 degrees, 104 passive.   Elbow flexion to 130 degrees active, extension -40 degrees and passive ROM to 0 degrees.  Wrist extension to -15 degrees,actively.  Passive wrist extension to 60 degrees.  Finger extension ring finger with most motion, PIP at -30 degrees, index PIP -52 degrees and middle finger -70 degrees.  Right grip strength 4#.     Other Exercises 2 Patient seen for RUE ROM and facilitation of movement to utilize in ADL tasks as an assist at this point. Supine AAROM for shoulder flexion for place and hold for 10 secs with therapist assist to guide arm and assist with placement. Cues for patient to control the arm with decent from flexion. Shoulder ABD/ADD, external rotation, forearm supination, elbow flexion/extension. ADD to reach across body to opposite shoulder and opposite hip with cues and facilitation at the elbow. Sitting, patient seen for forward flexion with reach between legs towards floor to pick up item using lateral pinch on the right.    Other Weight-Bearing Exercises 1 weight bearing to right UE with outstretched hand with use of dycem to keep hand in a stable position, then instructed to weight shift to right side with left hand reaching across body to help facilitate.Wrist stretches by therapist to improve ROM in the wrist and hand on the right.  OT Education - 01/23/15 1137    Education provided Yes   Education Details HEP, weight bearing, ADLs   Person(s) Educated Patient   Methods Explanation;Demonstration;Verbal cues   Comprehension Returned demonstration;Verbalized understanding;Verbal cues required             OT Long Term Goals - 12/13/14 1423    OT LONG TERM GOAL #1   Title Patient will improve R UE function to be able to complete bilateral ADL   Baseline Patient able to place both hands on large trash can and roll to the street now.   Time 8   Period Weeks   Status Partially Met   OT LONG TERM GOAL #2   Title Patient will be able to utilize RUE as an  assist with washing the hood of his car    Time 8   Period Weeks   Status Achieved   OT LONG TERM GOAL #3   Title Patient will demo right shoulder ABD sufficient to apply deodorant independently   Time 8   Period Weeks   Status Achieved   OT LONG TERM GOAL #4   Title will improve supination of right hand by 20 degrees to help stabilize hand on rake for yardwork   Baseline Patient is now having to place hand on rake in a pronated position.   Time 8   Period Weeks   Status Partially Met   OT LONG TERM GOAL #5   Title Patient will improve right grip sufficient to stabilize garden tool in his hand to dig dirt for flowers    Baseline able to hold but not consistent yet with use   Time 8   Period Weeks   Status Partially Met   OT LONG TERM GOAL #6   Title Patient will complete folding of laundry using right hand as an assist during task    Baseline using right hand to help stabilize items but still lacks wrist extension of performing in standing.   Time 8   Period Weeks   Status Partially Met   OT LONG TERM GOAL #7   Title Patient will demonstrate the ability to hold a washcloth consistently and wash his lower legs and feet with modified independence.    Time 8   Period Weeks   Status New   OT LONG TERM GOAL #8   Title Patient will demonstrate secure grip on golf club to be able to putt around in his yard.   Time 8   Period Weeks   Status New               Plan - 01/23/15 1138    Clinical Impression Statement (p) Patient has continued to progress with his right UE with passive and active movement.  Shoulder flexion actively in supine and sitting have improved by 20 degrees of motion.  In sitting shoulder motion is more of a mass movement for flexion, ABD combo.  Remains limited for active elbow extension which limits reaching tasks.  Grip has weakened slightly since Botox injections but still around 4-5# of pressure.  We have not worked on grip except grossly since patient  has spasticity.  Patient is now able to hold items better, able to hold and manuever the grocery cart in the store better, he has been practicing at home in the car with placing arm on steering wheel, was able to grossly move a speaker and could wrap his right arm around it.  ADLs improved with bathing with  use of right arm to wash left side, able to reach and scratch his left arm now, able to pull pant strings and tie when dressing.  He still has difficulty with golf club grip, unable to hold cell phone, difficulty with weight bearing in standing due to lack of full wrist extension.  We will plan to complete 5-6 more sessions and may discharge with a home program unless additional progress is noted with Botox injections.     Pt will benefit from skilled therapeutic intervention in order to improve on the following deficits (Retired) (p) Decreased coordination;Decreased range of motion;Impaired flexibility;Impaired tone;Impaired UE functional use;Pain;Decreased strength   Rehab Potential (p) Good   Clinical Impairments Affecting Rehab Potential (p) spasticity of the right side especially at the elbow for flexion   OT Frequency (p) 2x / week   OT Duration (p) 8 weeks   OT Treatment/Interventions (p) Self-care/ADL training;Therapeutic exercise;Patient/family education;Neuromuscular education;Manual Therapy;Therapeutic exercises;Electrical Stimulation;Passive range of motion   OT Home Exercise Plan (p) Patient has established HEP from previous sessions and will continue to add to plan   Consulted and Agree with Plan of Care (p) Patient        Problem List Patient Active Problem List   Diagnosis Date Noted  . 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 Shoulder subluxation, right 04/23/2014  . Spastic hemiplegia affecting dominant side 02/18/2014  . HTN (hypertension) 01/17/2014  . Dyslipidemia 01/17/2014  . CVA (cerebral infarction) 12/27/2013   Noella Kipnis T Tomasita Morrow, OTR/L,  CLT Micheil Klaus 01/24/2015, 11:01 AM  [place] MAIN [facility] SERVICES [address] [place], [place], [address] Phone: [phone]   Fax:  [phone]

## 2015-01-24 NOTE — Therapy (Deleted)
[place] MAIN [facility] SERVICES [address] [place], [place], [address] Phone: [phone]   Fax:  [phone]  Occupational Therapy Treatment  Patient Details  Name: [name] MRN: [id number] Date of Birth: 1956-01-11 Referring Provider:  Meredith Staggers, MD  Encounter Date: 01/23/2015      OT End of Session - 01/23/15 1137    Visit Number 85   Date for OT Re-Evaluation 02/07/15   OT Start Time 1057   Activity Tolerance Patient tolerated treatment well;No increased pain   Behavior During Therapy [facility] for tasks assessed/performed      Past Medical History  Diagnosis Date  . Stroke     Past Surgical History  Procedure Laterality Date  . Knee arthroscopy Right     There were no vitals filed for this visit.  Visit Diagnosis:  No diagnosis found.      Subjective Assessment - 01/23/15 1136    Subjective  Patient reports he is going to the doctor today, has not had the best morning but feels better after exercises in the clinic.  Reports he is improving with tasks around the house such as holding the rake and broom, moving items, reaching and washing his left side.    Pertinent History Botox injections in right UE on 12-17-2014   Patient Stated Goals Patient wants to be independent with all self care and IADL tasks.   Currently in Pain? No/denies   Pain Score 0-No pain                      OT Treatments/Exercises (OP) - 01/23/15 1430    ADLs   ADL Comments see therapist comments for ADL assessment.   Neurological Re-education Exercises   Other Exercises 1 Reassessment of RUE this date as follows:  supine active shoulder flexion to 130 degrees but still has elbow in flexion, 150 degrees passively.  sitting active ROM shoulder flexion to 85 degrees with flexion/ABD combo and mass trunk movement.  ABD in supine to 80 degrees, 104 passive.  Elbow flexion to 130 degrees active, extension -40 degrees and passive ROM to 0  degrees.  Wrist extension to -15 degrees,actively.  Passive wrist extension to 60 degrees.  Finger extension ring finger with most motion, PIP at -30 degrees, index PIP -52 degrees and middle finger -70 degrees.  Right grip strength 4#.     Other Exercises 2 Patient seen for RUE ROM and facilitation of movement to utilize in ADL tasks as an assist at this point. Supine AAROM for shoulder flexion for place and hold for 10 secs with therapist assist to guide arm and assist with placement. Cues for patient to control the arm with decent from flexion. Shoulder ABD/ADD, external rotation, forearm supination, elbow flexion/extension. ADD to reach across body to opposite shoulder and opposite hip with cues and facilitation at the elbow. Sitting, patient seen for forward flexion with reach between legs towards floor to pick up item using lateral pinch on the right.    Other Weight-Bearing Exercises 1 weight bearing to right UE with outstretched hand with use of dycem to keep hand in a stable position, then instructed to weight shift to right side with left hand reaching across body to help facilitate.Wrist stretches by therapist to improve ROM in the wrist and hand on the right.                OT Education - 01/23/15 1137    Education provided  Yes   Education Details HEP, weight bearing, ADLs   Person(s) Educated Patient   Methods Explanation;Demonstration;Verbal cues   Comprehension Returned demonstration;Verbalized understanding;Verbal cues required             OT Long Term Goals - 12/13/14 1423    OT LONG TERM GOAL #1   Title Patient will improve R UE function to be able to complete bilateral ADL   Baseline Patient able to place both hands on large trash can and roll to the street now.   Time 8   Period Weeks   Status Partially Met   OT LONG TERM GOAL #2   Title Patient will be able to utilize RUE as an assist with washing the hood of his car    Time 8   Period Weeks   Status  Achieved   OT LONG TERM GOAL #3   Title Patient will demo right shoulder ABD sufficient to apply deodorant independently   Time 8   Period Weeks   Status Achieved   OT LONG TERM GOAL #4   Title will improve supination of right hand by 20 degrees to help stabilize hand on rake for yardwork   Baseline Patient is now having to place hand on rake in a pronated position.   Time 8   Period Weeks   Status Partially Met   OT LONG TERM GOAL #5   Title Patient will improve right grip sufficient to stabilize garden tool in his hand to dig dirt for flowers    Baseline able to hold but not consistent yet with use   Time 8   Period Weeks   Status Partially Met   OT LONG TERM GOAL #6   Title Patient will complete folding of laundry using right hand as an assist during task    Baseline using right hand to help stabilize items but still lacks wrist extension of performing in standing.   Time 8   Period Weeks   Status Partially Met   OT LONG TERM GOAL #7   Title Patient will demonstrate the ability to hold a washcloth consistently and wash his lower legs and feet with modified independence.    Time 8   Period Weeks   Status New   OT LONG TERM GOAL #8   Title Patient will demonstrate secure grip on golf club to be able to putt around in his yard.   Time 8   Period Weeks   Status New               Plan - 01/23/15 1138    Clinical Impression Statement (p) Patient has continued to progress with his right UE with passive and active movement.  Shoulder flexion actively in supine and sitting have improved by 20 degrees of motion.  In sitting shoulder motion is more of a mass movement for flexion, ABD combo.  Remains limited for active elbow extension which limits reaching tasks.  Grip has weakened slightly since Botox injections but still around 4-5# of pressure.  We have not worked on grip except grossly since patient has spasticity.  Patient is now able to hold items better, able to hold and  manuever the grocery cart in the store better, he has been practicing at home in the car with placing arm on steering wheel, was able to grossly move a speaker and could wrap his right arm around it.  ADLs improved with bathing with use of right arm to wash left side, able to  reach and scratch his left arm now, able to pull pant strings and tie when dressing.  He still has difficulty with golf club grip, unable to hold cell phone, difficulty with weight bearing in standing due to lack of full wrist extension.  We will plan to complete 5-6 more sessions and may discharge with a home program unless additional progress is noted with Botox injections.     Pt will benefit from skilled therapeutic intervention in order to improve on the following deficits (Retired) (p) Decreased coordination;Decreased range of motion;Impaired flexibility;Impaired tone;Impaired UE functional use;Pain;Decreased strength   Rehab Potential (p) Good   Clinical Impairments Affecting Rehab Potential (p) spasticity of the right side especially at the elbow for flexion   OT Frequency (p) 2x / week   OT Duration (p) 8 weeks   OT Treatment/Interventions (p) Self-care/ADL training;Therapeutic exercise;Patient/family education;Neuromuscular education;Manual Therapy;Therapeutic exercises;Electrical Stimulation;Passive range of motion   OT Home Exercise Plan (p) Patient has established HEP from previous sessions and will continue to add to plan   Consulted and Agree with Plan of Care (p) Patient        Problem List Patient Active Problem List   Diagnosis Date Noted  . 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 Shoulder subluxation, right 04/23/2014  . Spastic hemiplegia affecting dominant side 02/18/2014  . HTN (hypertension) 01/17/2014  . Dyslipidemia 01/17/2014  . CVA (cerebral infarction) 12/27/2013   Amy T Tomasita Morrow, OTR/L, CLT Lovett,Amy 01/24/2015, 10:58 AM  [place] [address] [place], [place], [address] Phone: [phone]   Fax:  [phone]

## 2015-01-25 ENCOUNTER — Other Ambulatory Visit: Payer: Self-pay | Admitting: Family Medicine

## 2015-01-28 ENCOUNTER — Ambulatory Visit: Payer: BLUE CROSS/BLUE SHIELD | Admitting: Occupational Therapy

## 2015-01-28 DIAGNOSIS — G8191 Hemiplegia, unspecified affecting right dominant side: Secondary | ICD-10-CM

## 2015-01-28 DIAGNOSIS — IMO0002 Reserved for concepts with insufficient information to code with codable children: Secondary | ICD-10-CM

## 2015-01-28 DIAGNOSIS — M6281 Muscle weakness (generalized): Secondary | ICD-10-CM

## 2015-01-29 NOTE — Therapy (Signed)
[place] MAIN [facility] SERVICES [address] [place], [place], [address] Phone: [phone]   Fax:  [phone]  Occupational Therapy Treatment  Patient Details  Name: Makael Stein MRN: [id number] Date of Birth: Mar 29, 1956 Referring Provider:  Meredith Staggers, MD  Encounter Date: 01/28/2015      OT End of Session - 01/28/15 1530    Visit Number 93   Number of Visits 98   Date for OT Re-Evaluation 02/07/15   OT Start Time 1300   OT Stop Time 1349   OT Time Calculation (min) 49 min   Equipment Utilized During Treatment blue foam grip for tools.   Activity Tolerance Patient tolerated treatment well;No increased pain   Behavior During Therapy [facility] for tasks assessed/performed      Past Medical History  Diagnosis Date  . Stroke     Past Surgical History  Procedure Laterality Date  . Knee arthroscopy Right     There were no vitals filed for this visit.  Visit Diagnosis:  Hemiplegia affecting right dominant side  Lack of coordination due to stroke  Muscle weakness      Subjective Assessment - 01/28/15 1528    Subjective  Patient reports he had a busy weekend, celebrated his wife's birthday with his family.  He still worked on exercises and did tasks in the yard and around the home.  Reports he went to the doctor last week, may do another round of Botox for the forearm and hand in about 4 weeks.     Pertinent History Botox injections in right UE on 12-17-2014   Patient Stated Goals Patient wants to be independent with all self care and IADL tasks.   Currently in Pain? No/denies   Pain Score 0-No pain                      OT Treatments/Exercises (OP) - 01/28/15 1401    ADLs   Home Maintenance Patient seen for grip of right hand on rake and broom, patient has difficulty with maintaining grip, utilized blue foam, 2 pieces layered on the tools to increase surface grip, patient to take home and try.     Neurological  Re-education Exercises   Other Exercises 1 Patient seen for RUE ROM and facilitation of movement to utilize in ADL tasks as an assist at this point. Supine AAROM for shoulder flexion for place and hold for 10 secs with therapist assist to guide arm and assist with placement. Cues for patient to control the arm with decent from flexion. Shoulder ABD/ADD, external rotation, forearm supination, elbow flexion/extension. ADD to reach across body to opposite shoulder and opposite hip with cues and facilitation at the elbow   Other Exercises 2 Patient seen for gross grasp and release of objects varying in sizes and shapes, requires facilitation from therapist for release.  Unable to maintain grip on items greater than 1/2 pound.   Other Weight-Bearing Exercises 1 weight bearing to right UE with outstretched hand with use of dycem to keep hand in a stable position, then instructed to weight shift to right side with left hand reaching across body to help facilitate.Wrist stretches by therapist to improve ROM in the wrist and hand on the right.                OT Education - 01/28/15 1530    Education provided Yes   Education Details HEP, use of blue foam for grip on rake  Person(s) Educated Patient   Methods Explanation;Demonstration;Verbal cues   Comprehension Verbalized understanding;Returned demonstration;Verbal cues required             OT Long Term Goals - 12/13/14 1423    OT LONG TERM GOAL #1   Title Patient will improve R UE function to be able to complete bilateral ADL   Baseline Patient able to place both hands on large trash can and roll to the street now.   Time 8   Period Weeks   Status Partially Met   OT LONG TERM GOAL #2   Title Patient will be able to utilize RUE as an assist with washing the hood of his car    Time 8   Period Weeks   Status Achieved   OT LONG TERM GOAL #3   Title Patient will demo right shoulder ABD sufficient to apply deodorant independently   Time  8   Period Weeks   Status Achieved   OT LONG TERM GOAL #4   Title will improve supination of right hand by 20 degrees to help stabilize hand on rake for yardwork   Baseline Patient is now having to place hand on rake in a pronated position.   Time 8   Period Weeks   Status Partially Met   OT LONG TERM GOAL #5   Title Patient will improve right grip sufficient to stabilize garden tool in his hand to dig dirt for flowers    Baseline able to hold but not consistent yet with use   Time 8   Period Weeks   Status Partially Met   OT LONG TERM GOAL #6   Title Patient will complete folding of laundry using right hand as an assist during task    Baseline using right hand to help stabilize items but still lacks wrist extension of performing in standing.   Time 8   Period Weeks   Status Partially Met   OT LONG TERM GOAL #7   Title Patient will demonstrate the ability to hold a washcloth consistently and wash his lower legs and feet with modified independence.    Time 8   Period Weeks   Status New   OT LONG TERM GOAL #8   Title Patient will demonstrate secure grip on golf club to be able to putt around in his yard.   Time 8   Period Weeks   Status New               Plan - 01/28/15 1531    Clinical Impression Statement Continuing to focus on adjusting grip for tool use for yard work and home maintenance, using built up foam for increase grip surface.  Patient to try at home and report back next session.  Still showing gains in right UE for daily tasks.  Will work towards finalizing HEP in the next week before discharge.   Pt will benefit from skilled therapeutic intervention in order to improve on the following deficits (Retired) Decreased coordination;Decreased range of motion;Impaired flexibility;Impaired tone;Impaired UE functional use;Pain;Decreased strength   Rehab Potential Good   Clinical Impairments Affecting Rehab Potential spasticity of the right side especially at the elbow  for flexion   OT Frequency 2x / week   OT Duration 8 weeks   OT Treatment/Interventions Self-care/ADL training;Therapeutic exercise;Patient/family education;Neuromuscular education;Manual Therapy;Therapeutic exercises;Electrical Stimulation;Passive range of motion   OT Home Exercise Plan Patient has established HEP from previous sessions and will continue to add to plan   Consulted and Agree  with Plan of Care Patient        Problem List Patient Active Problem List   Diagnosis Date Noted  . 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 Shoulder subluxation, right 04/23/2014  . Spastic hemiplegia affecting dominant side 02/18/2014  . HTN (hypertension) 01/17/2014  . Dyslipidemia 01/17/2014  . CVA (cerebral infarction) 12/27/2013   Gizel Riedlinger T Tomasita Morrow, OTR/L, CLT Anhad Sheeley 01/29/2015, 2:08 PM  [place] MAIN [facility] SERVICES [address] [place], [place], [address] Phone: [phone]   Fax:  [phone]

## 2015-01-30 ENCOUNTER — Ambulatory Visit: Payer: Self-pay | Admitting: Family Medicine

## 2015-01-30 ENCOUNTER — Ambulatory Visit: Payer: BLUE CROSS/BLUE SHIELD | Admitting: Occupational Therapy

## 2015-01-31 ENCOUNTER — Other Ambulatory Visit: Payer: Self-pay | Admitting: Family Medicine

## 2015-01-31 NOTE — Telephone Encounter (Signed)
Pt would like a refill on Chlorthalidone to be sent to [facility]. Pt has no insurance at this time and currently working to gain some. Hopefully it will be next week.

## 2015-01-31 NOTE — Telephone Encounter (Signed)
Refill request was sent to Dr. Edwena Felty for approval and submission.  Chlorthalidone , 1 (one) Tablet Tablet daily, #90, 90 days starting 07/22/2014, Ref. x1. Active. Associated Diagnosis:Hypertension goal BP (blood pressure) <14090 (401.9  I10) Recorded 07/29/2014 02:11 PM by Edwena Felty, MD, Office Visit.

## 2015-02-03 ENCOUNTER — Ambulatory Visit: Payer: Self-pay | Admitting: Family Medicine

## 2015-02-04 ENCOUNTER — Ambulatory Visit: Payer: BLUE CROSS/BLUE SHIELD | Admitting: Occupational Therapy

## 2015-02-06 ENCOUNTER — Encounter: Payer: BLUE CROSS/BLUE SHIELD | Admitting: Occupational Therapy

## 2015-02-11 ENCOUNTER — Encounter: Payer: BLUE CROSS/BLUE SHIELD | Admitting: Occupational Therapy

## 2015-02-13 ENCOUNTER — Encounter: Payer: BLUE CROSS/BLUE SHIELD | Admitting: Occupational Therapy

## 2015-02-13 ENCOUNTER — Other Ambulatory Visit: Payer: Self-pay | Admitting: Family Medicine

## 2015-02-18 ENCOUNTER — Encounter: Payer: Self-pay | Admitting: Family Medicine

## 2015-02-18 ENCOUNTER — Ambulatory Visit (HOSPITAL_BASED_OUTPATIENT_CLINIC_OR_DEPARTMENT_OTHER): Payer: BLUE CROSS/BLUE SHIELD | Admitting: Physical Medicine & Rehabilitation

## 2015-02-18 ENCOUNTER — Ambulatory Visit (INDEPENDENT_AMBULATORY_CARE_PROVIDER_SITE_OTHER): Payer: BLUE CROSS/BLUE SHIELD | Admitting: Family Medicine

## 2015-02-18 ENCOUNTER — Encounter: Payer: Self-pay | Admitting: Physical Medicine & Rehabilitation

## 2015-02-18 ENCOUNTER — Ambulatory Visit: Payer: BLUE CROSS/BLUE SHIELD | Admitting: Physical Medicine & Rehabilitation

## 2015-02-18 VITALS — BP 158/94 | HR 95 | Temp 99.1°F | Resp 18 | Ht 71.0 in | Wt 221.0 lb

## 2015-02-18 VITALS — BP 132/89 | HR 84

## 2015-02-18 DIAGNOSIS — I63512 Cerebral infarction due to unspecified occlusion or stenosis of left middle cerebral artery: Secondary | ICD-10-CM

## 2015-02-18 DIAGNOSIS — I6523 Occlusion and stenosis of bilateral carotid arteries: Secondary | ICD-10-CM | POA: Diagnosis not present

## 2015-02-18 DIAGNOSIS — E785 Hyperlipidemia, unspecified: Secondary | ICD-10-CM | POA: Diagnosis not present

## 2015-02-18 DIAGNOSIS — R7309 Other abnormal glucose: Secondary | ICD-10-CM

## 2015-02-18 DIAGNOSIS — I10 Essential (primary) hypertension: Secondary | ICD-10-CM

## 2015-02-18 DIAGNOSIS — G8110 Spastic hemiplegia affecting unspecified side: Secondary | ICD-10-CM

## 2015-02-18 DIAGNOSIS — Z23 Encounter for immunization: Secondary | ICD-10-CM

## 2015-02-18 DIAGNOSIS — M24521 Contracture, right elbow: Secondary | ICD-10-CM

## 2015-02-18 DIAGNOSIS — R7303 Prediabetes: Secondary | ICD-10-CM

## 2015-02-18 HISTORY — DX: Cerebral infarction due to unspecified occlusion or stenosis of left middle cerebral artery: I63.512

## 2015-02-18 NOTE — Progress Notes (Signed)
Subjective:    Patient ID: [name], male    DOB: 11/07/1955, 59 y.o.   MRN: [id number]  HPI 7/26 Biceps200 FCR50 FCU25 FDS50 FDP50 PT 25 His right fingers feel looser but not his wrist or his elbow  Pt still doing outpt OT at [facility], Time for recertification Patient is independent with mobility, ambulates without an assistive device. He does drive  He has minimal functional use of his right upper extremity, does use it for a gross assistance at times  PCP visit today with flu shot and health maintenance Pain Inventory Average Pain 0 Pain Right Now 0 My pain is intermittent and tingling  In the last 24 hours, has pain interfered with the following? General activity 0 Relation with others 0 Enjoyment of life 0 What TIME of day is your pain at its worst? night Sleep (in general) Good  Pain is worse with: inactivity Pain improves with: therapy/exercise Relief from Meds: 7  Mobility how many minutes can you walk? 5 ability to climb steps?  yes do you drive?  yes  Function disabled: date disabled 2015 I need assistance with the following:  meal prep  Neuro/Psych No problems in this area  Prior Studies Any changes since last visit?  no  Physicians involved in your care Any changes since last visit?  no   Family History  Problem Relation Age of Onset  . Hypertension Mother    Social History   Social History  . Marital Status: Married    Spouse Name: N/A  . Number of Children: N/A  . Years of Education: N/A   Social History Main Topics  . Smoking status: Never Smoker   . Smokeless tobacco: None  . Alcohol Use: No  . Drug Use: No  . Sexual Activity: No   Other Topics Concern  . None   Social History Narrative   Past Surgical History  Procedure Laterality Date  . Knee arthroscopy Right    Past Medical History  Diagnosis Date  . Stroke   . Hyperlipidemia   . Hypertension    BP 132/89 mmHg  Pulse 84  SpO2 96%  Opioid Risk  Score:   Fall Risk Score:  `1  Depression screen PHQ 2/9  Depression screen [facility] 2/9 02/18/2015 02/18/2015 01/23/2015 12/17/2014 08/15/2014  Decreased Interest 0 0 0 0 0  Down, Depressed, Hopeless 0 0 0 0 0  PHQ - 2 Score 0 0 0 0 0  Altered sleeping - - - - 0  Tired, decreased energy - - - - 0  Change in appetite - - - - 0  Feeling bad or failure about yourself  - - - - 0  Trouble concentrating - - - - 0  Moving slowly or fidgety/restless - - - - 0  Suicidal thoughts - - - - 0  PHQ-9 Score - - - - 0     Review of Systems  All other systems reviewed and are negative.      Objective:   Physical Exam  MAS 3 in finger and wrist flexors Biceps MAS 3-4 Right elbow lacking 35 deg from full ext  Motor 2- Delt , bi, tri grip, Right lower extremity is 5/5 Left upper and left lower extremity 5/5    Assessment & Plan:  1.  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  Right Elbow contracture vs severe spasticity consider MC nerve block  Would recommend  holding off on occupational therapy until reinjection. Will Adjust our injection dosages   Discussed with patient agrees with plan

## 2015-02-18 NOTE — Patient Instructions (Addendum)
Would rec additional OT after repeat Botox in 1 mo

## 2015-02-18 NOTE — Progress Notes (Signed)
Name: [name]   MRN: [id number]    DOB: 10-Nov-1955   Date:02/18/2015       Progress Note  Subjective  Chief Complaint  Chief Complaint  Patient presents with  . Hypertension    patient is here for refills of his medication   . Hyperlipidemia    patient is here for refills of his medication     HPI  [name] is a 59 year old male who is here for routine follow up of chronic medical conditions. He suffered a cerebral infarction of the left lenticulostriate distribution, specifically left putamen, in 12/2013 and since then has had right sided hemiplegia initially affecting both right LE and UE but over time with rehab he has regained much of his LE strength. He still has weakness in his right UE worse in the distal component but now able to hold arm up above head for 2 mins duration. Working with specialist regarding this. Related risk factors include HTN, HLD, pre-diabetes. First diagnosed with hypertension several years ago. Current anti-hypertension medication regimen includes dietary modification, weight management and Lisinopril 20 mg, HCTZ 25 mg, Chlorthalidone 50 mg. Patient is following physician recommended management. Checking blood pressure outside of physician office. Results average systolic 135-145 and average diastolic 80-90. Associated symptoms do not include headache, dizziness, nausea, lower extremity swelling, shortness of breath, chest pain, numbness.  For his cholesterol he is taking simvastatin 20 mg a day with no side effects.   Active Ambulatory Problems    Diagnosis Date Noted  . 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 Hypertension goal BP (blood pressure) < 140/90 01/17/2014  . Hyperlipidemia LDL goal <70 01/17/2014  . Spastic hemiplegia affecting dominant side 02/18/2014  . Shoulder subluxation, right 04/23/2014  . 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 Need for immunization against influenza 02/18/2015   . Pre-diabetes 02/18/2015  . Atherosclerosis of both carotid arteries 02/18/2015  . 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 No Resolved Ambulatory Problems   Past Medical History  Diagnosis Date  . Stroke   . Hyperlipidemia   . Hypertension      Past Medical History  Diagnosis Date  . Stroke   . Hyperlipidemia   . Hypertension     Past Surgical History  Procedure Laterality Date  . Knee arthroscopy Right     Family History  Problem Relation Age of Onset  . Hypertension Mother     Social History   Social History  . Marital Status: Married    Spouse Name: N/A  . Number of Children: N/A  . Years of Education: N/A   Occupational History  . Not on file.   Social History Main Topics  . Smoking status: Never Smoker   . Smokeless tobacco: Not on file  . Alcohol Use: No  . Drug Use: No  . Sexual Activity: No   Other Topics Concern  . Not on file   Social History Narrative     Current outpatient prescriptions:  .  aspirin EC 325 MG tablet, TAKE 1 TABLET BY MOUTH EVERY DAY, Disp: 30 tablet, Rfl: 5 .  chlorthalidone (HYGROTON) 50 MG tablet, TAKE 1 TABLET EVERY DAY, Disp: 90 tablet, Rfl: 1 .  hydrochlorothiazide (HYDRODIURIL) 25 MG tablet, TAKE 1 TABLET BY MOUTH EVERY DAY, Disp: 30 tablet, Rfl: 0 .  lisinopril (PRINIVIL,ZESTRIL) 20 MG tablet, TAKE  1 TABLET EVERY DAY, Disp: 90 tablet, Rfl: 1 .  meloxicam (MOBIC) 15 MG tablet, TAKE 1 TABLET BY MOUTH DAILY TAKE WITH FOOD, Disp: 30 tablet, Rfl: 2 .  Multiple Vitamins-Minerals (ONE-A-DAY MENS HEALTH FORMULA) TABS, Take 1 tablet by mouth daily., Disp: , Rfl:  .  Omega-3 Fatty Acids (FISH OIL) 1000 MG CAPS, Take 1,000 mg by mouth daily., Disp: , Rfl:  .  Polyvinyl Alcohol-Povidone (MURINE TEARS FOR DRY EYES OP), Place 1 drop into both eyes 3 (three) times a week., Disp: , Rfl:  .  simvastatin (ZOCOR) 20 MG tablet, TAKE 1  TABLET EVERY DAY, Disp: 90 tablet, Rfl: 1  No Known Allergies   ROS  CONSTITUTIONAL: No significant weight changes, fever, chills, weakness or fatigue.  HEENT:  - Eyes: No visual changes.  - Ears: No auditory changes. No pain.  - Nose: No sneezing, congestion, runny nose. - Throat: No sore throat. No changes in swallowing. SKIN: No rash or itching.  CARDIOVASCULAR: No chest pain, chest pressure or chest discomfort. No palpitations or edema.  RESPIRATORY: No shortness of breath, cough or sputum.  NEUROLOGICAL: Yes paralysis of right UE. No headache, dizziness, syncope, ataxia, numbness or tingling in the extremities. No memory changes. No change in bowel or bladder control.  MUSCULOSKELETAL: Yes right shoulder joint pain. No muscle pain. ENDOCRINOLOGIC: No reports of sweating, cold or heat intolerance. No polyuria or polydipsia.   Objective  Filed Vitals:   02/18/15 1120 02/18/15 1211  BP: 170/90 158/94  Pulse: 95   Temp: 99.1 F (37.3 C)   TempSrc: Oral   Resp: 18   Height:  (1.803 m)   Weight: 221 lb (100.245 kg)   SpO2: 98%    Body mass index is 30.84 kg/(m^2).  Physical Exam  Constitutional: Patient appears well-developed and well-nourished. In no distress.  HEENT:  - Head: Normocephalic and atraumatic.  - Ears: Bilateral TMs gray, no erythema or effusion - Nose: Nasal mucosa moist - Mouth/Throat: Oropharynx is clear and moist. No tonsillar hypertrophy or erythema. No post nasal drainage.  - Eyes: Conjunctivae clear, EOM movements normal. PERRLA. No scleral icterus.  Neck: Normal range of motion. Neck supple. No JVD present. No thyromegaly present.  Cardiovascular: Normal rate, regular rhythm and normal heart sounds.  No murmur heard.  Pulmonary/Chest: Effort normal and breath sounds normal. No respiratory distress. Musculoskeletal: Normal range of motion bilateral LE, no joint effusions. LE strength 5/5 bilaterally. Left UE strength 5/5. Notable persistent  weakness in his right UE 3/5 strength worse in the distal component with hand grip being 2/5 in strength. Notable modest contracture of fingers of right hand. Good muscle tone. Sensation intact throughout.  Peripheral vascular: Bilateral LE no edema. Psychiatric: Patient has a normal mood and affect. Behavior is normal in office today. Judgment and thought content normal in office today.   Assessment & Plan   1. Hypertension goal BP (blood pressure) < 140/90 Sub optimal control. Instructed patient to bring home BP monitor with him at next visit. Will get blood work to assess for risk factors such as CKD or progression to DM II. If BP still elevated at next visit will increase Lisinopril to 40 mg.  - CBC with Differential/Platelet - Comprehensive metabolic panel - TSH - Hemoglobin A1c  2. Hyperlipidemia LDL goal <70 Lab work. Continue statin therapy.  - Lipid panel - TSH - Hemoglobin A1c  3. Pre-diabetes Lab work.  - Hemoglobin A1c  4. Cerebral infarction due to occlusion of  left middle cerebral artery Doing well 1 year post CVA with residual deficits of right UE still persistent. Optimize medical management with control of risk factors such as HTN, HLD, avoid cigarette smoking, continue ASA 325 mg a day.  5. Spastic hemiplegia affecting dominant side Stable. Continue working with specialist.  6. Atherosclerosis of both carotid arteries Optimize medical management with control of risk factors such as HTN, HLD, avoid cigarette smoking, continue ASA 325 mg a day.  7. Flexion contracture of right elbow Stable. Continue working with specialist.  8. Need for immunization against influenza  - Flu vaccine Quad 36+ mos IM

## 2015-03-12 LAB — COMPREHENSIVE METABOLIC PANEL
ALBUMIN: 4.6 g/dL (ref 3.6–4.8)
ALT: 25 IU/L (ref 0–44)
AST: 24 IU/L (ref 0–40)
Albumin/Globulin Ratio: 1.6 (ref 1.1–2.5)
Alkaline Phosphatase: 66 IU/L (ref 39–117)
BILIRUBIN TOTAL: 0.3 mg/dL (ref 0.0–1.2)
BUN / CREAT RATIO: 14 (ref 10–22)
BUN: 14 mg/dL (ref 8–27)
CALCIUM: 9.7 mg/dL (ref 8.6–10.2)
CHLORIDE: 94 mmol/L — AB (ref 97–106)
CO2: 28 mmol/L (ref 18–29)
Creatinine, Ser: 0.98 mg/dL (ref 0.76–1.27)
GFR, EST AFRICAN AMERICAN: 96 mL/min/{1.73_m2} (ref >59)
GFR, EST NON AFRICAN AMERICAN: 83 mL/min/{1.73_m2} (ref >59)
Globulin, Total: 2.8 g/dL (ref 1.5–4.5)
Glucose: 101 mg/dL — ABNORMAL HIGH (ref 65–99)
Potassium: 3.6 mmol/L (ref 3.5–5.2)
Sodium: 137 mmol/L (ref 136–144)
TOTAL PROTEIN: 7.4 g/dL (ref 6.0–8.5)

## 2015-03-12 LAB — CBC WITH DIFFERENTIAL/PLATELET
BASOS: 1 %
Basophils Absolute: 0 10*3/uL (ref 0.0–0.2)
EOS (ABSOLUTE): 0.1 10*3/uL (ref 0.0–0.4)
Eos: 2 %
Hematocrit: 42.5 % (ref 37.5–51.0)
Hemoglobin: 13.7 g/dL (ref 12.6–17.7)
Immature Grans (Abs): 0 10*3/uL (ref 0.0–0.1)
Immature Granulocytes: 0 %
Lymphocytes Absolute: 1.6 10*3/uL (ref 0.7–3.1)
Lymphs: 38 %
MCH: 27.7 pg (ref 26.6–33.0)
MCHC: 32.2 g/dL (ref 31.5–35.7)
MCV: 86 fL (ref 79–97)
MONOCYTES: 7 %
MONOS ABS: 0.3 10*3/uL (ref 0.1–0.9)
NEUTROS ABS: 2.2 10*3/uL (ref 1.4–7.0)
NEUTROS PCT: 52 %
PLATELETS: 290 10*3/uL (ref 150–379)
RBC: 4.94 x10E6/uL (ref 4.14–5.80)
RDW: 14.2 % (ref 12.3–15.4)
WBC: 4.2 10*3/uL (ref 3.4–10.8)

## 2015-03-12 LAB — LIPID PANEL
CHOL/HDL RATIO: 3.4 ratio (ref 0.0–5.0)
Cholesterol, Total: 198 mg/dL (ref 100–199)
HDL: 58 mg/dL (ref >39)
LDL CALC: 130 mg/dL — AB (ref 0–99)
Triglycerides: 48 mg/dL (ref 0–149)
VLDL CHOLESTEROL CAL: 10 mg/dL (ref 5–40)

## 2015-03-12 LAB — HEMOGLOBIN A1C
Est. average glucose Bld gHb Est-mCnc: 131 mg/dL
HEMOGLOBIN A1C: 6.2 % — AB (ref 4.8–5.6)

## 2015-03-12 LAB — TSH: TSH: 4.33 u[IU]/mL (ref 0.450–4.500)

## 2015-03-17 ENCOUNTER — Ambulatory Visit (HOSPITAL_BASED_OUTPATIENT_CLINIC_OR_DEPARTMENT_OTHER): Payer: BLUE CROSS/BLUE SHIELD | Admitting: Physical Medicine & Rehabilitation

## 2015-03-17 ENCOUNTER — Encounter: Payer: Self-pay | Admitting: Physical Medicine & Rehabilitation

## 2015-03-17 ENCOUNTER — Encounter: Payer: BLUE CROSS/BLUE SHIELD | Attending: Physical Medicine & Rehabilitation

## 2015-03-17 VITALS — BP 159/101 | HR 77 | Resp 14

## 2015-03-17 DIAGNOSIS — G8110 Spastic hemiplegia affecting unspecified side: Secondary | ICD-10-CM

## 2015-03-17 DIAGNOSIS — I69351 Hemiplegia and hemiparesis following cerebral infarction affecting right dominant side: Secondary | ICD-10-CM | POA: Diagnosis present

## 2015-03-17 DIAGNOSIS — M25511 Pain in right shoulder: Secondary | ICD-10-CM | POA: Diagnosis not present

## 2015-03-17 DIAGNOSIS — Z7982 Long term (current) use of aspirin: Secondary | ICD-10-CM | POA: Diagnosis not present

## 2015-03-17 NOTE — Progress Notes (Signed)
   Subjective:    Patient ID: [name], male    DOB: 11/10/1955, 59 y.o.   MRN: [id number]  HPI    Review of Systems     Objective:   Physical Exam        Assessment & Plan:   Botox Injection for spasticity using needle EMG guidance  Dilution: 50 Units/ml Indication: Severe spasticity which interferes with ADL,mobility and/or  hygiene and is unresponsive to medication management and other conservative care Informed consent was obtained after describing risks and benefits of the procedure with the patient. This includes bleeding, bruising, infection, excessive weakness, or medication side effects. A REMS form is on file and signed. Needle: 50mm 25g needle electrode Number of units per muscle Brachioradialis 50 FCR50 FCU50 FDS50 FDP50 PT 50   All injections were done after obtaining appropriate EMG activity and after negative drawback for blood. The patient tolerated the procedure well. Post procedure instructions were given. A followup appointment was made.

## 2015-03-18 ENCOUNTER — Ambulatory Visit (INDEPENDENT_AMBULATORY_CARE_PROVIDER_SITE_OTHER): Payer: BLUE CROSS/BLUE SHIELD | Admitting: Family Medicine

## 2015-03-18 ENCOUNTER — Encounter: Payer: Self-pay | Admitting: Family Medicine

## 2015-03-18 VITALS — BP 146/82 | HR 89 | Temp 98.1°F | Resp 16 | Wt 216.8 lb

## 2015-03-18 DIAGNOSIS — I10 Essential (primary) hypertension: Secondary | ICD-10-CM | POA: Diagnosis not present

## 2015-03-18 MED ORDER — LISINOPRIL 40 MG PO TABS: 40.0000 mg | ORAL_TABLET | Freq: Every day | ORAL | Status: DC

## 2015-03-18 MED ORDER — ASPIRIN EC 325 MG PO TBEC: 325.0000 mg | DELAYED_RELEASE_TABLET | Freq: Every day | ORAL | Status: DC

## 2015-03-18 NOTE — Progress Notes (Signed)
Name: [name]   MRN: [id number]    DOB: 03/06/1956   Date:03/18/2015       Progress Note  Subjective  Chief Complaint  Chief Complaint  Patient presents with  . Hypertension    patient is here for his 4-week follow-up    HPI  [name] is a 59 year old male who is here for routine follow up of chronic medical conditions. He suffered a cerebral infarction of the left lenticulostriate distribution, specifically left putamen, in 12/2013 and since then has had right sided hemiplegia initially affecting both right LE and UE but over time with rehab he has regained much of his LE strength. He still has weakness in his right UE worse in the distal component but now able to hold arm up above head for 2 mins duration. Working with specialist regarding this. Related risk factors include HTN, HLD, pre-diabetes. First diagnosed with hypertension several years ago. Current anti-hypertension medication regimen includes dietary modification, weight management and Lisinopril 20 mg, HCTZ 25 mg, Chlorthalidone 50 mg. Patient is following physician recommended management. Checking blood pressure outside of physician office with home BP machine he has brought today to compare:  Home results:  123/81 126/83 146/90  Associated symptoms do not include headache, dizziness, nausea, lower extremity swelling, shortness of breath, chest pain, numbness. For his cholesterol he is taking simvastatin 20 mg a day with no side effects.   Past Medical History  Diagnosis Date  . Stroke (HCC)   . Hyperlipidemia   . Hypertension     Patient Active Problem List   Diagnosis Date Noted  . Need for immunization against influenza 02/18/2015  . Pre-diabetes 02/18/2015  . Atherosclerosis of both carotid arteries 02/18/2015  . 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 Shoulder subluxation, right 04/23/2014  . Spastic  hemiplegia affecting dominant side (HCC) 02/18/2014  . Hypertension goal BP (blood pressure) < 140/90 01/17/2014  . Hyperlipidemia LDL goal <70 01/17/2014    Social History  Substance Use Topics  . Smoking status: Never Smoker   . Smokeless tobacco: Not on file  . Alcohol Use: No     Current outpatient prescriptions:  .  aspirin EC 325 MG tablet, TAKE 1 TABLET BY MOUTH EVERY DAY, Disp: 30 tablet, Rfl: 5 .  chlorthalidone (HYGROTON) 50 MG tablet, TAKE 1 TABLET EVERY DAY, Disp: 90 tablet, Rfl: 1 .  hydrochlorothiazide (HYDRODIURIL) 25 MG tablet, TAKE 1 TABLET BY MOUTH EVERY DAY, Disp: 30 tablet, Rfl: 0 .  lisinopril (PRINIVIL,ZESTRIL) 20 MG tablet, TAKE 1 TABLET EVERY DAY, Disp: 90 tablet, Rfl: 1 .  meloxicam (MOBIC) 15 MG tablet, TAKE 1 TABLET BY MOUTH DAILY TAKE WITH FOOD, Disp: 30 tablet, Rfl: 2 .  Multiple Vitamins-Minerals (ONE-A-DAY MENS HEALTH FORMULA) TABS, Take 1 tablet by mouth daily., Disp: , Rfl:  .  Omega-3 Fatty Acids (FISH OIL) 1000 MG CAPS, Take 1,000 mg by mouth daily., Disp: , Rfl:  .  Polyvinyl Alcohol-Povidone (MURINE TEARS FOR DRY EYES OP), Place 1 drop into both eyes 3 (three) times a week., Disp: , Rfl:  .  simvastatin (ZOCOR) 20 MG tablet, TAKE 1 TABLET EVERY DAY, Disp: 90 tablet, Rfl: 1  Past Surgical History  Procedure Laterality Date  . Knee arthroscopy Right     Family History  Problem Relation Age of Onset  . Hypertension Mother     No Known Allergies   Review of  Systems  CONSTITUTIONAL: No significant weight changes, fever, chills, weakness or fatigue.  HEENT:  - Eyes: No visual changes.  - Ears: No auditory changes. No pain.  - Nose: No sneezing, congestion, runny nose. - Throat: No sore throat. No changes in swallowing. SKIN: No rash or itching.  CARDIOVASCULAR: No chest pain, chest pressure or chest discomfort. No palpitations or edema.  RESPIRATORY: No shortness of breath, cough or sputum.  NEUROLOGICAL: Yes paralysis of right UE.  No headache, dizziness, syncope, ataxia, numbness or tingling in the extremities. No memory changes. No change in bowel or bladder control.  MUSCULOSKELETAL: Yes right shoulder joint pain. No muscle pain. ENDOCRINOLOGIC: No reports of sweating, cold or heat intolerance. No polyuria or polydipsia.     Objective  BP 146/82 mmHg  Pulse 89  Temp(Src) 98.1 F (36.7 C) (Oral)  Resp 16  Wt 216 lb 12.8 oz (98.34 kg)  SpO2 98% Body mass index is 30.25 kg/(m^2).  Physical Exam  Constitutional: Patient appears well-developed and well-nourished. In no distress.  Cardiovascular: Normal rate, regular rhythm and normal heart sounds. No murmur heard.  Pulmonary/Chest: Effort normal and breath sounds normal. No respiratory distress. Musculoskeletal: Normal range of motion bilateral LE, no joint effusions. LE strength 5/5 bilaterally. Left UE strength 5/5. Notable persistent weakness in his right UE 3/5 strength worse in the distal component with hand grip being 2/5 in strength. Notable modest contracture of fingers of right hand. Good muscle tone. Sensation intact throughout.  Peripheral vascular: Bilateral LE no edema. Psychiatric: Patient has a normal mood and affect. Behavior is normal in office today. Judgment and thought content normal in office today.   Recent Results (from the past 2160 hour(s))  CBC with Differential/Platelet     Status: None   Collection Time: 03/11/15  2:16 PM  Result Value Ref Range   WBC 4.2 3.4 - 10.8 x10E3/uL   RBC 4.94 4.14 - 5.80 x10E6/uL   Hemoglobin 13.7 12.6 - 17.7 g/dL   Hematocrit 16.1 09.6 - 51.0 %   MCV 86 79 - 97 fL   MCH 27.7 26.6 - 33.0 pg   MCHC 32.2 31.5 - 35.7 g/dL   RDW 04.5 40.9 - 81.1 %   Platelets 290 150 - 379 x10E3/uL   Neutrophils 52 %   Lymphs 38 %   Monocytes 7 %   Eos 2 %   Basos 1 %   Neutrophils Absolute 2.2 1.4 - 7.0 x10E3/uL   Lymphocytes Absolute 1.6 0.7 - 3.1 x10E3/uL   Monocytes Absolute 0.3 0.1 - 0.9 x10E3/uL   EOS  (ABSOLUTE) 0.1 0.0 - 0.4 x10E3/uL   Basophils Absolute 0.0 0.0 - 0.2 x10E3/uL   Immature Granulocytes 0 %   Immature Grans (Abs) 0.0 0.0 - 0.1 x10E3/uL  Comprehensive metabolic panel     Status: Abnormal   Collection Time: 03/11/15  2:16 PM  Result Value Ref Range   Glucose 101 (H) 65 - 99 mg/dL   BUN 14 8 - 27 mg/dL   Creatinine, Ser 9.14 0.76 - 1.27 mg/dL   GFR calc non Af Amer 83 >59 mL/min/1.73   GFR calc Af Amer 96 >59 mL/min/1.73   BUN/Creatinine Ratio 14 10 - 22   Sodium 137 136 - 144 mmol/L    Comment:               **Please note reference interval change**   Potassium 3.6 3.5 - 5.2 mmol/L    Comment:               **  Please note reference interval change**   Chloride 94 (L) 97 - 106 mmol/L    Comment:               **Please note reference interval change**   CO2 28 18 - 29 mmol/L   Calcium 9.7 8.6 - 10.2 mg/dL   Total Protein 7.4 6.0 - 8.5 g/dL   Albumin 4.6 3.6 - 4.8 g/dL   Globulin, Total 2.8 1.5 - 4.5 g/dL   Albumin/Globulin Ratio 1.6 1.1 - 2.5   Bilirubin Total 0.3 0.0 - 1.2 mg/dL   Alkaline Phosphatase 66 39 - 117 IU/L   AST 24 0 - 40 IU/L   ALT 25 0 - 44 IU/L  Lipid panel     Status: Abnormal   Collection Time: 03/11/15  2:16 PM  Result Value Ref Range   Cholesterol, Total 198 100 - 199 mg/dL   Triglycerides 48 0 - 149 mg/dL   HDL 58 >95 mg/dL    Comment: According to ATP-III Guidelines, HDL-C >59 mg/dL is considered a negative risk factor for CHD.    VLDL Cholesterol Cal 10 5 - 40 mg/dL   LDL Calculated 621 (H) 0 - 99 mg/dL   Chol/HDL Ratio 3.4 0.0 - 5.0 ratio units    Comment:                                   T. Chol/HDL Ratio                                             Men  Women                               1/2 Avg.Risk  3.4    3.3                                   Avg.Risk  5.0    4.4                                2X Avg.Risk  9.6    7.1                                3X Avg.Risk 23.4   11.0   TSH     Status: None   Collection Time: 03/11/15   2:16 PM  Result Value Ref Range   TSH 4.330 0.450 - 4.500 uIU/mL  Hemoglobin A1c     Status: Abnormal   Collection Time: 03/11/15  2:16 PM  Result Value Ref Range   Hgb A1c MFr Bld 6.2 (H) 4.8 - 5.6 %    Comment:          Pre-diabetes: 5.7 - 6.4          Diabetes: >6.4          Glycemic control for adults with diabetes: <7.0    Est. average glucose Bld gHb Est-mCnc 131 mg/dL     Assessment & Plan  1. Hypertension goal BP (blood pressure) < 140/90 Increase Lisinopril from 20 mg  to 40 mg as I feel that his BP can be labile. If he has any adverse symptoms or SE he is to return to office sooner than our agreed upon f/u in 4 months as he will be traveling over the holidays.   - aspirin EC 325 MG tablet; Take 1 tablet (325 mg total) by mouth daily.  Dispense: 90 tablet; Refill: 3 - lisinopril (PRINIVIL,ZESTRIL) 40 MG tablet; Take 1 tablet (40 mg total) by mouth daily.  Dispense: 90 tablet; Refill: 3

## 2015-04-05 ENCOUNTER — Other Ambulatory Visit: Payer: Self-pay | Admitting: Family Medicine

## 2015-04-29 ENCOUNTER — Ambulatory Visit (HOSPITAL_BASED_OUTPATIENT_CLINIC_OR_DEPARTMENT_OTHER): Payer: BLUE CROSS/BLUE SHIELD | Admitting: Physical Medicine & Rehabilitation

## 2015-04-29 ENCOUNTER — Encounter: Payer: BLUE CROSS/BLUE SHIELD | Attending: Physical Medicine & Rehabilitation

## 2015-04-29 ENCOUNTER — Encounter: Payer: Self-pay | Admitting: Physical Medicine & Rehabilitation

## 2015-04-29 VITALS — BP 143/81 | HR 92 | Resp 14

## 2015-04-29 DIAGNOSIS — M25511 Pain in right shoulder: Secondary | ICD-10-CM | POA: Diagnosis not present

## 2015-04-29 DIAGNOSIS — I63512 Cerebral infarction due to unspecified occlusion or stenosis of left middle cerebral artery: Secondary | ICD-10-CM

## 2015-04-29 DIAGNOSIS — Z7982 Long term (current) use of aspirin: Secondary | ICD-10-CM | POA: Diagnosis not present

## 2015-04-29 DIAGNOSIS — I69351 Hemiplegia and hemiparesis following cerebral infarction affecting right dominant side: Secondary | ICD-10-CM | POA: Insufficient documentation

## 2015-04-29 DIAGNOSIS — G8110 Spastic hemiplegia affecting unspecified side: Secondary | ICD-10-CM

## 2015-04-29 NOTE — Patient Instructions (Signed)
botox next visit 

## 2015-04-29 NOTE — Progress Notes (Signed)
Subjective:    Patient ID: [name], male    DOB: 1955-11-08, 59 y.o.   MRN: [id number]  HPI   Last Botox injection performed 6 weeks ago Brachioradialis 50 FCR50 FCU50 FDS50 FDP50 PT 50  Patient took a trip to [place] driving most of the way with his wife. No issues with that. Has been able to use right upper extremity as gross assist Pain Inventory Average Pain 2 Pain Right Now 1 My pain is aching  In the last 24 hours, has pain interfered with the following? General activity 0 Relation with others 0 Enjoyment of life 0 What TIME of day is your pain at its worst? night Sleep (in general) Good  Pain is worse with: some activites Pain improves with: therapy/exercise and medication Relief from Meds: 5  Mobility walk with assistance use a cane how many minutes can you walk? 5 ability to climb steps?  yes do you drive?  yes Do you have any goals in this area?  yes  Function disabled: date disabled . I need assistance with the following:  meal prep Do you have any goals in this area?  yes  Neuro/Psych No problems in this area  Prior Studies Any changes since last visit?  no  Physicians involved in your care Any changes since last visit?  no   Family History  Problem Relation Age of Onset  . Hypertension Mother    Social History   Social History  . Marital Status: Married    Spouse Name: N/A  . Number of Children: N/A  . Years of Education: N/A   Social History Main Topics  . Smoking status: Never Smoker   . Smokeless tobacco: None  . Alcohol Use: No  . Drug Use: No  . Sexual Activity: No   Other Topics Concern  . None   Social History Narrative   Past Surgical History  Procedure Laterality Date  . Knee arthroscopy Right    Past Medical History  Diagnosis Date  . Stroke (HCC)   . Hyperlipidemia   . Hypertension    BP 143/81 mmHg  Pulse 92  Resp 14  SpO2 100%  Opioid Risk Score:   Fall Risk Score:  `1  Depression  screen PHQ 2/9  Depression screen [facility] 2/9 02/18/2015 02/18/2015 01/23/2015 12/17/2014 08/15/2014  Decreased Interest 0 0 0 0 0  Down, Depressed, Hopeless 0 0 0 0 0  PHQ - 2 Score 0 0 0 0 0  Altered sleeping - - - - 0  Tired, decreased energy - - - - 0  Change in appetite - - - - 0  Feeling bad or failure about yourself  - - - - 0  Trouble concentrating - - - - 0  Moving slowly or fidgety/restless - - - - 0  Suicidal thoughts - - - - 0  PHQ-9 Score - - - - 0     Review of Systems  All other systems reviewed and are negative.      Objective:   Physical Exam  Constitutional: He is oriented to person, place, and time. He appears well-developed and well-nourished.  HENT:  Head: Normocephalic and atraumatic.  Eyes: Conjunctivae and EOM are normal. Pupils are equal, round, and reactive to light.  Musculoskeletal: Normal range of motion.  Neurological: He is alert and oriented to person, place, and time.  Psychiatric: He has a normal mood and affect.  Nursing note and vitals reviewed.  Motor strength is 3 minus  at the right deltoid, 2 minus at the biceps 3 minus at the finger flexors and 2 minus at the finger and wrist extensors. 5/5 in the left upper extremity 5/5 left lower extremity 4/5 right lower extremity Ashworth grade 3 at the right elbow Ashworth grade 2 at the pronator teres Ashworth grade 1 at the finger and wrist flexors Gen. No acute distress     Assessment & Plan:  1. Improvement after botulinum toxin injection right upper extremity for post stroke spastic hemiplegia Hand and finger flexor spasticity improved still has severe elbow spasticity with incomplete extension  Planned for next treatment Brachioradialis 50 FCR50 FCU50 FDS50 FDP50 PT 50 Biceps/brachialis 100 units

## 2015-06-03 ENCOUNTER — Ambulatory Visit (HOSPITAL_BASED_OUTPATIENT_CLINIC_OR_DEPARTMENT_OTHER): Payer: BLUE CROSS/BLUE SHIELD | Admitting: Physical Medicine & Rehabilitation

## 2015-06-03 ENCOUNTER — Encounter: Payer: BLUE CROSS/BLUE SHIELD | Attending: Physical Medicine & Rehabilitation

## 2015-06-03 ENCOUNTER — Encounter: Payer: Self-pay | Admitting: Physical Medicine & Rehabilitation

## 2015-06-03 VITALS — BP 136/81 | HR 89

## 2015-06-03 DIAGNOSIS — Z7982 Long term (current) use of aspirin: Secondary | ICD-10-CM | POA: Insufficient documentation

## 2015-06-03 DIAGNOSIS — I69351 Hemiplegia and hemiparesis following cerebral infarction affecting right dominant side: Secondary | ICD-10-CM | POA: Insufficient documentation

## 2015-06-03 DIAGNOSIS — G8110 Spastic hemiplegia affecting unspecified side: Secondary | ICD-10-CM | POA: Diagnosis not present

## 2015-06-03 DIAGNOSIS — M25511 Pain in right shoulder: Secondary | ICD-10-CM | POA: Insufficient documentation

## 2015-06-03 NOTE — Progress Notes (Signed)
   Subjective:    Patient ID: [name], male    DOB: 02/19/1956, 60 y.o.   MRN: [id number]  HPI    Review of Systems     Objective:   Physical Exam        Assessment & Plan:  Botox Injection Right upper limb for spasticity using needle EMG guidance  Dilution: 50 Units/ml, Total of 400 units injected, no wastage Indication: Severe spasticity which interferes with ADL,mobility and/or  hygiene and is unresponsive to medication management and other conservative care Informed consent was obtained after describing risks and benefits of the procedure with the patient. This includes bleeding, bruising, infection, excessive weakness, or medication side effects. A REMS form is on file and signed. Needle: 50mm 26g needle electrode Number of units per muscle Right upper extremity Biceps100 FCR50 FCU50 FDS50 FDP50 FPL0 PT50 Brachioradialis 50 All injections were done after obtaining appropriate EMG activity and after negative drawback for blood. The patient tolerated the procedure well. Post procedure instructions were given. A followup appointment was made.

## 2015-06-03 NOTE — Patient Instructions (Addendum)
Next visit is follow up no injection

## 2015-07-15 ENCOUNTER — Encounter: Payer: Self-pay | Admitting: Physical Medicine & Rehabilitation

## 2015-07-15 ENCOUNTER — Ambulatory Visit (HOSPITAL_BASED_OUTPATIENT_CLINIC_OR_DEPARTMENT_OTHER): Payer: BLUE CROSS/BLUE SHIELD | Admitting: Physical Medicine & Rehabilitation

## 2015-07-15 ENCOUNTER — Encounter: Payer: BLUE CROSS/BLUE SHIELD | Attending: Physical Medicine & Rehabilitation

## 2015-07-15 VITALS — BP 143/82 | HR 86

## 2015-07-15 DIAGNOSIS — Z7982 Long term (current) use of aspirin: Secondary | ICD-10-CM | POA: Insufficient documentation

## 2015-07-15 DIAGNOSIS — M25511 Pain in right shoulder: Secondary | ICD-10-CM | POA: Insufficient documentation

## 2015-07-15 DIAGNOSIS — I69351 Hemiplegia and hemiparesis following cerebral infarction affecting right dominant side: Secondary | ICD-10-CM | POA: Insufficient documentation

## 2015-07-15 DIAGNOSIS — G8110 Spastic hemiplegia affecting unspecified side: Secondary | ICD-10-CM | POA: Diagnosis not present

## 2015-07-15 NOTE — Progress Notes (Signed)
Subjective:    Patient ID: [name], male    DOB: 04-15-1956, 60 y.o.   MRN: [id number]  HPI 06/03/15 Right upper extremity Biceps100 FCR50 FCU50 FDS50 FDP50 FPL0 PT50 Brachioradialis 50  Moving RUE more since injection Lifting overhead more Fingers more relaxed No complications from injection   Pain Inventory Average Pain 1 Pain Right Now 1 My pain is tingling and aching  In the last 24 hours, has pain interfered with the following? General activity 0 Relation with others 0 Enjoyment of life 0 What TIME of day is your pain at its worst? night Sleep (in general) Good  Pain is worse with: inactivity Pain improves with: therapy/exercise and TENS Relief from Meds: 1  Mobility walk without assistance how many minutes can you walk? 5 ability to climb steps?  yes do you drive?  yes  Function disabled: date disabled 2015 I need assistance with the following:  meal prep  Neuro/Psych No problems in this area  Prior Studies Any changes since last visit?  no  Physicians involved in your care Any changes since last visit?  no   Family History  Problem Relation Age of Onset  . Hypertension Mother    Social History   Social History  . Marital Status: Married    Spouse Name: N/A  . Number of Children: N/A  . Years of Education: N/A   Social History Main Topics  . Smoking status: Never Smoker   . Smokeless tobacco: None  . Alcohol Use: No  . Drug Use: No  . Sexual Activity: No   Other Topics Concern  . None   Social History Narrative   Past Surgical History  Procedure Laterality Date  . Knee arthroscopy Right    Past Medical History  Diagnosis Date  . Stroke (HCC)   . Hyperlipidemia   . Hypertension    BP 143/82 mmHg  Pulse 86  SpO2 100%  Opioid Risk Score:   Fall Risk Score:  `1  Depression screen PHQ 2/9  Depression screen [facility] 2/9 02/18/2015 02/18/2015 01/23/2015 12/17/2014 08/15/2014  Decreased Interest 0 0 0 0 0  Down,  Depressed, Hopeless 0 0 0 0 0  PHQ - 2 Score 0 0 0 0 0  Altered sleeping - - - - 0  Tired, decreased energy - - - - 0  Change in appetite - - - - 0  Feeling bad or failure about yourself  - - - - 0  Trouble concentrating - - - - 0  Moving slowly or fidgety/restless - - - - 0  Suicidal thoughts - - - - 0  PHQ-9 Score - - - - 0     Review of Systems  All other systems reviewed and are negative.      Objective:   Physical Exam  Constitutional: He is oriented to person, place, and time. He appears well-developed and well-nourished.  HENT:  Head: Normocephalic and atraumatic.  Eyes: Conjunctivae are normal. Pupils are equal, round, and reactive to light.  Neck: Normal range of motion.  Musculoskeletal:       Right shoulder: He exhibits decreased range of motion.  Neurological: He is alert and oriented to person, place, and time.  Psychiatric: He has a normal mood and affect.  Nursing note and vitals reviewed.  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 He's had good relief with Botox injections. Have been adjusting doses. He has good relaxation and finger wrist flexors. He has had maximum dose Botox at 400 units. Still has residualRight elbow flexor spasticity. Would recommend the following dosage changes and clinical response observed today.  Biceps100 Brachialis 100 FCR25 FCU25 FDS25 FDP25 PT50 Brachioradialis 50

## 2015-07-15 NOTE — Patient Instructions (Signed)
Repeat Botox next visit but will increase the biceps dose and reduce the finger flexor and wrist flexor dose

## 2015-07-21 ENCOUNTER — Encounter: Payer: Self-pay | Admitting: Family Medicine

## 2015-07-21 ENCOUNTER — Ambulatory Visit (INDEPENDENT_AMBULATORY_CARE_PROVIDER_SITE_OTHER): Payer: BLUE CROSS/BLUE SHIELD | Admitting: Family Medicine

## 2015-07-21 VITALS — BP 138/82 | HR 96 | Temp 97.8°F | Resp 16 | Ht 71.0 in | Wt 220.0 lb

## 2015-07-21 DIAGNOSIS — E785 Hyperlipidemia, unspecified: Secondary | ICD-10-CM

## 2015-07-21 DIAGNOSIS — H9202 Otalgia, left ear: Secondary | ICD-10-CM | POA: Diagnosis not present

## 2015-07-21 DIAGNOSIS — M24521 Contracture, right elbow: Secondary | ICD-10-CM

## 2015-07-21 DIAGNOSIS — Z23 Encounter for immunization: Secondary | ICD-10-CM | POA: Diagnosis not present

## 2015-07-21 DIAGNOSIS — G8110 Spastic hemiplegia affecting unspecified side: Secondary | ICD-10-CM | POA: Diagnosis not present

## 2015-07-21 DIAGNOSIS — I10 Essential (primary) hypertension: Secondary | ICD-10-CM | POA: Diagnosis not present

## 2015-07-21 MED ORDER — SIMVASTATIN 20 MG PO TABS: 20.0000 mg | ORAL_TABLET | Freq: Every day | ORAL | Status: DC

## 2015-07-21 MED ORDER — CHLORTHALIDONE 50 MG PO TABS: 50.0000 mg | ORAL_TABLET | Freq: Every day | ORAL | Status: DC

## 2015-07-21 NOTE — Progress Notes (Signed)
Name: [name]   MRN: [id number]    DOB: 1955/07/16   Date:07/21/2015       Progress Note  Subjective  Chief Complaint  Chief Complaint  Patient presents with  . Medication Refill  . Hypertension  . Hyperlipidemia  . Ear Pain    left ear has some pain on and off    HPI  [name] is a 60 year old male who is here for routine follow up of chronic medical conditions. He suffered a cerebral infarction of the left lenticulostriate distribution, specifically left putamen, in 12/2013 and since then has had right sided hemiplegia initially affecting both right LE and UE but over time with rehab he has regained much of his LE strength. He still has weakness in his right UE worse in the distal component but now able to hold arm up above head for longer durations with PT. Working with specialist regarding this. Related risk factors include HTN, HLD, pre-diabetes. First diagnosed with hypertension several years ago. Current anti-hypertension medication regimen includes dietary modification, weight management and Lisinopril 20 mg, HCTZ 25 mg, Chlorthalidone 50 mg. Patient is following physician recommended management. Checking blood pressure outside of physician office. Results average systolic 135-145 and average diastolic 80-90. Associated symptoms do not include headache, dizziness, nausea, lower extremity swelling, shortness of breath, chest pain, numbness. For his cholesterol he is taking simvastatin 20 mg a day with no side effects.    Past Medical History  Diagnosis Date  . Stroke (HCC)   . Hyperlipidemia   . Hypertension     Patient Active Problem List   Diagnosis Date Noted  . Pre-diabetes 02/18/2015  . Atherosclerosis of both carotid arteries 02/18/2015  . 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 Shoulder subluxation, right 04/23/2014  . Spastic hemiplegia affecting dominant  side (HCC) 02/18/2014  . Hypertension goal BP (blood pressure) < 140/90 01/17/2014  . Hyperlipidemia LDL goal <70 01/17/2014    Social History  Substance Use Topics  . Smoking status: Never Smoker   . Smokeless tobacco: Not on file  . Alcohol Use: No     Current outpatient prescriptions:  .  aspirin EC 325 MG tablet, Take 1 tablet (325 mg total) by mouth daily., Disp: 90 tablet, Rfl: 3 .  chlorthalidone (HYGROTON) 50 MG tablet, TAKE 1 TABLET EVERY DAY, Disp: 90 tablet, Rfl: 1 .  lisinopril (PRINIVIL,ZESTRIL) 40 MG tablet, Take 1 tablet (40 mg total) by mouth daily., Disp: 90 tablet, Rfl: 3 .  meloxicam (MOBIC) 15 MG tablet, TAKE 1 TABLET BY MOUTH DAILY TAKE WITH FOOD, Disp: 30 tablet, Rfl: 5 .  Multiple Vitamins-Minerals (ONE-A-DAY MENS HEALTH FORMULA) TABS, Take 1 tablet by mouth daily., Disp: , Rfl:  .  Omega-3 Fatty Acids (FISH OIL) 1000 MG CAPS, Take 1,000 mg by mouth daily., Disp: , Rfl:  .  Polyvinyl Alcohol-Povidone (MURINE TEARS FOR DRY EYES OP), Place 1 drop into both eyes 3 (three) times a week., Disp: , Rfl:  .  simvastatin (ZOCOR) 20 MG tablet, TAKE 1 TABLET EVERY DAY, Disp: 90 tablet, Rfl: 1  Past Surgical History  Procedure Laterality Date  . Knee arthroscopy Right     Family History  Problem Relation Age of Onset  . Hypertension Mother     No Known Allergies   Review of Systems  CONSTITUTIONAL: No significant weight changes, fever, chills, weakness or fatigue.  HEENT:  - Eyes:  No visual changes.  - Ears: No auditory changes.  - Nose: No sneezing, congestion, runny nose. - Throat: No sore throat. No changes in swallowing. SKIN: No rash or itching.  CARDIOVASCULAR: No chest pain, chest pressure or chest discomfort. No palpitations or edema.  RESPIRATORY: No shortness of breath, cough or sputum.  NEUROLOGICAL: Yes paralysis of right UE. No headache, dizziness, syncope, ataxia, numbness or tingling in the extremities. No memory changes. No change in  bowel or bladder control.  MUSCULOSKELETAL: Yes right shoulder joint pain. No muscle pain. ENDOCRINOLOGIC: No reports of sweating, cold or heat intolerance. No polyuria or polydipsia.    Objective  BP 138/82 mmHg  Pulse 96  Temp(Src) 97.8 F (36.6 C) (Oral)  Resp 16  Ht  (1.803 m)  Wt 220 lb (99.791 kg)  BMI 30.70 kg/m2  SpO2 98% Body mass index is 30.7 kg/(m^2).  Physical Exam  Constitutional: Patient appears well-developed and well-nourished. In no distress.  HEENT:  - Head: Normocephalic and atraumatic.  - Ears: Bilateral TMs gray, no erythema or effusion - Nose: Nasal mucosa moist - Mouth/Throat: Oropharynx is clear and moist. No tonsillar hypertrophy or erythema. No post nasal drainage.  - Eyes: Conjunctivae clear, EOM movements normal. PERRLA. No scleral icterus.  Neck: Normal range of motion. Neck supple. No JVD present. No thyromegaly present.  Cardiovascular: Normal rate, regular rhythm and normal heart sounds. No murmur heard.  Pulmonary/Chest: Effort normal and breath sounds normal. No respiratory distress. Musculoskeletal: Normal range of motion bilateral LE, no joint effusions. LE strength 5/5 bilaterally. Left UE strength 5/5. Notable persistent weakness in his right UE 3/5 strength worse in the distal component with hand grip being 2/5 in strength. Notable modest contracture of fingers of right hand. Good muscle tone. Sensation intact throughout.  Peripheral vascular: Bilateral LE no edema. Psychiatric: Patient has a normal mood and affect. Behavior is normal   Assessment & Plan   1. Hypertension goal BP (blood pressure) < 140/90 Well controled.  - chlorthalidone (HYGROTON) 50 MG tablet; Take 1 tablet (50 mg total) by mouth daily.  Dispense: 90 tablet; Refill: 1  2. Hyperlipidemia LDL goal <70 Continue statin.  - simvastatin (ZOCOR) 20 MG tablet; Take 1 tablet (20 mg total) by mouth daily.  Dispense: 90 tablet; Refill: 1  3. Flexion contracture  of right elbow Continue PT, improved.  4. Spastic hemiplegia affecting dominant side (HCC) Stable.  5. Left ear pain Unremarkable findings. May be allergy/fluid related. Monitor.  6. Need for Tdap vaccination  - Tdap vaccine greater than or equal to 7yo IM

## 2015-08-26 ENCOUNTER — Encounter: Payer: Self-pay | Admitting: Physical Medicine & Rehabilitation

## 2015-08-26 ENCOUNTER — Ambulatory Visit (HOSPITAL_BASED_OUTPATIENT_CLINIC_OR_DEPARTMENT_OTHER): Payer: BLUE CROSS/BLUE SHIELD | Admitting: Physical Medicine & Rehabilitation

## 2015-08-26 ENCOUNTER — Encounter: Payer: BLUE CROSS/BLUE SHIELD | Attending: Physical Medicine & Rehabilitation

## 2015-08-26 VITALS — BP 132/79 | HR 90 | Resp 16

## 2015-08-26 DIAGNOSIS — Z7982 Long term (current) use of aspirin: Secondary | ICD-10-CM | POA: Insufficient documentation

## 2015-08-26 DIAGNOSIS — M25511 Pain in right shoulder: Secondary | ICD-10-CM | POA: Insufficient documentation

## 2015-08-26 DIAGNOSIS — G8110 Spastic hemiplegia affecting unspecified side: Secondary | ICD-10-CM | POA: Diagnosis not present

## 2015-08-26 DIAGNOSIS — I69351 Hemiplegia and hemiparesis following cerebral infarction affecting right dominant side: Secondary | ICD-10-CM | POA: Insufficient documentation

## 2015-08-26 NOTE — Progress Notes (Signed)
Botox Injection for spasticity using needle EMG guidance  Dilution: 50 Units/ml Indication: Severe spasticity which interferes with ADL,mobility and/or  hygiene and is unresponsive to medication management and other conservative care Informed consent was obtained after describing risks and benefits of the procedure with the patient. This includes bleeding, bruising, infection, excessive weakness, or medication side effects. A REMS form is on file and signed. Needle: 50mm 25g needle electrode Number of units per muscle Biceps100 Brachialis 100 FCR25 FCU25 FDS25 FDP25 PT50 Brachioradialis 50 All injections were done after obtaining appropriate EMG activity and after negative drawback for blood. The patient tolerated the procedure well. Post procedure instructions were given. A followup appointment was made.

## 2015-08-26 NOTE — Patient Instructions (Signed)

## 2015-09-26 ENCOUNTER — Other Ambulatory Visit: Payer: Self-pay

## 2015-09-26 MED ORDER — MELOXICAM 15 MG PO TABS: 15.0000 mg | ORAL_TABLET | Freq: Every day | ORAL | Status: DC | PRN

## 2015-09-26 NOTE — Telephone Encounter (Signed)
Reviewed last two BPs in system; last Cr normal; last CBC normal; Rx approved

## 2015-09-29 ENCOUNTER — Other Ambulatory Visit: Payer: Self-pay | Admitting: Family Medicine

## 2015-10-06 ENCOUNTER — Ambulatory Visit (HOSPITAL_BASED_OUTPATIENT_CLINIC_OR_DEPARTMENT_OTHER): Payer: BLUE CROSS/BLUE SHIELD | Admitting: Physical Medicine & Rehabilitation

## 2015-10-06 ENCOUNTER — Encounter: Payer: Self-pay | Admitting: Physical Medicine & Rehabilitation

## 2015-10-06 ENCOUNTER — Encounter: Payer: BLUE CROSS/BLUE SHIELD | Attending: Physical Medicine & Rehabilitation

## 2015-10-06 VITALS — BP 156/82 | HR 93 | Resp 15

## 2015-10-06 DIAGNOSIS — Z7982 Long term (current) use of aspirin: Secondary | ICD-10-CM | POA: Diagnosis not present

## 2015-10-06 DIAGNOSIS — M25511 Pain in right shoulder: Secondary | ICD-10-CM | POA: Diagnosis not present

## 2015-10-06 DIAGNOSIS — I69351 Hemiplegia and hemiparesis following cerebral infarction affecting right dominant side: Secondary | ICD-10-CM | POA: Insufficient documentation

## 2015-10-06 DIAGNOSIS — M24521 Contracture, right elbow: Secondary | ICD-10-CM | POA: Diagnosis not present

## 2015-10-06 DIAGNOSIS — G8110 Spastic hemiplegia affecting unspecified side: Secondary | ICD-10-CM

## 2015-10-06 NOTE — Progress Notes (Signed)
Subjective:    Patient ID: [name], male    DOB: 11/24/1955, 60 y.o.   MRN: [id number] botox 08/26/15 Biceps100 Brachialis 100 FCR25 FCU25 FDS25 FDP25 PT50 Brachioradialis 50 HPI Rides exercise bike Botox dose seemed like it was helpful for allowing a patient to reach around his back and extend elbow better. He is not flexed as much when he is walking. He is still able to use his hand on the bicycle grip Interval medical history is otherwise negative. Pain Inventory Average Pain 1 Pain Right Now 1 My pain is intermittent, tingling and aching  In the last 24 hours, has pain interfered with the following? General activity 0 Relation with others 0 Enjoyment of life 0 What TIME of day is your pain at its worst? night Sleep (in general) Good  Pain is worse with: inactivity Pain improves with: therapy/exercise and TENS Relief from Meds: 2  Mobility walk without assistance how many minutes can you walk? 5 ability to climb steps?  yes do you drive?  yes Do you have any goals in this area?  yes  Function disabled: date disabled 12/2013 I need assistance with the following:  meal prep  Neuro/Psych No problems in this area  Prior Studies Any changes since last visit?  no  Physicians involved in your care Any changes since last visit?  no   Family History  Problem Relation Age of Onset  . Hypertension Mother    Social History   Social History  . Marital Status: Married    Spouse Name: N/A  . Number of Children: N/A  . Years of Education: N/A   Social History Main Topics  . Smoking status: Never Smoker   . Smokeless tobacco: None  . Alcohol Use: No  . Drug Use: No  . Sexual Activity: No   Other Topics Concern  . None   Social History Narrative   Past Surgical History  Procedure Laterality Date  . Knee arthroscopy Right    Past Medical History  Diagnosis Date  . Stroke (HCC)   . Hyperlipidemia   . Hypertension    BP 156/82 mmHg  Pulse  93  Resp 15  SpO2 99%  Opioid Risk Score:   Fall Risk Score:  `1  Depression screen PHQ 2/9  Depression screen [facility] 2/9 10/06/2015 08/26/2015 07/21/2015 02/18/2015 02/18/2015 01/23/2015 12/17/2014  Decreased Interest 0 0 0 0 0 0 0  Down, Depressed, Hopeless - 0 0 0 0 0 0  PHQ - 2 Score 0 0 0 0 0 0 0  Altered sleeping - - - - - - -  Tired, decreased energy - - - - - - -  Change in appetite - - - - - - -  Feeling bad or failure about yourself  - - - - - - -  Trouble concentrating - - - - - - -  Moving slowly or fidgety/restless - - - - - - -  Suicidal thoughts - - - - - - -  PHQ-9 Score - - - - - - -      Review of Systems  All other systems reviewed and are negative.      Objective:   Physical Exam  Constitutional: He is oriented to person, place, and time. He appears well-developed and well-nourished.  HENT:  Head: Normocephalic and atraumatic.  Eyes: Conjunctivae and EOM are normal. Pupils are equal, round, and reactive to light.  Neck: Normal range of motion.  Neurological: He is alert  and oriented to person, place, and time.  Psychiatric: He has a normal mood and affect.  Nursing note and vitals reviewed. Right upper extremity tone Ashworth grade 1 at the finger flexors Ashworth grade 0 at the wrist flexors Ashworth grade 2-3 at the elbow flexors Ashworth grade 2 at the pectoralis  Motor strength is 3 minus in the right deltoid biceps triceps 2 minus finger flexors 0 at the finger extensors and wrist extensors      Assessment & Plan:   1. Right spastic hemiplegia secondary to left CVA, Left basal ganglia infarct 12/24/2013. He has plateaued from a functional standpoint but still has severe spasticity in the right upper limb. He has improved his ability to reach around his back after Botox injections.His finger flexor and wrist flexor tone is very low. He still has excessive tone at the elbow flexors. We will make the following adjustments to his next injection in 6  weeks  Biceps125 Brachialis 125 FCR25 FCU0 FDS0 FDP25 PT50 Brachioradialis 50

## 2015-10-06 NOTE — Patient Instructions (Signed)

## 2015-11-18 ENCOUNTER — Encounter: Payer: Self-pay | Admitting: Family Medicine

## 2015-11-18 ENCOUNTER — Ambulatory Visit (INDEPENDENT_AMBULATORY_CARE_PROVIDER_SITE_OTHER): Payer: BLUE CROSS/BLUE SHIELD | Admitting: Family Medicine

## 2015-11-18 VITALS — BP 124/80 | HR 90 | Temp 97.9°F | Resp 16 | Wt 221.0 lb

## 2015-11-18 DIAGNOSIS — R7303 Prediabetes: Secondary | ICD-10-CM | POA: Diagnosis not present

## 2015-11-18 DIAGNOSIS — E785 Hyperlipidemia, unspecified: Secondary | ICD-10-CM

## 2015-11-18 DIAGNOSIS — I10 Essential (primary) hypertension: Secondary | ICD-10-CM

## 2015-11-18 DIAGNOSIS — Z5181 Encounter for therapeutic drug level monitoring: Secondary | ICD-10-CM | POA: Diagnosis not present

## 2015-11-18 DIAGNOSIS — G8110 Spastic hemiplegia affecting unspecified side: Secondary | ICD-10-CM | POA: Diagnosis not present

## 2015-11-18 DIAGNOSIS — I6523 Occlusion and stenosis of bilateral carotid arteries: Secondary | ICD-10-CM

## 2015-11-18 DIAGNOSIS — I63512 Cerebral infarction due to unspecified occlusion or stenosis of left middle cerebral artery: Secondary | ICD-10-CM

## 2015-11-18 DIAGNOSIS — M24521 Contracture, right elbow: Secondary | ICD-10-CM

## 2015-11-18 LAB — CBC WITH DIFFERENTIAL/PLATELET
Basophils Absolute: 43 cells/uL (ref 0–200)
Basophils Relative: 1 %
Eosinophils Absolute: 172 cells/uL (ref 15–500)
Eosinophils Relative: 4 %
HCT: 41.4 % (ref 38.5–50.0)
Hemoglobin: 13.4 g/dL (ref 13.2–17.1)
Lymphocytes Relative: 31 %
Lymphs Abs: 1333 cells/uL (ref 850–3900)
MCH: 27.7 pg (ref 27.0–33.0)
MCHC: 32.4 g/dL (ref 32.0–36.0)
MCV: 85.7 fL (ref 80.0–100.0)
MPV: 10.1 fL (ref 7.5–12.5)
Monocytes Absolute: 430 cells/uL (ref 200–950)
Monocytes Relative: 10 %
Neutro Abs: 2322 cells/uL (ref 1500–7800)
Neutrophils Relative %: 54 %
Platelets: 339 10*3/uL (ref 140–400)
RBC: 4.83 MIL/uL (ref 4.20–5.80)
RDW: 13.9 % (ref 11.0–15.0)
WBC: 4.3 10*3/uL (ref 3.8–10.8)

## 2015-11-18 NOTE — Assessment & Plan Note (Signed)
Continues to work on therapy; sees PM&R doctor; wearing AFO; continue aspirin, statin; monitor carotids

## 2015-11-18 NOTE — Assessment & Plan Note (Signed)
Continue [place]hab doctor in [place]

## 2015-11-18 NOTE — Assessment & Plan Note (Signed)
Seeing specialist; occasional botox

## 2015-11-18 NOTE — Progress Notes (Signed)
BP 124/80 mmHg  Pulse 90  Temp(Src) 97.9 F (36.6 C) (Oral)  Resp 16  Wt 221 lb (100.245 kg)  SpO2 98%   Subjective:    Patient ID: Thomas NayAnthony Chan, male    DOB: 09/28/1955, 60 y.o.   MRN: [id number]  HPI: Thomas Naynthony Chan is a 60 y.o. male  Chief Complaint  Patient presents with  . Medication Refill   Patient is new to me; his previous provider left the practice He had a stroke in 2015; affected right arm and leg He did 33 days of therapy Doing exercise every night; doing bicycle; living pretty normal On fish oil and aspirin 325 mg daily and simvastatin  Taking meloxicam at night; takes the aspirin in the morning  HTN; on the ACE-I and chlorthalidone; well-controlled; no added salt; knows to stay away from decongestants  Prediabetes range glucose readings reviewed over last year; his aunts and uncles had diabetes; one uncle died young from diabetes Lab Results  Component Value Date   HGBA1C 6.2* 03/11/2015    [facility] 2/9 11/18/2015 10/06/2015 08/26/2015 07/21/2015 02/18/2015  Decreased Interest 0 0 0 0 0  Down, Depressed, Hopeless 0 - 0 0 0  PHQ - 2 Score 0 0 0 0 0  Altered sleeping - - - - -  Tired, decreased energy - - - - -  Change in appetite - - - - -  Feeling bad or failure about yourself  - - - - -  Trouble concentrating - - - - -  Moving slowly or fidgety/restless - - - - -  Suicidal thoughts - - - - -  PHQ-9 Score - - - - -   Relevant past medical, surgical, family and social history reviewed Past Medical History  Diagnosis Date  . Stroke (HCC)   . Hyperlipidemia   . Hypertension    Past Surgical History  Procedure Laterality Date  . Knee arthroscopy Right    Family History  Problem Relation Age of Onset  . Hypertension Mother   MD notes: diabetes in aunts and uncles, cousins  Social History  Substance Use Topics  . Smoking status: Never Smoker   . Smokeless tobacco: None  . Alcohol Use: No  [employer], played  football  Interim medical history since last visit reviewed. Allergies and medications reviewed  Review of Systems Per HPI unless specifically indicated above     Objective:    BP 124/80 mmHg  Pulse 90  Temp(Src) 97.9 F (36.6 C) (Oral)  Resp 16  Wt 221 lb (100.245 kg)  SpO2 98%  Wt Readings from Last 3 Encounters:  11/18/15 221 lb (100.245 kg)  07/21/15 220 lb (99.791 kg)  03/18/15 216 lb 12.8 oz (98.34 kg)    Physical Exam  Constitutional: He appears well-developed and well-nourished. No distress.  HENT:  Head: Normocephalic and atraumatic.  Eyes: EOM are normal. No scleral icterus.  Neck: Carotid bruit is not present. No thyromegaly present.  Cardiovascular: Normal rate and regular rhythm.   Pulmonary/Chest: Effort normal and breath sounds normal.  Abdominal: Soft. Bowel sounds are normal. He exhibits no distension.  Musculoskeletal: He exhibits no edema.  Right AFO  Neurological: Coordination normal.  Wearing right AFO; contracture of right elbow; grip 2/5 right hand  Skin: Skin is warm and dry. No pallor.  Psychiatric: He has a normal mood and affect. His behavior is normal. Judgment and thought content normal.   Results for orders placed or performed in visit on  02/18/15  CBC with Differential/Platelet  Result Value Ref Range   WBC 4.2 3.4 - 10.8 x10E3/uL   RBC 4.94 4.14 - 5.80 x10E6/uL   Hemoglobin 13.7 12.6 - 17.7 g/dL   Hematocrit 96.0 45.4 - 51.0 %   MCV 86 79 - 97 fL   MCH 27.7 26.6 - 33.0 pg   MCHC 32.2 31.5 - 35.7 g/dL   RDW 09.8 11.9 - 14.7 %   Platelets 290 150 - 379 x10E3/uL   Neutrophils 52 %   Lymphs 38 %   Monocytes 7 %   Eos 2 %   Basos 1 %   Neutrophils Absolute 2.2 1.4 - 7.0 x10E3/uL   Lymphocytes Absolute 1.6 0.7 - 3.1 x10E3/uL   Monocytes Absolute 0.3 0.1 - 0.9 x10E3/uL   EOS (ABSOLUTE) 0.1 0.0 - 0.4 x10E3/uL   Basophils Absolute 0.0 0.0 - 0.2 x10E3/uL   Immature Granulocytes 0 %   Immature Grans (Abs) 0.0 0.0 - 0.1 x10E3/uL   Comprehensive metabolic panel  Result Value Ref Range   Glucose 101 (H) 65 - 99 mg/dL   BUN 14 8 - 27 mg/dL   Creatinine, Ser 8.29 0.76 - 1.27 mg/dL   GFR calc non Af Amer 83 >59 mL/min/1.73   GFR calc Af Amer 96 >59 mL/min/1.73   BUN/Creatinine Ratio 14 10 - 22   Sodium 137 136 - 144 mmol/L   Potassium 3.6 3.5 - 5.2 mmol/L   Chloride 94 (L) 97 - 106 mmol/L   CO2 28 18 - 29 mmol/L   Calcium 9.7 8.6 - 10.2 mg/dL   Total Protein 7.4 6.0 - 8.5 g/dL   Albumin 4.6 3.6 - 4.8 g/dL   Globulin, Total 2.8 1.5 - 4.5 g/dL   Albumin/Globulin Ratio 1.6 1.1 - 2.5   Bilirubin Total 0.3 0.0 - 1.2 mg/dL   Alkaline Phosphatase 66 39 - 117 IU/L   AST 24 0 - 40 IU/L   ALT 25 0 - 44 IU/L  Lipid panel  Result Value Ref Range   Cholesterol, Total 198 100 - 199 mg/dL   Triglycerides 48 0 - 149 mg/dL   HDL 58 >56 mg/dL   VLDL Cholesterol Cal 10 5 - 40 mg/dL   LDL Calculated 213 (H) 0 - 99 mg/dL   Chol/HDL Ratio 3.4 0.0 - 5.0 ratio units  TSH  Result Value Ref Range   TSH 4.330 0.450 - 4.500 uIU/mL  Hemoglobin A1c  Result Value Ref Range   Hgb A1c MFr Bld 6.2 (H) 4.8 - 5.6 %   Est. 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)    Continues to work on therapy; sees PM&R doctor; wearing AFO; continue aspirin, statin; monitor carotids      Atherosclerosis of both carotid arteries    Order another carotid US; reviewed last study from 2015; stay on statin and aspirin      Relevant Orders   US Carotid Duplex Bilateral   Lipid panel     Nervous and Auditory   Spastic hemiplegia affecting dominant side (HCC)    Seeing specialist; occasional botox        Musculoskeletal and  Integument    Flexion contracture of right elbow    Continue seeing rehab doctor in [place]        Other   Pre-diabetes    Check A1c      Relevant Orders   Hemoglobin A1c   Hyperlipidemia LDL goal <70    Check lipids      Relevant Orders   Lipid panel    Other Visit Diagnoses    Medication monitoring encounter        Relevant Orders    COMPLETE METABOLIC PANEL WITH GFR    CBC with Differential/Platelet       Follow up plan: Return in about 6 months (around 05/19/2016) for follow-up and fasting labs.  An after-visit summary was printed and given to the patient at check-out.  Please see the patient instructions which may contain other information and recommendations beyond what is mentioned above in the assessment and plan.  Orders Placed This Encounter  Procedures  . US Carotid Duplex Bilateral  . Lipid panel  . Hemoglobin A1c  . COMPLETE METABOLIC PANEL WITH GFR  . CBC with Differential/Platelet

## 2015-11-18 NOTE — Assessment & Plan Note (Signed)
Check A1c. 

## 2015-11-18 NOTE — Patient Instructions (Addendum)
Your goal blood pressure is less than 130 mmHg on top (ideal), but at least under 140 Try to follow the DASH guidelines (DASH stands for Dietary Approaches to Stop Hypertension) Try to limit the sodium in your diet.  Ideally, consume less than 1.5 grams (less than 1,500mg ) per day. Do not add salt when cooking or at the table.  Check the sodium amount on labels when shopping, and choose items lower in sodium when given a choice. Avoid or limit foods that already contain a lot of sodium. Eat a diet rich in fruits and vegetables and whole grains.  Avoid salt substitutes  Try to limit saturated fats in your diet (bologna, hot dogs, barbeque, cheeseburgers, hamburgers, steak, bacon, sausage, cheese, etc.) and get more fresh fruits, vegetables, and whole grains  DASH Eating Plan DASH stands for "Dietary Approaches to Stop Hypertension." The DASH eating plan is a healthy eating plan that has been shown to reduce high blood pressure (hypertension). Additional health benefits may include reducing the risk of type 2 diabetes mellitus, heart disease, and stroke. The DASH eating plan may also help with weight loss. WHAT DO I NEED TO KNOW ABOUT THE DASH EATING PLAN? For the DASH eating plan, you will follow these general guidelines:  Choose foods with a percent daily value for sodium of less than 5% (as listed on the food label).  Use salt-free seasonings or herbs instead of table salt or sea salt.  Check with your health care provider or pharmacist before using salt substitutes.  Eat lower-sodium products, often labeled as "lower sodium" or "no salt added."  Eat fresh foods.  Eat more vegetables, fruits, and low-fat dairy products.  Choose whole grains. Look for the word "whole" as the first word in the ingredient list.  Choose fish and skinless chicken or [place] more often than red meat. Limit fish, poultry, and meat to 6 oz (170 g) each day.  Limit sweets, desserts, sugars, and sugary  drinks.  Choose heart-healthy fats.  Limit cheese to 1 oz (28 g) per day.  Eat more home-cooked food and less restaurant, buffet, and fast food.  Limit fried foods.  Cook foods using methods other than frying.  Limit canned vegetables. If you do use them, rinse them well to decrease the sodium.  When eating at a restaurant, ask that your food be prepared with less salt, or no salt if possible. WHAT FOODS CAN I EAT? Seek help from a dietitian for individual calorie needs. Grains Whole grain or whole wheat bread. Brown rice. Whole grain or whole wheat pasta. Quinoa, bulgur, and whole grain cereals. Low-sodium cereals. Corn or whole wheat flour tortillas. Whole grain cornbread. Whole grain crackers. Low-sodium crackers. Vegetables Fresh or frozen vegetables (raw, steamed, roasted, or grilled). Low-sodium or reduced-sodium tomato and vegetable juices. Low-sodium or reduced-sodium tomato sauce and paste. Low-sodium or reduced-sodium canned vegetables.  Fruits All fresh, canned (in natural juice), or frozen fruits. Meat and Other Protein Products Ground beef (85% or leaner), grass-fed beef, or beef trimmed of fat. Skinless chicken or [place]. Ground chicken or [place]. Pork trimmed of fat. All fish and seafood. Eggs. Dried beans, peas, or lentils. Unsalted nuts and seeds. Unsalted canned beans. Dairy Low-fat dairy products, such as skim or 1% milk, 2% or reduced-fat cheeses, low-fat ricotta or cottage cheese, or plain low-fat yogurt. Low-sodium or reduced-sodium cheeses. Fats and Oils Tub margarines without trans fats. Light or reduced-fat mayonnaise and salad dressings (reduced sodium). Avocado. Safflower, olive, or canola oils. Natural  peanut or almond butter. Other Unsalted popcorn and pretzels. The items listed above may not be a complete list of recommended foods or beverages. Contact your dietitian for more options. WHAT FOODS ARE NOT RECOMMENDED? Grains White bread. White pasta.  White rice. Refined cornbread. Bagels and croissants. Crackers that contain trans fat. Vegetables Creamed or fried vegetables. Vegetables in a cheese sauce. Regular canned vegetables. Regular canned tomato sauce and paste. Regular tomato and vegetable juices. Fruits Dried fruits. Canned fruit in light or heavy syrup. Fruit juice. Meat and Other Protein Products Fatty cuts of meat. Ribs, chicken wings, bacon, sausage, bologna, salami, chitterlings, fatback, hot dogs, bratwurst, and packaged luncheon meats. Salted nuts and seeds. Canned beans with salt. Dairy Whole or 2% milk, cream, half-and-half, and cream cheese. Whole-fat or sweetened yogurt. Full-fat cheeses or blue cheese. Nondairy creamers and whipped toppings. Processed cheese, cheese spreads, or cheese curds. Condiments Onion and garlic salt, seasoned salt, table salt, and sea salt. Canned and packaged gravies. Worcestershire sauce. Tartar sauce. Barbecue sauce. Teriyaki sauce. Soy sauce, including reduced sodium. Steak sauce. Fish sauce. Oyster sauce. Cocktail sauce. Horseradish. Ketchup and mustard. Meat flavorings and tenderizers. Bouillon cubes. Hot sauce. Tabasco sauce. Marinades. Taco seasonings. Relishes. Fats and Oils Butter, stick margarine, lard, shortening, ghee, and bacon fat. Coconut, palm kernel, or palm oils. Regular salad dressings. Other Pickles and olives. Salted popcorn and pretzels. The items listed above may not be a complete list of foods and beverages to avoid. Contact your dietitian for more information. WHERE CAN I FIND MORE INFORMATION? National Heart, Lung, and Blood Institute: CableProm[employer].itwww.nhlbi.nih.gov/health/health-topics/topics/dash/   This information is not intended to replace advice given to you by your health care provider. Make sure you discuss any questions you have with your health care provider.   Document Released: 04/29/2011 Document Revised: 05/31/2014 Document Reviewed: 03/14/2013 Elsevier Interactive  Patient Education [employer]2016 Elsevier Inc.

## 2015-11-18 NOTE — Assessment & Plan Note (Signed)
Check lipids 

## 2015-11-18 NOTE — Assessment & Plan Note (Signed)
Well-controlled; continue medicines; try to DASH guidelines

## 2015-11-18 NOTE — Assessment & Plan Note (Signed)
Order another carotid US; reviewed last study from 2015; stay on statin and aspirin

## 2015-11-19 LAB — COMPLETE METABOLIC PANEL WITH GFR
ALBUMIN: 4.4 g/dL (ref 3.6–5.1)
ALK PHOS: 57 U/L (ref 40–115)
ALT: 20 U/L (ref 9–46)
AST: 17 U/L (ref 10–35)
BILIRUBIN TOTAL: 0.4 mg/dL (ref 0.2–1.2)
BUN: 14 mg/dL (ref 7–25)
CALCIUM: 9.6 mg/dL (ref 8.6–10.3)
CO2: 28 mmol/L (ref 20–31)
Chloride: 94 mmol/L — ABNORMAL LOW (ref 98–110)
Creat: 1.03 mg/dL (ref 0.70–1.25)
GFR, EST NON AFRICAN AMERICAN: 78 mL/min (ref >=60)
GFR, Est African American: >89 mL/min (ref >=60)
GLUCOSE: 103 mg/dL — AB (ref 65–99)
POTASSIUM: 4.2 mmol/L (ref 3.5–5.3)
SODIUM: 136 mmol/L (ref 135–146)
TOTAL PROTEIN: 7.3 g/dL (ref 6.1–8.1)

## 2015-11-19 LAB — LIPID PANEL
CHOLESTEROL: 190 mg/dL (ref 125–200)
HDL: 48 mg/dL (ref >=40)
LDL CALC: 131 mg/dL — AB (ref <130)
TRIGLYCERIDES: 56 mg/dL (ref <150)
Total CHOL/HDL Ratio: 4 Ratio (ref <=5.0)
VLDL: 11 mg/dL (ref <30)

## 2015-11-19 LAB — HEMOGLOBIN A1C
Hgb A1c MFr Bld: 6 % — ABNORMAL HIGH (ref <5.7)
Mean Plasma Glucose: 126 mg/dL

## 2015-11-26 ENCOUNTER — Other Ambulatory Visit: Payer: Self-pay | Admitting: Family Medicine

## 2015-11-26 DIAGNOSIS — Z5181 Encounter for therapeutic drug level monitoring: Secondary | ICD-10-CM | POA: Insufficient documentation

## 2015-11-26 DIAGNOSIS — E785 Hyperlipidemia, unspecified: Secondary | ICD-10-CM

## 2015-11-26 MED ORDER — SIMVASTATIN 40 MG PO TABS: 40.0000 mg | ORAL_TABLET | Freq: Every day | ORAL | Status: DC

## 2015-11-26 NOTE — Assessment & Plan Note (Signed)
Increase statin; recheck labs in 6 weeks 

## 2015-11-27 ENCOUNTER — Telehealth: Payer: Self-pay | Admitting: Family Medicine

## 2015-11-27 NOTE — Telephone Encounter (Signed)
These are the instructions I sent him on 11/26/15 at 3:52 pm: Your total cholesterol has improved from a year ago, but your LDL is still higher than ideal. I'm going to suggest that we increase your simvastatin from 20 mg every night to 40 mg every night. Let's recheck your fasting lipids and one liver enzyme in 6-8 weeks.

## 2015-11-27 NOTE — Telephone Encounter (Signed)
Patient notified

## 2015-11-27 NOTE — Telephone Encounter (Signed)
Patient normal take Simvastatin 20 mg, however this last time you prescribed 40 mg. Do you want the patient to cut back so he will only be taking 20 mg or did you mean to prescribe 40 mg. Please advise

## 2015-11-27 NOTE — Telephone Encounter (Signed)
Patient called back and I read him what was written below. Patient verbalized understanding.

## 2015-12-01 ENCOUNTER — Encounter: Payer: Self-pay | Admitting: Physical Medicine & Rehabilitation

## 2015-12-01 ENCOUNTER — Ambulatory Visit (HOSPITAL_BASED_OUTPATIENT_CLINIC_OR_DEPARTMENT_OTHER): Payer: BLUE CROSS/BLUE SHIELD | Admitting: Physical Medicine & Rehabilitation

## 2015-12-01 ENCOUNTER — Encounter: Payer: BLUE CROSS/BLUE SHIELD | Attending: Physical Medicine & Rehabilitation

## 2015-12-01 VITALS — BP 125/78 | HR 85 | Resp 14

## 2015-12-01 DIAGNOSIS — M25511 Pain in right shoulder: Secondary | ICD-10-CM | POA: Diagnosis not present

## 2015-12-01 DIAGNOSIS — G8110 Spastic hemiplegia affecting unspecified side: Secondary | ICD-10-CM

## 2015-12-01 DIAGNOSIS — I69351 Hemiplegia and hemiparesis following cerebral infarction affecting right dominant side: Secondary | ICD-10-CM | POA: Insufficient documentation

## 2015-12-01 DIAGNOSIS — Z7982 Long term (current) use of aspirin: Secondary | ICD-10-CM | POA: Insufficient documentation

## 2015-12-01 NOTE — Progress Notes (Signed)
   Subjective:    Patient ID: [name], male    DOB: 05/21/1956, 60 y.o.   MRN: [id number]  HPI    Review of Systems     Objective:   Physical Exam        Assessment & Plan:     Botox Injection for spasticity using needle EMG guidance  Dilution: 50 Units/ml Indication: Severe spasticity which interferes with ADL,mobility and/or  hygiene and is unresponsive to medication management and other conservative care Informed consent was obtained after describing risks and benefits of the procedure with the patient. This includes bleeding, bruising, infection, excessive weakness, or medication side effects. A REMS form is on file and signed. Needle: 27g 1" needle electrode Number of units per muscle Biceps125 Brachialis 125 FCR25 FCU0 FDS0 FDP25 PT50 Brachioradialis 50  All injections were done after obtaining appropriate EMG activity and after negative drawback for blood. The patient tolerated the procedure well. Post procedure instructions were given. A followup appointment was made.

## 2015-12-01 NOTE — Patient Instructions (Signed)

## 2015-12-17 ENCOUNTER — Telehealth: Payer: Self-pay | Admitting: Physical Medicine & Rehabilitation

## 2015-12-17 NOTE — Telephone Encounter (Signed)
Patient needs Rx for Right Shoe that is slick on bottom  per [facility] It is attached to patient existing shoe.

## 2015-12-18 ENCOUNTER — Other Ambulatory Visit: Payer: Self-pay

## 2015-12-18 NOTE — Telephone Encounter (Signed)
Pt ins wants 90 day supply

## 2015-12-18 NOTE — Telephone Encounter (Signed)
Please call [employer] to place a leather  sole on shoe toe area only

## 2015-12-18 NOTE — Telephone Encounter (Signed)
I prescribed 30+1 on July 5th; too soon to request refill

## 2015-12-19 NOTE — Telephone Encounter (Signed)
Order faxed.

## 2015-12-23 HISTORY — PX: HERNIA REPAIR: SHX51

## 2016-01-12 ENCOUNTER — Ambulatory Visit (HOSPITAL_BASED_OUTPATIENT_CLINIC_OR_DEPARTMENT_OTHER): Payer: BLUE CROSS/BLUE SHIELD | Admitting: Physical Medicine & Rehabilitation

## 2016-01-12 ENCOUNTER — Encounter: Payer: BLUE CROSS/BLUE SHIELD | Attending: Physical Medicine & Rehabilitation

## 2016-01-12 ENCOUNTER — Encounter: Payer: Self-pay | Admitting: Physical Medicine & Rehabilitation

## 2016-01-12 VITALS — BP 123/81 | HR 89 | Resp 16

## 2016-01-12 DIAGNOSIS — I69351 Hemiplegia and hemiparesis following cerebral infarction affecting right dominant side: Secondary | ICD-10-CM | POA: Diagnosis present

## 2016-01-12 DIAGNOSIS — G8110 Spastic hemiplegia affecting unspecified side: Secondary | ICD-10-CM | POA: Diagnosis not present

## 2016-01-12 DIAGNOSIS — Z7982 Long term (current) use of aspirin: Secondary | ICD-10-CM | POA: Diagnosis not present

## 2016-01-12 DIAGNOSIS — M25511 Pain in right shoulder: Secondary | ICD-10-CM | POA: Diagnosis not present

## 2016-01-12 NOTE — Patient Instructions (Signed)
Stay active.

## 2016-01-12 NOTE — Progress Notes (Signed)
   Subjective:    Patient ID: [name], male    DOB: 11/26/1955, 60 y.o.   MRN: [id number]  Botox 12/01/15 Biceps125 Brachialis 125 FCR25 FCU0 FDS0 FDP25 PT50 Brachioradialis 50 HPI Doing ok Pain Inventory Average Pain 1 Pain Right Now 1 My pain is intermittent and aching  In the last 24 hours, has pain interfered with the following? General activity 0 Relation with others 0 Enjoyment of life 0 What TIME of day is your pain at its worst? night Sleep (in general) Good  Pain is worse with: inactivity Pain improves with: therapy/exercise and TENS Relief from Meds: 3  Mobility walk without assistance how many minutes can you walk? 5 ability to climb steps?  yes do you drive?  yes  Function disabled: date disabled 12/2013 I need assistance with the following:  meal prep  Neuro/Psych No problems in this area  Prior Studies Any changes since last visit?  no  Physicians involved in your care Any changes since last visit?  no   Family History  Problem Relation Age of Onset  . Hypertension Mother    Social History   Social History  . Marital status: Married    Spouse name: N/A  . Number of children: N/A  . Years of education: N/A   Social History Main Topics  . Smoking status: Never Smoker  . Smokeless tobacco: None  . Alcohol use No  . Drug use: No  . Sexual activity: No   Other Topics Concern  . None   Social History Narrative  . None   Past Surgical History:  Procedure Laterality Date  . KNEE ARTHROSCOPY Right    Past Medical History:  Diagnosis Date  . Hyperlipidemia   . Hypertension   . Stroke (HCC)    BP 123/81 (BP Location: Left Arm, Patient Position: Sitting, Cuff Size: Normal)   Pulse 89   Resp 16   SpO2 97%   Opioid Risk Score:   Fall Risk Score:  `1  Depression screen PHQ 2/9  [facility] 01/12/2016 11/18/2015 10/06/2015 08/26/2015 07/21/2015 02/18/2015 02/18/2015  Decreased Interest 0 0 0 0 0 0 0  Down,  Depressed, Hopeless 0 0 - 0 0 0 0  PHQ - 2 Score 0 0 0 0 0 0 0  Altered sleeping - - - - - - -  Tired, decreased energy - - - - - - -  Change in appetite - - - - - - -  Feeling bad or failure about yourself  - - - - - - -  Trouble concentrating - - - - - - -  Moving slowly or fidgety/restless - - - - - - -  Suicidal thoughts - - - - - - -  PHQ-9 Score - - - - - - -   Review of Systems  All other systems reviewed and are negative.      Objective:   Physical Exam Trace Right thumb flex Trace Right biceps 0/5 triceps  [place] 2 in wrist and finger flexor Sensation intact     Assessment & Plan:  1.  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

## 2016-01-21 ENCOUNTER — Other Ambulatory Visit: Payer: Self-pay

## 2016-01-21 DIAGNOSIS — I10 Essential (primary) hypertension: Secondary | ICD-10-CM

## 2016-01-21 MED ORDER — CHLORTHALIDONE 50 MG PO TABS: 50.0000 mg | ORAL_TABLET | Freq: Every day | ORAL | 1 refills | Status: DC

## 2016-01-29 ENCOUNTER — Other Ambulatory Visit: Payer: Self-pay

## 2016-01-29 MED ORDER — SIMVASTATIN 40 MG PO TABS: 40.0000 mg | ORAL_TABLET | Freq: Every day | ORAL | 0 refills | Status: DC

## 2016-01-29 NOTE — Telephone Encounter (Signed)
Please ask patient to get his cholesterol and liver enzyme checked; we really hoped this would be checked within 6-8 weeks after he started the higher strength We need to make sure the medicine isn't hurting his liver and that it's actually strong enough; I'll give a limited amount to give him time to come in, but please do get the fasting labs done in the next five days; thank you

## 2016-01-30 NOTE — Telephone Encounter (Signed)
Left detailed message.   

## 2016-02-02 ENCOUNTER — Other Ambulatory Visit: Payer: BLUE CROSS/BLUE SHIELD

## 2016-02-02 ENCOUNTER — Other Ambulatory Visit: Payer: Self-pay

## 2016-02-02 DIAGNOSIS — Z5181 Encounter for therapeutic drug level monitoring: Secondary | ICD-10-CM

## 2016-02-02 DIAGNOSIS — E785 Hyperlipidemia, unspecified: Secondary | ICD-10-CM

## 2016-02-02 LAB — LIPID PANEL
CHOL/HDL RATIO: 3.2 ratio (ref <=5.0)
Cholesterol: 176 mg/dL (ref 125–200)
HDL: 55 mg/dL (ref >=40)
LDL CALC: 112 mg/dL (ref <130)
Triglycerides: 45 mg/dL (ref <150)
VLDL: 9 mg/dL (ref <30)

## 2016-02-02 LAB — ALT: ALT: 25 U/L (ref 9–46)

## 2016-02-05 ENCOUNTER — Telehealth: Payer: Self-pay | Admitting: Family Medicine

## 2016-02-06 ENCOUNTER — Other Ambulatory Visit: Payer: Self-pay

## 2016-02-06 DIAGNOSIS — E785 Hyperlipidemia, unspecified: Secondary | ICD-10-CM

## 2016-02-06 DIAGNOSIS — Z5181 Encounter for therapeutic drug level monitoring: Secondary | ICD-10-CM

## 2016-02-06 MED ORDER — ATORVASTATIN CALCIUM 80 MG PO TABS: 80.0000 mg | ORAL_TABLET | Freq: Every day | ORAL | 1 refills | Status: DC

## 2016-02-06 NOTE — Assessment & Plan Note (Signed)
Check sgpt 6-8 weeks after change in statin

## 2016-02-06 NOTE — Telephone Encounter (Signed)
Pt called got labs done, please review and fill rx for 90 day supply.

## 2016-02-06 NOTE — Telephone Encounter (Signed)
I called, left detailed message for patient Appreciate him getting labs done Helps me to direct therapy We are NOT at goal, LDL needs to be under 70 I'm switching cholesterol medicines Recheck fasting labs 6-8 weeks after starting new medicine Try to limit bacon, sausage, cheese, hot dogs, etc

## 2016-02-06 NOTE — Assessment & Plan Note (Signed)
Change to atorvastatin 80; recheck lipids 6-8 weeks later

## 2016-02-09 ENCOUNTER — Telehealth: Payer: Self-pay | Admitting: Family Medicine

## 2016-02-10 MED ORDER — PRAVASTATIN SODIUM 80 MG PO TABS
80.0000 mg | ORAL_TABLET | Freq: Every day | ORAL | 1 refills | Status: DC
Start: 2016-02-10 — End: 2016-02-17

## 2016-02-10 NOTE — Telephone Encounter (Signed)
The 40 mg of simvastatin was not strong enough His LDL was still too high on that dose We need to go stronger We no longer use simvastatin 80 mg, which is why I switched him to atorvastatin (Lipitor) 80 mg I think the best I can do is pravastatin 80 mg which doesn't work as well as atorvastatin If he wants to try that, I can send in a prescription, but I can't promise it will be as effective -- but we can try CANCEL the atorvastatin Check labs in 6-8 weeks on new medicine (lipids and sgpt), thanks for putting those orders in

## 2016-02-11 NOTE — Telephone Encounter (Signed)
Pt.notified

## 2016-02-12 NOTE — Telephone Encounter (Signed)
Pt notified of labs

## 2016-02-16 NOTE — Telephone Encounter (Signed)
Patient states that atorvastatin is the same price as the simvastatin and he is not able to afford it. Would like to know what you think about the simvastatin 40mg ? Can he get it in a 90day supply and take 2 pills daily? Would like to know if his cholesterol is in critical condition? Please advise.

## 2016-02-16 NOTE — Telephone Encounter (Signed)
Patient cannot afford 30 day of pravastatin it cost $38, but can with 90 day supply it will only cost $25, please write new rx

## 2016-02-17 ENCOUNTER — Other Ambulatory Visit: Payer: Self-pay

## 2016-02-17 MED ORDER — PRAVASTATIN SODIUM 80 MG PO TABS: 80.0000 mg | ORAL_TABLET | Freq: Every day | ORAL | 0 refills | Status: DC

## 2016-02-17 NOTE — Telephone Encounter (Signed)
Please write new rx for 90 day its cheaper and he can afford, he cannot afford 30 day

## 2016-02-17 NOTE — Telephone Encounter (Signed)
I sent 90 day Rx

## 2016-03-08 ENCOUNTER — Ambulatory Visit (HOSPITAL_BASED_OUTPATIENT_CLINIC_OR_DEPARTMENT_OTHER): Payer: BLUE CROSS/BLUE SHIELD | Admitting: Physical Medicine & Rehabilitation

## 2016-03-08 ENCOUNTER — Other Ambulatory Visit: Payer: Self-pay

## 2016-03-08 ENCOUNTER — Encounter: Payer: Self-pay | Admitting: Physical Medicine & Rehabilitation

## 2016-03-08 ENCOUNTER — Encounter: Payer: BLUE CROSS/BLUE SHIELD | Attending: Physical Medicine & Rehabilitation

## 2016-03-08 VITALS — BP 126/75 | HR 77

## 2016-03-08 DIAGNOSIS — M25511 Pain in right shoulder: Secondary | ICD-10-CM | POA: Diagnosis not present

## 2016-03-08 DIAGNOSIS — I10 Essential (primary) hypertension: Secondary | ICD-10-CM

## 2016-03-08 DIAGNOSIS — Z7982 Long term (current) use of aspirin: Secondary | ICD-10-CM | POA: Diagnosis not present

## 2016-03-08 DIAGNOSIS — G8110 Spastic hemiplegia affecting unspecified side: Secondary | ICD-10-CM

## 2016-03-08 DIAGNOSIS — I69351 Hemiplegia and hemiparesis following cerebral infarction affecting right dominant side: Secondary | ICD-10-CM | POA: Insufficient documentation

## 2016-03-08 NOTE — Progress Notes (Signed)
Botox Injection for spasticity using needle EMG guidance  Dilution: 50 Units/ml Indication: Severe spasticity which interferes with ADL,mobility and/or  hygiene and is unresponsive to medication management and other conservative care Informed consent was obtained after describing risks and benefits of the procedure with the patient. This includes bleeding, bruising, infection, excessive weakness, or medication side effects. A REMS form is on file and signed. Needle: 50mm 25g needle electrode Number of units per muscle Biceps125 Brachialis 125 FCR25 FCU0 FDS0 FDP25 PT50 Brachioradialis 50 All injections were done after obtaining appropriate EMG activity and after negative drawback for blood. The patient tolerated the procedure well. Post procedure instructions were given. A followup appointment was made.

## 2016-03-08 NOTE — Patient Instructions (Signed)

## 2016-03-10 ENCOUNTER — Other Ambulatory Visit: Payer: Self-pay

## 2016-03-10 DIAGNOSIS — I10 Essential (primary) hypertension: Secondary | ICD-10-CM

## 2016-03-10 MED ORDER — ASPIRIN EC 325 MG PO TBEC: 325.0000 mg | DELAYED_RELEASE_TABLET | Freq: Every day | ORAL | 3 refills | Status: DC

## 2016-03-10 NOTE — Telephone Encounter (Signed)
90 day supply

## 2016-04-19 ENCOUNTER — Other Ambulatory Visit: Payer: Self-pay

## 2016-04-19 ENCOUNTER — Encounter: Payer: Self-pay | Admitting: Physical Medicine & Rehabilitation

## 2016-04-19 ENCOUNTER — Ambulatory Visit (HOSPITAL_BASED_OUTPATIENT_CLINIC_OR_DEPARTMENT_OTHER): Payer: BLUE CROSS/BLUE SHIELD | Admitting: Physical Medicine & Rehabilitation

## 2016-04-19 ENCOUNTER — Encounter: Payer: BLUE CROSS/BLUE SHIELD | Attending: Physical Medicine & Rehabilitation

## 2016-04-19 VITALS — BP 126/78 | HR 83 | Resp 14

## 2016-04-19 DIAGNOSIS — M25511 Pain in right shoulder: Secondary | ICD-10-CM | POA: Diagnosis not present

## 2016-04-19 DIAGNOSIS — I69351 Hemiplegia and hemiparesis following cerebral infarction affecting right dominant side: Secondary | ICD-10-CM | POA: Insufficient documentation

## 2016-04-19 DIAGNOSIS — E785 Hyperlipidemia, unspecified: Secondary | ICD-10-CM

## 2016-04-19 DIAGNOSIS — Z5181 Encounter for therapeutic drug level monitoring: Secondary | ICD-10-CM

## 2016-04-19 DIAGNOSIS — Z7982 Long term (current) use of aspirin: Secondary | ICD-10-CM | POA: Insufficient documentation

## 2016-04-19 DIAGNOSIS — I10 Essential (primary) hypertension: Secondary | ICD-10-CM

## 2016-04-19 DIAGNOSIS — G8110 Spastic hemiplegia affecting unspecified side: Secondary | ICD-10-CM

## 2016-04-19 MED ORDER — LISINOPRIL 40 MG PO TABS: 40.0000 mg | ORAL_TABLET | Freq: Every day | ORAL | 0 refills | Status: DC

## 2016-04-19 NOTE — Telephone Encounter (Signed)
Called Pt and notified him of his refill. Mention to pt he has labs that was due in September. He stated he will come in tomorrow and have them done.

## 2016-04-19 NOTE — Telephone Encounter (Signed)
Please remind patient that we had hoped to check his cholesterol in late October; please have fasting labs done as soon as he can I sent the lisinopril as requested

## 2016-04-19 NOTE — Patient Instructions (Signed)
Will try Dysport

## 2016-04-19 NOTE — Progress Notes (Signed)
Subjective:    Patient ID: [name], male    DOB: 03/13/1956, 60 y.o.   MRN: [id number]  HPI  Patient feels like the last Botox injection, 10/16, 2017 was helpful, but still feels tight at the shoulder and the fingers. We discussed that he maxed out on the Botox and given the amount used for his elbow flexors, could not put additional medication in the finger flexors or the pectoralis area. We discussed his options, including Dysport  Right hand dominant Right arm used as gross assist No Right arm pain Using stationary bike Cholesterol elevated , statin increased   Pain Inventory Average Pain 1 Pain Right Now 1 My pain is intermittent and aching  In the last 24 hours, has pain interfered with the following? General activity 0 Relation with others 0 Enjoyment of life 0 What TIME of day is your pain at its worst? night Sleep (in general) NA  Pain is worse with: inactivity Pain improves with: therapy/exercise and TENS Relief from Meds: 5  Mobility how many minutes can you walk? 5 ability to climb steps?  yes do you drive?  yes  Function disabled: date disabled . I need assistance with the following:  meal prep  Neuro/Psych No problems in this area  Prior Studies Any changes since last visit?  no  Physicians involved in your care Any changes since last visit?  no   Family History  Problem Relation Age of Onset  . Hypertension Mother    Social History   Social History  . Marital status: Married    Spouse name: N/A  . Number of children: N/A  . Years of education: N/A   Social History Main Topics  . Smoking status: Never Smoker  . Smokeless tobacco: Never Used  . Alcohol use No  . Drug use: No  . Sexual activity: No   Other Topics Concern  . None   Social History Narrative  . None   Past Surgical History:  Procedure Laterality Date  . KNEE ARTHROSCOPY Right    Past Medical History:  Diagnosis Date  . Hyperlipidemia   . Hypertension    . Stroke (HCC)    BP 126/78   Pulse 83   Resp 14   SpO2 98%   Opioid Risk Score:   Fall Risk Score:  `1  Depression screen PHQ 2/9  [facility] 04/19/2016 01/12/2016 11/18/2015 10/06/2015 08/26/2015 07/21/2015 02/18/2015  Decreased Interest 0 0 0 0 0 0 0  Down, Depressed, Hopeless 0 0 0 - 0 0 0  PHQ - 2 Score 0 0 0 0 0 0 0  Altered sleeping - - - - - - -  Tired, decreased energy - - - - - - -  Change in appetite - - - - - - -  Feeling bad or failure about yourself  - - - - - - -  Trouble concentrating - - - - - - -  Moving slowly or fidgety/restless - - - - - - -  Suicidal thoughts - - - - - - -  PHQ-9 Score - - - - - - -    Review of Systems  Constitutional: Negative.   HENT: Negative.   Eyes: Negative.   Respiratory: Negative.   Gastrointestinal: Negative.   Endocrine: Negative.   Genitourinary: Negative.   Musculoskeletal: Negative.   Allergic/Immunologic: Negative.   Neurological: Negative.   Hematological: Negative.   Psychiatric/Behavioral: Negative.   All other systems reviewed and are  negative.      Objective:   Physical Exam  Constitutional: He is oriented to person, place, and time. He appears well-developed and well-nourished.  HENT:  Head: Normocephalic and atraumatic.  Eyes: Conjunctivae and EOM are normal. Pupils are equal, round, and reactive to light.  Neurological: He is alert and oriented to person, place, and time.  Psychiatric: He has a normal mood and affect.  Nursing note and vitals reviewed.  3- Right delt MAS Pec 3 BICEPS 3 FINGER FLEXORS 2-3       Assessment & Plan:   Last Botox injection Biceps125 Brachialis 125 FCR25 FCU0 FDS0 FDP25 PT50 Brachioradialis 50  Plan Dysport Biceps 300 Brachialis, 300 FCR 100 FDS 100 FDP 100 Pronator, 100 Brachial radialis, 100 Pectoralis 400

## 2016-04-20 LAB — LIPID PANEL
CHOLESTEROL: 185 mg/dL (ref <200)
HDL: 50 mg/dL (ref >40)
LDL Cholesterol: 112 mg/dL — ABNORMAL HIGH (ref <100)
TRIGLYCERIDES: 113 mg/dL (ref <150)
Total CHOL/HDL Ratio: 3.7 Ratio (ref <5.0)
VLDL: 23 mg/dL (ref <30)

## 2016-04-20 LAB — ALT: ALT: 26 U/L (ref 9–46)

## 2016-04-27 ENCOUNTER — Other Ambulatory Visit: Payer: Self-pay | Admitting: Family Medicine

## 2016-04-27 DIAGNOSIS — I6523 Occlusion and stenosis of bilateral carotid arteries: Secondary | ICD-10-CM

## 2016-04-27 DIAGNOSIS — Z5181 Encounter for therapeutic drug level monitoring: Secondary | ICD-10-CM

## 2016-04-27 MED ORDER — ATORVASTATIN CALCIUM 80 MG PO TABS: 80.0000 mg | ORAL_TABLET | Freq: Every day | ORAL | 1 refills | Status: DC

## 2016-04-27 NOTE — Assessment & Plan Note (Signed)
Check sgpt on new statin

## 2016-04-27 NOTE — Assessment & Plan Note (Signed)
Pravastatin 80 mg not effective enough; switch to atorvastatin 80 mg; recheck lipids in 6 weeks

## 2016-04-27 NOTE — Progress Notes (Signed)
Switch statin; check labs around 06/09/16

## 2016-05-10 ENCOUNTER — Emergency Department: Payer: BLUE CROSS/BLUE SHIELD

## 2016-05-10 ENCOUNTER — Encounter: Payer: Self-pay | Admitting: Emergency Medicine

## 2016-05-10 ENCOUNTER — Inpatient Hospital Stay
Admission: EM | Admit: 2016-05-10 | Discharge: 2016-05-15 | DRG: 871 | Disposition: A | Discharge status: Home / Self Care | Payer: BLUE CROSS/BLUE SHIELD | Attending: Internal Medicine | Admitting: Internal Medicine

## 2016-05-10 DIAGNOSIS — R739 Hyperglycemia, unspecified: Secondary | ICD-10-CM | POA: Diagnosis present

## 2016-05-10 DIAGNOSIS — N179 Acute kidney failure, unspecified: Secondary | ICD-10-CM | POA: Diagnosis present

## 2016-05-10 DIAGNOSIS — D649 Anemia, unspecified: Secondary | ICD-10-CM | POA: Diagnosis present

## 2016-05-10 DIAGNOSIS — K56609 Unspecified intestinal obstruction, unspecified as to partial versus complete obstruction: Secondary | ICD-10-CM | POA: Diagnosis present

## 2016-05-10 DIAGNOSIS — I69351 Hemiplegia and hemiparesis following cerebral infarction affecting right dominant side: Secondary | ICD-10-CM | POA: Diagnosis not present

## 2016-05-10 DIAGNOSIS — I10 Essential (primary) hypertension: Secondary | ICD-10-CM | POA: Diagnosis present

## 2016-05-10 DIAGNOSIS — E872 Acidosis: Secondary | ICD-10-CM | POA: Diagnosis present

## 2016-05-10 DIAGNOSIS — N281 Cyst of kidney, acquired: Secondary | ICD-10-CM | POA: Diagnosis present

## 2016-05-10 DIAGNOSIS — I471 Supraventricular tachycardia: Secondary | ICD-10-CM | POA: Diagnosis not present

## 2016-05-10 DIAGNOSIS — Z79899 Other long term (current) drug therapy: Secondary | ICD-10-CM | POA: Diagnosis not present

## 2016-05-10 DIAGNOSIS — K4020 Bilateral inguinal hernia, without obstruction or gangrene, not specified as recurrent: Secondary | ICD-10-CM | POA: Diagnosis present

## 2016-05-10 DIAGNOSIS — Z452 Encounter for adjustment and management of vascular access device: Secondary | ICD-10-CM

## 2016-05-10 DIAGNOSIS — A419 Sepsis, unspecified organism: Principal | ICD-10-CM | POA: Diagnosis present

## 2016-05-10 DIAGNOSIS — R6521 Severe sepsis with septic shock: Secondary | ICD-10-CM | POA: Diagnosis present

## 2016-05-10 DIAGNOSIS — E876 Hypokalemia: Secondary | ICD-10-CM

## 2016-05-10 DIAGNOSIS — E785 Hyperlipidemia, unspecified: Secondary | ICD-10-CM | POA: Diagnosis present

## 2016-05-10 DIAGNOSIS — E861 Hypovolemia: Secondary | ICD-10-CM | POA: Diagnosis present

## 2016-05-10 DIAGNOSIS — Z8673 Personal history of transient ischemic attack (TIA), and cerebral infarction without residual deficits: Secondary | ICD-10-CM | POA: Diagnosis not present

## 2016-05-10 DIAGNOSIS — Z7982 Long term (current) use of aspirin: Secondary | ICD-10-CM | POA: Diagnosis not present

## 2016-05-10 DIAGNOSIS — K56600 Partial intestinal obstruction, unspecified as to cause: Secondary | ICD-10-CM | POA: Diagnosis present

## 2016-05-10 DIAGNOSIS — I959 Hypotension, unspecified: Secondary | ICD-10-CM

## 2016-05-10 LAB — COMPREHENSIVE METABOLIC PANEL
ALBUMIN: 4.2 g/dL (ref 3.5–5.0)
ALK PHOS: 57 U/L (ref 38–126)
ALT: 28 U/L (ref 17–63)
AST: 35 U/L (ref 15–41)
Anion gap: 14 (ref 5–15)
BILIRUBIN TOTAL: 0.7 mg/dL (ref 0.3–1.2)
BUN: 19 mg/dL (ref 6–20)
CALCIUM: 9.7 mg/dL (ref 8.9–10.3)
CO2: 23 mmol/L (ref 22–32)
CREATININE: 2.09 mg/dL — AB (ref 0.61–1.24)
Chloride: 96 mmol/L — ABNORMAL LOW (ref 101–111)
GFR calc Af Amer: 38 mL/min — ABNORMAL LOW (ref >60)
GFR calc non Af Amer: 33 mL/min — ABNORMAL LOW (ref >60)
GLUCOSE: 233 mg/dL — AB (ref 65–99)
Potassium: 2.6 mmol/L — CL (ref 3.5–5.1)
SODIUM: 133 mmol/L — AB (ref 135–145)
TOTAL PROTEIN: 7.8 g/dL (ref 6.5–8.1)

## 2016-05-10 LAB — CBC WITH DIFFERENTIAL/PLATELET
BASOS ABS: 0 10*3/uL (ref 0–0.1)
BASOS PCT: 0 %
EOS PCT: 0 %
Eosinophils Absolute: 0 10*3/uL (ref 0–0.7)
HCT: 38.6 % — ABNORMAL LOW (ref 40.0–52.0)
Hemoglobin: 12.7 g/dL — ABNORMAL LOW (ref 13.0–18.0)
LYMPHS PCT: 8 %
Lymphs Abs: 0.9 10*3/uL — ABNORMAL LOW (ref 1.0–3.6)
MCH: 28.1 pg (ref 26.0–34.0)
MCHC: 32.9 g/dL (ref 32.0–36.0)
MCV: 85.6 fL (ref 80.0–100.0)
MONO ABS: 0.1 10*3/uL — AB (ref 0.2–1.0)
Monocytes Relative: 1 %
Neutro Abs: 9.5 10*3/uL — ABNORMAL HIGH (ref 1.4–6.5)
Neutrophils Relative %: 91 %
PLATELETS: 286 10*3/uL (ref 150–440)
RBC: 4.5 MIL/uL (ref 4.40–5.90)
RDW: 14 % (ref 11.5–14.5)
WBC: 10.5 10*3/uL (ref 3.8–10.6)

## 2016-05-10 LAB — CBC
HEMATOCRIT: 15.7 % — AB (ref 40.0–52.0)
Hemoglobin: 5.1 g/dL — ABNORMAL LOW (ref 13.0–18.0)
MCH: 28.3 pg (ref 26.0–34.0)
MCHC: 32.3 g/dL (ref 32.0–36.0)
MCV: 87.5 fL (ref 80.0–100.0)
Platelets: 129 10*3/uL — ABNORMAL LOW (ref 150–440)
RBC: 1.79 MIL/uL — AB (ref 4.40–5.90)
RDW: 13.8 % (ref 11.5–14.5)
WBC: 4.4 10*3/uL (ref 3.8–10.6)

## 2016-05-10 LAB — PROTIME-INR
INR: 1.01
Prothrombin Time: 13.3 seconds (ref 11.4–15.2)

## 2016-05-10 LAB — URINALYSIS, COMPLETE (UACMP) WITH MICROSCOPIC
BACTERIA UA: NONE SEEN
Bilirubin Urine: NEGATIVE
Glucose, UA: NEGATIVE mg/dL
HGB URINE DIPSTICK: NEGATIVE
Ketones, ur: NEGATIVE mg/dL
NITRITE: NEGATIVE
PROTEIN: NEGATIVE mg/dL
SPECIFIC GRAVITY, URINE: 1.014 (ref 1.005–1.030)
pH: 6 (ref 5.0–8.0)

## 2016-05-10 LAB — ABO/RH: ABO/RH(D): A POS

## 2016-05-10 LAB — CK: Total CK: 366 U/L (ref 49–397)

## 2016-05-10 LAB — TROPONIN I: Troponin I: <0.03 ng/mL (ref <0.03)

## 2016-05-10 LAB — PREPARE RBC (CROSSMATCH)

## 2016-05-10 LAB — LACTIC ACID, PLASMA: Lactic Acid, Venous: 5.8 mmol/L (ref 0.5–1.9)

## 2016-05-10 LAB — ETHANOL: Alcohol, Ethyl (B): <5 mg/dL (ref <5)

## 2016-05-10 LAB — GLUCOSE, CAPILLARY: GLUCOSE-CAPILLARY: 106 mg/dL — AB (ref 65–99)

## 2016-05-10 LAB — TSH: TSH: 11.396 u[IU]/mL — AB (ref 0.350–4.500)

## 2016-05-10 MED ORDER — POTASSIUM CHLORIDE IN NACL 40-0.9 MEQ/L-% IV SOLN
INTRAVENOUS | Status: AC
  Administered 2016-05-10: 1000 mL via INTRAVENOUS
  Filled 2016-05-10: qty 1000

## 2016-05-10 MED ORDER — SODIUM CHLORIDE 0.9 % IV BOLUS (SEPSIS)
1000.0000 mL | Freq: Once | INTRAVENOUS | Status: AC
  Administered 2016-05-10: 1000 mL via INTRAVENOUS

## 2016-05-10 MED ORDER — PIPERACILLIN-TAZOBACTAM 3.375 G IVPB 30 MIN
3.3750 g | Freq: Once | INTRAVENOUS | Status: AC
  Administered 2016-05-10: 3.375 g via INTRAVENOUS
  Filled 2016-05-10: qty 50

## 2016-05-10 MED ORDER — VANCOMYCIN HCL IN DEXTROSE 1-5 GM/200ML-% IV SOLN
1000.0000 mg | Freq: Once | INTRAVENOUS | Status: AC
  Administered 2016-05-10: 1000 mg via INTRAVENOUS
  Filled 2016-05-10: qty 200

## 2016-05-10 MED ORDER — SODIUM CHLORIDE 0.9 % IV SOLN
10.0000 mL/h | Freq: Once | INTRAVENOUS | Status: AC
  Administered 2016-05-11: 10 mL/h via INTRAVENOUS

## 2016-05-10 MED ORDER — ONDANSETRON HCL 4 MG/2ML IJ SOLN
4.0000 mg | Freq: Four times a day (QID) | INTRAMUSCULAR | Status: DC | PRN
  Administered 2016-05-14 (×2): 4 mg via INTRAVENOUS
  Filled 2016-05-10 (×2): qty 2

## 2016-05-10 MED ORDER — SODIUM CHLORIDE 0.9 % IV SOLN: 250.0000 mL | INTRAVENOUS | Status: DC | PRN

## 2016-05-10 MED ORDER — POTASSIUM CHLORIDE IN NACL 40-0.9 MEQ/L-% IV SOLN
INTRAVENOUS | Status: DC
  Administered 2016-05-10: 1000 mL via INTRAVENOUS
  Filled 2016-05-10 (×17): qty 1000

## 2016-05-10 MED ORDER — HEPARIN SODIUM (PORCINE) 5000 UNIT/ML IJ SOLN: 5000.0000 [IU] | Freq: Three times a day (TID) | INTRAMUSCULAR | Status: DC

## 2016-05-10 MED ORDER — ACETAMINOPHEN 325 MG PO TABS
650.0000 mg | ORAL_TABLET | ORAL | Status: DC | PRN
  Administered 2016-05-13: 650 mg via ORAL
  Filled 2016-05-10: qty 2

## 2016-05-10 MED ORDER — MORPHINE SULFATE (PF) 4 MG/ML IV SOLN: 1.0000 mg | INTRAVENOUS | Status: DC | PRN

## 2016-05-10 MED ORDER — IPRATROPIUM-ALBUTEROL 0.5-2.5 (3) MG/3ML IN SOLN: 3.0000 mL | Freq: Four times a day (QID) | RESPIRATORY_TRACT | Status: DC | PRN

## 2016-05-10 NOTE — ED Notes (Signed)
Pt produced BM x1. Pt sts pain decreased afterwards from 10/10 to 2/10 on pain scale. MD made aware.

## 2016-05-10 NOTE — Consult Note (Signed)
Patient ID: [name], male   DOB: 05/13/1956, 60 y.o.   MRN: [id number]  HPI [name] is a 60 y.o. male asked by Dr. Alphonzo Chan to see in consultation for bilateral inguinal hernias. Per the patient he was in his usual state of health and his afternoon after driving felt dizzy, nauseated,  and vomited. He reports some abdominal sickness but no true abdominal pain. He states he did pass gas and he was adamant about having any hematemesis or hematochezia or melena. He did report some diaphoresis. He reports that he has had an inguinal hernias for several years and is unsure whether they were reducible or not asked. He is certain that on the left side this was reducible. He does have a history of a stroke, and has some right hemiparesis but he is very functional. I discussed with him and his wife and apparently is able to walk slowly but he does all the ADLs without any assistance. He is able to mow his yard and does everything independently. He is also able to drive without issues The pertinent thing is that he was increase his statin dose a few weeks ago by his primary care provider. CT scan personally reviewed and  w radiologist. Large BIH w some dilation of SB. No free air, no evidence of true strangulation, no evidence of mural thickening to suggest ischemia.   HPI  Past Medical History:  Diagnosis Date  . Hyperlipidemia   . Hypertension   . Stroke [facility](HCC)     Past Surgical History:  Procedure Laterality Date  . KNEE ARTHROSCOPY Right     Family History  Problem Relation Age of Onset  . Hypertension Mother     Social History Social History  Substance Use Topics  . Smoking status: Never Smoker  . Smokeless tobacco: Never Used  . Alcohol use No    No Known Allergies  Current Facility-Administered Medications  Medication Dose Route Frequency Provider Last Rate Last Dose  . 0.9 %  sodium chloride infusion  10 mL/hr Intravenous Once Thomas PlantJames A McShane, MD      . 0.9 % NaCl  with KCl 40 mEq / L  infusion   Intravenous Continuous Thomas PlantJames A McShane, MD   1,000 mL at 05/10/16 2025   Current Outpatient Prescriptions  Medication Sig Dispense Refill  . aspirin EC 325 MG tablet Take 1 tablet (325 mg total) by mouth daily. 90 tablet 3  . atorvastatin (LIPITOR) 80 MG tablet Take 1 tablet (80 mg total) by mouth daily. (new cholesterol medicine, stop the pravastatin) 30 tablet 1  . chlorthalidone (HYGROTON) 50 MG tablet Take 1 tablet (50 mg total) by mouth daily. 90 tablet 1  . lisinopril (PRINIVIL,ZESTRIL) 40 MG tablet Take 1 tablet (40 mg total) by mouth daily. 90 tablet 0  . meloxicam (MOBIC) 15 MG tablet Take 1 tablet (15 mg total) by mouth daily as needed for pain. 30 tablet 1  . Multiple Vitamins-Minerals (ONE-A-DAY MENS HEALTH FORMULA) TABS Take 1 tablet by mouth daily.    . Omega-3 Fatty Acids (FISH OIL) 1000 MG CAPS Take 1,000 mg by mouth daily.    . Polyvinyl Alcohol-Povidone (MURINE TEARS FOR DRY EYES OP) Place 1 drop into both eyes 3 (three) times a week.       Review of Systems A 10 point review of systems was asked and was negative except for the information on the HPI  Physical Exam Blood pressure 97/86, pulse 94, temperature 98.3 F (36.8 C), temperature  source Rectal, resp. rate (!) 25, weight 97.5 kg (215 lb), SpO2 100 %. CONSTITUTIONAL: Pt is  EYES: Pupils are equal, round, and reactive to light, Sclera are non-icteric. EARS, NOSE, MOUTH AND THROAT: The oropharynx is clear. The oral mucosa is pink and moist. Hearing is intact to voice. LYMPH NODES:  Lymph nodes in the neck are normal. RESPIRATORY:  Lungs are clear. There is normal respiratory effort, with equal breath sounds bilaterally, and without pathologic use of accessory muscles. CARDIOVASCULAR: Heart is regular without murmurs, gallops, or rubs. GI: The abdomen is rigid from baseline spasticity ( not related to intra-abdominal pathology) non tender , no peritonitis. BIH, left reducible right  chronically incarcerated, non tender. Unable to be reduced GU: Rectal deferred.   MUSCULOSKELETAL: Normal muscle strength and tone. No cyanosis or edema.   SKIN: Turgor is good and there are no pathologic skin lesions or ulcers. 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 Some paresia on the right upper and lower extremities PSYCH:  Oriented to person, place and time. Affect is normal.  Data Reviewed  I have personally reviewed the patient's imaging, laboratory findings and medical records.    Assessment/Plan Chronically incarcerated right inguinal hernia and reducible left inguinal hernia. This does not explain his overall hypotension, anemia, acute kidney injury and deterioration. He will definitely need to be admitted to the ICU for fluid resuscitation, blood transfusion and further workup. It is unlikely that the patient will require emergency surgery but will defitely follow along just in case there is some intra-abdominal pathology that may  need intervention. Discussed in detail with Dr. Alphonzo Chan from the emergency room and medicine is to admit the patient. If he were to have an abdominal operation he will need to be optimized anyway from the medical  perspective regarding his potassium, volume status and his hemoglobin. Extensive counseling provided to the family. No need for emergent surgical intervention at this time Thomas Bigiego Zarek Relph, MD FACS General Surgeon 05/10/2016, 8:53 PM

## 2016-05-10 NOTE — H&P (Signed)
PULMONARY / CRITICAL CARE MEDICINE   Name: [name] MRN: [id number] DOB: 06/24/1955    ADMISSION DATE:  05/10/2016 CONSULTATION DATE:  05/10/2016  REFERRING MD:  Dr. 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  [facility] ER 12/18 with c/o severe lower abdominal cramping onset 17:00 today 12/18.  Per ER notes he began vomiting at approximately 18:00 and according to family he became ashen, pale, and diaphoretic prompting visit to ER.  He does have a scrotal hernia and according to the pt it has been there for a few months, however it has not caused any problems up until now.  He also states he has had bilateral inguinal hernias for several years.  He endorses having regular bowel movements most recently today 12/18 and denies any s/sx of bleeding.  Per Surgeon Dr. Hurman HornPabon's note CT Abdomen Pelvis revealed large BIH with some dilation of small bowel without evidence of free air, no evidence of true strangulation, no evidence of mural thickening to suggest ischemia.  In the ER the pt was hypotensive with an elevated lactic acid.  PCCM contacted to admit pt to ICU due to small bowel obstruction and hypotension secondary to septic shock.   PAST MEDICAL HISTORY :  He  has a past medical history of Hyperlipidemia; Hypertension; and Stroke [facility](HCC).  PAST SURGICAL HISTORY: He  has a past surgical history that includes Knee arthroscopy (Right).  No Known Allergies  No current facility-administered medications on file prior to encounter.    Current Outpatient Prescriptions on File Prior to Encounter  Medication Sig  . aspirin EC 325 MG tablet Take 1 tablet (325 mg total) by mouth daily.  Marland Kitchen. atorvastatin (LIPITOR) 80 MG tablet Take 1 tablet (80 mg total) by mouth daily. (new cholesterol medicine, stop the pravastatin)  .  chlorthalidone (HYGROTON) 50 MG tablet Take 1 tablet (50 mg total) by mouth daily.  Marland Kitchen. lisinopril (PRINIVIL,ZESTRIL) 40 MG tablet Take 1 tablet (40 mg total) by mouth daily.  . meloxicam (MOBIC) 15 MG tablet Take 1 tablet (15 mg total) by mouth daily as needed for pain.  . Multiple Vitamins-Minerals (ONE-A-DAY MENS HEALTH FORMULA) TABS Take 1 tablet by mouth daily.  . Omega-3 Fatty Acids (FISH OIL) 1000 MG CAPS Take 1,000 mg by mouth daily.  . Polyvinyl Alcohol-Povidone (MURINE TEARS FOR DRY EYES OP) Place 1 drop into both eyes 3 (three) times a week.    FAMILY HISTORY:  His indicated that the status of his mother is unknown.    SOCIAL HISTORY: He  reports that he has never smoked. He has never used smokeless tobacco. He reports that he does not drink alcohol or use drugs.  REVIEW OF SYSTEMS:  Positives in BOLD  Gen: Denies fever, chills, weight change, fatigue, night sweats HEENT: Denies blurred vision, double vision, hearing loss, tinnitus, sinus congestion, rhinorrhea, sore throat, neck stiffness, dysphagia PULM: Denies shortness of breath, cough, sputum production, hemoptysis, wheezing CV: Denies chest pain, edema, orthopnea, paroxysmal nocturnal dyspnea, palpitations GI: abdominal pain, nausea, vomiting, diarrhea, hematochezia, melena, constipation, change in bowel habits GU: Denies dysuria, hematuria, polyuria, oliguria, urethral discharge Endocrine: Denies hot or cold intolerance, polyuria, polyphagia or appetite change Derm: Denies rash, dry skin, scaling or peeling skin change Heme: Denies easy bruising, bleeding, bleeding gums Neuro: Denies headache, numbness,  weakness, slurred speech, loss of memory or consciousness  SUBJECTIVE:  c/o mild abdominal pain no other complaints at this time  VITAL SIGNS: BP 101/68   Pulse 97   Temp 98.3 F (36.8 C) (Rectal)   Resp 14   Wt 215 lb (97.5 kg)   SpO2 100%   BMI 29.99 kg/m   HEMODYNAMICS:    VENTILATOR SETTINGS:     INTAKE / OUTPUT: No intake/output data recorded.  PHYSICAL EXAMINATION: General:  Well developed, well nourished AA male Neuro:  Alert and oriented, follows commands, PERRLA HEENT:  Supple, no JVD Cardiovascular:  S1S2, NSR, no M/R/G Lungs:  Coarse bilateral bases diminished all other lobes, even, non labored Abdomen: faint BS x4, distended, tender, firm Musculoskeletal:  Normal bulk and tone  Skin:  Intact no rashes lesions  LABS:  BMET  Recent Labs Lab 05/10/16 1934  NA 133*  K 2.6*  CL 96*  CO2 23  BUN 19  CREATININE 2.09*  GLUCOSE 233*    Electrolytes  Recent Labs Lab 05/10/16 1934  CALCIUM 9.7    CBC  Recent Labs Lab 05/10/16 2024 05/10/16 2100  WBC 4.4 10.5  HGB 5.1* 12.7*  HCT 15.7* 38.6*  PLT 129* 286    Coag's  Recent Labs Lab 05/10/16 1934  INR 1.01    Sepsis Markers  Recent Labs Lab 05/10/16 1952  LATICACIDVEN 5.8*    ABG No results for input(s): PHART, PCO2ART, PO2ART in the last 168 hours.  Liver Enzymes  Recent Labs Lab 05/10/16 1934  AST 35  ALT 28  ALKPHOS 57  BILITOT 0.7  ALBUMIN 4.2    Cardiac Enzymes  Recent Labs Lab 05/10/16 1934  TROPONINI <0.03    Glucose No results for input(s): GLUCAP in the last 168 hours.  Imaging Ct Abdomen Pelvis Wo Contrast  Result Date: 05/10/2016 CLINICAL DATA:  Abdominal pain and decreased level of consciousness. Hypotension. EXAM: CT ABDOMEN AND PELVIS WITHOUT CONTRAST TECHNIQUE: Multidetector CT imaging of the abdomen and pelvis was performed following the standard protocol without IV contrast. COMPARISON:  None. FINDINGS: Lower chest: Lung bases are clear. No pleural effusions. Mild dilatation of the distal thoracic esophagus. Hepatobiliary: No focal liver abnormality is seen. No gallstones, gallbladder wall thickening, or biliary dilatation. Pancreas: Unremarkable. No pancreatic ductal dilatation or surrounding inflammatory changes. Spleen: Normal in size without  focal abnormality. Adrenals/Urinary Tract: Both adrenals are normal. 2 mm upper pole right collecting system calculus. Multiple low-attenuation lesions are scattered throughout both kidneys measuring up to 1.4 cm. These are indeterminate but more likely benign. Stomach/Bowel: Stomach is unremarkable. There is moderate small bowel dilatation with generous intraluminal fluid volume. There is a large volume of small bowel extending into the scrotum through a right inguinal hernia. There is caliber step-off at the exit of the inguinal hernia, consistent with a low-grade bowel obstruction. This is either distal ileum or cecum. Remainder of the colon is unobstructed. Distal descending and proximal sigmoid colon extend into the scrotum a thorough left inguinal hernia, nonobstructing. Vascular/Lymphatic: Moderate aortic atherosclerosis.  No aneurysm. Reproductive: Unremarkable. Other: No ascites.  No extraluminal air.  No pneumatosis. Small fat containing umbilical hernia. Musculoskeletal: No significant skeletal lesion. Moderate lumbar degenerative disc disease, greatest at L5-S1. IMPRESSION: 1. Low-grade bowel obstruction in a right inguinal hernia, with caliber transition at the exit from the hernia in the distal ileum or perhaps the cecum. 2. Large bilateral inguinal hernias with small bowel in the right hemiscrotum and distal transverse/proximal sigmoid colon in  the left hemiscrotum. The left inguinal hernia is nonobstructing. 3. Right nephrolithiasis, nonobstructing. 4. Mild fluid distention of the distal thoracic esophagus, perhaps related to the bowel obstruction but reflux, dysmotility, or obstructing distal esophageal stricture/lesion cannot be excluded. 5. Multiple low-attenuation renal lesions bilaterally, not characterized but more likely benign. 6. Aortic atherosclerosis. No evidence of aneurysm. No evidence of dissection on this unenhanced scan. 7. Incidental findings include a small fat containing umbilical  hernia and moderate lumbar degenerative disc disease. Electronically Signed   By: Ellery Plunk M.D.   On: 05/10/2016 20:15   Dg Chest 2 View  Result Date: 05/10/2016 CLINICAL DATA:  60 y/o  M; hypotension. EXAM: CHEST  2 VIEW COMPARISON:  05/10/2016 portable chest radiograph. FINDINGS: Low lung volumes accentuate pulmonary markings. No focal consolidation. No pleural effusion. Bones are unremarkable. Normal cardiac silhouette. Normal right peritracheal stripe. Previous right medial upper lobe opacity is not appreciated on this study, probably due to improved angulation of the vascular pedicle. IMPRESSION: Low lung volumes.  No acute pulmonary process. Electronically Signed   By: Mitzi Hansen M.D.   On: 05/10/2016 21:44   Dg Chest Port 1 View  Result Date: 05/10/2016 CLINICAL DATA:  Altered mental status, hypotension EXAM: PORTABLE CHEST 1 VIEW COMPARISON:  None. FINDINGS: There is shallow lung inflation. Heart size is normal. There is central right upper lobe opacity. The lungs are otherwise clear. IMPRESSION: Medial right upper lobe opacity is indeterminate on this supine, shallow lung inflation radiograph. Recommend further characterization with upright PA and lateral radiography when the patient is clinically able. Electronically Signed   By: Deatra Robinson M.D.   On: 05/10/2016 20:30   STUDIES:  CT Abdomen Pelvis 12/18>>Low-grade bowel obstruction in a right inguinal hernia, with caliber transition at the exit from the hernia in the distal ileum or perhaps the cecum. Large bilateral inguinal hernias with small bowel in the right hemiscrotum and distal transverse/proximal sigmoid colon in the left hemiscrotum. The left inguinal hernia is nonobstructing. Right nephrolithiasis, nonobstructing. Mild fluid distention of the distal thoracic esophagus, perhaps related to the bowel obstruction but reflux, dysmotility, or obstructing distal esophageal stricture/lesion cannot be  excluded. Multiple low-attenuation renal lesions bilaterally, not characterized but more likely benign. Aortic atherosclerosis. No evidence of aneurysm. No evidence of dissection on this unenhanced scan. Incidental findings include a small fat containing umbilical hernia and moderate lumbar degenerative disc disease.  CULTURES: Blood x2 12/18>>  ANTIBIOTICS: Vancomycin 1218>> Zosyn 12/18>>  SIGNIFICANT EVENTS: 12/18-Pt admitted to [facility] ICU due to SBO and septic shock with hypotension  LINES/TUBES: PIV's x2 12/18>>  ASSESSMENT / PLAN:  PULMONARY A: No acute issues P:   Supplemental O2 as needed to maintain O2 sats >92% per above  Prn CXR   CARDIOVASCULAR A:  Hypotension secondary to septic shock  Hx: HTN, Right sided CVA, and Hyperlipidemaia P:  IV fluid resuscitation  Maintain map >65 Continuous telemetry monitoring   RENAL A:   Acute renal failure  Hypokalemia   Lactic acidosis  P:   Repeat BMP 12/18 NS at 175 ml/hr Monitor UOP Replace electrolytes as indicated   GASTROINTESTINAL A:   Small bowel obstruction Chronically incarcerated right inguinal hernia Reducible left inguinal hernia  Nausea P:   Surgery consulted appreciate input Per Surgery pt does not need emergent surgical intervention at this time Nasogastric tube LIS for decompression if he begins to vomit or complains of abdominal pain per surgery Keep NPO Prn Zofran for nausea   HEMATOLOGIC A:  Anemia  P:  No chemical anticoagulation for now, SCD's for VTE prophylaxis  Trend CBC's Check PT/INR Monitor for s/sx of bleeding Transfuse for hgb <7  INFECTIOUS A:   Small bowel obstruction P:   Trend WBC's and monitor fever curve Trend PCT's and lactic acid Continue abx as listed above Follow cultures  ENDOCRINE A:   Hyperglycemia P:   SSI  CBG's q4hrs   NEUROLOGIC A:   Acute pain secondary to SBO Hx: Right sided CVA with right sided hemiparesis  P:   Prn Morphine for pain  management   FAMILY  - Updates: Updated pt and pts wife about plan of care and all questions answered 05/10/16  - Inter-disciplinary family meet or Palliative Care meeting due by:  05/17/16    Sonda Rumble, Juel Burrow  Pulmonary/Critical Care Pager [phone] (please enter 7 digits) PCCM Consult Pager [phone] (please enter 7 digits)

## 2016-05-10 NOTE — ED Notes (Signed)
Pabon, MD at bedside.

## 2016-05-10 NOTE — ED Provider Notes (Addendum)
[facility] Emergency Department Provider Note  ____________________________________________   I have reviewed the triage vital signs and the nursing notes.   HISTORY  Chief Complaint Loss of Consciousness and Abdominal Pain    HPI [name] is a 60 y.o. male with a history of hypertension and right-sided CVA, crit the patient, who is able to give a good history, he started having lower abdominal pain and his stomach was "going bad" gradually at around 5 PM.He ended vomit around 6 PM. According to family, he is pressure went down and became ashen and pale and she was diaphoretic apparently and they sent him here. Patient denies any chest pain or shortness of breath or cough. He denies any changes medications. He denies actually taking too much of his medication. He states the pain is lower abdominal, as a crampy pain. Does not radiate. He has not had this pain before that he can recall. He does have a scrotal hernia which we noticed on exam. He states is been there for approximately a few months. His never "given him trouble" feels somewhat tender to him. He denies any fever or chills. He specifically denies any thoracic complaints such as chest pain shortness of breath cough or vomiting. He's had no pain radiating to the back.  According to him, he was in his normal state of health until now. He is not on blood thinners. EMS found him to be hypotensive and initiated IV give him Zofran and transported him. Their EKG did not show any ischemic changes.   Past Medical History:  Diagnosis Date  . Hyperlipidemia   . Hypertension   . Stroke [facility](HCC)     Patient Active Problem List   Diagnosis Date Noted  . Medication monitoring encounter 11/26/2015  . Left ear pain 07/21/2015  . Pre-diabetes 02/18/2015  . Atherosclerosis of both carotid arteries 02/18/2015  . 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 Shoulder subluxation, right 04/23/2014  . Spastic hemiplegia affecting dominant side (HCC) 02/18/2014  . Hypertension goal BP (blood pressure) < 140/90 01/17/2014  . Hyperlipidemia LDL goal <70 01/17/2014    Past Surgical History:  Procedure Laterality Date  . KNEE ARTHROSCOPY Right     Prior to Admission medications   Medication Sig Start Date End Date Taking? Authorizing Provider  aspirin EC 325 MG tablet Take 1 tablet (325 mg total) by mouth daily. 03/10/16   Kerman PasseyMelinda P Lada, MD  atorvastatin (LIPITOR) 80 MG tablet Take 1 tablet (80 mg total) by mouth daily. (new cholesterol medicine, stop the pravastatin) 04/27/16   Kerman PasseyMelinda P Lada, MD  chlorthalidone (HYGROTON) 50 MG tablet Take 1 tablet (50 mg total) by mouth daily. 01/21/16   Kerman PasseyMelinda P Lada, MD  lisinopril (PRINIVIL,ZESTRIL) 40 MG tablet Take 1 tablet (40 mg total) by mouth daily. 04/19/16   Kerman PasseyMelinda P Lada, MD  meloxicam (MOBIC) 15 MG tablet Take 1 tablet (15 mg total) by mouth daily as needed for pain. 09/26/15   Kerman PasseyMelinda P Lada, MD  Multiple Vitamins-Minerals (ONE-A-DAY MENS HEALTH FORMULA) TABS Take 1 tablet by mouth daily.    Historical Provider, MD  Omega-3 Fatty Acids (FISH OIL) 1000 MG CAPS Take 1,000 mg by mouth daily.    Historical Provider, MD  Polyvinyl Alcohol-Povidone (MURINE TEARS FOR DRY EYES OP) Place 1 drop into both eyes 3 (three) times a week.    Historical Provider, MD  Allergies Patient has no known allergies.  Family History  Problem Relation Age of Onset  . Hypertension Mother     Social History Social History  Substance Use Topics  . Smoking status: Never Smoker  . Smokeless tobacco: Never Used  . Alcohol use No    Review of Systems Constitutional: No fever/chills Eyes: No visual changes. ENT: No sore throat. No stiff neck no neck pain Cardiovascular: Denies chest pain. Respiratory: Denies shortness of breath. Gastrointestinal:Positive vomiting.  No  diarrhea.  No constipation. Genitourinary: Negative for dysuria. Musculoskeletal: Negative lower extremity swelling Skin: Negative for rash. Neurological: Negative for severe headaches, focal weakness or numbness. 10-point ROS otherwise negative.  ____________________________________________   PHYSICAL EXAM:  VITAL SIGNS: ED Triage Vitals  Enc Vitals Group     BP 05/10/16 1934 (!) 73/47     Pulse Rate 05/10/16 1934 83     Resp 05/10/16 1934 (!) 24     Temp 05/10/16 2009 98.3 F (36.8 C)     Temp Source 05/10/16 2009 Rectal     SpO2 05/10/16 1934 93 %     Weight 05/10/16 1935 215 lb (97.5 kg)     Height --      Head Circumference --      Peak Flow --      Pain Score --      Pain Loc --      Pain Edu? --      Excl. in GC? --     Constitutional: Alert and oriented.Initially quite ill-appearing, gradually getting better with fluid. However he iRRL. EOMI. Head: Atraumatic. Nose: No congestion/rhinnorhea. Mouth/Throat: Mucous membranes are moist.  Oropharynx non-erythematous. Neck: No stridor.   Nontender with no meningismus Cardiovascular: Normal rate, regular rhythm. Grossly normal heart sounds.  Good peripheral circulation. Respiratory: Normal respiratory effort.  No retractions. Lungs CTAB. Abdominal: Distended and tender,  firm. There is a large scrotal hernia which is also somewhat tender but not red or hot to touch. Back:  There is no focal tenderness or step off.  there is no midline tenderness there are no lesions noted. there is no CVA tenderness Musculoskeletal: No lower extremity tenderness, no upper extremity tenderness. No joint effusions, no DVT signs strong distal pulses no edema Neurologic:  A slight right-sided weakness noted. No other obvious lesions noted Skin:  Skin is warm, dry and intact. No rash noted. Psychiatric: Mood and affect are normal. Speech and behavior are normal.  ____________________________________________   LABS (all labs ordered are  listed, but only abnormal results are displayed)  Labs Reviewed  CULTURE, BLOOD (ROUTINE X 2)  CULTURE, BLOOD (ROUTINE X 2)  PROTIME-INR  TROPONIN I  LACTIC ACID, PLASMA  LACTIC ACID, PLASMA  COMPREHENSIVE METABOLIC PANEL  ETHANOL  URINALYSIS, COMPLETE (UACMP) WITH MICROSCOPIC  TYPE AND SCREEN   ____________________________________________  EKG  I personally interpreted any EKGs ordered by me or triage Sinus rhythm rate 72 beats per an acute ST elevation or acute ST depression normal axis unremarkable EKG ____________________________________________  RADIOLOGY  I reviewed any imaging ordered by me or triage that were performed during my shift and, if possible, patient and/or family made aware of any abnormal findings. ____________________________________________   PROCEDURES  Procedure(s) performed: None  Procedures  Critical Care performed: CRITICAL CARE Performed by: Jeanmarie Plant   Total critical care time: 55 minutes  Critical care time was exclusive of separately billable procedures and treating other patients.  Critical care was necessary to treat or prevent imminent or  life-threatening deterioration.  Critical care was time spent personally by me on the following activities: development of treatment plan with patient and/or surrogate as well as nursing, discussions with consultants, evaluation of patient's response to treatment, examination of patient, obtaining history from patient or surrogate, ordering and performing treatments and interventions, ordering and review of laboratory studies, ordering and review of radiographic studies, pulse oximetry and re-evaluation of patient's condition.   ____________________________________________   INITIAL IMPRESSION / ASSESSMENT AND PLAN / ED COURSE  Pertinent labs & imaging results that were available during my care of the patient were reviewed by me and considered in my medical decision making (see chart for  details).  Patient with persistent hypotension getting IV fluids. Potassium is low we are repeating his potassium IV as he cannot take by mouth at this time. Patient with a reassuring EKG. He has no chest pain or shortness of breath. His sats are good, his lungs are clear. Chest x-ray pending. CT scan was emergently performed to rule out AAA in this hypotensive patient with abdominal pain. There is some evidence according to radiology of a SBO. Surgery agrees with our management thus far and they will come evaluate the patient. Patient remains lucid and awake. It is my hope that with IV fluids and bring up his pressure. At this time, we'll hold off on pressors preferring to begin with aggressive IV hydration, if we do need to change it we will do so.There is no evidence of aortic dissection by history. Patient has lower abdominal pain and vomiting. Obviously a noncontrasted CT scan is not a great study to evaluate up with her certainly no evidence of an intra-abdominal AAA and patient has had no chest pain and no shortness of breath and no back pain. No evidence of overdose of medications, he is afebrile white count is reassuring. There is no evidence of abdominal perforation according to radiology.    ----------------------------------------- 8:10 PM on 05/10/2016 -----------------------------------------  [place] with radiology, they feel the patient has an SBO, discussed with Dr. Everlene FarrierPabon, he agrees with IV fluid, and he agrees with Zosyn. He will come evaluate the patient.  ----------------------------------------- 9:00 PM on 05/10/2016 -----------------------------------------  Patient hemoglobin as noted is 5 but he is not evidently bleeding anywhere this is somewhat surprising. I went to recheck that prior to starting blood. He could be dilution all the nurses to draw back and waist prior to sending it. I talked to the ICU attending Dr. Zannie CoveSchott, who agrees with management and will accept onto his  service. He would like an NG tube which we will place.  ----------------------------------------- 9:51 PM on 05/10/2016 -----------------------------------------  Clinically, he did not look anemic and I his hemoglobin and this is reassuring. I see made aware. We are not going to give him a transfusion. I've canceled blood.   Clinical Course    ____________________________________________   FINAL CLINICAL IMPRESSION(S) / ED DIAGNOSES  Final diagnoses:  Hypotension      This chart was dictated using voice recognition software.  Despite best efforts to proofread,  errors can occur which can change meaning.      Jeanmarie PlantJames A McShane, MD 05/10/16 [id number]    Jeanmarie PlantJames A McShane, MD 05/10/16 [id number]    Jeanmarie PlantJames A McShane, MD 05/10/16 [id number]    Jeanmarie PlantJames A McShane, MD 05/10/16 2151

## 2016-05-10 NOTE — ED Triage Notes (Signed)
Pt arrived by EMS from home with c/o by family of abdominal pain, sudden onset of decreased level of conciseness. EMS reports pt was hypotensive 64/40, diaphoretic, with N/V. Upon arrival to ED pt is able to respond to questions asked, ashen in color, decreased LOC and hypotensive at 73/47. MD art bedside for further evaluation.

## 2016-05-10 NOTE — ED Notes (Signed)
Pt taken to CT with RN, Tech and MD at bedside.

## 2016-05-11 ENCOUNTER — Inpatient Hospital Stay: Payer: BLUE CROSS/BLUE SHIELD

## 2016-05-11 DIAGNOSIS — I959 Hypotension, unspecified: Secondary | ICD-10-CM

## 2016-05-11 DIAGNOSIS — K56609 Unspecified intestinal obstruction, unspecified as to partial versus complete obstruction: Secondary | ICD-10-CM

## 2016-05-11 DIAGNOSIS — N179 Acute kidney failure, unspecified: Secondary | ICD-10-CM

## 2016-05-11 LAB — TYPE AND SCREEN
ABO/RH(D): A POS
ANTIBODY SCREEN: NEGATIVE
UNIT DIVISION: 0
UNIT DIVISION: 0
UNIT DIVISION: 0
Unit division: 0

## 2016-05-11 LAB — BLOOD CULTURE ID PANEL (REFLEXED)
Acinetobacter baumannii: NOT DETECTED
CANDIDA GLABRATA: NOT DETECTED
CANDIDA TROPICALIS: NOT DETECTED
Candida albicans: NOT DETECTED
Candida krusei: NOT DETECTED
Candida parapsilosis: NOT DETECTED
ENTEROBACTER CLOACAE COMPLEX: NOT DETECTED
ENTEROCOCCUS SPECIES: NOT DETECTED
Enterobacteriaceae species: NOT DETECTED
Escherichia coli: NOT DETECTED
Haemophilus influenzae: NOT DETECTED
KLEBSIELLA PNEUMONIAE: NOT DETECTED
Klebsiella oxytoca: NOT DETECTED
Listeria monocytogenes: NOT DETECTED
Methicillin resistance: NOT DETECTED
NEISSERIA MENINGITIDIS: NOT DETECTED
PROTEUS SPECIES: NOT DETECTED
Pseudomonas aeruginosa: NOT DETECTED
SERRATIA MARCESCENS: NOT DETECTED
STAPHYLOCOCCUS AUREUS BCID: NOT DETECTED
STAPHYLOCOCCUS SPECIES: DETECTED — AB
STREPTOCOCCUS SPECIES: NOT DETECTED
Streptococcus agalactiae: NOT DETECTED
Streptococcus pneumoniae: NOT DETECTED
Streptococcus pyogenes: NOT DETECTED

## 2016-05-11 LAB — C DIFFICILE QUICK SCREEN W PCR REFLEX
C Diff antigen: NEGATIVE
C Diff interpretation: NOT DETECTED
C Diff toxin: NEGATIVE

## 2016-05-11 LAB — GLUCOSE, CAPILLARY
GLUCOSE-CAPILLARY: 103 mg/dL — AB (ref 65–99)
GLUCOSE-CAPILLARY: 105 mg/dL — AB (ref 65–99)
GLUCOSE-CAPILLARY: 123 mg/dL — AB (ref 65–99)
GLUCOSE-CAPILLARY: 125 mg/dL — AB (ref 65–99)
GLUCOSE-CAPILLARY: 85 mg/dL (ref 65–99)
GLUCOSE-CAPILLARY: 87 mg/dL (ref 65–99)

## 2016-05-11 LAB — CBC
HCT: 34.5 % — ABNORMAL LOW (ref 40.0–52.0)
HEMOGLOBIN: 11.6 g/dL — AB (ref 13.0–18.0)
MCH: 28.6 pg (ref 26.0–34.0)
MCHC: 33.7 g/dL (ref 32.0–36.0)
MCV: 84.9 fL (ref 80.0–100.0)
Platelets: 257 10*3/uL (ref 150–440)
RBC: 4.07 MIL/uL — AB (ref 4.40–5.90)
RDW: 14 % (ref 11.5–14.5)
WBC: 14.7 10*3/uL — ABNORMAL HIGH (ref 3.8–10.6)

## 2016-05-11 LAB — COMPREHENSIVE METABOLIC PANEL
ALK PHOS: 39 U/L (ref 38–126)
ALT: 29 U/L (ref 17–63)
AST: 42 U/L — ABNORMAL HIGH (ref 15–41)
Albumin: 3.7 g/dL (ref 3.5–5.0)
Anion gap: 8 (ref 5–15)
BUN: 24 mg/dL — ABNORMAL HIGH (ref 6–20)
CALCIUM: 8.3 mg/dL — AB (ref 8.9–10.3)
CO2: 24 mmol/L (ref 22–32)
CREATININE: 2.3 mg/dL — AB (ref 0.61–1.24)
Chloride: 104 mmol/L (ref 101–111)
GFR calc non Af Amer: 29 mL/min — ABNORMAL LOW (ref >60)
GFR, EST AFRICAN AMERICAN: 34 mL/min — AB (ref >60)
GLUCOSE: 136 mg/dL — AB (ref 65–99)
Potassium: 4.3 mmol/L (ref 3.5–5.1)
SODIUM: 136 mmol/L (ref 135–145)
Total Bilirubin: 0.8 mg/dL (ref 0.3–1.2)
Total Protein: 6.5 g/dL (ref 6.5–8.1)

## 2016-05-11 LAB — PROCALCITONIN
PROCALCITONIN: 58.21 ng/mL
Procalcitonin: 143.22 ng/mL

## 2016-05-11 LAB — LACTIC ACID, PLASMA
Lactic Acid, Venous: 2.4 mmol/L (ref 0.5–1.9)
Lactic Acid, Venous: 1.7 mmol/L (ref 0.5–1.9)

## 2016-05-11 LAB — PROTIME-INR
INR: 1.05
Prothrombin Time: 13.7 seconds (ref 11.4–15.2)

## 2016-05-11 LAB — PHOSPHORUS: PHOSPHORUS: 3.1 mg/dL (ref 2.5–4.6)

## 2016-05-11 LAB — MRSA PCR SCREENING: MRSA BY PCR: NEGATIVE

## 2016-05-11 LAB — MAGNESIUM: MAGNESIUM: 2 mg/dL (ref 1.7–2.4)

## 2016-05-11 LAB — POTASSIUM: POTASSIUM: 4.4 mmol/L (ref 3.5–5.1)

## 2016-05-11 MED ORDER — SODIUM CHLORIDE 0.9 % IV BOLUS (SEPSIS)
1000.0000 mL | Freq: Once | INTRAVENOUS | Status: AC
  Administered 2016-05-11: 1000 mL via INTRAVENOUS

## 2016-05-11 MED ORDER — ALBUMIN HUMAN 25 % IV SOLN
25.0000 g | Freq: Once | INTRAVENOUS | Status: AC
  Administered 2016-05-11: 25 g via INTRAVENOUS
  Filled 2016-05-11: qty 100

## 2016-05-11 MED ORDER — PIPERACILLIN-TAZOBACTAM 3.375 G IVPB
3.3750 g | Freq: Three times a day (TID) | INTRAVENOUS | Status: DC
  Administered 2016-05-11 – 2016-05-12 (×5): 3.375 g via INTRAVENOUS
  Filled 2016-05-11 (×5): qty 50

## 2016-05-11 MED ORDER — INSULIN ASPART 100 UNIT/ML ~~LOC~~ SOLN: 0.0000 [IU] | SUBCUTANEOUS | Status: DC

## 2016-05-11 MED ORDER — HEPARIN SODIUM (PORCINE) 5000 UNIT/ML IJ SOLN
5000.0000 [IU] | Freq: Three times a day (TID) | INTRAMUSCULAR | Status: DC
  Administered 2016-05-11 – 2016-05-15 (×12): 5000 [IU] via SUBCUTANEOUS
  Filled 2016-05-11 (×12): qty 1

## 2016-05-11 MED ORDER — SODIUM CHLORIDE 0.9 % IV SOLN
INTRAVENOUS | Status: DC
  Administered 2016-05-11 – 2016-05-15 (×10): via INTRAVENOUS

## 2016-05-11 MED ORDER — VANCOMYCIN HCL 10 G IV SOLR
1250.0000 mg | INTRAVENOUS | Status: DC
  Administered 2016-05-11: 1250 mg via INTRAVENOUS
  Filled 2016-05-11 (×2): qty 1250

## 2016-05-11 MED ORDER — SODIUM CHLORIDE 0.9% FLUSH: 10.0000 mL | INTRAVENOUS | Status: DC | PRN

## 2016-05-11 MED ORDER — FAMOTIDINE 20 MG PO TABS
20.0000 mg | ORAL_TABLET | Freq: Two times a day (BID) | ORAL | Status: DC
  Administered 2016-05-11 – 2016-05-13 (×5): 20 mg via ORAL
  Filled 2016-05-11 (×5): qty 1

## 2016-05-11 MED ORDER — PHENYLEPHRINE HCL 10 MG/ML IJ SOLN
0.0000 ug/min | INTRAMUSCULAR | Status: DC
  Administered 2016-05-11: 25 ug/min via INTRAVENOUS
  Filled 2016-05-11: qty 4

## 2016-05-11 MED ORDER — SODIUM CHLORIDE 0.9 % IV SOLN: INTRAVENOUS | Status: DC

## 2016-05-11 MED ORDER — SODIUM CHLORIDE 0.9% FLUSH
10.0000 mL | Freq: Two times a day (BID) | INTRAVENOUS | Status: DC
  Administered 2016-05-11: 30 mL
  Administered 2016-05-12 – 2016-05-14 (×4): 10 mL

## 2016-05-11 MED ORDER — LIDOCAINE HCL (PF) 1 % IJ SOLN
30.0000 mL | Freq: Once | INTRAMUSCULAR | Status: DC
  Filled 2016-05-11: qty 30

## 2016-05-11 MED ORDER — PHENYLEPHRINE HCL 10 MG/ML IJ SOLN
0.0000 ug/min | INTRAMUSCULAR | Status: DC
  Administered 2016-05-11: 5 ug/min via INTRAVENOUS
  Administered 2016-05-11: 30 ug/min via INTRAVENOUS
  Filled 2016-05-11 (×2): qty 1

## 2016-05-11 MED ORDER — SODIUM CHLORIDE 0.9 % IV BOLUS (SEPSIS)
500.0000 mL | Freq: Once | INTRAVENOUS | Status: AC
  Administered 2016-05-11: 500 mL via INTRAVENOUS

## 2016-05-11 MED ORDER — INSULIN ASPART 100 UNIT/ML ~~LOC~~ SOLN
0.0000 [IU] | SUBCUTANEOUS | Status: DC
  Administered 2016-05-11 – 2016-05-15 (×5): 1 [IU] via SUBCUTANEOUS
  Filled 2016-05-11 (×5): qty 1

## 2016-05-11 NOTE — Progress Notes (Signed)
CC: Abdominal pain Subjective: Patient reports that his abdominal pain has almost completely resolved. States he's feeling much better than when he came to the hospital. He's had numerous bowel movements since admission. Had 5 L of fluid resuscitation per nursing.  Objective: Vital signs in last 24 hours: Temp:  [98.3 F (36.8 C)-100.2 F (37.9 C)] 99.9 F (37.7 C) (12/19 0730) Pulse Rate:  [33-107] 98 (12/19 0730) Resp:  [14-35] 21 (12/19 0730) BP: (73-119)/(35-94) 102/56 (12/19 0730) SpO2:  [92 %-100 %] 97 % (12/19 0730) Weight:  [97.5 kg (214 lb 15.2 oz)-97.5 kg (215 lb)] 97.5 kg (214 lb 15.2 oz) (12/19 0400) Last BM Date: 05/10/16  Intake/Output from previous day: 12/18 0701 - 12/19 0700 In: 7686.4 [I.V.:4886.4; IV Piggyback:2800] Out: 775 [Urine:775] Intake/Output this shift: No intake/output data recorded.  Physical exam:  Gen.: No acute distress Chest: Clear to auscultation Heart: Regular rhythm Abdomen: Soft, nontender, nondistended. GU: Bilateral inguinal hernias noted on exam. They are soft and at least partially reducible bilaterally.  Lab Results: CBC   Recent Labs  05/10/16 2100 05/11/16 0422  WBC 10.5 14.7*  HGB 12.7* 11.6*  HCT 38.6* 34.5*  PLT 286 257   BMET  Recent Labs  05/10/16 1934 05/11/16 0422  NA 133* 136  K 2.6* 4.3  4.4  CL 96* 104  CO2 23 24  GLUCOSE 233* 136*  BUN 19 24*  CREATININE 2.09* 2.30*  CALCIUM 9.7 8.3*   PT/INR  Recent Labs  05/10/16 1934 05/11/16 0054  LABPROT 13.3 13.7  INR 1.01 1.05   ABG No results for input(s): PHART, HCO3 in the last 72 hours.  Invalid input(s): PCO2, PO2  Studies/Results: Ct Abdomen Pelvis Wo Contrast  Result Date: 05/10/2016 CLINICAL DATA:  Abdominal pain and decreased level of consciousness. Hypotension. EXAM: CT ABDOMEN AND PELVIS WITHOUT CONTRAST TECHNIQUE: Multidetector CT imaging of the abdomen and pelvis was performed following the standard protocol without IV contrast.  COMPARISON:  None. FINDINGS: Lower chest: Lung bases are clear. No pleural effusions. Mild dilatation of the distal thoracic esophagus. Hepatobiliary: No focal liver abnormality is seen. No gallstones, gallbladder wall thickening, or biliary dilatation. Pancreas: Unremarkable. No pancreatic ductal dilatation or surrounding inflammatory changes. Spleen: Normal in size without focal abnormality. Adrenals/Urinary Tract: Both adrenals are normal. 2 mm upper pole right collecting system calculus. Multiple low-attenuation lesions are scattered throughout both kidneys measuring up to 1.4 cm. These are indeterminate but more likely benign. Stomach/Bowel: Stomach is unremarkable. There is moderate small bowel dilatation with generous intraluminal fluid volume. There is a large volume of small bowel extending into the scrotum through a right inguinal hernia. There is caliber step-off at the exit of the inguinal hernia, consistent with a low-grade bowel obstruction. This is either distal ileum or cecum. Remainder of the colon is unobstructed. Distal descending and proximal sigmoid colon extend into the scrotum a thorough left inguinal hernia, nonobstructing. Vascular/Lymphatic: Moderate aortic atherosclerosis.  No aneurysm. Reproductive: Unremarkable. Other: No ascites.  No extraluminal air.  No pneumatosis. Small fat containing umbilical hernia. Musculoskeletal: No significant skeletal lesion. Moderate lumbar degenerative disc disease, greatest at L5-S1. IMPRESSION: 1. Low-grade bowel obstruction in a right inguinal hernia, with caliber transition at the exit from the hernia in the distal ileum or perhaps the cecum. 2. Large bilateral inguinal hernias with small bowel in the right hemiscrotum and distal transverse/proximal sigmoid colon in the left hemiscrotum. The left inguinal hernia is nonobstructing. 3. Right nephrolithiasis, nonobstructing. 4. Mild fluid distention of the distal  thoracic esophagus, perhaps related to the  bowel obstruction but reflux, dysmotility, or obstructing distal esophageal stricture/lesion cannot be excluded. 5. Multiple low-attenuation renal lesions bilaterally, not characterized but more likely benign. 6. Aortic atherosclerosis. No evidence of aneurysm. No evidence of dissection on this unenhanced scan. 7. Incidental findings include a small fat containing umbilical hernia and moderate lumbar degenerative disc disease. Electronically Signed   By: Ellery Plunkaniel R Mitchell M.D.   On: 05/10/2016 20:15   Dg Chest 2 View  Result Date: 05/10/2016 CLINICAL DATA:  60 y/o  M; hypotension. EXAM: CHEST  2 VIEW COMPARISON:  05/10/2016 portable chest radiograph. FINDINGS: Low lung volumes accentuate pulmonary markings. No focal consolidation. No pleural effusion. Bones are unremarkable. Normal cardiac silhouette. Normal right peritracheal stripe. Previous right medial upper lobe opacity is not appreciated on this study, probably due to improved angulation of the vascular pedicle. IMPRESSION: Low lung volumes.  No acute pulmonary process. Electronically Signed   By: Mitzi HansenLance  Furusawa-Stratton M.D.   On: 05/10/2016 21:44   Dg Chest Port 1 View  Result Date: 05/10/2016 CLINICAL DATA:  Altered mental status, hypotension EXAM: PORTABLE CHEST 1 VIEW COMPARISON:  None. FINDINGS: There is shallow lung inflation. Heart size is normal. There is central right upper lobe opacity. The lungs are otherwise clear. IMPRESSION: Medial right upper lobe opacity is indeterminate on this supine, shallow lung inflation radiograph. Recommend further characterization with upright PA and lateral radiography when the patient is clinically able. Electronically Signed   By: Deatra RobinsonKevin  Herman M.D.   On: 05/10/2016 20:30   Dg Abd Acute W/chest  Result Date: 05/11/2016 CLINICAL DATA:  60 year old male with small bowel obstruction. Subsequent encounter. EXAM: DG ABDOMEN ACUTE W/ 1V CHEST COMPARISON:  05/10/2016 chest x-ray. 05/10/2016 CT abdomen and  pelvis. FINDINGS: No acute pulmonary abnormality. Gas-filled prominent size small bowel loops most notable left upper quadrant measuring up to 3.8 cm. Slightly thickened folds. Findings may represent result of low-grade small bowel obstruction. Patient's bowel containing inguinal hernias noted on CT are not as well delineated on the present plain film examination. No free intraperitoneal air. Foley catheter is in place. IMPRESSION: Gas-filled prominent size small bowel loops most notable left upper quadrant measuring up to 3.8 cm. Slightly thickened folds. Findings may represent result of low-grade small bowel obstruction. Patient's bowel containing inguinal hernias noted on CT are not as well delineated on the present plain film examination. Fluid containing small bowel loops noted on the recent CT are not adequately assessed by plain film examination. Electronically Signed   By: Lacy DuverneySteven  Olson M.D.   On: 05/11/2016 08:10    Anti-infectives: Anti-infectives    Start     Dose/Rate Route Frequency Ordered Stop   05/11/16 0400  vancomycin (VANCOCIN) 1,250 mg in sodium chloride 0.9 % 250 mL IVPB  Status:  Discontinued     1,250 mg 166.7 mL/hr over 90 Minutes Intravenous Every 18 hours 05/11/16 0150 05/11/16 0856   05/11/16 0400  piperacillin-tazobactam (ZOSYN) IVPB 3.375 g     3.375 g 12.5 mL/hr over 240 Minutes Intravenous Every 8 hours 05/11/16 0150     05/10/16 2230  vancomycin (VANCOCIN) IVPB 1000 mg/200 mL premix     1,000 mg 200 mL/hr over 60 Minutes Intravenous  Once 05/10/16 2222 05/11/16 0015   05/10/16 2015  piperacillin-tazobactam (ZOSYN) IVPB 3.375 g     3.375 g 100 mL/hr over 30 Minutes Intravenous  Once 05/10/16 2009 05/10/16 2033      Assessment/Plan:  60 year old male admitted  with hypotension and evidence of sepsis. Noted have bilateral inguinal hernias on exam and CT scan. There remains soft and at least partially reducible. There is no evidence of strangulation to either hernia.  It is my opinion that the source of his sepsis is some or other than his hernias, however surgery will continue to follow as he will eventually require hernia repair for these large inguinal hernias.  Lizzie An T. Tonita Cong, MD, FACS  05/11/2016

## 2016-05-11 NOTE — Progress Notes (Signed)
Pharmacy Antibiotic Note  [name] is a 60 y.o. male admitted on 05/10/2016 with small bowel obstruction and sepsis.  Pharmacy has been consulted for Zosyn dosing.  Plan: Zosyn EI 3.375g IV Q8hr.   Likely source is GI, vancomycin discontinued during AM rounds.   Height: 6\' 3"  (190.5 cm) Weight: 214 lb 15.2 oz (97.5 kg) IBW/kg (Calculated) : 84.5  Temp (24hrs), Avg:99.8 F (37.7 C), Min:98.3 F (36.8 C), Max:100.2 F (37.9 C)   Recent Labs Lab 05/10/16 1934 05/10/16 1952 05/10/16 2024 05/10/16 2100 05/11/16 0054 05/11/16 0422  WBC  --   --  4.4 10.5  --  14.7*  CREATININE 2.09*  --   --   --   --  2.30*  LATICACIDVEN  --  5.8*  --   --  2.4* 1.7    Estimated Creatinine Clearance: 40.3 mL/min (by C-G formula based on SCr of 2.3 mg/dL (H)).    No Known Allergies  Antimicrobials this admission: Vancomycin 12/18 >> 12/19 Zosyn 12/18 >>   Dose adjustments this admission: N/A  Microbiology results: 12/18 BCx: no growth < 12 hours  12/19 UCx: sent  12/19 Cdiff PCR: negative  12/18 MRSA PCR: negative   Pharmacy will continue to monitor and adjust per consult.   Simpson,Michael L 05/11/2016 10:57 AM

## 2016-05-11 NOTE — Progress Notes (Signed)
MEDICATION RELATED CONSULT NOTE   Pharmacy Consult for electrolyte management   Pharmacy consulted for electrolyte management for 60 yo male admitted with small bowel obstruction/sepsis.    Plan:   No replacement warranted at this time. Will obtain follow-up electrolytes with am labs.   No Known Allergies  Patient Measurements: Height: 6\' 3"  (190.5 cm) Weight: 214 lb 15.2 oz (97.5 kg) IBW/kg (Calculated) : 84.5  Vital Signs: Temp: 99.5 F (37.5 C) (12/19 1000) Temp Source: Other (Comment) (12/19 0400) BP: 88/61 (12/19 1000) Pulse Rate: 93 (12/19 1000) Intake/Output from previous day: 12/18 0701 - 12/19 0700 In: 7686.4 [I.V.:4886.4; IV Piggyback:2800] Out: 775 [Urine:775] Intake/Output from this shift: Total I/O In: 397.5 [I.V.:397.5] Out: -   Labs:  Recent Labs  05/10/16 1934 05/10/16 2024 05/10/16 2100 05/11/16 0422  WBC  --  4.4 10.5 14.7*  HGB  --  5.1* 12.7* 11.6*  HCT  --  15.7* 38.6* 34.5*  PLT  --  129* 286 257  CREATININE 2.09*  --   --  2.30*  MG  --   --   --  2.0  PHOS  --   --   --  3.1  ALBUMIN 4.2  --   --  3.7  PROT 7.8  --   --  6.5  AST 35  --   --  42*  ALT 28  --   --  29  ALKPHOS 57  --   --  39  BILITOT 0.7  --   --  0.8   Estimated Creatinine Clearance: 40.3 mL/min (by C-G formula based on SCr of 2.3 mg/dL (H)).   Pharmacy will continue to monitor and adjust per protocol.   Simpson,Michael L 05/11/2016,11:05 AM

## 2016-05-11 NOTE — Procedures (Signed)
Central Venous Catheter Placement: Indication: Patient receiving vesicant or irritant drug.; Patient receiving intravenous therapy for longer than 5 days.; Patient has limited or no vascular access.   Consent:verbal  Risks and benefits explained in detail including risk of infection, bleeding, respiratory failure and death..   Hand washing performed prior to starting the procedure.   Procedure: An active timeout was performed and correct patient, name, & ID confirmed.  After explaining risk and benefits, patient was positioned correctly for central venous access. Patient was prepped using strict sterile technique including chlorohexadine preps, sterile drape, sterile gown and sterile gloves.  The area was prepped, draped and anesthetized in the usual sterile manner. Patient comfort was obtained.  A triple lumen catheter was placed in RT Internal Jugular Vein There was good blood return, catheter caps were placed on lumens, catheter flushed easily, the line was secured and a sterile dressing and BIO-PATCH applied.   Ultrasound was used to visualize vasculature and guidance of needle.   Number of Attempts: 1 Complications:none Estimated Blood Loss: none Chest Radiograph indicated and ordered.  Operator: Denton LankKasa/Blakeny   Donye Campanelli David Damon Baisch, M.D.  [facility]  Medical Director [facility] Medical Director [facility]

## 2016-05-11 NOTE — Progress Notes (Signed)
Pharmacy Antibiotic Note  [name] is a 60 y.o. male admitted on 05/10/2016 with sepsis.  Pharmacy has been consulted for Zosyn and vancomycin dosing.  Plan: 1. Zosyn 3.375 gm IV Q8H EI 2. Vancomycin 1 gm IV x 1 in ED followed in 6 hours (stacked dosing) by vancomycin 1.25 gm IV Q18H, predicted trough 18 mcg/mL. Pharmacy will continue to follow and adjust as needed to maintain trough 15 to 20 mcg/mL.   Vd 62.8 L, ke 0.041 hr-1, T1/2 16.8 hr  Height: 6\' 3"  (190.5 cm) Weight: 214 lb 15.2 oz (97.5 kg) IBW/kg (Calculated) : 84.5  Temp (24hrs), Avg:99.6 F (37.6 C), Min:98.3 F (36.8 C), Max:100.2 F (37.9 C)   Recent Labs Lab 05/10/16 1934 05/10/16 1952 05/10/16 2024 05/10/16 2100  WBC  --   --  4.4 10.5  CREATININE 2.09*  --   --   --   LATICACIDVEN  --  5.8*  --   --     Estimated Creatinine Clearance: 44.4 mL/min (by C-G formula based on SCr of 2.09 mg/dL (H)).    No Known Allergies  Thank you for allowing pharmacy to be a part of this patient's care.  Carola FrostNathan A Dewan Emond, Pharm.D., BCPS Clinical Pharmacist 05/11/2016 1:51 AM

## 2016-05-11 NOTE — Progress Notes (Signed)
Pt seen and examined he is feeling better. Has only made 100cc urine but c/o no abdominal pain or Nausea Pt still hypotension but upon my arrival he was not on IVF  HR 90s Had a BM a passing gas  PE NAD Awake alert Abd: soft, NT, Right inguinal hernia soft, chronically incarcerated. No peritonitis  A/P Chronic incarceration of BIH, no evidence of strangulation, still hypotensive unsure of the reason, TSH high and AKI.  D/w Critical care, will give crystalloid bolus and albumin Recheck kub this am No need for surgical intervention

## 2016-05-11 NOTE — Progress Notes (Signed)
PHARMACY - PHYSICIAN COMMUNICATION CRITICAL VALUE ALERT - BLOOD CULTURE IDENTIFICATION (BCID)  Results for orders placed or performed during the hospital encounter of 05/10/16  Blood Culture ID Panel (Reflexed) (Collected: 05/10/2016  7:52 PM)  Result Value Ref Range   Enterococcus species NOT DETECTED NOT DETECTED   Listeria monocytogenes NOT DETECTED NOT DETECTED   Staphylococcus species DETECTED (A) NOT DETECTED   Staphylococcus aureus NOT DETECTED NOT DETECTED   Methicillin resistance NOT DETECTED NOT DETECTED   Streptococcus species NOT DETECTED NOT DETECTED   Streptococcus agalactiae NOT DETECTED NOT DETECTED   Streptococcus pneumoniae NOT DETECTED NOT DETECTED   Streptococcus pyogenes NOT DETECTED NOT DETECTED   Acinetobacter baumannii NOT DETECTED NOT DETECTED   Enterobacteriaceae species NOT DETECTED NOT DETECTED   Enterobacter cloacae complex NOT DETECTED NOT DETECTED   Escherichia coli NOT DETECTED NOT DETECTED   Klebsiella oxytoca NOT DETECTED NOT DETECTED   Klebsiella pneumoniae NOT DETECTED NOT DETECTED   Proteus species NOT DETECTED NOT DETECTED   Serratia marcescens NOT DETECTED NOT DETECTED   Haemophilus influenzae NOT DETECTED NOT DETECTED   Neisseria meningitidis NOT DETECTED NOT DETECTED   Pseudomonas aeruginosa NOT DETECTED NOT DETECTED   Candida albicans NOT DETECTED NOT DETECTED   Candida glabrata NOT DETECTED NOT DETECTED   Candida krusei NOT DETECTED NOT DETECTED   Candida parapsilosis NOT DETECTED NOT DETECTED   Candida tropicalis NOT DETECTED NOT DETECTED    Name of physician (or Provider) Contacted: Dr. Dema SeverinMungal   Changes to prescribed antibiotics required: No changes at this time. Will continue with Zosyn   Gardner CandleSheema M Dmarion Perfect, PharmD, BCPS Clinical Pharmacist 05/11/2016 7:22 PM

## 2016-05-12 LAB — CBC
HCT: 32.3 % — ABNORMAL LOW (ref 40.0–52.0)
Hemoglobin: 10.9 g/dL — ABNORMAL LOW (ref 13.0–18.0)
MCH: 28.6 pg (ref 26.0–34.0)
MCHC: 33.9 g/dL (ref 32.0–36.0)
MCV: 84.3 fL (ref 80.0–100.0)
PLATELETS: 234 10*3/uL (ref 150–440)
RBC: 3.83 MIL/uL — ABNORMAL LOW (ref 4.40–5.90)
RDW: 14.1 % (ref 11.5–14.5)
WBC: 10 10*3/uL (ref 3.8–10.6)

## 2016-05-12 LAB — BASIC METABOLIC PANEL
Anion gap: 6 (ref 5–15)
BUN: 29 mg/dL — ABNORMAL HIGH (ref 6–20)
CALCIUM: 7.7 mg/dL — AB (ref 8.9–10.3)
CHLORIDE: 110 mmol/L (ref 101–111)
CO2: 22 mmol/L (ref 22–32)
CREATININE: 3.01 mg/dL — AB (ref 0.61–1.24)
GFR calc Af Amer: 24 mL/min — ABNORMAL LOW (ref >60)
GFR calc non Af Amer: 21 mL/min — ABNORMAL LOW (ref >60)
Glucose, Bld: 103 mg/dL — ABNORMAL HIGH (ref 65–99)
Potassium: 3.5 mmol/L (ref 3.5–5.1)
SODIUM: 138 mmol/L (ref 135–145)

## 2016-05-12 LAB — GLUCOSE, CAPILLARY
GLUCOSE-CAPILLARY: 106 mg/dL — AB (ref 65–99)
Glucose-Capillary: 82 mg/dL (ref 65–99)
Glucose-Capillary: 96 mg/dL (ref 65–99)
Glucose-Capillary: 87 mg/dL (ref 65–99)
Glucose-Capillary: 75 mg/dL (ref 65–99)

## 2016-05-12 LAB — PROCALCITONIN: Procalcitonin: 158.95 ng/mL

## 2016-05-12 LAB — URINE CULTURE: Culture: NO GROWTH

## 2016-05-12 MED ORDER — METRONIDAZOLE 500 MG PO TABS
500.0000 mg | ORAL_TABLET | Freq: Three times a day (TID) | ORAL | Status: DC
  Administered 2016-05-12 – 2016-05-15 (×9): 500 mg via ORAL
  Filled 2016-05-12 (×9): qty 1

## 2016-05-12 MED ORDER — CIPROFLOXACIN HCL 500 MG PO TABS
500.0000 mg | ORAL_TABLET | Freq: Two times a day (BID) | ORAL | Status: DC
  Administered 2016-05-12 – 2016-05-15 (×7): 500 mg via ORAL
  Filled 2016-05-12 (×7): qty 1

## 2016-05-12 MED ORDER — SODIUM CHLORIDE 0.9 % IV BOLUS (SEPSIS)
1000.0000 mL | Freq: Once | INTRAVENOUS | Status: AC
  Administered 2016-05-12: 12:00:00 via INTRAVENOUS
  Administered 2016-05-12: 1000 mL via INTRAVENOUS

## 2016-05-12 NOTE — Progress Notes (Signed)
CC: Abdominal pain Subjective: Patient reports his abdominal pain is completely resolved and has been her 24 hours. He states he feels much better since having a bowel movement yesterday and states that he is hungry. Continues to be low-dose vasopressor though.  Objective: Vital signs in last 24 hours: Temp:  [98.8 F (37.1 C)-99.7 F (37.6 C)] 99 F (37.2 C) (12/20 1200) Pulse Rate:  [73-97] 82 (12/20 1200) Resp:  [16-24] 24 (12/20 1200) BP: (78-119)/(46-80) 109/57 (12/20 1200) SpO2:  [94 %-100 %] 98 % (12/20 1200) Weight:  [98.4 kg (216 lb 14.9 oz)] 98.4 kg (216 lb 14.9 oz) (12/20 0500) Last BM Date: 05/11/16  Intake/Output from previous day: 12/19 0701 - 12/20 0700 In: 4861.1 [P.O.:240; I.V.:4471.1; IV Piggyback:150] Out: 1910 [Urine:1910] Intake/Output this shift: Total I/O In: 1189.2 [P.O.:240; I.V.:782.5; IV Piggyback:166.7] Out: 700 [Urine:700]  Physical exam:  Gen.: No acute distress Chest: Clear to auscultation Heart: Alert and rhythm Abdomen: Soft, nontender, nondistended GU: Bilateral hernia is again appreciated that are soft and at least partially reducible bilaterally.  Lab Results: CBC   Recent Labs  05/11/16 0422 05/12/16 0421  WBC 14.7* 10.0  HGB 11.6* 10.9*  HCT 34.5* 32.3*  PLT 257 234   BMET  Recent Labs  05/11/16 0422 05/12/16 0421  NA 136 138  K 4.3  4.4 3.5  CL 104 110  CO2 24 22  GLUCOSE 136* 103*  BUN 24* 29*  CREATININE 2.30* 3.01*  CALCIUM 8.3* 7.7*   PT/INR  Recent Labs  05/10/16 1934 05/11/16 0054  LABPROT 13.3 13.7  INR 1.01 1.05   ABG No results for input(s): PHART, HCO3 in the last 72 hours.  Invalid input(s): PCO2, PO2  Studies/Results: Ct Abdomen Pelvis Wo Contrast  Result Date: 05/10/2016 CLINICAL DATA:  Abdominal pain and decreased level of consciousness. Hypotension. EXAM: CT ABDOMEN AND PELVIS WITHOUT CONTRAST TECHNIQUE: Multidetector CT imaging of the abdomen and pelvis was performed following the  standard protocol without IV contrast. COMPARISON:  None. FINDINGS: Lower chest: Lung bases are clear. No pleural effusions. Mild dilatation of the distal thoracic esophagus. Hepatobiliary: No focal liver abnormality is seen. No gallstones, gallbladder wall thickening, or biliary dilatation. Pancreas: Unremarkable. No pancreatic ductal dilatation or surrounding inflammatory changes. Spleen: Normal in size without focal abnormality. Adrenals/Urinary Tract: Both adrenals are normal. 2 mm upper pole right collecting system calculus. Multiple low-attenuation lesions are scattered throughout both kidneys measuring up to 1.4 cm. These are indeterminate but more likely benign. Stomach/Bowel: Stomach is unremarkable. There is moderate small bowel dilatation with generous intraluminal fluid volume. There is a large volume of small bowel extending into the scrotum through a right inguinal hernia. There is caliber step-off at the exit of the inguinal hernia, consistent with a low-grade bowel obstruction. This is either distal ileum or cecum. Remainder of the colon is unobstructed. Distal descending and proximal sigmoid colon extend into the scrotum a thorough left inguinal hernia, nonobstructing. Vascular/Lymphatic: Moderate aortic atherosclerosis.  No aneurysm. Reproductive: Unremarkable. Other: No ascites.  No extraluminal air.  No pneumatosis. Small fat containing umbilical hernia. Musculoskeletal: No significant skeletal lesion. Moderate lumbar degenerative disc disease, greatest at L5-S1. IMPRESSION: 1. Low-grade bowel obstruction in a right inguinal hernia, with caliber transition at the exit from the hernia in the distal ileum or perhaps the cecum. 2. Large bilateral inguinal hernias with small bowel in the right hemiscrotum and distal transverse/proximal sigmoid colon in the left hemiscrotum. The left inguinal hernia is nonobstructing. 3. Right nephrolithiasis, nonobstructing. 4.  Mild fluid distention of the distal  thoracic esophagus, perhaps related to the bowel obstruction but reflux, dysmotility, or obstructing distal esophageal stricture/lesion cannot be excluded. 5. Multiple low-attenuation renal lesions bilaterally, not characterized but more likely benign. 6. Aortic atherosclerosis. No evidence of aneurysm. No evidence of dissection on this unenhanced scan. 7. Incidental findings include a small fat containing umbilical hernia and moderate lumbar degenerative disc disease. Electronically Signed   By: Ellery Plunk M.D.   On: 05/10/2016 20:15   Dg Chest 2 View  Result Date: 05/10/2016 CLINICAL DATA:  60 y/o  M; hypotension. EXAM: CHEST  2 VIEW COMPARISON:  05/10/2016 portable chest radiograph. FINDINGS: Low lung volumes accentuate pulmonary markings. No focal consolidation. No pleural effusion. Bones are unremarkable. Normal cardiac silhouette. Normal right peritracheal stripe. Previous right medial upper lobe opacity is not appreciated on this study, probably due to improved angulation of the vascular pedicle. IMPRESSION: Low lung volumes.  No acute pulmonary process. Electronically Signed   By: Mitzi Hansen M.D.   On: 05/10/2016 21:44   Dg Chest Port 1 View  Result Date: 05/11/2016 CLINICAL DATA:  Central line placement. EXAM: PORTABLE CHEST 1 VIEW COMPARISON:  05/11/2016 FINDINGS: Right jugular catheter has been placed and terminates near the cavoatrial junction. The cardiomediastinal silhouette is unchanged. The lungs are hypoinflated without evidence of airspace consolidation, edema, pleural effusion, or pneumothorax. No acute osseous abnormality is identified. IMPRESSION: Right jugular catheter terminating near the cavoatrial junction. Electronically Signed   By: Sebastian Ache M.D.   On: 05/11/2016 09:31   Dg Chest Port 1 View  Result Date: 05/10/2016 CLINICAL DATA:  Altered mental status, hypotension EXAM: PORTABLE CHEST 1 VIEW COMPARISON:  None. FINDINGS: There is shallow lung  inflation. Heart size is normal. There is central right upper lobe opacity. The lungs are otherwise clear. IMPRESSION: Medial right upper lobe opacity is indeterminate on this supine, shallow lung inflation radiograph. Recommend further characterization with upright PA and lateral radiography when the patient is clinically able. Electronically Signed   By: Deatra Robinson M.D.   On: 05/10/2016 20:30   Dg Abd Acute W/chest  Result Date: 05/11/2016 CLINICAL DATA:  60 year old male with small bowel obstruction. Subsequent encounter. EXAM: DG ABDOMEN ACUTE W/ 1V CHEST COMPARISON:  05/10/2016 chest x-ray. 05/10/2016 CT abdomen and pelvis. FINDINGS: No acute pulmonary abnormality. Gas-filled prominent size small bowel loops most notable left upper quadrant measuring up to 3.8 cm. Slightly thickened folds. Findings may represent result of low-grade small bowel obstruction. Patient's bowel containing inguinal hernias noted on CT are not as well delineated on the present plain film examination. No free intraperitoneal air. Foley catheter is in place. IMPRESSION: Gas-filled prominent size small bowel loops most notable left upper quadrant measuring up to 3.8 cm. Slightly thickened folds. Findings may represent result of low-grade small bowel obstruction. Patient's bowel containing inguinal hernias noted on CT are not as well delineated on the present plain film examination. Fluid containing small bowel loops noted on the recent CT are not adequately assessed by plain film examination. Electronically Signed   By: Lacy Duverney M.D.   On: 05/11/2016 08:10    Anti-infectives: Anti-infectives    Start     Dose/Rate Route Frequency Ordered Stop   05/12/16 1400  metroNIDAZOLE (FLAGYL) tablet 500 mg     500 mg Oral Every 8 hours 05/12/16 1212     05/12/16 1230  ciprofloxacin (CIPRO) tablet 500 mg     500 mg Oral 2 times daily 05/12/16  1221     05/11/16 0400  vancomycin (VANCOCIN) 1,250 mg in sodium chloride 0.9 % 250 mL  IVPB  Status:  Discontinued     1,250 mg 166.7 mL/hr over 90 Minutes Intravenous Every 18 hours 05/11/16 0150 05/11/16 0856   05/11/16 0400  piperacillin-tazobactam (ZOSYN) IVPB 3.375 g  Status:  Discontinued     3.375 g 12.5 mL/hr over 240 Minutes Intravenous Every 8 hours 05/11/16 0150 05/12/16 1212   05/10/16 2230  vancomycin (VANCOCIN) IVPB 1000 mg/200 mL premix     1,000 mg 200 mL/hr over 60 Minutes Intravenous  Once 05/10/16 2222 05/11/16 0015   05/10/16 2015  piperacillin-tazobactam (ZOSYN) IVPB 3.375 g     3.375 g 100 mL/hr over 30 Minutes Intravenous  Once 05/10/16 2009 05/10/16 2033      Assessment/Plan:  60 year old male admitted to the ICU for hypotension and sepsis of unclear etiology. His hernias are reducible and not strangulated. Unlikely to be the actual source of his critical illness. His BUN and creatinine continued to rise from an unclear cause. His vasopressor requirement has been being weaned and patient is clinically doing much better. No plans for urgent surgical intervention at this time and discussed with the patient likely need for an outpatient surgical repair of his hernias in the near future.  Jurney Overacker T. Tonita CongWoodham, MD, FACS  05/12/2016

## 2016-05-12 NOTE — Progress Notes (Signed)
Informed pt that his foley catheter could be removed due to him being able to ambulate and move around in the bed. Pt stated he did not want to use a urinal and requested that I leave the foley in. Foley left in place per pt request.

## 2016-05-12 NOTE — Progress Notes (Signed)
PULMONARY / CRITICAL CARE MEDICINE   Name: [name] MRN: [id number] DOB: 11/12/1955    ADMISSION DATE:  05/10/2016 CONSULTATION DATE:  05/10/2016  REFERRING MD:  Dr. 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  [facility] 12/18 with c/o severe lower abdominal cramping onset 17:00 today 12/18.  Per ER notes he began vomiting at approximately 18:00 and according to family he became ashen, pale, and diaphoretic prompting visit to ER.  He does have a scrotal hernia and according to the pt it has been there for a few months, however it has not caused any problems up until now.  He also states he has had bilateral inguinal hernias for several years.  He endorses having regular bowel movements most recently today 12/18 and denies any s/sx of bleeding.  Per Surgeon Dr. Hurman HornPabon's note CT Abdomen Pelvis revealed large BIH with some dilation of small bowel without evidence of free air, no evidence of true strangulation, no evidence of mural thickening to suggest ischemia.  In the ER the pt was hypotensive with an elevated lactic acid.  PCCM contacted to admit pt to ICU due to small bowel obstruction and hypotension secondary to septic shock.      REVIEW OF SYSTEMS:   Gen: Denies fever, chills, weight change, fatigue, night sweats HEENT: Denies blurred vision, double vision, hearing loss, tinnitus, sinus congestion, rhinorrhea, sore throat, neck stiffness, dysphagia PULM: Denies shortness of breath, cough, sputum production, hemoptysis, wheezing CV: Denies chest pain, edema, orthopnea, paroxysmal nocturnal dyspnea, palpitations GI: - vomiting, diarrhea, hematochezia, melena, constipation, change in bowel habits - SUBJECTIVE:  c/o mild abdominal pain no other complaints at this time, eating food, passing gas and having  BM's Will wean off vasopressors as tolerated  VITAL SIGNS: BP 113/67 (BP Location: Right Arm)   Pulse 79   Temp 98.8 F (37.1 C) (Core (Comment))   Resp (!) 22   Ht 6\' 3"  (1.905 m)   Wt 216 lb 14.9 oz (98.4 kg)   SpO2 98%   BMI 27.11 kg/m    INTAKE / OUTPUT: I/O last 3 completed shifts: In: 12547.5 [P.O.:240; I.V.:9357.5; IV Piggyback:2950] Out: 2685 [Urine:2685]  PHYSICAL EXAMINATION: General:  Well developed, well nourished AA male Neuro:  Alert and oriented, follows commands, PERRLA HEENT:  Supple, no JVD Cardiovascular:  S1S2, NSR, no M/R/G Lungs:  Coarse bilateral bases diminished all other lobes, even, non labored Abdomen: faint BS x4, distended, tender, firm Musculoskeletal:  Normal bulk and tone  Skin:  Intact no rashes lesions  LABS:  BMET  Recent Labs Lab 05/10/16 1934 05/11/16 0422 05/12/16 0421  NA 133* 136 138  K 2.6* 4.3  4.4 3.5  CL 96* 104 110  CO2 23 24 22   BUN 19 24* 29*  CREATININE 2.09* 2.30* 3.01*  GLUCOSE 233* 136* 103*    Electrolytes  Recent Labs Lab 05/10/16 1934 05/11/16 0422 05/12/16 0421  CALCIUM 9.7 8.3* 7.7*  MG  --  2.0  --   PHOS  --  3.1  --     CBC  Recent Labs Lab 05/10/16 2100 05/11/16 0422 05/12/16 0421  WBC 10.5 14.7* 10.0  HGB 12.7* 11.6* 10.9*  HCT 38.6* 34.5* 32.3*  PLT 286 257 234    Coag's  Recent Labs Lab 05/10/16 1934 05/11/16  0054  INR 1.01 1.05    Sepsis Markers  Recent Labs Lab 05/10/16 1952 05/11/16 0054 05/11/16 0422 05/12/16 0421  LATICACIDVEN 5.8* 2.4* 1.7  --   PROCALCITON  --  58.21 143.22 158.95    ABG No results for input(s): PHART, PCO2ART, PO2ART in the last 168 hours.  Liver Enzymes  Recent Labs Lab 05/10/16 1934 05/11/16 0422  AST 35 42*  ALT 28 29  ALKPHOS 57 39  BILITOT 0.7 0.8  ALBUMIN 4.2 3.7    Cardiac Enzymes  Recent Labs Lab 05/10/16 1934  TROPONINI <0.03    Glucose  Recent Labs Lab 05/11/16 1148 05/11/16 1551 05/11/16 1949  05/11/16 2333 05/12/16 0411 05/12/16 0750  GLUCAP 103* 87 85 105* 106* 82    Imaging No results found. STUDIES:  CT Abdomen Pelvis 12/18>>Low-grade bowel obstruction in a right inguinal hernia, with caliber transition at the exit from the hernia in the distal ileum or perhaps the cecum. Large bilateral inguinal hernias with small bowel in the right hemiscrotum and distal transverse/proximal sigmoid colon in the left hemiscrotum. The left inguinal hernia is nonobstructing. Right nephrolithiasis, nonobstructing. Mild fluid distention of the distal thoracic esophagus, perhaps related to the bowel obstruction but reflux, dysmotility, or obstructing distal esophageal stricture/lesion cannot be excluded. Multiple low-attenuation renal lesions bilaterally, not characterized but more likely benign. Aortic atherosclerosis. No evidence of aneurysm. No evidence of dissection on this unenhanced scan. Incidental findings include a small fat containing umbilical hernia and moderate lumbar degenerative disc disease.  CULTURES: Blood x2 12/18>>  ANTIBIOTICS: Vancomycin 1218>>12/20 Zosyn 12/18>>  SIGNIFICANT EVENTS: 12/18-[facility] due to SBO and septic shock with hypotension  LINES/TUBES: PIV's x2 12/18>>  ASSESSMENT / PLAN:  PULMONARY A: No acute issues P:   Supplemental O2 as needed to maintain O2 sats >92% per above  Prn CXR   CARDIOVASCULAR A:  Hypotension secondary to septic shock  Hx: HTN, Right sided CVA, and Hyperlipidemaia P:  IV fluid resuscitation  Maintain map >65 Continuous telemetry monitoring  Wean off vasopressors as tolerated, will give 1 l NS bolus today  RENAL A:   Acute renal failure  Hypokalemia   Lactic acidosis  P:   Repeat BMP 12/18 NS at 175 ml/hr Monitor UOP Replace electrolytes as indicated   GASTROINTESTINAL A:   Small bowel obstruction Chronically incarcerated right inguinal hernia Reducible left inguinal hernia  Nausea P:    Surgery consulted appreciate input Per Surgery pt does not need emergent surgical intervention at this time Nasogastric tube LIS for decompression if he begins to vomit or complains of abdominal pain per surgery Diet as tolerated Prn Zofran for nausea   HEMATOLOGIC A:   Anemia  P:  No chemical anticoagulation for now, SCD's for VTE prophylaxis  Trend CBC's Check PT/INR Monitor for s/sx of bleeding Transfuse for hgb <7  INFECTIOUS A:   Small bowel obstruction P:   Trend WBC's and monitor fever curve Trend PCT's and lactic acid Continue abx as listed above Follow cultures  ENDOCRINE A:   Hyperglycemia P:   SSI  CBG's q4hrs   NEUROLOGIC A:   Acute pain secondary to SBO Hx: 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   Patient satisfied with Plan of action and management. All questions answered  Lucie Leather, M.D.  [facility]  Medical Director [facility] Medical Director [facility] Cardio-Pulmonary Department

## 2016-05-13 LAB — MAGNESIUM: Magnesium: 2.1 mg/dL (ref 1.7–2.4)

## 2016-05-13 LAB — GLUCOSE, CAPILLARY
GLUCOSE-CAPILLARY: 80 mg/dL (ref 65–99)
GLUCOSE-CAPILLARY: 79 mg/dL (ref 65–99)
GLUCOSE-CAPILLARY: 108 mg/dL — AB (ref 65–99)
GLUCOSE-CAPILLARY: 81 mg/dL (ref 65–99)
GLUCOSE-CAPILLARY: 87 mg/dL (ref 65–99)
Glucose-Capillary: 76 mg/dL (ref 65–99)

## 2016-05-13 LAB — BASIC METABOLIC PANEL
Anion gap: 8 (ref 5–15)
BUN: 26 mg/dL — AB (ref 6–20)
CHLORIDE: 114 mmol/L — AB (ref 101–111)
CO2: 20 mmol/L — AB (ref 22–32)
CREATININE: 3.05 mg/dL — AB (ref 0.61–1.24)
Calcium: 8.1 mg/dL — ABNORMAL LOW (ref 8.9–10.3)
GFR calc Af Amer: 24 mL/min — ABNORMAL LOW (ref >60)
GFR calc non Af Amer: 21 mL/min — ABNORMAL LOW (ref >60)
GLUCOSE: 84 mg/dL (ref 65–99)
POTASSIUM: 3.7 mmol/L (ref 3.5–5.1)
SODIUM: 142 mmol/L (ref 135–145)

## 2016-05-13 LAB — CBC
HCT: 35.6 % — ABNORMAL LOW (ref 40.0–52.0)
HEMOGLOBIN: 12 g/dL — AB (ref 13.0–18.0)
MCH: 28.3 pg (ref 26.0–34.0)
MCHC: 33.6 g/dL (ref 32.0–36.0)
MCV: 84.3 fL (ref 80.0–100.0)
Platelets: 239 10*3/uL (ref 150–440)
RBC: 4.22 MIL/uL — AB (ref 4.40–5.90)
RDW: 14.3 % (ref 11.5–14.5)
WBC: 9.2 10*3/uL (ref 3.8–10.6)

## 2016-05-13 LAB — PHOSPHORUS: Phosphorus: 3.5 mg/dL (ref 2.5–4.6)

## 2016-05-13 MED ORDER — ASPIRIN EC 325 MG PO TBEC
325.0000 mg | DELAYED_RELEASE_TABLET | Freq: Every day | ORAL | Status: DC
  Administered 2016-05-13 – 2016-05-15 (×3): 325 mg via ORAL
  Filled 2016-05-13 (×3): qty 1

## 2016-05-13 MED ORDER — ADULT MULTIVITAMIN W/MINERALS CH
1.0000 | ORAL_TABLET | Freq: Every day | ORAL | Status: DC
  Administered 2016-05-13 – 2016-05-14 (×2): 1 via ORAL
  Filled 2016-05-13 (×2): qty 1

## 2016-05-13 MED ORDER — ATORVASTATIN CALCIUM 20 MG PO TABS
80.0000 mg | ORAL_TABLET | Freq: Every day | ORAL | Status: DC
  Administered 2016-05-13: 80 mg via ORAL
  Filled 2016-05-13 (×2): qty 4

## 2016-05-13 MED ORDER — OMEGA-3-ACID ETHYL ESTERS 1 G PO CAPS
1.0000 g | ORAL_CAPSULE | Freq: Every day | ORAL | Status: DC
  Administered 2016-05-13 – 2016-05-15 (×3): 1 g via ORAL
  Filled 2016-05-13 (×3): qty 1

## 2016-05-13 NOTE — Progress Notes (Signed)
Pharmacy Antibiotic Note  [name] is a 60 y.o. male admitted on 05/10/2016 with small bowel obstruction and sepsis.  Pharmacy has been consulted for ciprofloxacin dosing. Patient also started on metronidazole 500mg  PO Q8hr on 12/20.    Plan: Ciprofloxacin 500mg  PO Q12hr.     Height: 6\' 3"  (190.5 cm) Weight: 206 lb 9.1 oz (93.7 kg) IBW/kg (Calculated) : 84.5  Temp (24hrs), Avg:99.9 F (37.7 C), Min:98.2 F (36.8 C), Max:100.6 F (38.1 C)   Recent Labs Lab 05/10/16 1934 05/10/16 1952 05/10/16 2024 05/10/16 2100 05/11/16 0054 05/11/16 0422 05/12/16 0421 05/13/16 0413  WBC  --   --  4.4 10.5  --  14.7* 10.0 9.2  CREATININE 2.09*  --   --   --   --  2.30* 3.01* 3.05*  LATICACIDVEN  --  5.8*  --   --  2.4* 1.7  --   --     Estimated Creatinine Clearance: 30.4 mL/min (by C-G formula based on SCr of 3.05 mg/dL (H)).    No Known Allergies  Antimicrobials this admission: Vancomycin 12/18 >> 12/19 Zosyn 12/18 >> 12/20 Metronidazole 12/20 >>  Ciprofloxacin 12/20 >>  Dose adjustments this admission: N/A  Microbiology results: 12/18 BCx: Coag Negative Staph x 1 12/19 UCx: no growth  12/19 Cdiff PCR: negative  12/18 MRSA PCR: negative   Pharmacy will continue to monitor and adjust per consult.   [name] L 05/13/2016 12:19 PM

## 2016-05-13 NOTE — Progress Notes (Signed)
CC: Abdominal pain Subjective: Patient's abdominal pain has resolved. He is tolerating a diet and has been having bowel function. Denies any abdominal pain.  Objective: Vital signs in last 24 hours: Temp:  [98.2 F (36.8 C)-100.6 F (38.1 C)] 98.2 F (36.8 C) (12/21 0800) Pulse Rate:  [84-97] 96 (12/21 0800) Resp:  [17-34] 26 (12/21 0800) BP: (100-132)/(57-81) 128/74 (12/21 0800) SpO2:  [95 %-100 %] 99 % (12/21 0800) Weight:  [93.7 kg (206 lb 9.1 oz)] 93.7 kg (206 lb 9.1 oz) (12/21 0401) Last BM Date: 05/11/16  Intake/Output from previous day: 12/20 0701 - 12/21 0700 In: 3564.2 [P.O.:240; I.V.:3157.5; IV Piggyback:166.7] Out: 3350 [Urine:3350] Intake/Output this shift: Total I/O In: 739.6 [P.O.:480; I.V.:259.6] Out: -   Physical exam:  Gen.: No acute distress Chest: Clear to auscultation Heart: Regular rate and rhythm Abdomen: Soft, nontender, nondistended  Lab Results: CBC   Recent Labs  05/12/16 0421 05/13/16 0413  WBC 10.0 9.2  HGB 10.9* 12.0*  HCT 32.3* 35.6*  PLT 234 239   BMET  Recent Labs  05/12/16 0421 05/13/16 0413  NA 138 142  K 3.5 3.7  CL 110 114*  CO2 22 20*  GLUCOSE 103* 84  BUN 29* 26*  CREATININE 3.01* 3.05*  CALCIUM 7.7* 8.1*   PT/INR  Recent Labs  05/10/16 1934 05/11/16 0054  LABPROT 13.3 13.7  INR 1.01 1.05   ABG No results for input(s): PHART, HCO3 in the last 72 hours.  Invalid input(s): PCO2, PO2  Studies/Results: No results found.  Anti-infectives: Anti-infectives    Start     Dose/Rate Route Frequency Ordered Stop   05/12/16 1400  metroNIDAZOLE (FLAGYL) tablet 500 mg     500 mg Oral Every 8 hours 05/12/16 1212     05/12/16 1230  ciprofloxacin (CIPRO) tablet 500 mg     500 mg Oral 2 times daily 05/12/16 1221     05/11/16 0400  vancomycin (VANCOCIN) 1,250 mg in sodium chloride 0.9 % 250 mL IVPB  Status:  Discontinued     1,250 mg 166.7 mL/hr over 90 Minutes Intravenous Every 18 hours 05/11/16 0150 05/11/16  0856   05/11/16 0400  piperacillin-tazobactam (ZOSYN) IVPB 3.375 g  Status:  Discontinued     3.375 g 12.5 mL/hr over 240 Minutes Intravenous Every 8 hours 05/11/16 0150 05/12/16 1212   05/10/16 2230  vancomycin (VANCOCIN) IVPB 1000 mg/200 mL premix     1,000 mg 200 mL/hr over 60 Minutes Intravenous  Once 05/10/16 2222 05/11/16 0015   05/10/16 2015  piperacillin-tazobactam (ZOSYN) IVPB 3.375 g     3.375 g 100 mL/hr over 30 Minutes Intravenous  Once 05/10/16 2009 05/10/16 2033      Assessment/Plan:  60 year old male with hypotension and sepsis of unclear etiology. Continues to have reducible hernias without any evidence of abdominal pain. No plans for urgent surgical intervention. Please call again at the Gen. surgery service can be of further assistance with this patient.  Osborn Pullin T. Tonita CongWoodham, MD, FACS  05/13/2016

## 2016-05-13 NOTE — Progress Notes (Signed)
PULMONARY / CRITICAL CARE MEDICINE   Name: [name] MRN: [id number] DOB: 05/28/1955    ADMISSION DATE:  05/10/2016 CONSULTATION DATE:  05/10/2016  REFERRING MD:  Dr. 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  He presented to [facility] ER 12/18 with c/o severe lower abdominal cramping onset 17:00 today 12/18.  Per ER notes he began vomiting at approximately 18:00 and according to family he became ashen, pale, and diaphoretic prompting visit to ER.  He does have a scrotal hernia and according to the pt it has been there for a few months, however it has not caused any problems up until now.  He also states he has had bilateral inguinal hernias for several years.  He endorses having regular bowel movements most recently today 12/18 and denies any s/sx of bleeding.  Per Surgeon Dr. Hurman HornPabon's note CT Abdomen Pelvis revealed large BIH with some dilation of small bowel without evidence of free air, no evidence of true strangulation, no evidence of mural thickening to suggest ischemia.  In the ER the pt was hypotensive with an elevated lactic acid.  PCCM contacted to admit pt to ICU due to small bowel obstruction and hypotension secondary to septic shock.      REVIEW OF SYSTEMS:   Gen: Denies fever, chills, weight change, fatigue, night sweats HEENT: Denies blurred vision, double vision, hearing loss, tinnitus, sinus congestion, rhinorrhea, sore throat, neck stiffness, dysphagia PULM: Denies shortness of breath, cough, sputum production, hemoptysis, wheezing CV: Denies chest pain, edema, orthopnea, paroxysmal nocturnal dyspnea, palpitations GI: - vomiting, diarrhea, hematochezia, melena, constipation, change in bowel habits - SUBJECTIVE:  c/o mild abdominal pain no other complaints at this time, eating food, passing gas and having  BM's Off vasopressors  VITAL SIGNS: BP 123/71   Pulse 93   Temp 99.1 F (37.3 C) (Oral)   Resp (!) 23   Ht 6\' 3"  (1.905 m)   Wt 206 lb 9.1 oz (93.7 kg)   SpO2 98%   BMI 25.82 kg/m    INTAKE / OUTPUT: I/O last 3 completed shifts: In: 5746.8 [P.O.:480; I.V.:5000.2; IV Piggyback:266.7] Out: 4510 [Urine:4510]  PHYSICAL EXAMINATION: General:  Well developed, well nourished AA male Neuro:  Alert and oriented, follows commands, PERRLA HEENT:  Supple, no JVD Cardiovascular:  S1S2, NSR, no M/R/G Lungs:  Coarse bilateral bases diminished all other lobes, even, non labored Abdomen: faint BS x4, distended, tender, firm Musculoskeletal:  Normal bulk and tone  Skin:  Intact no rashes lesions  LABS:  BMET  Recent Labs Lab 05/11/16 0422 05/12/16 0421 05/13/16 0413  NA 136 138 142  K 4.3  4.4 3.5 3.7  CL 104 110 114*  CO2 24 22 20*  BUN 24* 29* 26*  CREATININE 2.30* 3.01* 3.05*  GLUCOSE 136* 103* 84    Electrolytes  Recent Labs Lab 05/11/16 0422 05/12/16 0421 05/13/16 0413  CALCIUM 8.3* 7.7* 8.1*  MG 2.0  --  2.1  PHOS 3.1  --  3.5    CBC  Recent Labs Lab 05/11/16 0422 05/12/16 0421 05/13/16 0413  WBC 14.7* 10.0 9.2  HGB 11.6* 10.9* 12.0*  HCT 34.5* 32.3* 35.6*  PLT 257 234 239    Coag's  Recent Labs Lab 05/10/16 1934 05/11/16 0054  INR 1.01 1.05    Sepsis Markers  Recent Labs  Lab 05/10/16 1952 05/11/16 0054 05/11/16 0422 05/12/16 0421  LATICACIDVEN 5.8* 2.4* 1.7  --   PROCALCITON  --  58.21 143.22 158.95    ABG No results for input(s): PHART, PCO2ART, PO2ART in the last 168 hours.  Liver Enzymes  Recent Labs Lab 05/10/16 1934 05/11/16 0422  AST 35 42*  ALT 28 29  ALKPHOS 57 39  BILITOT 0.7 0.8  ALBUMIN 4.2 3.7    Cardiac Enzymes  Recent Labs Lab 05/10/16 1934  TROPONINI <0.03    Glucose  Recent Labs Lab 05/12/16 1135 05/12/16 1604 05/12/16 1941 05/13/16 0004 05/13/16 0359 05/13/16 0727  GLUCAP 96 87 75  76 80 79    Imaging No results found. STUDIES:  CT Abdomen Pelvis 12/18>>Low-grade bowel obstruction in a right inguinal hernia, with caliber transition at the exit from the hernia in the distal ileum or perhaps the cecum. Large bilateral inguinal hernias with small bowel in the right hemiscrotum and distal transverse/proximal sigmoid colon in the left hemiscrotum. The left inguinal hernia is nonobstructing. Right nephrolithiasis, nonobstructing. Mild fluid distention of the distal thoracic esophagus, perhaps related to the bowel obstruction but reflux, dysmotility, or obstructing distal esophageal stricture/lesion cannot be excluded. Multiple low-attenuation renal lesions bilaterally, not characterized but more likely benign. Aortic atherosclerosis. No evidence of aneurysm. No evidence of dissection on this unenhanced scan. Incidental findings include a small fat containing umbilical hernia and moderate lumbar degenerative disc disease.  CULTURES: Blood x2 12/18>>  ANTIBIOTICS: Vancomycin 1218>>12/20 Zosyn 12/18>>  SIGNIFICANT EVENTS: 12/18-Pt admitted to [facility] ICU due to SBO and septic shock with hypotension  LINES/TUBES: PIV's x2 12/18>>  ASSESSMENT / PLAN:   Likely DX is Gastroenteritis with hypovolumic shock   PULMONARY A: No acute issues P:   Supplemental O2 as needed to maintain O2 sats >92% per above  Prn CXR   CARDIOVASCULAR A:  Hypotension secondary to septic shock  Hx: HTN, Right sided CVA, and Hyperlipidemaia P:  IV fluid resuscitation  Maintain map >65 Off vasopressors  RENAL A:   Acute renal failure  Hypokalemia   Lactic acidosis  P:   Repeat BMP 12/18 NS at 175 ml/hr Monitor UOP Replace electrolytes as indicated   GASTROINTESTINAL A:   Small bowel obstruction Chronically incarcerated right inguinal hernia Reducible left inguinal hernia  Nausea P:   Surgery consulted appreciate input Per Surgery pt does not need emergent surgical  intervention at this time Diet as tolerated Prn Zofran for nausea   HEMATOLOGIC A:   Anemia  P:  No chemical anticoagulation for now, SCD's for VTE prophylaxis  Trend CBC's Check PT/INR Monitor for s/sx of bleeding Transfuse for hgb <7  INFECTIOUS A:   Small bowel obstruction P:   Trend WBC's and monitor fever curve Trend PCT's and lactic acid Continue abx as listed above Follow cultures  ENDOCRINE A:   Hyperglycemia P:   SSI  CBG's q4hrs   NEUROLOGIC A:   Acute pain secondary to SBO Hx: 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   Patient satisfied with Plan of action and management. All questions answered  Lucie Leather, M.D.  [facility]  Medical Director [facility]  [facility] Medical Director [facility] Cardio[facility] Department

## 2016-05-13 NOTE — Progress Notes (Signed)
Pt is alert and oriented, no complaints of pain.  NSR, lungs clear on room air.  Tolerating diet, pt had loose/watery BM.  Foley removed at 0200.  Pt up in room to chair and bathroom.  VSS, afebrile.  Has remained off  Neosynephrine gtt since yesterday afternoon.

## 2016-05-13 NOTE — Progress Notes (Signed)
Patient transferred to new room 230 via bed. No complications during transfer.

## 2016-05-14 LAB — GLUCOSE, CAPILLARY
GLUCOSE-CAPILLARY: 102 mg/dL — AB (ref 65–99)
GLUCOSE-CAPILLARY: 108 mg/dL — AB (ref 65–99)
GLUCOSE-CAPILLARY: 112 mg/dL — AB (ref 65–99)
GLUCOSE-CAPILLARY: 136 mg/dL — AB (ref 65–99)
Glucose-Capillary: 103 mg/dL — ABNORMAL HIGH (ref 65–99)
Glucose-Capillary: 106 mg/dL — ABNORMAL HIGH (ref 65–99)

## 2016-05-14 LAB — PROTEIN / CREATININE RATIO, URINE
Creatinine, Urine: 126 mg/dL
PROTEIN CREATININE RATIO: 0.1 mg/mg{creat} (ref 0.00–0.15)
TOTAL PROTEIN, URINE: 13 mg/dL

## 2016-05-14 MED ORDER — ALUM & MAG HYDROXIDE-SIMETH 200-200-20 MG/5ML PO SUSP
30.0000 mL | Freq: Four times a day (QID) | ORAL | Status: DC | PRN
  Administered 2016-05-14: 30 mL via ORAL
  Filled 2016-05-14: qty 30

## 2016-05-14 MED ORDER — ENSURE ENLIVE PO LIQD
237.0000 mL | Freq: Two times a day (BID) | ORAL | Status: DC
  Administered 2016-05-14: 237 mL via ORAL

## 2016-05-14 NOTE — Progress Notes (Signed)
Pharmacy Antibiotic Note  [name] is a 60 y.o. male admitted on 05/10/2016 with small bowel obstruction and sepsis.  Pharmacy has been consulted for ciprofloxacin dosing. Patient also started on metronidazole 500mg  PO Q8hr on 12/20.    Plan: Ciprofloxacin 500mg  PO Q12hr.  Continue to monitor renal function.   Height: 6\' 3"  (190.5 cm) Weight: 206 lb 9.1 oz (93.7 kg) IBW/kg (Calculated) : 84.5  Temp (24hrs), Avg:98.6 F (37 C), Min:97.9 F (36.6 C), Max:99.1 F (37.3 C)   Recent Labs Lab 05/10/16 1934 05/10/16 1952 05/10/16 2024 05/10/16 2100 05/11/16 0054 05/11/16 0422 05/12/16 0421 05/13/16 0413  WBC  --   --  4.4 10.5  --  14.7* 10.0 9.2  CREATININE 2.09*  --   --   --   --  2.30* 3.01* 3.05*  LATICACIDVEN  --  5.8*  --   --  2.4* 1.7  --   --     Estimated Creatinine Clearance: 30.4 mL/min (by C-G formula based on SCr of 3.05 mg/dL (H)).    No Known Allergies  Antimicrobials this admission: Vancomycin 12/18 >> 12/19 Zosyn 12/18 >> 12/20 Metronidazole 12/20 >>  Ciprofloxacin 12/20 >>  Dose adjustments this admission: N/A  Microbiology results: 12/18 BCx: Coag Negative Staph x 1 12/19 UCx: no growth  12/19 Cdiff PCR: negative  12/18 MRSA PCR: negative   Pharmacy will continue to monitor and adjust per consult.   Randell Teare C 05/14/2016 9:43 AM

## 2016-05-14 NOTE — Progress Notes (Signed)
Pt requested Ensure as he noted he is not eating good due to ABD pain. MD notified and order for Ensure placed

## 2016-05-14 NOTE — Consult Note (Signed)
[facility] CONSULT NOTE    Date: 05/14/2016                  Patient Name:  [name]  MRN: [id number]  DOB: 05/21/1956  Age / Sex: 60 y.o., male         PCP: Baruch GoutyMelinda Lada, MD                 Service Requesting Consult: Hospitalist                 Reason for Consult: Acute renal failure            History of Present Illness: Patient is a 60 y.o. male with a PMHx of Hypertension, hyperlipidemia, CVA with residual right-sided weakness, who was admitted to [facility] on 05/10/2016 for evaluation of loss of consciousness and abdominal pain. He was admitted on 05/10/2016. When he presented he was having severe abdominal pain. Later in the day he became ashen pale and diaphoretic. Patient has had history of bilateral inguinal hernias for several years.  He was evaluated by surgery. They did not feel that the hernias were related to his presenting illness. Patient did have partial small bowel obstruction which appears to be improving.  The patient's baseline creatinine is 1.03 on 11/18/2015. Upon presentation his creatinine was 2.09.  This is peaked now at 3.05. He denies ingestion of NSAIDs other than aspirin at home. He did not receive contrast during this admission.  His urinalysis is negative for proteinuria or hematuria.   Medications: Outpatient medications: Prescriptions Prior to Admission  Medication Sig Dispense Refill Last Dose  . aspirin EC 325 MG tablet Take 1 tablet (325 mg total) by mouth daily. 90 tablet 3 05/10/2016 at Unknown time  . atorvastatin (LIPITOR) 80 MG tablet Take 1 tablet (80 mg total) by mouth daily. (new cholesterol medicine, stop the pravastatin) (Patient taking differently: Take 80 mg by mouth at bedtime. (new cholesterol medicine, stop the pravastatin)) 30 tablet 1 05/09/2016 at Unknown time  . chlorthalidone (HYGROTON) 50 MG tablet Take 1 tablet (50 mg total) by mouth daily. (Patient taking differently: Take 50 mg by mouth at bedtime. )  90 tablet 1 05/09/2016 at Unknown time  . lisinopril (PRINIVIL,ZESTRIL) 40 MG tablet Take 1 tablet (40 mg total) by mouth daily. 90 tablet 0 05/10/2016 at Unknown time  . Multiple Vitamins-Minerals (ONE-A-DAY MENS HEALTH FORMULA) TABS Take 1 tablet by mouth daily.   05/10/2016 at Unknown time  . Omega-3 Fatty Acids (FISH OIL) 1000 MG CAPS Take 1,000 mg by mouth daily.   05/10/2016 at Unknown time    Current medications: Current Facility-Administered Medications  Medication Dose Route Frequency Provider Last Rate Last Dose  . 0.9 %  sodium chloride infusion  250 mL Intravenous PRN Eugenie Norrieana G Blakeney, NP      . 0.9 %  sodium chloride infusion   Intravenous Continuous Eugenie Norrieana G Blakeney, NP 125 mL/hr at 05/14/16 1239    . acetaminophen (TYLENOL) tablet 650 mg  650 mg Oral Q4H PRN Eugenie Norrieana G Blakeney, NP   650 mg at 05/13/16 1818  . alum & mag hydroxide-simeth (MAALOX/MYLANTA) 200-200-20 MG/5ML suspension 30 mL  30 mL Oral Q6H PRN Shaune PollackQing Chen, MD   30 mL at 05/14/16 1412  . aspirin EC tablet 325 mg  325 mg Oral Daily Erin FullingKurian Kasa, MD   325 mg at 05/14/16 1012  . atorvastatin (LIPITOR) tablet 80 mg  80 mg Oral q1800 Erin FullingKurian Kasa, MD  80 mg at 05/13/16 1737  . ciprofloxacin (CIPRO) tablet 500 mg  500 mg Oral BID Erin FullingKurian Kasa, MD   500 mg at 05/14/16 0841  . heparin injection 5,000 Units  5,000 Units Subcutaneous Q8H Erin FullingKurian Kasa, MD   5,000 Units at 05/14/16 1344  . insulin aspart (novoLOG) injection 0-9 Units  0-9 Units Subcutaneous Q4H Eugenie Norrieana G Blakeney, NP   Stopped at 05/11/16 1600  . ipratropium-albuterol (DUONEB) 0.5-2.5 (3) MG/3ML nebulizer solution 3 mL  3 mL Nebulization Q6H PRN Eugenie Norrieana G Blakeney, NP      . lidocaine (PF) (XYLOCAINE) 1 % injection 30 mL  30 mL Other Once Eugenie Norrieana G Blakeney, NP      . metroNIDAZOLE (FLAGYL) tablet 500 mg  500 mg Oral Q8H Erin FullingKurian Kasa, MD   500 mg at 05/14/16 1344  . morphine 4 MG/ML injection 1-2 mg  1-2 mg Intravenous Q4H PRN Eugenie Norrieana G Blakeney, NP      . multivitamin with minerals  tablet 1 tablet  1 tablet Oral Daily Erin FullingKurian Kasa, MD   1 tablet at 05/14/16 1012  . omega-3 acid ethyl esters (LOVAZA) capsule 1 g  1 g Oral Daily Erin FullingKurian Kasa, MD   1 g at 05/14/16 1012  . ondansetron (ZOFRAN) injection 4 mg  4 mg Intravenous Q6H PRN Eugenie Norrieana G Blakeney, NP   4 mg at 05/14/16 1414  . sodium chloride flush (NS) 0.9 % injection 10-40 mL  10-40 mL Intracatheter Q12H Erin FullingKurian Kasa, MD   10 mL at 05/13/16 1000  . sodium chloride flush (NS) 0.9 % injection 10-40 mL  10-40 mL Intracatheter PRN Erin FullingKurian Kasa, MD          Allergies: No Known Allergies    Past Medical History: Past Medical History:  Diagnosis Date  . Hyperlipidemia   . Hypertension   . Stroke [facility](HCC)      Past Surgical History: Past Surgical History:  Procedure Laterality Date  . KNEE ARTHROSCOPY Right      Family History: Family History  Problem Relation Age of Onset  . Hypertension Mother      Social History: Social History   Social History  . Marital status: Married    Spouse name: N/A  . Number of children: N/A  . Years of education: N/A   Occupational History  . Not on file.   Social History Main Topics  . Smoking status: Never Smoker  . Smokeless tobacco: Never Used  . Alcohol use No  . Drug use: No  . Sexual activity: No   Other Topics Concern  . Not on file   Social History Narrative  . No narrative on file     Review of Systems: Review of Systems  Constitutional: Negative for chills, fever and malaise/fatigue.  HENT: Negative for hearing loss.   Eyes: Negative for blurred vision and double vision.  Respiratory: Negative for cough and hemoptysis.   Cardiovascular: Negative for chest pain and orthopnea.  Gastrointestinal: Positive for abdominal pain, nausea and vomiting. Negative for heartburn.  Genitourinary: Negative for dysuria, frequency and urgency.  Musculoskeletal: Negative for back pain and myalgias.  Skin: Negative for itching and rash.  Neurological: Negative for  dizziness and focal weakness.  Endo/Heme/Allergies: Negative for polydipsia. Does not bruise/bleed easily.  Psychiatric/Behavioral: Negative for depression and hallucinations.     Vital Signs: Blood pressure 136/88, pulse 91, temperature 98.6 F (37 C), temperature source Oral, resp. rate 17, height 6\' 3"  (1.905 m), weight 93.7 kg (206 lb 9.1 oz),  SpO2 98 %.  Weight trends: Filed Weights   05/11/16 0400 05/12/16 0500 05/13/16 0401  Weight: 97.5 kg (214 lb 15.2 oz) 98.4 kg (216 lb 14.9 oz) 93.7 kg (206 lb 9.1 oz)    Physical Exam: General: NAD, sitting up in bed  Head: Normocephalic, atraumatic.  Eyes: Anicteric, EOMI  Nose: Mucous membranes moist, not inflammed, nonerythematous.  Throat: Oropharynx nonerythematous, no exudate appreciated.   Neck: Supple, trachea midline.  Lungs:  Normal respiratory effort. Clear to auscultation BL without crackles or wheezes.  Heart: RRR. S1 and S2 normal without gallop, murmur, or rubs.  Abdomen:  Soft, NTND, bowel sounds present  Extremities: No pretibial edema.  Neurologic: Right arm weakness  Skin: No visible rashes, scars.    Lab results: Basic Metabolic Panel:  Recent Labs Lab 05/11/16 0422 05/12/16 0421 05/13/16 0413  NA 136 138 142  K 4.3  4.4 3.5 3.7  CL 104 110 114*  CO2 24 22 20*  GLUCOSE 136* 103* 84  BUN 24* 29* 26*  CREATININE 2.30* 3.01* 3.05*  CALCIUM 8.3* 7.7* 8.1*  MG 2.0  --  2.1  PHOS 3.1  --  3.5    Liver Function Tests:  Recent Labs Lab 05/10/16 1934 05/11/16 0422  AST 35 42*  ALT 28 29  ALKPHOS 57 39  BILITOT 0.7 0.8  PROT 7.8 6.5  ALBUMIN 4.2 3.7   No results for input(s): LIPASE, AMYLASE in the last 168 hours. No results for input(s): AMMONIA in the last 168 hours.  CBC:  Recent Labs Lab 05/10/16 2024 05/10/16 2100 05/11/16 0422 05/12/16 0421 05/13/16 0413  WBC 4.4 10.5 14.7* 10.0 9.2  NEUTROABS  --  9.5*  --   --   --   HGB 5.1* 12.7* 11.6* 10.9* 12.0*  HCT 15.7* 38.6* 34.5*  32.3* 35.6*  MCV 87.5 85.6 84.9 84.3 84.3  PLT 129* 286 257 234 239    Cardiac Enzymes:  Recent Labs Lab 05/10/16 1934  CKTOTAL 366  TROPONINI <0.03    BNP: Invalid input(s): POCBNP  CBG:  Recent Labs Lab 05/13/16 2051 05/14/16 0044 05/14/16 0502 05/14/16 0724 05/14/16 1128  GLUCAP 87 108* 103* 102* 112*    Microbiology: Results for orders placed or performed during the hospital encounter of 05/10/16  Culture, blood (routine x 2)     Status: Abnormal (Preliminary result)   Collection Time: 05/10/16  7:52 PM  Result Value Ref Range Status   Specimen Description BLOOD LT [facility]  Final   Special Requests   Final    BOTTLES DRAWN AEROBIC AND ANAEROBIC AER10CC, ANA10CC   Culture  Setup Time   Final    GRAM POSITIVE COCCI AEROBIC BOTTLE ONLY CRITICAL RESULT CALLED TO, READ BACK BY AND VERIFIED WITH: FHEMA HALLAJI 05/11/16 @ 1812  MLK    Culture (A)  Final    STAPHYLOCOCCUS SPECIES (COAGULASE NEGATIVE) THE SIGNIFICANCE OF ISOLATING THIS ORGANISM FROM A SINGLE SET OF BLOOD CULTURES WHEN MULTIPLE SETS ARE DRAWN IS UNCERTAIN. PLEASE NOTIFY THE MICROBIOLOGY DEPARTMENT WITHIN ONE WEEK IF SPECIATION AND SENSITIVITIES ARE REQUIRED. CULTURE REINCUBATED FOR BETTER GROWTH Performed at [facility]    Report Status PENDING  Incomplete  Culture, blood (routine x 2)     Status: None (Preliminary result)   Collection Time: 05/10/16  7:52 PM  Result Value Ref Range Status   Specimen Description BLOOD LEFT HAND  Final   Special Requests   Final    BOTTLES DRAWN AEROBIC AND ANAEROBIC AER  ANA   Culture NO GROWTH 4 DAYS  Final   Report Status PENDING  Incomplete  Blood Culture ID Panel (Reflexed)     Status: Abnormal   Collection Time: 05/10/16  7:52 PM  Result Value Ref Range Status   Enterococcus species NOT DETECTED NOT DETECTED Final   Listeria monocytogenes NOT DETECTED NOT DETECTED Final   Staphylococcus species DETECTED (A) NOT DETECTED Final    Comment:  CRITICAL RESULT CALLED TO, READ BACK BY AND VERIFIED WITH: FHEMA HALLAJI 05/11/16 @ 1812   MLK    Staphylococcus aureus NOT DETECTED NOT DETECTED Final   Methicillin resistance NOT DETECTED NOT DETECTED Final   Streptococcus species NOT DETECTED NOT DETECTED Final   Streptococcus agalactiae NOT DETECTED NOT DETECTED Final   Streptococcus pneumoniae NOT DETECTED NOT DETECTED Final   Streptococcus pyogenes NOT DETECTED NOT DETECTED Final   Acinetobacter baumannii NOT DETECTED NOT DETECTED Final   Enterobacteriaceae species NOT DETECTED NOT DETECTED Final   Enterobacter cloacae complex NOT DETECTED NOT DETECTED Final   Escherichia coli NOT DETECTED NOT DETECTED Final   Klebsiella oxytoca NOT DETECTED NOT DETECTED Final   Klebsiella pneumoniae NOT DETECTED NOT DETECTED Final   Proteus species NOT DETECTED NOT DETECTED Final   Serratia marcescens NOT DETECTED NOT DETECTED Final   Haemophilus influenzae NOT DETECTED NOT DETECTED Final   Neisseria meningitidis NOT DETECTED NOT DETECTED Final   Pseudomonas aeruginosa NOT DETECTED NOT DETECTED Final   Candida albicans NOT DETECTED NOT DETECTED Final   Candida glabrata NOT DETECTED NOT DETECTED Final   Candida krusei NOT DETECTED NOT DETECTED Final   Candida parapsilosis NOT DETECTED NOT DETECTED Final   Candida tropicalis NOT DETECTED NOT DETECTED Final  MRSA PCR Screening     Status: None   Collection Time: 05/10/16 11:02 PM  Result Value Ref Range Status   MRSA by PCR NEGATIVE NEGATIVE Final    Comment:        The GeneXpert MRSA Assay (FDA approved for NASAL specimens only), is one component of a comprehensive MRSA colonization surveillance program. It is not intended to diagnose MRSA infection nor to guide or monitor treatment for MRSA infections.   C difficile quick scan w PCR reflex     Status: None   Collection Time: 05/11/16  4:33 AM  Result Value Ref Range Status   C Diff antigen NEGATIVE NEGATIVE Final   C Diff toxin  NEGATIVE NEGATIVE Final   C Diff interpretation No C. difficile detected.  Final  Urine culture     Status: None   Collection Time: 05/11/16  6:35 AM  Result Value Ref Range Status   Specimen Description URINE, CATHETERIZED  Final   Special Requests NONE  Final   Culture NO GROWTH Performed at [facility]   Final   Report Status 05/12/2016 FINAL  Final    Coagulation Studies: No results for input(s): LABPROT, INR in the last 72 hours.  Urinalysis: No results for input(s): COLORURINE, LABSPEC, PHURINE, GLUCOSEU, HGBUR, BILIRUBINUR, KETONESUR, PROTEINUR, UROBILINOGEN, NITRITE, LEUKOCYTESUR in the last 72 hours.  Invalid input(s): APPERANCEUR    Imaging:  No results found.   Assessment & Plan: Pt is a 60 y.o. male with a PMHx of Hypertension, hyperlipidemia, CVA with residual right-sided weakness, who was admitted to [facility] on 05/10/2016 for evaluation of loss of consciousness and abdominal pain.   1.  Acute renal failure, likely related to hypovolemia upon admission. Baseline Cr 1.0 2.  Anemia not otherwise specified. 3.  Bilateral inguinal hernias 4.  Small bowel obstruction, improving clinically.  Plan:  Patient seen and evaluated at bedside. He appears to have acute renal failure which is likely related to his presenting illness with the nausea, vomiting, and hypovolemia. Unfortunately his renal function has deteriorated over the course of the hospital's edition despite IV fluid hydration. We will obtain renal ultrasound to make sure there is no developing obstruction though this wasn't entirely evident on the initial CT scan. The CT scan was performed without contrast. For now continue IV fluid hydration and monitor renal function daily. Avoid nephrotoxins as possible. Thanks for consultation.

## 2016-05-14 NOTE — Progress Notes (Addendum)
[employer] - [place] at [facility]   PATIENT NAME: Nelva Naynthony Widrig    MR#:  [id number]  DATE OF BIRTH:  01/06/1956  SUBJECTIVE:  CHIEF COMPLAINT:   Chief Complaint  Patient presents with  . Loss of Consciousness  . Abdominal Pain   Abdominal gas and discomfort, diarrhea. REVIEW OF SYSTEMS:  Review of Systems  Constitutional: Positive for malaise/fatigue. Negative for chills and fever.  HENT: Negative for congestion.   Eyes: Negative for blurred vision and double vision.  Respiratory: Negative for cough, shortness of breath and stridor.   Cardiovascular: Negative for chest pain and leg swelling.  Gastrointestinal: Positive for diarrhea. Negative for abdominal pain, blood in stool, melena, nausea and vomiting.  Genitourinary: Negative for dysuria, hematuria and urgency.  Musculoskeletal: Negative for joint pain.  Skin: Negative for itching and rash.  Neurological: Negative for dizziness, focal weakness and loss of consciousness.  Psychiatric/Behavioral: Negative for depression. The patient is not nervous/anxious.     DRUG ALLERGIES:  No Known Allergies VITALS:  Blood pressure 136/88, pulse 91, temperature 98.6 F (37 C), temperature source Oral, resp. rate 17, height 6\' 3"  (1.905 m), weight 206 lb 9.1 oz (93.7 kg), SpO2 98 %. PHYSICAL EXAMINATION:  Physical Exam  Constitutional: He is oriented to person, place, and time and well-developed, well-nourished, and in no distress.  HENT:  Head: Normocephalic.  Mouth/Throat: Oropharynx is clear and moist.  Eyes: Conjunctivae and EOM are normal.  Neck: Normal range of motion. Neck supple. No JVD present. No tracheal deviation present.  Cardiovascular: Normal rate, regular rhythm and normal heart sounds.  Exam reveals no gallop.   No murmur heard. Pulmonary/Chest: Effort normal and breath sounds normal. No respiratory distress. He has no wheezes.  Abdominal: Soft. Bowel sounds are normal. He exhibits no  distension. There is no tenderness.  Musculoskeletal: Normal range of motion. He exhibits no edema or tenderness.  Neurological: He is alert and oriented to person, place, and time. No cranial nerve deficit.  Skin: No rash noted. No erythema.  Psychiatric: Affect normal.   LABORATORY PANEL:   CBC  Recent Labs Lab 05/13/16 0413  WBC 9.2  HGB 12.0*  HCT 35.6*  PLT 239   ------------------------------------------------------------------------------------------------------------------ Chemistries   Recent Labs Lab 05/11/16 0422  05/13/16 0413  NA 136  < > 142  K 4.3  4.4  < > 3.7  CL 104  < > 114*  CO2 24  < > 20*  GLUCOSE 136*  < > 84  BUN 24*  < > 26*  CREATININE 2.30*  < > 3.05*  CALCIUM 8.3*  < > 8.1*  MG 2.0  --  2.1  AST 42*  --   --   ALT 29  --   --   ALKPHOS 39  --   --   BILITOT 0.8  --   --   < > = values in this interval not displayed. RADIOLOGY:  No results found. ASSESSMENT AND PLAN:   Septic shock. Improved with cipro and flagyl.  SBO. Improved, no surgery per surgeon.  ARF. Continue IVF, f/u BMP and Dr. Cherylann RatelLateef.  Right sided CVA, and Hyperlipidemia. Continue ASA and lipitor.  HTN. Normal BP without HTN meds.   All the records are reviewed and case discussed with Care Management/Social Worker. Management plans discussed with the patient, his wife and they are in agreement.  CODE STATUS: full code.  TOTAL TIME TAKING CARE OF THIS PATIENT: 35 minutes.   More than  50% of the time was spent in counseling/coordination of care: YES  POSSIBLE D/C IN 2 DAYS, DEPENDING ON CLINICAL CONDITION.   Shaune Pollackhen, Linzee Depaul M.D on 05/14/2016 at 4:22 PM  Between 7am to 6pm - Pager - [phone]  After 6pm go to [email or web address] - Social research officer, governmentpassword EPAS [email or web address]  [facility] [place] [facility]  Office  [phone]  CC: Primary care physician; Baruch GoutyMelinda Lada, MD  Note: This dictation was prepared with Dragon dictation along with smaller phrase technology. Any  transcriptional errors that result from this process are unintentional.

## 2016-05-15 ENCOUNTER — Inpatient Hospital Stay: Payer: BLUE CROSS/BLUE SHIELD

## 2016-05-15 LAB — BASIC METABOLIC PANEL
Anion gap: 6 (ref 5–15)
BUN: 23 mg/dL — ABNORMAL HIGH (ref 6–20)
CALCIUM: 7.9 mg/dL — AB (ref 8.9–10.3)
CO2: 22 mmol/L (ref 22–32)
CREATININE: 2.45 mg/dL — AB (ref 0.61–1.24)
Chloride: 114 mmol/L — ABNORMAL HIGH (ref 101–111)
GFR calc non Af Amer: 27 mL/min — ABNORMAL LOW (ref >60)
GFR, EST AFRICAN AMERICAN: 31 mL/min — AB (ref >60)
GLUCOSE: 125 mg/dL — AB (ref 65–99)
Potassium: 3.5 mmol/L (ref 3.5–5.1)
Sodium: 142 mmol/L (ref 135–145)

## 2016-05-15 LAB — CULTURE, BLOOD (ROUTINE X 2): Culture: NO GROWTH

## 2016-05-15 LAB — GLUCOSE, CAPILLARY
Glucose-Capillary: 120 mg/dL — ABNORMAL HIGH (ref 65–99)
Glucose-Capillary: 122 mg/dL — ABNORMAL HIGH (ref 65–99)
Glucose-Capillary: 124 mg/dL — ABNORMAL HIGH (ref 65–99)
Glucose-Capillary: 89 mg/dL (ref 65–99)

## 2016-05-15 MED ORDER — CIPROFLOXACIN HCL 500 MG PO TABS: 500.0000 mg | ORAL_TABLET | Freq: Two times a day (BID) | ORAL | 0 refills | Status: DC

## 2016-05-15 NOTE — Progress Notes (Signed)
Pt HR up in the 150's with SVT's per Telemetry clerk. On assessment Pt on the phone with wife talking and laughing. Pt asymptomatic noting he was feeling good.

## 2016-05-15 NOTE — Progress Notes (Signed)
[facility]  ROUNDING NOTE   Subjective:  Patient had episode of SVT earlier in the day. Pulse appears regular at the moment. Overall patient feeling better. Creatinine down to 2.4.   Objective:  Vital signs in last 24 hours:  Temp:  [98.6 F (37 C)-99 F (37.2 C)] 99 F (37.2 C) (12/23 0511) Pulse Rate:  [91-94] 92 (12/23 0511) Resp:  [17-20] 20 (12/23 0511) BP: (135-142)/(86-89) 135/89 (12/23 0511) SpO2:  [94 %-98 %] 94 % (12/23 0511)  Weight change:  Filed Weights   05/11/16 0400 05/12/16 0500 05/13/16 0401  Weight: 97.5 kg (214 lb 15.2 oz) 98.4 kg (216 lb 14.9 oz) 93.7 kg (206 lb 9.1 oz)    Intake/Output: I/O last 3 completed shifts: In: 4190.6 [I.V.:4190.6] Out: 2450 [Urine:2450]   Intake/Output this shift:  No intake/output data recorded.  Physical Exam: General: No acute distress  Head: Normocephalic, atraumatic. Moist oral mucosal membranes  Eyes: Anicteric  Neck: Supple, trachea midline  Lungs:  Clear to auscultation, normal effort  Heart: S1S2 no rubs  Abdomen:  Soft, nontender,   Extremities: 1+ peripheral edema.  Neurologic: Nonfocal, moving all four extremities  Skin: No lesions       Basic Metabolic Panel:  Recent Labs Lab 05/10/16 1934 05/11/16 0422 05/12/16 0421 05/13/16 0413 05/15/16 0405  NA 133* 136 138 142 142  K 2.6* 4.3  4.4 3.5 3.7 3.5  CL 96* 104 110 114* 114*  CO2 23 24 22  20* 22  GLUCOSE 233* 136* 103* 84 125*  BUN 19 24* 29* 26* 23*  CREATININE 2.09* 2.30* 3.01* 3.05* 2.45*  CALCIUM 9.7 8.3* 7.7* 8.1* 7.9*  MG  --  2.0  --  2.1  --   PHOS  --  3.1  --  3.5  --     Liver Function Tests:  Recent Labs Lab 05/10/16 1934 05/11/16 0422  AST 35 42*  ALT 28 29  ALKPHOS 57 39  BILITOT 0.7 0.8  PROT 7.8 6.5  ALBUMIN 4.2 3.7   No results for input(s): LIPASE, AMYLASE in the last 168 hours. No results for input(s): AMMONIA in the last 168 hours.  CBC:  Recent Labs Lab 05/10/16 2024 05/10/16 2100  05/11/16 0422 05/12/16 0421 05/13/16 0413  WBC 4.4 10.5 14.7* 10.0 9.2  NEUTROABS  --  9.5*  --   --   --   HGB 5.1* 12.7* 11.6* 10.9* 12.0*  HCT 15.7* 38.6* 34.5* 32.3* 35.6*  MCV 87.5 85.6 84.9 84.3 84.3  PLT 129* 286 257 234 239    Cardiac Enzymes:  Recent Labs Lab 05/10/16 1934  CKTOTAL 366  TROPONINI <0.03    BNP: Invalid input(s): POCBNP  CBG:  Recent Labs Lab 05/14/16 1953 05/15/16 0059 05/15/16 0507 05/15/16 0738 05/15/16 1147  GLUCAP 136* 124* 120* 122* 89    Microbiology: Results for orders placed or performed during the hospital encounter of 05/10/16  Culture, blood (routine x 2)     Status: Abnormal   Collection Time: 05/10/16  7:52 PM  Result Value Ref Range Status   Specimen Description BLOOD LT [facility]  Final   Special Requests   Final    BOTTLES DRAWN AEROBIC AND ANAEROBIC AER10CC, ANA10CC   Culture  Setup Time   Final    GRAM POSITIVE COCCI IN BOTH AEROBIC AND ANAEROBIC BOTTLES CRITICAL RESULT CALLED TO, READ BACK BY AND VERIFIED WITH: FHEMA HALLAJI 05/11/16 @ 1812  MLK    Culture (A)  Final  STAPHYLOCOCCUS SPECIES (COAGULASE NEGATIVE) THE SIGNIFICANCE OF ISOLATING THIS ORGANISM FROM A SINGLE SET OF BLOOD CULTURES WHEN MULTIPLE SETS ARE DRAWN IS UNCERTAIN. PLEASE NOTIFY THE MICROBIOLOGY DEPARTMENT WITHIN ONE WEEK IF SPECIATION AND SENSITIVITIES ARE REQUIRED. Performed at [facility]    Report Status 05/15/2016 FINAL  Final  Culture, blood (routine x 2)     Status: None   Collection Time: 05/10/16  7:52 PM  Result Value Ref Range Status   Specimen Description BLOOD LEFT HAND  Final   Special Requests   Final    BOTTLES DRAWN AEROBIC AND ANAEROBIC AER 11ML ANA 11ML   Culture NO GROWTH 5 DAYS  Final   Report Status 05/15/2016 FINAL  Final  Blood Culture ID Panel (Reflexed)     Status: Abnormal   Collection Time: 05/10/16  7:52 PM  Result Value Ref Range Status   Enterococcus species NOT DETECTED NOT DETECTED Final   Listeria  monocytogenes NOT DETECTED NOT DETECTED Final   Staphylococcus species DETECTED (A) NOT DETECTED Final    Comment: CRITICAL RESULT CALLED TO, READ BACK BY AND VERIFIED WITH: FHEMA HALLAJI 05/11/16 @ 1812   MLK    Staphylococcus aureus NOT DETECTED NOT DETECTED Final   Methicillin resistance NOT DETECTED NOT DETECTED Final   Streptococcus species NOT DETECTED NOT DETECTED Final   Streptococcus agalactiae NOT DETECTED NOT DETECTED Final   Streptococcus pneumoniae NOT DETECTED NOT DETECTED Final   Streptococcus pyogenes NOT DETECTED NOT DETECTED Final   Acinetobacter baumannii NOT DETECTED NOT DETECTED Final   Enterobacteriaceae species NOT DETECTED NOT DETECTED Final   Enterobacter cloacae complex NOT DETECTED NOT DETECTED Final   Escherichia coli NOT DETECTED NOT DETECTED Final   Klebsiella oxytoca NOT DETECTED NOT DETECTED Final   Klebsiella pneumoniae NOT DETECTED NOT DETECTED Final   Proteus species NOT DETECTED NOT DETECTED Final   Serratia marcescens NOT DETECTED NOT DETECTED Final   Haemophilus influenzae NOT DETECTED NOT DETECTED Final   Neisseria meningitidis NOT DETECTED NOT DETECTED Final   Pseudomonas aeruginosa NOT DETECTED NOT DETECTED Final   Candida albicans NOT DETECTED NOT DETECTED Final   Candida glabrata NOT DETECTED NOT DETECTED Final   Candida krusei NOT DETECTED NOT DETECTED Final   Candida parapsilosis NOT DETECTED NOT DETECTED Final   Candida tropicalis NOT DETECTED NOT DETECTED Final  MRSA PCR Screening     Status: None   Collection Time: 05/10/16 11:02 PM  Result Value Ref Range Status   MRSA by PCR NEGATIVE NEGATIVE Final    Comment:        The GeneXpert MRSA Assay (FDA approved for NASAL specimens only), is one component of a comprehensive MRSA colonization surveillance program. It is not intended to diagnose MRSA infection nor to guide or monitor treatment for MRSA infections.   C difficile quick scan w PCR reflex     Status: None   Collection  Time: 05/11/16  4:33 AM  Result Value Ref Range Status   C Diff antigen NEGATIVE NEGATIVE Final   C Diff toxin NEGATIVE NEGATIVE Final   C Diff interpretation No C. difficile detected.  Final  Urine culture     Status: None   Collection Time: 05/11/16  6:35 AM  Result Value Ref Range Status   Specimen Description URINE, CATHETERIZED  Final   Special Requests NONE  Final   Culture NO GROWTH [facility]   Final   Report Status 05/12/2016 FINAL  Final    Coagulation Studies: No  results for input(s): LABPROT, INR in the last 72 hours.  Urinalysis: No results for input(s): COLORURINE, LABSPEC, PHURINE, GLUCOSEU, HGBUR, BILIRUBINUR, KETONESUR, PROTEINUR, UROBILINOGEN, NITRITE, LEUKOCYTESUR in the last 72 hours.  Invalid input(s): APPERANCEUR    Imaging: [place] Renal  Result Date: 05/15/2016 CLINICAL DATA:  Acute renal failure EXAM: RENAL / URINARY TRACT ULTRASOUND COMPLETE COMPARISON:  CT abdomen/ pelvis dated 05/10/2016 FINDINGS: Right Kidney: Length: 10.6 cm. 2.0 x 2.0 x 2.2 cm mildly complex cyst in the interpolar region. 1.8 x 2.0 x 1.8 cm mildly complex cyst in the upper pole. Neither demonstrate simple fluid density, and may be hemorrhagic. No hydronephrosis. Left Kidney: Length: 11.4 cm. 2.0 x 1.9 x 1.5 cm simple cyst in the interpolar region. 2.2 x 2.3 x 2.0 cm complex cyst in the upper pole, not demonstrating simple fluid density, possibly hemorrhagic. No hydronephrosis. Bladder: Within normal limits. IMPRESSION: No hydronephrosis. Multiple bilateral renal cysts, some of which do demonstrate simple fluid density, possibly hemorrhagic. Electronically Signed   By: Charline BillsSriyesh  Krishnan M.D.   On: 05/15/2016 10:55     Medications:   . sodium chloride 125 mL/hr at 05/15/16 0416   . aspirin EC  325 mg Oral Daily  . atorvastatin  80 mg Oral q1800  . ciprofloxacin  500 mg Oral BID  . feeding supplement (ENSURE ENLIVE)  237 mL Oral BID BM  . heparin subcutaneous   5,000 Units Subcutaneous Q8H  . insulin aspart  0-9 Units Subcutaneous Q4H  . lidocaine (PF)  30 mL Other Once  . metroNIDAZOLE  500 mg Oral Q8H  . multivitamin with minerals  1 tablet Oral Daily  . omega-3 acid ethyl esters  1 g Oral Daily  . sodium chloride flush  10-40 mL Intracatheter Q12H   sodium chloride, acetaminophen, alum & mag hydroxide-simeth, ipratropium-albuterol, morphine injection, ondansetron (ZOFRAN) IV, sodium chloride flush  Assessment/ Plan:  60 y.o. male with a PMHx of Hypertension, hyperlipidemia, CVA with residual right-sided weakness, who was [facility] 05/10/2016 for evaluation of loss of consciousness and abdominal pain.   1.  Acute renal failure, likely related to hypovolemia upon admission. Baseline Cr 1.0 2.  Anemia not otherwise specified. 3.  Bilateral inguinal hernias 4.  Small bowel obstruction, improving clinically. 5.  Bilateral renal cysts, some of which are complex.   Plan:  Renal function has improved. Creatinine down to 2.45.  Continue IV fluid hydration with 0.9 normal saline at the moment. Avoid nephrotoxins as possible including NSAIDs. It appears that his mild small bowel obstruction appears to have improved significantly. Patient did have an episode of SVT this a.m. Defer management of this to hospitalist. Renal ultrasound was reviewed and he was found to have bilateral renal cysts, some of which were slightly complex.  He will likely need reimaging of these renal cysts at some point in time.  LOS: 5 Maddalynn Barnard 12/23/201712:23 PM

## 2016-05-15 NOTE — Discharge Instructions (Signed)
Heart healthy diet

## 2016-05-15 NOTE — Progress Notes (Signed)
Patient discharged this shift via w/c by staff; denies pain; discharge instructions as ordered; voices understanding; denies any pain or distress at this time

## 2016-05-15 NOTE — Discharge Summary (Signed)
[facility] - [place] at [facility]   PATIENT NAME: [name]    MR#:  [id number]  DATE OF BIRTH:  01/02/1956  DATE OF ADMISSION:  05/10/2016   ADMITTING PHYSICIAN: Erin FullingKurian Kasa, MD  DATE OF DISCHARGE: 05/15/2016  PRIMARY CARE PHYSICIAN: Baruch GoutyMelinda Lada, MD   ADMISSION DIAGNOSIS:  Hypokalemia [E87.6] Acute kidney injury (HCC) [N17.9] Hypotension [I95.9] Hypotension, unspecified hypotension type [I95.9] DISCHARGE DIAGNOSIS:  Active Problems:   SBO (small bowel obstruction)   Acute kidney injury (HCC)   Hypotension Septic shock with bacteremia SECONDARY DIAGNOSIS:   Past Medical History:  Diagnosis Date  . Hyperlipidemia   . Hypertension   . Stroke [facility](HCC)    HOSPITAL COURSE:  Septic shock with bacteremia. Improved with cipro and flagyl. Continue cipro for 9 more days.  SBO. Improved, no surgery per surgeon.  ARF. Improving with IVF, f/u BMP and Dr. Cherylann RatelLateef as outpatient. Renal US: no obstruction but cysts.  Right sided CVA, and Hyperlipidemia. Continue ASA and lipitor.  HTN. Normal BP without HTN meds. Discontinued home chlorthalidone and lisinopril.  DISCHARGE CONDITIONS:  Stable, discharge to home today. CONSULTS OBTAINED:  Treatment Team:  Mady HaagensenMunsoor Lateef, MD DRUG ALLERGIES:  No Known Allergies DISCHARGE MEDICATIONS:   Allergies as of 05/15/2016   No Known Allergies     Medication List    STOP taking these medications   chlorthalidone 50 MG tablet Commonly known as:  HYGROTON   lisinopril 40 MG tablet Commonly known as:  PRINIVIL,ZESTRIL     TAKE these medications   aspirin EC 325 MG tablet Take 1 tablet (325 mg total) by mouth daily.   atorvastatin 80 MG tablet Commonly known as:  LIPITOR Take 1 tablet (80 mg total) by mouth daily. (new cholesterol medicine, stop the pravastatin) What changed:  when to take this  additional instructions   ciprofloxacin 500 MG tablet Commonly known as:  CIPRO Take 1 tablet (500  mg total) by mouth 2 (two) times daily.   Fish Oil 1000 MG Caps Take 1,000 mg by mouth daily.   ONE-A-DAY [employer] FORMULA Tabs Take 1 tablet by mouth daily.        DISCHARGE INSTRUCTIONS:  See AVS.  If you experience worsening of your admission symptoms, develop shortness of breath, life threatening emergency, suicidal or homicidal thoughts you must seek medical attention immediately by calling 911 or calling your MD immediately  if symptoms less severe.  You Must read complete instructions/literature along with all the possible adverse reactions/side effects for all the Medicines you take and that have been prescribed to you. Take any new Medicines after you have completely understood and accpet all the possible adverse reactions/side effects.   Please note  You were cared for by a hospitalist during your hospital stay. If you have any questions about your discharge medications or the care you received while you were in the hospital after you are discharged, you can call the unit and asked to speak with the hospitalist on call if the hospitalist that took care of you is not available. Once you are discharged, your primary care physician will handle any further medical issues. Please note that NO REFILLS for any discharge medications will be authorized once you are discharged, as it is imperative that you return to your primary care physician (or establish a relationship with a primary care physician if you do not have one) for your aftercare needs so that they can reassess your need for medications and monitor your lab  values.    On the day of Discharge:  VITAL SIGNS:  Blood pressure 135/89, pulse 92, temperature 99 F (37.2 C), temperature source Oral, resp. rate 20, height 6\' 3"  (1.905 m), weight 206 lb 9.1 oz (93.7 kg), SpO2 94 %. PHYSICAL EXAMINATION:  GENERAL:  60 y.o.-year-old patient lying in the bed with no acute distress.  EYES: Pupils equal, round, reactive to light and  accommodation. No scleral icterus. Extraocular muscles intact.  HEENT: Head atraumatic, normocephalic. Oropharynx and nasopharynx clear.  NECK:  Supple, no jugular venous distention. No thyroid enlargement, no tenderness.  LUNGS: Normal breath sounds bilaterally, no wheezing, rales,rhonchi or crepitation. No use of accessory muscles of respiration.  CARDIOVASCULAR: S1, S2 normal. No murmurs, rubs, or gallops.  ABDOMEN: Soft, non-tender, non-distended. Bowel sounds present. No organomegaly or mass.  EXTREMITIES: No pedal edema, cyanosis, or clubbing.  NEUROLOGIC: Cranial nerves II through XII are intact. Muscle strength 5/5 in all extremities. Sensation intact. Gait not checked.  PSYCHIATRIC: The patient is alert and oriented x 3.  SKIN: No obvious rash, lesion, or ulcer.  DATA REVIEW:   CBC  Recent Labs Lab 05/13/16 0413  WBC 9.2  HGB 12.0*  HCT 35.6*  PLT 239    Chemistries   Recent Labs Lab 05/11/16 0422  05/13/16 0413 05/15/16 0405  NA 136  < > 142 142  K 4.3  4.4  < > 3.7 3.5  CL 104  < > 114* 114*  CO2 24  < > 20* 22  GLUCOSE 136*  < > 84 125*  BUN 24*  < > 26* 23*  CREATININE 2.30*  < > 3.05* 2.45*  CALCIUM 8.3*  < > 8.1* 7.9*  MG 2.0  --  2.1  --   AST 42*  --   --   --   ALT 29  --   --   --   ALKPHOS 39  --   --   --   BILITOT 0.8  --   --   --   < > = values in this interval not displayed.   Microbiology Results  Results for orders placed or performed during the hospital encounter of 05/10/16  Culture, blood (routine x 2)     Status: Abnormal   Collection Time: 05/10/16  7:52 PM  Result Value Ref Range Status   Specimen Description BLOOD LT [facility]  Final   Special Requests   Final    BOTTLES DRAWN AEROBIC AND ANAEROBIC AER10CC, ANA10CC   Culture  Setup Time   Final    GRAM POSITIVE COCCI IN BOTH AEROBIC AND ANAEROBIC BOTTLES CRITICAL RESULT CALLED TO, READ BACK BY AND VERIFIED WITH: FHEMA HALLAJI 05/11/16 @ 1812  MLK    Culture (A)  Final     STAPHYLOCOCCUS SPECIES (COAGULASE NEGATIVE) THE SIGNIFICANCE OF ISOLATING THIS ORGANISM FROM A SINGLE SET OF BLOOD CULTURES WHEN MULTIPLE SETS ARE DRAWN IS UNCERTAIN. PLEASE NOTIFY THE MICROBIOLOGY DEPARTMENT WITHIN ONE WEEK IF SPECIATION AND SENSITIVITIES ARE REQUIRED. Performed at [facility]    Report Status 05/15/2016 FINAL  Final  Culture, blood (routine x 2)     Status: None   Collection Time: 05/10/16  7:52 PM  Result Value Ref Range Status   Specimen Description BLOOD LEFT HAND  Final   Special Requests   Final    BOTTLES DRAWN AEROBIC AND ANAEROBIC AER ANA   Culture NO GROWTH 5 DAYS  Final   Report Status 05/15/2016 FINAL  Final  Blood Culture ID Panel (Reflexed)     Status: Abnormal   Collection Time: 05/10/16  7:52 PM  Result Value Ref Range Status   Enterococcus species NOT DETECTED NOT DETECTED Final   Listeria monocytogenes NOT DETECTED NOT DETECTED Final   Staphylococcus species DETECTED (A) NOT DETECTED Final    Comment: CRITICAL RESULT CALLED TO, READ BACK BY AND VERIFIED WITH: FHEMA HALLAJI 05/11/16 @ 1812   MLK    Staphylococcus aureus NOT DETECTED NOT DETECTED Final   Methicillin resistance NOT DETECTED NOT DETECTED Final   Streptococcus species NOT DETECTED NOT DETECTED Final   Streptococcus agalactiae NOT DETECTED NOT DETECTED Final   Streptococcus pneumoniae NOT DETECTED NOT DETECTED Final   Streptococcus pyogenes NOT DETECTED NOT DETECTED Final   Acinetobacter baumannii NOT DETECTED NOT DETECTED Final   Enterobacteriaceae species NOT DETECTED NOT DETECTED Final   Enterobacter cloacae complex NOT DETECTED NOT DETECTED Final   Escherichia coli NOT DETECTED NOT DETECTED Final   Klebsiella oxytoca NOT DETECTED NOT DETECTED Final   Klebsiella pneumoniae NOT DETECTED NOT DETECTED Final   Proteus species NOT DETECTED NOT DETECTED Final   Serratia marcescens NOT DETECTED NOT DETECTED Final   Haemophilus influenzae NOT DETECTED NOT DETECTED Final     Neisseria meningitidis NOT DETECTED NOT DETECTED Final   Pseudomonas aeruginosa NOT DETECTED NOT DETECTED Final   Candida albicans NOT DETECTED NOT DETECTED Final   Candida glabrata NOT DETECTED NOT DETECTED Final   Candida krusei NOT DETECTED NOT DETECTED Final   Candida parapsilosis NOT DETECTED NOT DETECTED Final   Candida tropicalis NOT DETECTED NOT DETECTED Final  MRSA PCR Screening     Status: None   Collection Time: 05/10/16 11:02 PM  Result Value Ref Range Status   MRSA by PCR NEGATIVE NEGATIVE Final    Comment:        The GeneXpert MRSA Assay (FDA approved for NASAL specimens only), is one component of a comprehensive MRSA colonization surveillance program. It is not intended to diagnose MRSA infection nor to guide or monitor treatment for MRSA infections.   C difficile quick scan w PCR reflex     Status: None   Collection Time: 05/11/16  4:33 AM  Result Value Ref Range Status   C Diff antigen NEGATIVE NEGATIVE Final   C Diff toxin NEGATIVE NEGATIVE Final   C Diff interpretation No C. difficile detected.  Final  Urine culture     Status: None   Collection Time: 05/11/16  6:35 AM  Result Value Ref Range Status   Specimen Description URINE, CATHETERIZED  Final   Special Requests NONE  Final   Culture NO GROWTH Performed at [facility]   Final   Report Status 05/12/2016 FINAL  Final    RADIOLOGY:  [place] Renal  Result Date: 05/15/2016 CLINICAL DATA:  Acute renal failure EXAM: RENAL / URINARY TRACT ULTRASOUND COMPLETE COMPARISON:  CT abdomen/ pelvis dated 05/10/2016 FINDINGS: Right Kidney: Length: 10.6 cm. 2.0 x 2.0 x 2.2 cm mildly complex cyst in the interpolar region. 1.8 x 2.0 x 1.8 cm mildly complex cyst in the upper pole. Neither demonstrate simple fluid density, and may be hemorrhagic. No hydronephrosis. Left Kidney: Length: 11.4 cm. 2.0 x 1.9 x 1.5 cm simple cyst in the interpolar region. 2.2 x 2.3 x 2.0 cm complex cyst in the upper pole, not  demonstrating simple fluid density, possibly hemorrhagic. No hydronephrosis. Bladder: Within normal limits. IMPRESSION: No hydronephrosis. Multiple bilateral renal cysts, some of which do demonstrate simple  fluid density, possibly hemorrhagic. Electronically Signed   By: Charline Bills M.D.   On: 05/15/2016 10:55     Management plans discussed with the patient, his wife and they are in agreement.  CODE STATUS:     Code Status Orders        Start     Ordered   05/10/16 2134  Full code  Continuous     05/10/16 2134    Code Status History    Date Active Date Inactive Code Status Order ID Comments User Context   12/27/2013  2:35 PM 01/17/2014  4:33 PM Full Code [id number]  Jacquelynn Cree, PA-C Inpatient    Advance Directive Documentation   Flowsheet Row Most Recent Value  Type of Advance Directive  Living will  Pre-existing out of facility DNR order (yellow form or pink MOST form)  No data  "MOST" Form in Place?  No data      TOTAL TIME TAKING CARE OF THIS PATIENT: 42 minutes.    Shaune Pollack M.D on 05/15/2016 at 1:02 PM  Between 7am to 6pm - Pager - [phone]  After 6pm go to [email or web address] - Social research officer, government  [facility]  Office  [phone]  CC: Primary care physician; Baruch Gouty, MD   Note: This dictation was prepared with Dragon dictation along with smaller phrase technology. Any transcriptional errors that result from this process are unintentional.

## 2016-05-18 LAB — PROTEIN ELECTRO, RANDOM URINE
ALBUMIN ELP UR: 100 %
ALPHA-1-GLOBULIN, U: 0 %
Alpha-2-Globulin, U: 0 %
Beta Globulin, U: 0 %
Gamma Globulin, U: 0 %
PDF: 0
TOTAL PROTEIN, URINE-UPE24: 10.7 mg/dL

## 2016-05-18 LAB — PROTEIN ELECTROPHORESIS, SERUM
A/G Ratio: 1 (ref 0.7–1.7)
ALBUMIN ELP: 2.7 g/dL — AB (ref 2.9–4.4)
ALPHA-1-GLOBULIN: 0.3 g/dL (ref 0.0–0.4)
Alpha-2-Globulin: 0.8 g/dL (ref 0.4–1.0)
Beta Globulin: 0.7 g/dL (ref 0.7–1.3)
GLOBULIN, TOTAL: 2.6 g/dL (ref 2.2–3.9)
Gamma Globulin: 0.8 g/dL (ref 0.4–1.8)
TOTAL PROTEIN ELP: 5.3 g/dL — AB (ref 6.0–8.5)

## 2016-05-19 LAB — CULTURE, BLOOD (ROUTINE X 2)

## 2016-05-20 ENCOUNTER — Ambulatory Visit (INDEPENDENT_AMBULATORY_CARE_PROVIDER_SITE_OTHER): Payer: BLUE CROSS/BLUE SHIELD | Admitting: Family Medicine

## 2016-05-20 ENCOUNTER — Encounter: Payer: Self-pay | Admitting: Family Medicine

## 2016-05-20 ENCOUNTER — Telehealth: Payer: Self-pay | Admitting: Physical Medicine & Rehabilitation

## 2016-05-20 DIAGNOSIS — K56609 Unspecified intestinal obstruction, unspecified as to partial versus complete obstruction: Secondary | ICD-10-CM

## 2016-05-20 DIAGNOSIS — N179 Acute kidney failure, unspecified: Secondary | ICD-10-CM

## 2016-05-20 DIAGNOSIS — I10 Essential (primary) hypertension: Secondary | ICD-10-CM | POA: Diagnosis not present

## 2016-05-20 DIAGNOSIS — I6523 Occlusion and stenosis of bilateral carotid arteries: Secondary | ICD-10-CM

## 2016-05-20 DIAGNOSIS — E785 Hyperlipidemia, unspecified: Secondary | ICD-10-CM | POA: Diagnosis not present

## 2016-05-20 DIAGNOSIS — K4020 Bilateral inguinal hernia, without obstruction or gangrene, not specified as recurrent: Secondary | ICD-10-CM

## 2016-05-20 DIAGNOSIS — I700 Atherosclerosis of aorta: Secondary | ICD-10-CM | POA: Insufficient documentation

## 2016-05-20 DIAGNOSIS — G8110 Spastic hemiplegia affecting unspecified side: Secondary | ICD-10-CM

## 2016-05-20 NOTE — Patient Instructions (Addendum)
Please do have fasting labs done or or shortly after June 09, 2016 for your cholesterol  We'll order the carotid scan We'll have you see the surgeon and kidney doctor Try to limit saturated fats in your diet (bologna, hot dogs, barbeque, cheeseburgers, hamburgers, steak, bacon, sausage, cheese, etc.) and get more fresh fruits, vegetables, and whole grains Try to follow the DASH guidelines (DASH stands for Dietary Approaches to Stop Hypertension) Try to limit the sodium in your diet.  Ideally, consume less than 1.5 grams (less than 1,500mg ) per day. Do not add salt when cooking or at the table.  Check the sodium amount on labels when shopping, and choose items lower in sodium when given a choice. Avoid or limit foods that already contain a lot of sodium. Eat a diet rich in fruits and vegetables and whole grains.  DASH Eating Plan DASH stands for "Dietary Approaches to Stop Hypertension." The DASH eating plan is a healthy eating plan that has been shown to reduce high blood pressure (hypertension). Additional health benefits may include reducing the risk of type 2 diabetes mellitus, heart disease, and stroke. The DASH eating plan may also help with weight loss. What do I need to know about the DASH eating plan? For the DASH eating plan, you will follow these general guidelines:  Choose foods with less than 150 milligrams of sodium per serving (as listed on the food label).  Use salt-free seasonings or herbs instead of table salt or sea salt.  Check with your health care provider or pharmacist before using salt substitutes.  Eat lower-sodium products. These are often labeled as "low-sodium" or "no salt added."  Eat fresh foods. Avoid eating a lot of canned foods.  Eat more vegetables, fruits, and low-fat dairy products.  Choose whole grains. Look for the word "whole" as the first word in the ingredient list.  Choose fish and skinless chicken or [place] more often than red meat. Limit fish,  poultry, and meat to 6 oz (170 g) each day.  Limit sweets, desserts, sugars, and sugary drinks.  Choose heart-healthy fats.  Eat more home-cooked food and less restaurant, buffet, and fast food.  Limit fried foods.  Do not fry foods. Cook foods using methods such as baking, boiling, grilling, and broiling instead.  When eating at a restaurant, ask that your food be prepared with less salt, or no salt if possible. What foods can I eat? Seek help from a dietitian for individual calorie needs. Grains  Whole grain or whole wheat bread. Brown rice. Whole grain or whole wheat pasta. Quinoa, bulgur, and whole grain cereals. Low-sodium cereals. Corn or whole wheat flour tortillas. Whole grain cornbread. Whole grain crackers. Low-sodium crackers. Vegetables  Fresh or frozen vegetables (raw, steamed, roasted, or grilled). Low-sodium or reduced-sodium tomato and vegetable juices. Low-sodium or reduced-sodium tomato sauce and paste. Low-sodium or reduced-sodium canned vegetables. Fruits  All fresh, canned (in natural juice), or frozen fruits. Meat and Other Protein Products  Ground beef (85% or leaner), grass-fed beef, or beef trimmed of fat. Skinless chicken or [place]. Ground chicken or [place]key. Pork trimmed of fat. All fish and seafood. Eggs. Dried beans, peas, or lentils. Unsalted nuts and seeds. Unsalted canned beans. Dairy  Low-fat dairy products, such as skim or 1% milk, 2% or reduced-fat cheeses, low-fat ricotta or cottage cheese, or plain low-fat yogurt. Low-sodium or reduced-sodium cheeses. Fats and Oils  Tub margarines without trans fats. Light or reduced-fat mayonnaise and salad dressings (reduced sodium). Avocado. Safflower, olive, or  canola oils. Natural peanut or almond butter. Other  Unsalted popcorn and pretzels. The items listed above may not be a complete list of recommended foods or beverages. Contact your dietitian for more options.  What foods are not recommended? Grains   White bread. White pasta. White rice. Refined cornbread. Bagels and croissants. Crackers that contain trans fat. Vegetables  Creamed or fried vegetables. Vegetables in a cheese sauce. Regular canned vegetables. Regular canned tomato sauce and paste. Regular tomato and vegetable juices. Fruits  Canned fruit in light or heavy syrup. Fruit juice. Meat and Other Protein Products  Fatty cuts of meat. Ribs, chicken wings, bacon, sausage, bologna, salami, chitterlings, fatback, hot dogs, bratwurst, and packaged luncheon meats. Salted nuts and seeds. Canned beans with salt. Dairy  Whole or 2% milk, cream, half-and-half, and cream cheese. Whole-fat or sweetened yogurt. Full-fat cheeses or blue cheese. Nondairy creamers and whipped toppings. Processed cheese, cheese spreads, or cheese curds. Condiments  Onion and garlic salt, seasoned salt, table salt, and sea salt. Canned and packaged gravies. Worcestershire sauce. Tartar sauce. Barbecue sauce. Teriyaki sauce. Soy sauce, including reduced sodium. Steak sauce. Fish sauce. Oyster sauce. Cocktail sauce. Horseradish. Ketchup and mustard. Meat flavorings and tenderizers. Bouillon cubes. Hot sauce. Tabasco sauce. Marinades. Taco seasonings. Relishes. Fats and Oils  Butter, stick margarine, lard, shortening, ghee, and bacon fat. Coconut, palm kernel, or palm oils. Regular salad dressings. Other  Pickles and olives. Salted popcorn and pretzels. The items listed above may not be a complete list of foods and beverages to avoid. Contact your dietitian for more information.  Where can I find more information? National Heart, Lung, and Blood Institute: CablePromo.itwww.nhlbi.nih.gov/health/health-topics/topics/dash/ This information is not intended to replace advice given to you by your health care provider. Make sure you discuss any questions you have with your health care provider. Document Released: 04/29/2011 Document Revised: 10/16/2015 Document Reviewed:  03/14/2013 Elsevier Interactive Patient Education  2017 [employer]

## 2016-05-20 NOTE — Assessment & Plan Note (Signed)
Resolved, no blood or mucous

## 2016-05-20 NOTE — Assessment & Plan Note (Signed)
Re-order carotid US; avoid saturated fats; continue statin and aspirin

## 2016-05-20 NOTE — Telephone Encounter (Signed)
Patients Mom dropped off FMLA documents for [place] - placed on desk for completion

## 2016-05-20 NOTE — Assessment & Plan Note (Signed)
Unchanged; pt to follow-up with neurologist in January

## 2016-05-20 NOTE — Assessment & Plan Note (Signed)
Goal LDL < 70; continue statin

## 2016-05-20 NOTE — Assessment & Plan Note (Addendum)
Staying hydrated; check BMP today; refer to Dr. Cherylann RatelLateef; continue to hold BP meds, monitor at home, will defer to nephrologist to restart

## 2016-05-20 NOTE — Assessment & Plan Note (Signed)
Controlled without any medicine; try DASH guidelines

## 2016-05-20 NOTE — Assessment & Plan Note (Signed)
Reviewed CT scan with him; showed model of atherosclerosis; discussed goal LDL under 70; continue statin

## 2016-05-20 NOTE — Progress Notes (Signed)
BP 134/82 (BP Location: Left Arm, Patient Position: Sitting, Cuff Size: Normal)   Pulse 89   Temp 98.3 F (36.8 C) (Oral)   Resp 14   Wt 229 lb 2 oz (103.9 kg)   SpO2 98%   BMI 28.64 kg/m    Subjective:    Patient ID: [name], male    DOB: 10/25/1955, 60 y.o.   MRN: [id number]  HPI: Thomas Naynthony Wotton is a 60 y.o. male  Chief Complaint  Patient presents with  . Follow-up    6 mnth and hospital f/u; patient was in icu due to stomach bacteria    Patient is here for 6 month visit but also for hospital follow-up He was admitted on 05/10/16, discharged on 05/15/16 Diagnosed with septic shock, bowel obstruction; did not need surgery Had acute renal failure Consults included surgeon and nephrologist We reviewed labs and CT scan He was found to have hernias (see CT report) and is supposed to see surgeon after discharge Some shortness of breath but just getting back into doing things; wife on on FMLA to help him No abdominal pain now Continue on the cipro He was taken off of his BP medicine because of his low blood pressures; he can monitor at home [facility], but patient does not have appt with kidney doctor yet He was supposed to f/u with general surgeon, but they do not have an appt there either He is going to get in to see neurologist for just a visit instead of botox injection in January  He had a carotid scan that was ordered in June or July but patient says he never heard about the test We reviewed his CT scan note about aortic atherosclerosis; he is tolerating statin  [facility] 05/20/2016 04/19/2016 01/12/2016 11/18/2015 10/06/2015  Decreased Interest 0 0 0 0 0  Down, Depressed, Hopeless 0 0 0 0 -  PHQ - 2 Score 0 0 0 0 0  Altered sleeping - - - - -  Tired, decreased energy - - - - -  Change in appetite - - - - -  Feeling bad or failure about yourself  - - - - -  Trouble concentrating - - - - -    Moving slowly or fidgety/restless - - - - -  Suicidal thoughts - - - - -  PHQ-9 Score - - - - -   Relevant past medical, surgical, family and social history reviewed Past Medical History:  Diagnosis Date  . Hyperlipidemia   . Hypertension   . Stroke [facility](HCC)    Past Surgical History:  Procedure Laterality Date  . KNEE ARTHROSCOPY Right    Family History  Problem Relation Age of Onset  . Hypertension Mother    Social History  Substance Use Topics  . Smoking status: Never Smoker  . Smokeless tobacco: Never Used  . Alcohol use No   Interim medical history since last visit reviewed. Allergies and medications reviewed  Review of Systems Per HPI unless specifically indicated above     Objective:    BP 134/82 (BP Location: Left Arm, Patient Position: Sitting, Cuff Size: Normal)   Pulse 89   Temp 98.3 F (36.8 C) (Oral)   Resp 14   Wt 229 lb 2 oz (103.9 kg)   SpO2 98%   BMI 28.64 kg/m   Wt Readings from Last 3 Encounters:  05/20/16 229 lb 2 oz (103.9 kg)  05/13/16 206 lb  9.1 oz (93.7 kg)  11/18/15 221 lb (100.2 kg)    Physical Exam  Constitutional: He appears well-developed and well-nourished. No distress.  Weight change noted, but patient does not appear fluid-overloaded  HENT:  Head: Normocephalic and atraumatic.  Eyes: EOM are normal. No scleral icterus.  Neck: Carotid bruit is not present. No thyromegaly present.  Central line site intact, no fluctuance, erythema, or drainage  Cardiovascular: Normal rate and regular rhythm.   Pulmonary/Chest: Effort normal and breath sounds normal.  Abdominal: Soft. Bowel sounds are normal. He exhibits no distension.  Musculoskeletal: He exhibits no edema.  Right AFO  Neurological: He is alert. He displays no tremor. Coordination normal.  Wearing right AFO; contracture of right elbow; grip 2/5 right hand  Skin: Skin is warm and dry. No pallor.  Psychiatric: He has a normal mood and affect. His behavior is normal. Judgment  and thought content normal. His mood appears not anxious. He does not exhibit a depressed mood.   Results for orders placed or performed during the hospital encounter of 05/10/16  Culture, blood (routine x 2)  Result Value Ref Range   Specimen Description BLOOD LT AC    Special Requests      BOTTLES DRAWN AEROBIC AND ANAEROBIC AER10CC, ANA10CC   Culture  Setup Time      GRAM POSITIVE COCCI IN BOTH AEROBIC AND ANAEROBIC BOTTLES CRITICAL RESULT CALLED TO, READ BACK BY AND VERIFIED WITH: FHEMA HALLAJI 05/11/16 @ 1812  MLK    Culture (A)     STAPHYLOCOCCUS SPECIES (COAGULASE NEGATIVE) THE SIGNIFICANCE OF ISOLATING THIS ORGANISM FROM A SINGLE SET OF BLOOD CULTURES WHEN MULTIPLE SETS ARE DRAWN IS UNCERTAIN. PLEASE NOTIFY THE MICROBIOLOGY DEPARTMENT WITHIN ONE WEEK IF SPECIATION AND SENSITIVITIES ARE REQUIRED. ANAEROBIC GRAM POSITIVE COCCI UNABLE TO IDENTIFY FURTHER DUE TO POOR VIABILITY Per[facility]    Report Status 05/19/2016 FINAL   Culture, blood (routine x 2)  Result Value Ref Range   Specimen Description BLOOD LEFT HAND    Special Requests      BOTTLES DRAWN AEROBIC AND ANAEROBIC AER 11ML ANA 11ML   Culture NO GROWTH 5 DAYS    Report Status 05/15/2016 FINAL   MRSA PCR Screening  Result Value Ref Range   MRSA by PCR NEGATIVE NEGATIVE  C difficile quick scan w PCR reflex  Result Value Ref Range   C Diff antigen NEGATIVE NEGATIVE   C Diff toxin NEGATIVE NEGATIVE   C Diff interpretation No C. difficile detected.   Urine culture  Result Value Ref Range   Specimen Description URINE, CATHETERIZED    Special Requests NONE    Culture NO GROWTH Performed at [facility]     Report Status 05/12/2016 FINAL   Blood Culture ID Panel (Reflexed)  Result Value Ref Range   Enterococcus species NOT DETECTED NOT DETECTED   Listeria monocytogenes NOT DETECTED NOT DETECTED   Staphylococcus species DETECTED (A) NOT DETECTED   Staphylococcus aureus NOT DETECTED NOT  DETECTED   Methicillin resistance NOT DETECTED NOT DETECTED   Streptococcus species NOT DETECTED NOT DETECTED   Streptococcus agalactiae NOT DETECTED NOT DETECTED   Streptococcus pneumoniae NOT DETECTED NOT DETECTED   Streptococcus pyogenes NOT DETECTED NOT DETECTED   Acinetobacter baumannii NOT DETECTED NOT DETECTED   Enterobacteriaceae species NOT DETECTED NOT DETECTED   Enterobacter cloacae complex NOT DETECTED NOT DETECTED   Escherichia coli NOT DETECTED NOT DETECTED   Klebsiella oxytoca NOT DETECTED NOT DETECTED   Klebsiella pneumoniae  NOT DETECTED NOT DETECTED   Proteus species NOT DETECTED NOT DETECTED   Serratia marcescens NOT DETECTED NOT DETECTED   Haemophilus influenzae NOT DETECTED NOT DETECTED   Neisseria meningitidis NOT DETECTED NOT DETECTED   Pseudomonas aeruginosa NOT DETECTED NOT DETECTED   Candida albicans NOT DETECTED NOT DETECTED   Candida glabrata NOT DETECTED NOT DETECTED   Candida krusei NOT DETECTED NOT DETECTED   Candida parapsilosis NOT DETECTED NOT DETECTED   Candida tropicalis NOT DETECTED NOT DETECTED  Protime-INR  Result Value Ref Range   Prothrombin Time 13.3 11.4 - 15.2 seconds   INR 1.01   Troponin I  Result Value Ref Range   Troponin I <0.03 <0.03 ng/mL  Lactic acid, plasma  Result Value Ref Range   Lactic Acid, Venous 5.8 (HH) 0.5 - 1.9 mmol/L  Comprehensive metabolic panel  Result Value Ref Range   Sodium 133 (L) 135 - 145 mmol/L   Potassium 2.6 (LL) 3.5 - 5.1 mmol/L   Chloride 96 (L) 101 - 111 mmol/L   CO2 23 22 - 32 mmol/L   Glucose, Bld 233 (H) 65 - 99 mg/dL   BUN 19 6 - 20 mg/dL   Creatinine, Ser 1.61 (H) 0.61 - 1.24 mg/dL   Calcium 9.7 8.9 - 09.6 mg/dL   Total Protein 7.8 6.5 - 8.1 g/dL   Albumin 4.2 3.5 - 5.0 g/dL   AST 35 15 - 41 U/L   ALT 28 17 - 63 U/L   Alkaline Phosphatase 57 38 - 126 U/L   Total Bilirubin 0.7 0.3 - 1.2 mg/dL   GFR calc non Af Amer 33 (L) >60 mL/min   GFR calc Af Amer 38 (L) >60 mL/min   Anion gap 14 5  - 15  Ethanol  Result Value Ref Range   Alcohol, Ethyl (B) <5 <5 mg/dL  Urinalysis, Complete w Microscopic  Result Value Ref Range   Color, Urine YELLOW (A) YELLOW   APPearance CLEAR (A) CLEAR   Specific Gravity, Urine 1.014 1.005 - 1.030   pH 6.0 5.0 - 8.0   Glucose, UA NEGATIVE NEGATIVE mg/dL   Hgb urine dipstick NEGATIVE NEGATIVE   Bilirubin Urine NEGATIVE NEGATIVE   Ketones, ur NEGATIVE NEGATIVE mg/dL   Protein, ur NEGATIVE NEGATIVE mg/dL   Nitrite NEGATIVE NEGATIVE   Leukocytes, UA SMALL (A) NEGATIVE   RBC / HPF 0-5 0 - 5 RBC/hpf   WBC, UA 0-5 0 - 5 WBC/hpf   Bacteria, UA NONE SEEN NONE SEEN   Squamous Epithelial / LPF 0-5 (A) NONE SEEN   Mucous PRESENT   CBC  Result Value Ref Range   WBC 4.4 3.8 - 10.6 K/uL   RBC 1.79 (L) 4.40 - 5.90 MIL/uL   Hemoglobin 5.1 (L) 13.0 - 18.0 g/dL   HCT 04.5 (L) 40.9 - 81.1 %   MCV 87.5 80.0 - 100.0 fL   MCH 28.3 26.0 - 34.0 pg   MCHC 32.3 32.0 - 36.0 g/dL   RDW 91.4 78.2 - 95.6 %   Platelets 129 (L) 150 - 440 K/uL  CK  Result Value Ref Range   Total CK 366 49 - 397 U/L  TSH  Result Value Ref Range   TSH 11.396 (H) 0.350 - 4.500 uIU/mL  CBC with Differential/Platelet  Result Value Ref Range   WBC 10.5 3.8 - 10.6 K/uL   RBC 4.50 4.40 - 5.90 MIL/uL   Hemoglobin 12.7 (L) 13.0 - 18.0 g/dL   HCT 21.3 (L) 08.6 - 57.8 %  MCV 85.6 80.0 - 100.0 fL   MCH 28.1 26.0 - 34.0 pg   MCHC 32.9 32.0 - 36.0 g/dL   RDW 78.2 95.6 - 21.3 %   Platelets 286 150 - 440 K/uL   Neutrophils Relative % 91 %   Neutro Abs 9.5 (H) 1.4 - 6.5 K/uL   Lymphocytes Relative 8 %   Lymphs Abs 0.9 (L) 1.0 - 3.6 K/uL   Monocytes Relative 1 %   Monocytes Absolute 0.1 (L) 0.2 - 1.0 K/uL   Eosinophils Relative 0 %   Eosinophils Absolute 0.0 0 - 0.7 K/uL   Basophils Relative 0 %   Basophils Absolute 0.0 0 - 0.1 K/uL  CBC  Result Value Ref Range   WBC 14.7 (H) 3.8 - 10.6 K/uL   RBC 4.07 (L) 4.40 - 5.90 MIL/uL   Hemoglobin 11.6 (L) 13.0 - 18.0 g/dL   HCT 08.6 (L)  57.8 - 52.0 %   MCV 84.9 80.0 - 100.0 fL   MCH 28.6 26.0 - 34.0 pg   MCHC 33.7 32.0 - 36.0 g/dL   RDW 46.9 62.9 - 52.8 %   Platelets 257 150 - 440 K/uL  Magnesium  Result Value Ref Range   Magnesium 2.0 1.7 - 2.4 mg/dL  Phosphorus  Result Value Ref Range   Phosphorus 3.1 2.5 - 4.6 mg/dL  Glucose, capillary  Result Value Ref Range   Glucose-Capillary 106 (H) 65 - 99 mg/dL  Lactic acid, plasma  Result Value Ref Range   Lactic Acid, Venous 2.4 (HH) 0.5 - 1.9 mmol/L  Procalcitonin - Baseline  Result Value Ref Range   Procalcitonin 58.21 ng/mL  Procalcitonin  Result Value Ref Range   Procalcitonin 143.22 ng/mL  Protime-INR  Result Value Ref Range   Prothrombin Time 13.7 11.4 - 15.2 seconds   INR 1.05   Comprehensive metabolic panel  Result Value Ref Range   Sodium 136 135 - 145 mmol/L   Potassium 4.3 3.5 - 5.1 mmol/L   Chloride 104 101 - 111 mmol/L   CO2 24 22 - 32 mmol/L   Glucose, Bld 136 (H) 65 - 99 mg/dL   BUN 24 (H) 6 - 20 mg/dL   Creatinine, Ser 4.13 (H) 0.61 - 1.24 mg/dL   Calcium 8.3 (L) 8.9 - 10.3 mg/dL   Total Protein 6.5 6.5 - 8.1 g/dL   Albumin 3.7 3.5 - 5.0 g/dL   AST 42 (H) 15 - 41 U/L   ALT 29 17 - 63 U/L   Alkaline Phosphatase 39 38 - 126 U/L   Total Bilirubin 0.8 0.3 - 1.2 mg/dL   GFR calc non Af Amer 29 (L) >60 mL/min   GFR calc Af Amer 34 (L) >60 mL/min   Anion gap 8 5 - 15  Lactic acid, plasma  Result Value Ref Range   Lactic Acid, Venous 1.7 0.5 - 1.9 mmol/L  Potassium  Result Value Ref Range   Potassium 4.4 3.5 - 5.1 mmol/L  Glucose, capillary  Result Value Ref Range   Glucose-Capillary 123 (H) 65 - 99 mg/dL  Glucose, capillary  Result Value Ref Range   Glucose-Capillary 125 (H) 65 - 99 mg/dL  Glucose, capillary  Result Value Ref Range   Glucose-Capillary 103 (H) 65 - 99 mg/dL  Glucose, capillary  Result Value Ref Range   Glucose-Capillary 87 65 - 99 mg/dL  Procalcitonin  Result Value Ref Range   Procalcitonin 158.95 ng/mL  Basic  metabolic panel  Result Value Ref Range  Sodium 138 135 - 145 mmol/L   Potassium 3.5 3.5 - 5.1 mmol/L   Chloride 110 101 - 111 mmol/L   CO2 22 22 - 32 mmol/L   Glucose, Bld 103 (H) 65 - 99 mg/dL   BUN 29 (H) 6 - 20 mg/dL   Creatinine, Ser 0.96 (H) 0.61 - 1.24 mg/dL   Calcium 7.7 (L) 8.9 - 10.3 mg/dL   GFR calc non Af Amer 21 (L) >60 mL/min   GFR calc Af Amer 24 (L) >60 mL/min   Anion gap 6 5 - 15  CBC  Result Value Ref Range   WBC 10.0 3.8 - 10.6 K/uL   RBC 3.83 (L) 4.40 - 5.90 MIL/uL   Hemoglobin 10.9 (L) 13.0 - 18.0 g/dL   HCT 04.5 (L) 40.9 - 81.1 %   MCV 84.3 80.0 - 100.0 fL   MCH 28.6 26.0 - 34.0 pg   MCHC 33.9 32.0 - 36.0 g/dL   RDW 91.4 78.2 - 95.6 %   Platelets 234 150 - 440 K/uL  Glucose, capillary  Result Value Ref Range   Glucose-Capillary 85 65 - 99 mg/dL  Glucose, capillary  Result Value Ref Range   Glucose-Capillary 105 (H) 65 - 99 mg/dL  Glucose, capillary  Result Value Ref Range   Glucose-Capillary 106 (H) 65 - 99 mg/dL  Glucose, capillary  Result Value Ref Range   Glucose-Capillary 82 65 - 99 mg/dL  Glucose, capillary  Result Value Ref Range   Glucose-Capillary 96 65 - 99 mg/dL  Glucose, capillary  Result Value Ref Range   Glucose-Capillary 87 65 - 99 mg/dL  Glucose, capillary  Result Value Ref Range   Glucose-Capillary 75 65 - 99 mg/dL  CBC  Result Value Ref Range   WBC 9.2 3.8 - 10.6 K/uL   RBC 4.22 (L) 4.40 - 5.90 MIL/uL   Hemoglobin 12.0 (L) 13.0 - 18.0 g/dL   HCT 21.3 (L) 08.6 - 57.8 %   MCV 84.3 80.0 - 100.0 fL   MCH 28.3 26.0 - 34.0 pg   MCHC 33.6 32.0 - 36.0 g/dL   RDW 46.9 62.9 - 52.8 %   Platelets 239 150 - 440 K/uL  Basic metabolic panel  Result Value Ref Range   Sodium 142 135 - 145 mmol/L   Potassium 3.7 3.5 - 5.1 mmol/L   Chloride 114 (H) 101 - 111 mmol/L   CO2 20 (L) 22 - 32 mmol/L   Glucose, Bld 84 65 - 99 mg/dL   BUN 26 (H) 6 - 20 mg/dL   Creatinine, Ser 4.13 (H) 0.61 - 1.24 mg/dL   Calcium 8.1 (L) 8.9 - 10.3 mg/dL    GFR calc non Af Amer 21 (L) >60 mL/min   GFR calc Af Amer 24 (L) >60 mL/min   Anion gap 8 5 - 15  Phosphorus  Result Value Ref Range   Phosphorus 3.5 2.5 - 4.6 mg/dL  Magnesium  Result Value Ref Range   Magnesium 2.1 1.7 - 2.4 mg/dL  Glucose, capillary  Result Value Ref Range   Glucose-Capillary 76 65 - 99 mg/dL  Glucose, capillary  Result Value Ref Range   Glucose-Capillary 80 65 - 99 mg/dL  Glucose, capillary  Result Value Ref Range   Glucose-Capillary 79 65 - 99 mg/dL  Glucose, capillary  Result Value Ref Range   Glucose-Capillary 108 (H) 65 - 99 mg/dL  Glucose, capillary  Result Value Ref Range   Glucose-Capillary 81 65 - 99 mg/dL  Glucose, capillary  Result Value Ref Range   Glucose-Capillary 87 65 - 99 mg/dL  Glucose, capillary  Result Value Ref Range   Glucose-Capillary 108 (H) 65 - 99 mg/dL  Glucose, capillary  Result Value Ref Range   Glucose-Capillary 103 (H) 65 - 99 mg/dL  Glucose, capillary  Result Value Ref Range   Glucose-Capillary 102 (H) 65 - 99 mg/dL   Comment 1 Notify RN   Glucose, capillary  Result Value Ref Range   Glucose-Capillary 112 (H) 65 - 99 mg/dL   Comment 1 Notify RN   Glucose, capillary  Result Value Ref Range   Glucose-Capillary 106 (H) 65 - 99 mg/dL   Comment 1 Notify RN   Protein Electro, Random Urine  Result Value Ref Range   Total Protein, Urine 10.7 Not Estab. mg/dL   Albumin ELP, Urine 161.0 %   Alpha-1-Globulin, U 0.0 %   Alpha-2-Globulin, U 0.0 %   Beta Globulin, U 0.0 %   Gamma Globulin, U 0.0 %   M Component, Ur Not Observed Not Observed %   PLEASE NOTE: Comment    PDF .   Protein / creatinine ratio, urine  Result Value Ref Range   Creatinine, Urine 126 mg/dL   Total Protein, Urine 13 mg/dL   Protein Creatinine Ratio 0.10 0.00 - 0.15 mg/mg[Cre]  Protein electrophoresis, serum  Result Value Ref Range   Total Protein ELP 5.3 (L) 6.0 - 8.5 g/dL   Albumin ELP 2.7 (L) 2.9 - 4.4 g/dL   RUEAV-4-UJWJXBJY 0.3 0.0 - 0.4  g/dL   NWGNF-6-OZHYQMVH 0.8 0.4 - 1.0 g/dL   Beta Globulin 0.7 0.7 - 1.3 g/dL   Gamma Globulin 0.8 0.4 - 1.8 g/dL   M-Spike, % Not Observed Not Observed g/dL   SPE Interp. Comment    Comment Comment    GLOBULIN, TOTAL 2.6 2.2 - 3.9 g/dL   A/G Ratio 1.0 0.7 - 1.7  Basic metabolic panel  Result Value Ref Range   Sodium 142 135 - 145 mmol/L   Potassium 3.5 3.5 - 5.1 mmol/L   Chloride 114 (H) 101 - 111 mmol/L   CO2 22 22 - 32 mmol/L   Glucose, Bld 125 (H) 65 - 99 mg/dL   BUN 23 (H) 6 - 20 mg/dL   Creatinine, Ser 8.46 (H) 0.61 - 1.24 mg/dL   Calcium 7.9 (L) 8.9 - 10.3 mg/dL   GFR calc non Af Amer 27 (L) >60 mL/min   GFR calc Af Amer 31 (L) >60 mL/min   Anion gap 6 5 - 15  Glucose, capillary  Result Value Ref Range   Glucose-Capillary 136 (H) 65 - 99 mg/dL  Glucose, capillary  Result Value Ref Range   Glucose-Capillary 124 (H) 65 - 99 mg/dL  Glucose, capillary  Result Value Ref Range   Glucose-Capillary 120 (H) 65 - 99 mg/dL  Glucose, capillary  Result Value Ref Range   Glucose-Capillary 122 (H) 65 - 99 mg/dL   Comment 1 Notify RN   Glucose, capillary  Result Value Ref Range   Glucose-Capillary 89 65 - 99 mg/dL   Comment 1 Notify RN   Type and screen [facility]  Result Value Ref Range   ABO/RH(D) A POS    Antibody Screen NEG    Sample Expiration 05/13/2016    Unit Number [id number]    Blood Component Type RCLI PHER 1    Unit division 00    Status of Unit REL FROM [facility]    Transfusion Status OK  TO TRANSFUSE    Crossmatch Result Compatible    Unit Number [id number]    Blood Component Type RCLI PHER 2    Unit division 00    Status of Unit REL FROM [facility]    Transfusion Status OK TO TRANSFUSE    Crossmatch Result Compatible    Unit Number [id number]    Blood Component Type RED CELLS,LR    Unit division 00    Status of Unit REL FROM [facility]    Transfusion Status OK TO TRANSFUSE    Crossmatch Result Compatible    Unit Number [id number]     Blood Component Type RED CELLS,LR    Unit division 00    Status of Unit REL FROM [facility]    Transfusion Status OK TO TRANSFUSE    Crossmatch Result Compatible   Prepare RBC  Result Value Ref Range   Order Confirmation ORDER PROCESSED BY BLOOD BANK   ABO/Rh  Result Value Ref Range   ABO/RH(D) A POS       Assessment & Plan:   Problem List Items Addressed This Visit      Cardiovascular and Mediastinum   Hypertension goal BP (blood pressure) < 140/90    Controlled without any medicine; try DASH guidelines      Atherosclerosis of both carotid arteries    Re-order carotid US; avoid saturated fats; continue statin and aspirin      Abdominal aortic atherosclerosis (HCC)    Reviewed CT scan with him; showed model of atherosclerosis; discussed goal LDL under 70; continue statin        Digestive   SBO (small bowel obstruction)    Resolved, no blood or mucous        Nervous and Auditory   Spastic hemiplegia affecting dominant side (HCC)    Unchanged; pt to follow-up with neurologist in January        Genitourinary   Acute kidney injury (HCC)    Staying hydrated; check BMP today; refer to Dr. Cherylann Ratel; continue to hold BP meds, monitor at home, will defer to nephrologist to restart      Relevant Orders   Basic Metabolic Panel (BMET)   Ambulatory referral to Nephrology     Other   Hyperlipidemia LDL goal <70    Goal LDL < 70; continue statin      Bilateral inguinal hernia without obstruction or gangrene    Refer to surgeon, go to ER if incarceration symptoms      Relevant Orders   Ambulatory referral to General Surgery      Follow up plan: Return in about 6 months (around 11/18/2016) for regular follow-up, but certainly sooner if needed.  An after-visit summary was printed and given to the patient at check-out.  Please see the patient instructions which may contain other information and recommendations beyond what is mentioned above in the assessment and  plan.  No orders of the defined types were placed in this encounter.   Orders Placed This Encounter  Procedures  . Basic Metabolic Panel (BMET)  . Ambulatory referral to General Surgery  . Ambulatory referral to Nephrology

## 2016-05-20 NOTE — Assessment & Plan Note (Signed)
Refer to surgeon, go to ER if incarceration symptoms

## 2016-05-21 LAB — BASIC METABOLIC PANEL
BUN: 12 mg/dL (ref 7–25)
CALCIUM: 8.4 mg/dL — AB (ref 8.6–10.3)
CHLORIDE: 104 mmol/L (ref 98–110)
CO2: 26 mmol/L (ref 20–31)
CREATININE: 1.91 mg/dL — AB (ref 0.70–1.25)
GLUCOSE: 93 mg/dL (ref 65–99)
Potassium: 3.6 mmol/L (ref 3.5–5.3)
Sodium: 142 mmol/L (ref 135–146)

## 2016-05-25 ENCOUNTER — Telehealth: Payer: Self-pay | Admitting: Physical Medicine & Rehabilitation

## 2016-05-25 ENCOUNTER — Telehealth: Payer: Self-pay

## 2016-05-25 MED ORDER — AMLODIPINE BESYLATE 10 MG PO TABS: 10.0000 mg | ORAL_TABLET | Freq: Every day | ORAL | 0 refills | Status: DC

## 2016-05-25 NOTE — Telephone Encounter (Signed)
Pt notified and confirmed

## 2016-05-25 NOTE — Telephone Encounter (Signed)
[facility] chlorthalidone and lisinopril due to kidney function.  Patients bp is now running high yesterday was 170's/90's today 180's/90's please advise?  He has not got an appt yet for kidney specialist waiting for them to call him with an appt everything has been sent in.

## 2016-05-25 NOTE — Telephone Encounter (Signed)
Per [facility] will provide 2 intermittent appt visits every 6 week for [name] only related to CVA appts for a couple of months- if he needs appts for recent sepsis/renal issues to other doctors - need to contact PCP, as FMLA already written for wife.

## 2016-05-25 NOTE — Telephone Encounter (Signed)
Have him start amlodipine today, 10 mg Rx sent to local [facility] Monitor BP and call back if top number not under 150 consistently Follow DASH guidelines (less salt, less processed foods, etc)

## 2016-06-01 ENCOUNTER — Encounter: Payer: Self-pay | Admitting: General Surgery

## 2016-06-01 ENCOUNTER — Ambulatory Visit (INDEPENDENT_AMBULATORY_CARE_PROVIDER_SITE_OTHER): Payer: Medicare HMO | Admitting: General Surgery

## 2016-06-01 VITALS — BP 168/88 | HR 90 | Resp 16 | Ht 71.5 in | Wt 228.0 lb

## 2016-06-01 DIAGNOSIS — K4001 Bilateral inguinal hernia, with obstruction, without gangrene, recurrent: Secondary | ICD-10-CM

## 2016-06-01 IMAGING — CT CT ANGIOGRAPHY HEAD
3 of 8 series · 17 of 47 positions shown · IV contrast (APPLIED)
Comparison: Brain MRI 12/25/2013.  Head CT 12/24/2013.

CLINICAL DATA: Two days of right upper extremity weakness.
Dysarthria. Possible left M1 stenosis on brain MRI.

EXAM:
CT ANGIOGRAPHY HEAD
TECHNIQUE: Multidetector CT imaging of the head was performed using the
standard protocol during bolus administration of intravenous
contrast. Multiplanar CT image reconstructions and MIPs were
obtained to evaluate the vascular anatomy.
CONTRAST:  80 mL Isovue 370

[Series 4: cta head · axial · 0.47mm/px · z∈[-143,-10]mm · 11 of 161 slices shown]
[im 14/161  brain]
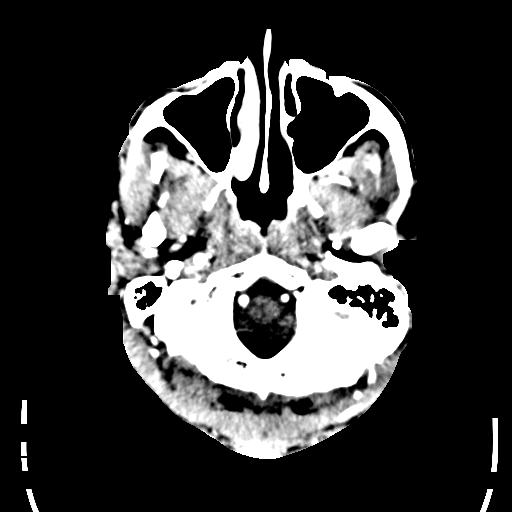
[im 27/161  bone]
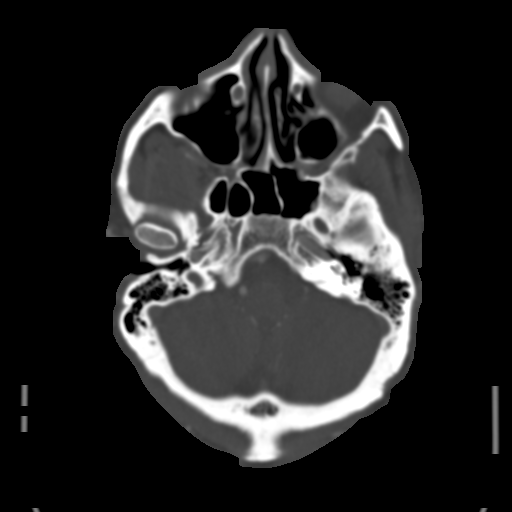
[im 41/161  brain]
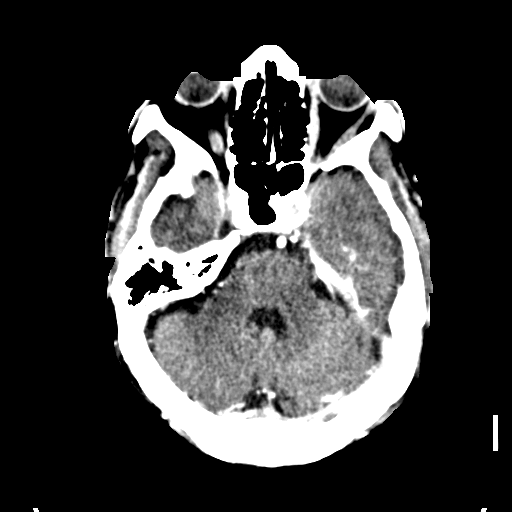
[im 54/161  bone]
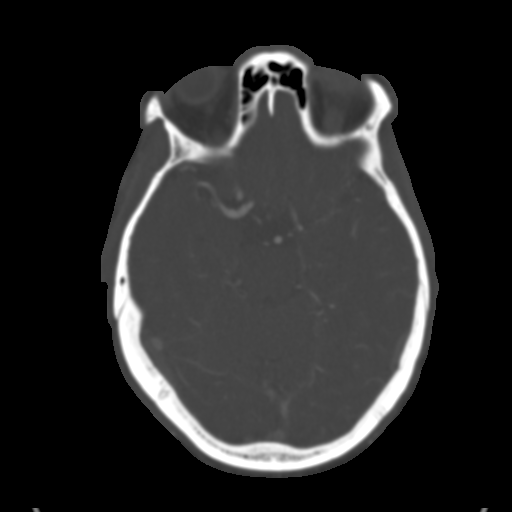
[im 67/161  brain]
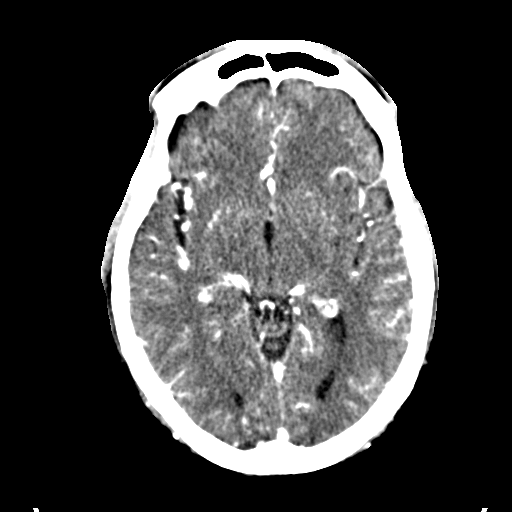
[im 81/161  bone]
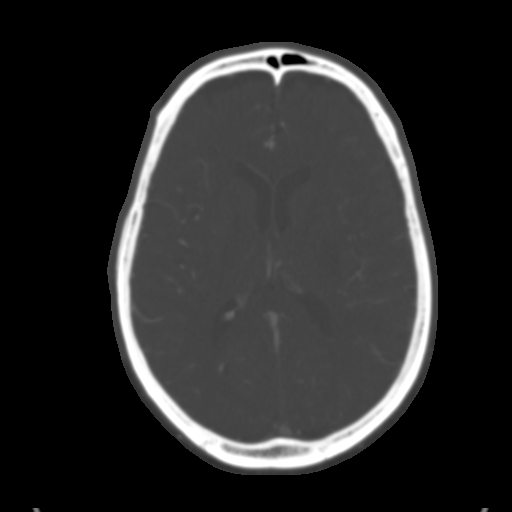
[im 94/161  brain]
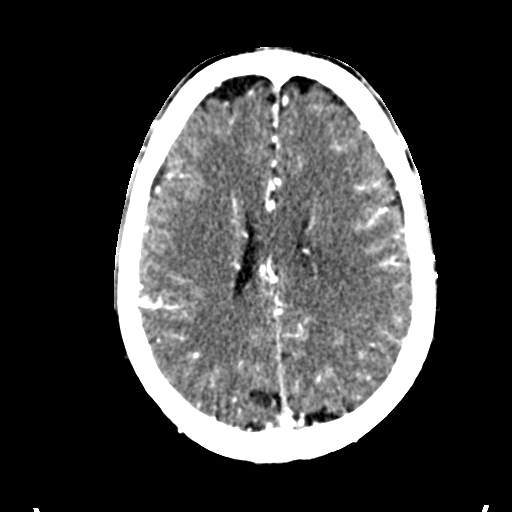
[im 107/161  bone]
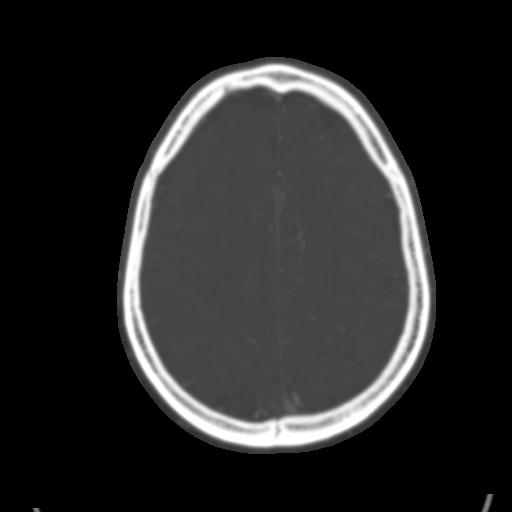
[im 121/161  brain]
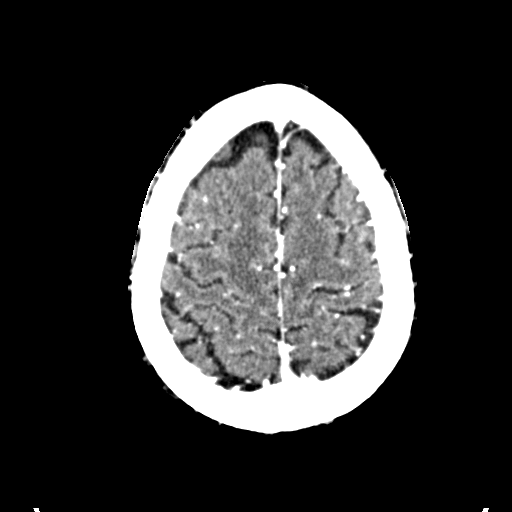
[im 134/161  bone]
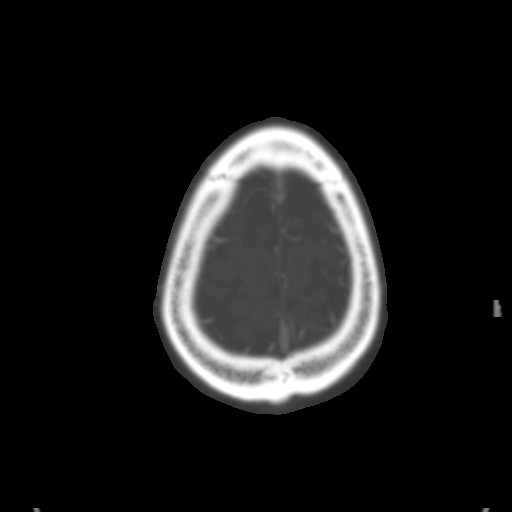
[im 147/161  brain]
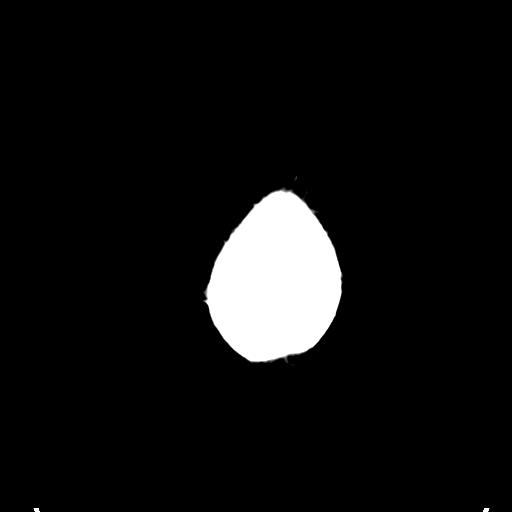

[Series 7: cor thin · coronal · 0.34mm/px · 3 of 213 slices shown]
[im 61/213  brain]
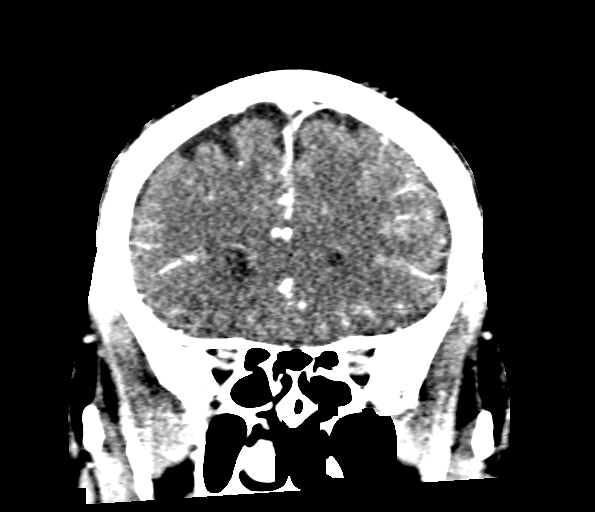
[im 91/213  brain]
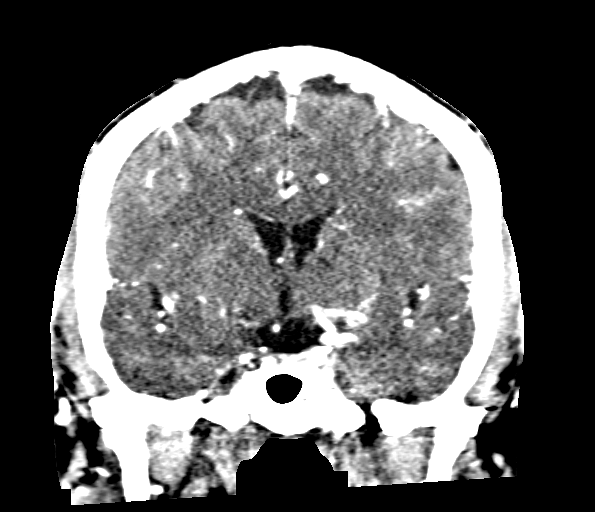
[im 122/213  brain]
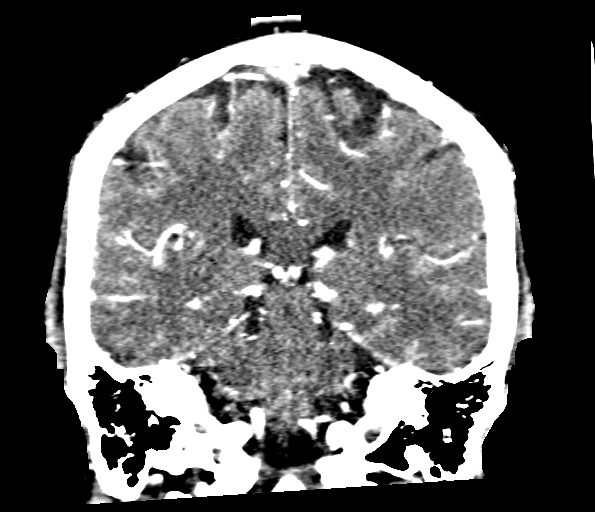

[Series 9: sag thin · sagittal · 0.39mm/px · 3 of 167 slices shown]
[im 34/167  brain]
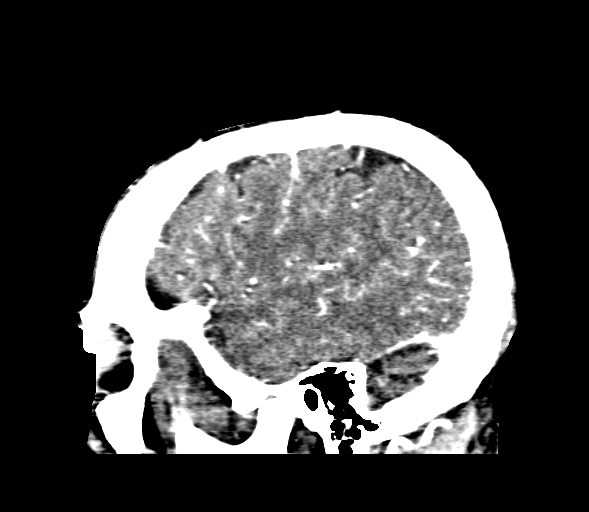
[im 67/167  brain]
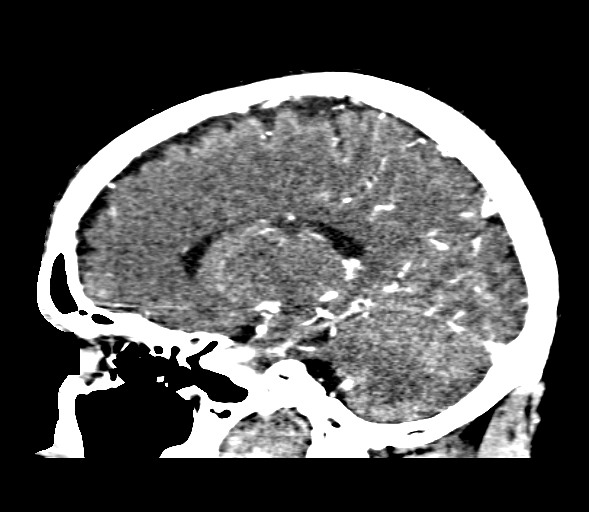
[im 100/167  brain]
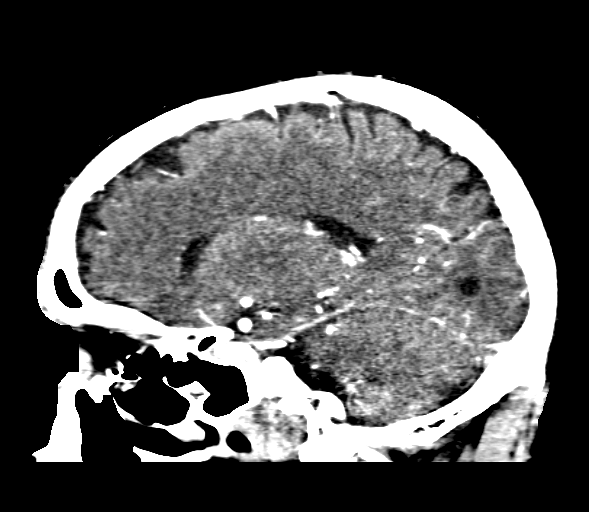

[17 of 47 positions shown; findings below may reference images not displayed]

FINDINGS: Postcontrast CT images of the head again demonstrate an acute
infarct involving the posterior left lentiform nucleus and corona
radiata. Acute infarct in the left frontal operculum is not well
demonstrated. There is no evidence of acute intracranial hemorrhage.
There is no midline shift or extra-axial fluid collection. No
abnormal enhancement is seen. Orbits are unremarkable. Mastoid air
cells and visualized paranasal sinuses are clear.

Visualized distal vertebral arteries are patent with the right being
slightly larger than the left. PICA origins are patent. AICA and
PICA origins are patent. Basilar artery is patent without stenosis.
There is a small right posterior communicating artery. There are
mild to moderate stenoses of the mid right P2 and mid to distal left
P2 segments.

Internal carotid arteries are patent from skullbase to carotid
termini. There is approximately 50% stenosis of the horizontal
cavernous segment of the left ICA. There is also mild narrowing of
the anterior cavernous and proximal supraclinoid segments of the
right ICA. There is mild, less than 50% narrowing of the proximal
left M1 segment. There is occlusion of the proximal M2 superior
division trunk on the left. Left MCA sylvian branches are smaller in
caliber than those on the right, however superior division branches
appear partially reconstituted by collateral flow.

The right MCA is unremarkable. The right A1 segment is dominant,
with the left A1 segment being mildly irregular without focal
stenosis. The PCAs are otherwise unremarkable. No intracranial
aneurysm is identified.

Review of the MIP images confirms the above findings.
IMPRESSION: 1. Proximal left M2 superior division occlusion. Reconstitution of
distal branches by collateral flow.
2. Mild left greater than right intracranial ICA stenosis.
3. Mild to moderate bilateral P2 stenoses.
These results were called by telephone at the time of interpretation
on 12/25/2013 at [DATE] to Dr. JOANN LAM , who verbally
acknowledged these results.

## 2016-06-01 NOTE — Patient Instructions (Addendum)
The patient is aware to call back for any questions or concerns.  The patient is scheduled for surgery at [facility] on 06/25/16. He will pre admit by phone. The patient is aware of date and instructions.

## 2016-06-01 NOTE — Progress Notes (Signed)
Patient ID: [name], male   DOB: 1955/08/26, 61 y.o.   MRN: [id number]  Chief Complaint  Patient presents with  . Hernia    HPI [name] is a 61 y.o. male.  Patient here today for an evaluation of a hernia.  He states that he have noticed swelling bilaterally for about 2-3 months with the right side worse.  It seems to be causing some lower abdominal tenderness.  No nausea, vomiting, constipation or diarrhea noted. About a month ago he was admitted to the hospital with significant abdominal complaints. Suspected to be a viral gastroenteritis. He has since been feeling better. He is here today with his wife. I have reviewed the history of present illness with the patient.  HPI  Past Medical History:  Diagnosis Date  . Hyperlipidemia   . Hypertension   . Stroke [name]) 12/24/2013   right side weakness    Past Surgical History:  Procedure Laterality Date  . KNEE ARTHROSCOPY Right 1970's    Family History  Problem Relation Age of Onset  . Hypertension Mother     Social History Social History  Substance Use Topics  . Smoking status: Never Smoker  . Smokeless tobacco: Never Used  . Alcohol use No    No Known Allergies  Current Outpatient Prescriptions  Medication Sig Dispense Refill  . amLODipine (NORVASC) 10 MG tablet Take 1 tablet (10 mg total) by mouth daily. 30 tablet 0  . aspirin EC 325 MG tablet Take 1 tablet (325 mg total) by mouth daily. 90 tablet 3  . atorvastatin (LIPITOR) 80 MG tablet Take 1 tablet (80 mg total) by mouth daily. (new cholesterol medicine, stop the pravastatin) (Patient taking differently: Take 80 mg by mouth at bedtime. (new cholesterol medicine, stop the pravastatin)) 30 tablet 1  . Multiple Vitamins-Minerals (ONE-A-DAY MENS HEALTH FORMULA) TABS Take 1 tablet by mouth daily.    . Omega-3 Fatty Acids (FISH OIL) 1000 MG CAPS Take 1,000 mg by mouth daily.     No current facility-administered medications for this visit.     Review of  Systems Review of Systems  Blood pressure (!) 168/88, pulse 90, resp. rate 16, height 5' 11.5" (1.816 m), weight 228 lb (103.4 kg).  Physical Exam Physical Exam  Constitutional: He is oriented to person, place, and time. He appears well-developed and well-nourished.  Eyes: Conjunctivae are normal. No scleral icterus.  Neck: Neck supple.  Cardiovascular: Normal rate, regular rhythm and normal heart sounds.   Pulmonary/Chest: Effort normal and breath sounds normal.  Abdominal: Soft. Normal appearance and bowel sounds are normal. There is tenderness. A hernia is present. Hernia confirmed positive in the right inguinal area and confirmed positive in the left inguinal area.  Pt has complete scrotal hernias on both sides, right side much larger than left  Lymphadenopathy:    He has no cervical adenopathy.  Neurological: He is alert and oriented to person, place, and time.  Skin: Skin is warm and dry.  Psychiatric: His behavior is normal.    Data Reviewed Progress notes CT scan done during last hospital admission was reviewed. Bilateral inguinal hernias are noted. Right side has loops of small bowel with suspicion of area of partial obstruction. Left side has a good portion of the sigmoid colon in the hernia.   Assessment    Bilateral large inguinal hernias, loops of small bowel in right and part of colon on left. By CT, there appears to be some partial obstruction in the small intestine.  Plan    Recommended repair of both hernias. This is best accomplished by open technique, since it will give a better assessment of the herniated bowel on both sides. Procedure was explained to him, including risks. He is agreeable.   Hernia precautions and incarceration were discussed with the patient. If they develop symptoms of an incarcerated hernia, they were encouraged to seek prompt medical attention.  I have recommended repair of the hernia using mesh on an outpatient basis in the near  future. The risk of infection was reviewed. The role of prosthetic mesh to minimize the risk of recurrence was reviewed.  The patient is scheduled for surgery at [facility] on 06/25/16. He will pre admit by phone. The patient is aware of date and instructions.   This information has been scribed by Dorathy DaftMarsha Hatch RN, BSN,BC.   [name] 06/02/2016, 10:47 AM

## 2016-06-08 ENCOUNTER — Encounter: Payer: Self-pay | Admitting: Physical Medicine & Rehabilitation

## 2016-06-08 ENCOUNTER — Encounter: Payer: BLUE CROSS/BLUE SHIELD | Attending: Physical Medicine & Rehabilitation

## 2016-06-08 ENCOUNTER — Ambulatory Visit (HOSPITAL_BASED_OUTPATIENT_CLINIC_OR_DEPARTMENT_OTHER): Payer: BLUE CROSS/BLUE SHIELD | Admitting: Physical Medicine & Rehabilitation

## 2016-06-08 VITALS — BP 162/80 | HR 93

## 2016-06-08 DIAGNOSIS — Z7982 Long term (current) use of aspirin: Secondary | ICD-10-CM | POA: Diagnosis not present

## 2016-06-08 DIAGNOSIS — M25511 Pain in right shoulder: Secondary | ICD-10-CM | POA: Insufficient documentation

## 2016-06-08 DIAGNOSIS — G8110 Spastic hemiplegia affecting unspecified side: Secondary | ICD-10-CM

## 2016-06-08 DIAGNOSIS — R609 Edema, unspecified: Secondary | ICD-10-CM | POA: Diagnosis not present

## 2016-06-08 DIAGNOSIS — I69351 Hemiplegia and hemiparesis following cerebral infarction affecting right dominant side: Secondary | ICD-10-CM | POA: Insufficient documentation

## 2016-06-08 NOTE — Progress Notes (Signed)
Subjective:    Patient ID: [name], male    DOB: 11-30-1955, 61 y.o.   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 He did not need surgery. Did not require any dialysis. No falls since hospitalization. Patient initially required a walker after he came home again. Now back to walking without an assistive device. He is starting to work out on his bicycle once again. He's noticed some increased swelling in the right arm. The patient is planning to have elective hernia surgery, beginning of February. Pain Inventory Average Pain 1 Pain Right Now 0 My pain is intermittent  In the last 24 hours, has pain interfered with the following? General activity 0 Relation with others 0 Enjoyment of life 0 What TIME of day is your pain at its worst? . Sleep (in general) .  Pain is worse with: . Pain improves with: therapy/exercise and TENS Relief from Meds: 10  Mobility walk without assistance ability to climb steps?  yes do you drive?  yes  Function disabled: date disabled . I need assistance with the following:  meal prep  Neuro/Psych No problems in this area  Prior Studies Any changes since last visit?  yes  Physicians involved in your care Any changes since last visit?  no   Family History  Problem Relation Age of Onset  . Hypertension Mother    Social History   Social History  . Marital status: Married    Spouse name: N/A  . Number of children: N/A  . Years of education: N/A   Social History Main Topics  . Smoking status: Never Smoker  . Smokeless tobacco: Never Used  . Alcohol use No  . Drug use: No  . Sexual activity: No   Other Topics Concern  . Not on file   Social History Narrative  . No narrative on file   Past Surgical History:  Procedure Laterality Date  . KNEE ARTHROSCOPY  Right 1970's   Past Medical History:  Diagnosis Date  . Hyperlipidemia   . Hypertension   . Stroke ([facility]) 12/24/2013   right side weakness   There were no vitals taken for this visit.  Opioid Risk Score:   Fall Risk Score:  `1  Depression screen PHQ 2/9  Depression screen [facility] 2/9 05/20/2016 04/19/2016 01/12/2016 11/18/2015 10/06/2015 08/26/2015 07/21/2015  Decreased Interest 0 0 0 0 0 0 0  Down, Depressed, Hopeless 0 0 0 0 - 0 0  PHQ - 2 Score 0 0 0 0 0 0 0  Altered sleeping - - - - - - -  Tired, decreased energy - - - - - - -  Change in appetite - - - - - - -  Feeling bad or failure about yourself  - - - - - - -  Trouble concentrating - - - - - - -  Moving slowly or fidgety/restless - - - - - - -  Suicidal thoughts - - - - - - -  PHQ-9 Score - - - - - - -   Review of Systems  Constitutional: Negative.   HENT: Negative.   Eyes: Negative.   Respiratory: Negative.   Cardiovascular: Negative.   Gastrointestinal: Negative.   Endocrine: Negative.   Genitourinary: Negative.   Musculoskeletal: Negative.   Skin: Negative.   Allergic/Immunologic: Negative.   Neurological: Negative.  Hematological: Negative.   Psychiatric/Behavioral: Negative.   All other systems reviewed and are negative.      Objective:   Physical Exam  Constitutional: He is oriented to person, place, and time. He appears well-developed and well-nourished.  HENT:  Head: Normocephalic and atraumatic.  Neurological: He is alert and oriented to person, place, and time.  Psychiatric: He has a normal mood and affect. His behavior is normal. Judgment and thought content normal.  Nursing note and vitals reviewed. Ashworth 3 at the right pectoralis, right elbow flexor and right finger and wrist flexor. Motor strength is 3 minus at the deltoid, 3 minus at the biceps 2 minus at the triceps, 0 at the finger extensors 2 minus at the finger flexors. Right lower extremity 4/5 hip flexor, knee extensor, wears  AFO. Ambulates without evidence of toe drag with the AFO on. No knee instability. Mood and affect are appropriate        Assessment & Plan:  1. Chronic right spastic hemiplegia secondary to CVA. He has had hospitalization for sepsis which has resulted in a decline in his strength as well as swelling of the right upper extremity. His wife came in with him today. We discussed that the swelling in the right arm is likely a combination of IV fluids, weakness in the upper extremity with dependent edema. We discussed that at this point, I would not recommend Botox until the swelling has receded. We discussed retrograde massage and elevation. Will have patient come back in approximately 6 weeks for Dysport injection  Plan Dysport Biceps 300 Brachialis, 300 FCR 100 FDS 100 FDP 100 Pronator, 100 Brachial radialis, 100 Pectoralis 400

## 2016-06-08 NOTE — Patient Instructions (Signed)
Please elevate your right hand above the heart level and use lotion to massage the fluid out from your hand and forearm up to  elbow 100 strokes daily

## 2016-06-11 DIAGNOSIS — N184 Chronic kidney disease, stage 4 (severe): Secondary | ICD-10-CM | POA: Diagnosis not present

## 2016-06-11 DIAGNOSIS — D649 Anemia, unspecified: Secondary | ICD-10-CM | POA: Diagnosis not present

## 2016-06-11 DIAGNOSIS — N179 Acute kidney failure, unspecified: Secondary | ICD-10-CM | POA: Diagnosis not present

## 2016-06-12 ENCOUNTER — Other Ambulatory Visit: Payer: Self-pay | Admitting: Family Medicine

## 2016-06-15 ENCOUNTER — Ambulatory Visit: Payer: BLUE CROSS/BLUE SHIELD | Admitting: Urology

## 2016-06-16 ENCOUNTER — Encounter
Admission: RE | Admit: 2016-06-16 | Discharge: 2016-06-16 | Disposition: A | Discharge status: Home / Self Care | Payer: Medicare HMO | Source: Ambulatory Visit | Attending: General Surgery | Admitting: General Surgery

## 2016-06-16 HISTORY — DX: Chronic kidney disease, unspecified: N18.9

## 2016-06-16 HISTORY — DX: Unspecified intestinal obstruction, unspecified as to partial versus complete obstruction: K56.609

## 2016-06-16 HISTORY — DX: Sepsis, unspecified organism: A41.9

## 2016-06-16 NOTE — Patient Instructions (Signed)
  Your procedure is scheduled on: 06-25-16 Report to Same Day Surgery 2nd floor medical mall [facility](Medical Mall Entrance-take elevator on left to 2nd floor.  Check in with surgery information desk.) To find out your arrival time please call [phone] between 1PM - 3PM on 06-24-16  Remember: Instructions that are not followed completely may result in serious medical risk, up to and including death, or upon the discretion of your surgeon and anesthesiologist your surgery may need to be rescheduled.    _x___ 1. Do not eat food or drink liquids after midnight. No gum chewing or hard candies.     __x__ 2. No Alcohol for 24 hours before or after surgery.   __x__3. No Smoking for 24 prior to surgery.   ____  4. Bring all medications with you on the day of surgery if instructed.    __x__ 5. Notify your doctor if there is any change in your medical condition     (cold, fever, infections).     Do not wear jewelry, make-up, hairpins, clips or nail polish.  Do not wear lotions, powders, or perfumes. You may wear deodorant.  Do not shave 48 hours prior to surgery. Men may shave face and neck.  Do not bring valuables to the hospital.    [facility] is not responsible for any belongings or valuables.               Contacts, dentures or bridgework may not be worn into surgery.  Leave your suitcase in the car. After surgery it may be brought to your room.  For patients admitted to the hospital, discharge time is determined by your treatment team.   Patients discharged the day of surgery will not be allowed to drive home.  You will need someone to drive you home and stay with you the night of your procedure.    Please read over the following fact sheets that you were given:   [facility] Preparing for Surgery and or MRSA Information   _x___ Take these medicines the morning of surgery with A SIP OF WATER:    1. AMLODIPINE  2.  3.  4.  5.  6.  ____Fleets enema or Magnesium Citrate as  directed.   ____ Use CHG Soap or sage wipes as directed on instruction sheet   ____ Use inhalers on the day of surgery and bring to hospital day of surgery  ____ Stop metformin 2 days prior to surgery    ____ Take 1/2 of usual insulin dose the night before surgery and none on the morning of  surgery.   ____ Stop Aspirin, Coumadin, Pllavix ,Eliquis, Effient, or Pradaxa  x__ Stop Anti-inflammatories such as Advil, Aleve, Ibuprofen, Motrin, Naproxen,          Naprosyn, Goodies powders or aspirin products 7 DAYS PRIOR TO SURGERY-Ok to take Tylenol.   _X___ Stop supplements until after surgery-STOP FISH OIL 7 DAYS PRIOR  ____ Bring C-Pap to the hospital.

## 2016-06-18 ENCOUNTER — Telehealth: Payer: Self-pay | Admitting: Physical Medicine & Rehabilitation

## 2016-06-18 NOTE — Telephone Encounter (Signed)
Recd req from [employer] with incomplete med cert for [name] Wife to leave work to transport ptn.  Sent last encounter notes with their fax req we completed [phone]

## 2016-06-21 ENCOUNTER — Telehealth: Payer: Self-pay | Admitting: Family Medicine

## 2016-06-21 DIAGNOSIS — H10029 Other mucopurulent conjunctivitis, unspecified eye: Secondary | ICD-10-CM

## 2016-06-21 DIAGNOSIS — L03213 Periorbital cellulitis: Secondary | ICD-10-CM | POA: Diagnosis not present

## 2016-06-21 DIAGNOSIS — H1033 Unspecified acute conjunctivitis, bilateral: Secondary | ICD-10-CM | POA: Diagnosis not present

## 2016-06-21 HISTORY — DX: Other mucopurulent conjunctivitis, unspecified eye: H10.029

## 2016-06-21 NOTE — Telephone Encounter (Signed)
IS TO HAVE HERNIA SURGERY THIS WEEK ( Friday) BUT OVER THE WEEKEND HIS EYES STARTED RUNNING AND ITCHING AND RED. WHAT DO YOU THINK HE COULD GET. DOES NOT KOW IF IT IS INFECTED OR WHAT.

## 2016-06-21 NOTE — Telephone Encounter (Signed)
It could be allergic conjunctivitis, viral conjunctivitis, an allergic reaction, bacterial conjunctivitis, and other issues I would recommend he see an eye doctor

## 2016-06-22 ENCOUNTER — Ambulatory Visit: Payer: BLUE CROSS/BLUE SHIELD | Admitting: Urology

## 2016-06-22 NOTE — Telephone Encounter (Signed)
[facility] and he has bacterial conjunctivitis. The clinic prescribed him antibiotics. Pt is putting heat compress and ice pack and eye drops on his eyes as well. He Mention to me its getting little better.

## 2016-06-23 ENCOUNTER — Telehealth: Payer: Self-pay | Admitting: *Deleted

## 2016-06-23 NOTE — Telephone Encounter (Signed)
Patient called the office today to report that he was seen at [facility] on Monday, 06-21-16. He was diagnosed with pink eye in both eyes.   The patient states he is on clindamycin 300 mg one PO TID for 10 days as well as Ocuflox eye drops three times a day. Medication list updated accordingly.  Dr. Evette CristalSankar notifed as well as Herbert SetaHeather in the Pre-admission Department.   Dr. Evette CristalSankar is okay to proceed as scheduled with bilateral inguinal hernia repair on Friday, 06-25-16 at [facility].  Heather from Pre-admit will call back if there is a problem proceeding on anesthesia's end; otherwise, we will proceed as scheduled.

## 2016-06-24 ENCOUNTER — Encounter: Payer: Self-pay | Admitting: *Deleted

## 2016-06-24 NOTE — Pre-Procedure Instructions (Signed)
SPOKE WITH SARAH WALL FROM INFECTIOUS DISEASE REGARDING PT WITH PINKEYE THAT IS (CURRENTLY BEING TREATED WITH PO ANTIBIOTICS AND EYEDROPS) AND HAVING [facility] STATES THAT PER PROTOCOL STANDARD PRECAUTIONS NEED TO USED AND HANDWASHING IS VERY IMPORTANT, ESPECIALLY FOR ANESTHESIA SINCE THEY WILL BE IN CONTACT WITH FACIAL AREA. NO FURTHER PRECAUTIONS NEEDED

## 2016-06-24 NOTE — Pre-Procedure Instructions (Signed)
EKG  I personally interpreted any EKGs ordered by me or triage Sinus rhythm rate 72 beats per an acute ST elevation or acute ST depression normal axis unremarkable EKG ____________________________________________  RADIOLOGY  I reviewed any imaging ordered by me or triage that were performed during my shift and, if possible, patient and/or family made aware of any abnormal findings. ____________________________________________   PROCEDURES  Procedure(s) performed: None  Procedures  Critical Care performed: CRITICAL CARE Performed by: Jeanmarie Plant   Total critical care time: 55 minutes  Critical care time was exclusive of separately billable procedures and treating other patients.  Critical care was necessary to treat or prevent imminent or life-threatening deterioration.  Critical care was time spent personally by me on the following activities: development of treatment plan with patient and/or surrogate as well as nursing, discussions with consultants, evaluation of patient's response to treatment, examination of patient, obtaining history from patient or surrogate, ordering and performing treatments and interventions, ordering and review of laboratory studies, ordering and review of radiographic studies, pulse oximetry and re-evaluation of patient's condition.   ____________________________________________   INITIAL IMPRESSION / ASSESSMENT AND PLAN / ED COURSE  Pertinent labs & imaging results that were available during my care of the patient were reviewed by me and considered in my medical decision making (see chart for details).  Patient with persistent hypotension getting IV fluids. Potassium is low we are repeating his potassium IV as he cannot take by mouth at this time. Patient with a reassuring EKG. He has no chest pain or shortness of breath. His sats are good, his lungs are clear. Chest x-ray pending. CT scan was emergently performed to rule out  AAA in this hypotensive patient with abdominal pain. There is some evidence according to radiology of a SBO. Surgery agrees with our management thus far and they will come evaluate the patient. Patient remains lucid and awake. It is my hope that with IV fluids and bring up his pressure. At this time, we'll hold off on pressors preferring to begin with aggressive IV hydration, if we do need to change it we will do so.There is no evidence of aortic dissection by history. Patient has lower abdominal pain and vomiting. Obviously a noncontrasted CT scan is not a great study to evaluate up with her certainly no evidence of an intra-abdominal AAA and patient has had no chest pain and no shortness of breath and no back pain. No evidence of overdose of medications, he is afebrile white count is reassuring. There is no evidence of abdominal perforation according to radiology.    ----------------------------------------- 8:10 PM on 05/10/2016 -----------------------------------------  [place] with radiology, they feel the patient has an SBO, discussed with Dr. Everlene Farrier, he agrees with IV fluid, and he agrees with Zosyn. He will come evaluate the patient.  ----------------------------------------- 9:00 PM on 05/10/2016 -----------------------------------------  Patient hemoglobin as noted is 5 but he is not evidently bleeding anywhere this is somewhat surprising. I went to recheck that prior to starting blood. He could be dilution all the nurses to draw back and waist prior to sending it. I talked to the ICU attending Dr. Zannie Cove, who agrees with management and will accept onto his service. He would like an NG tube which we will place.  ----------------------------------------- 9:51 PM on 05/10/2016 -----------------------------------------  Clinically, he did not look anemic and I his hemoglobin and this is reassuring. I see made aware. We are not going to give him a transfusion. I've canceled  blood.  Clinical Course    ____________________________________________   FINAL CLINICAL IMPRESSION(S) / ED DIAGNOSES  Final diagnoses:  Hypotension      This chart was dictated using voice recognition software.  Despite best efforts to proofread,  errors can occur which can change meaning.      Jeanmarie PlantJames A McShane, MD 05/10/16 [id number]    Jeanmarie PlantJames A McShane, MD 05/10/16 [id number]    Jeanmarie PlantJames A McShane, MD 05/10/16 [id number]    Jeanmarie PlantJames A McShane, MD 05/10/16 [id number]     Electronically signed by Jeanmarie PlantJames A McShane, MD at 05/10/2016 8:21 PM Electronically signed by Jeanmarie PlantJames A McShane, MD at 05/10/2016 8:23 PM Electronically signed by Jeanmarie PlantJames A McShane, MD at 05/10/2016 9:01 PM Electronically signed by Jeanmarie PlantJames A McShane, MD at 05/10/2016 9:51 PM      ED to Hosp-Admission (Discharged) on 05/10/2016        Revision History        Detailed Report

## 2016-06-24 NOTE — Pre-Procedure Instructions (Signed)
Progress Notes Encounter Date: 05/20/2016 1:20 PM Kerman Passey, MD  Family Medicine    [] Hide copied text   BP 134/82 (BP Location: Left Arm, Patient Position: Sitting, Cuff Size: Normal)   Pulse 89   Temp 98.3 F (36.8 C) (Oral)   Resp 14   Wt 229 lb 2 oz (103.9 kg)   SpO2 98%   BMI 28.64 kg/m    Subjective:    Patient ID: [name], male    DOB: [birth year], 61 y.o.   MRN: [id number]  HPI: Romen Yutzy is a 61 y.o. male      Chief Complaint  Patient presents with  . Follow-up    6 mnth and hospital f/u; patient was in icu due to stomach bacteria    Patient is here for 6 month visit but also for hospital follow-up He was admitted on 05/10/16, discharged on 05/15/16 Diagnosed with septic shock, bowel obstruction; did not need surgery Had acute renal failure Consults included surgeon and nephrologist We reviewed labs and CT scan He was found to have hernias (see CT report) and is supposed to see surgeon after discharge Some shortness of breath but just getting back into doing things; wife on on FMLA to help him No abdominal pain now Continue on the cipro He was taken off of his BP medicine because of his low blood pressures; he can monitor at home Recommended f/u with Dr. Cherylann Ratel and [facility] after hospital discharge, but patient does not have appt with kidney doctor yet He was supposed to f/u with general surgeon, but they do not have an appt there either He is going to get in to see neurologist for just a visit instead of botox injection in January  He had a carotid scan that was ordered in June or July but patient says he never heard about the test We reviewed his CT scan note about aortic atherosclerosis; he is tolerating statin  Depression screen [facility] 2/9 05/20/2016 04/19/2016 01/12/2016 11/18/2015 10/06/2015  Decreased Interest 0 0 0 0 0  Down, Depressed, Hopeless 0 0 0 0 -  PHQ - 2 Score 0 0 0 0 0  Altered sleeping - - - - -  Tired, decreased energy  - - - - -  Change in appetite - - - - -  Feeling bad or failure about yourself  - - - - -  Trouble concentrating - - - - -  Moving slowly or fidgety/restless - - - - -  Suicidal thoughts - - - - -  PHQ-9 Score - - - - -   Relevant past medical, surgical, family and social history reviewed     Past Medical History:  Diagnosis Date  . Hyperlipidemia   . Hypertension   . Stroke [facility])         Past Surgical History:  Procedure Laterality Date  . KNEE ARTHROSCOPY Right    Family History  Problem Relation Age of Onset  . Hypertension Mother        Social History  Substance Use Topics  . Smoking status: Never Smoker  . Smokeless tobacco: Never Used  . Alcohol use No   Interim medical history since last visit reviewed. Allergies and medications reviewed  Review of Systems Per HPI unless specifically indicated above     Objective:  BP 134/82 (BP Location: Left Arm, Patient Position: Sitting, Cuff Size: Normal)   Pulse 89   Temp 98.3 F (36.8 C) (Oral)   Resp 14   Wt  229 lb 2 oz (103.9 kg)   SpO2 98%   BMI 28.64 kg/m      Wt Readings from Last 3 Encounters:  05/20/16 229 lb 2 oz (103.9 kg)  05/13/16 206 lb 9.1 oz (93.7 kg)  11/18/15 221 lb (100.2 kg)    Physical Exam  Constitutional: He appears well-developed and well-nourished. No distress.  Weight change noted, but patient does not appear fluid-overloaded  HENT:  Head: Normocephalic and atraumatic.  Eyes: EOM are normal. No scleral icterus.  Neck: Carotid bruit is not present. No thyromegaly present.  Central line site intact, no fluctuance, erythema, or drainage  Cardiovascular: Normal rate and regular rhythm.   Pulmonary/Chest: Effort normal and breath sounds normal.  Abdominal: Soft. Bowel sounds are normal. He exhibits no distension.  Musculoskeletal: He exhibits no edema.  Right AFO  Neurological: He is alert. He displays no tremor. Coordination normal.  Wearing right AFO; contracture of  right elbow; grip 2/5 right hand  Skin: Skin is warm and dry. No pallor.  Psychiatric: He has a normal mood and affect. His behavior is normal. Judgment and thought content normal. His mood appears not anxious. He does not exhibit a depressed mood.         Results for orders placed or performed during the hospital encounter of 05/10/16  Culture, blood (routine x 2)  Result Value Ref Range   Specimen Description BLOOD LT AC    Special Requests      BOTTLES DRAWN AEROBIC AND ANAEROBIC AER10CC, ANA10CC   Culture  Setup Time      GRAM POSITIVE COCCI IN BOTH AEROBIC AND ANAEROBIC BOTTLES CRITICAL RESULT CALLED TO, READ BACK BY AND VERIFIED WITH: FHEMA HALLAJI 05/11/16 @ 1812  MLK    Culture (A)     STAPHYLOCOCCUS SPECIES (COAGULASE NEGATIVE) THE SIGNIFICANCE OF ISOLATING THIS ORGANISM FROM A SINGLE SET OF BLOOD CULTURES WHEN MULTIPLE SETS ARE DRAWN IS UNCERTAIN. PLEASE NOTIFY THE MICROBIOLOGY DEPARTMENT WITHIN ONE WEEK IF SPECIATION AND SENSITIVITIES ARE REQUIRED. ANAEROBIC GRAM POSITIVE COCCI UNABLE TO IDENTIFY FURTHER DUE TO POOR VIABILITY Performed at [facility]    Report Status 05/19/2016 FINAL   Culture, blood (routine x 2)  Result Value Ref Range   Specimen Description BLOOD LEFT HAND    Special Requests      BOTTLES DRAWN AEROBIC AND ANAEROBIC AER ANA   Culture NO GROWTH 5 DAYS    Report Status 05/15/2016 FINAL   MRSA PCR Screening  Result Value Ref Range   MRSA by PCR NEGATIVE NEGATIVE  C difficile quick scan w PCR reflex  Result Value Ref Range   C Diff antigen NEGATIVE NEGATIVE   C Diff toxin NEGATIVE NEGATIVE   C Diff interpretation No C. difficile detected.   Urine culture  Result Value Ref Range   Specimen Description URINE, CATHETERIZED    Special Requests NONE    Culture NO GROWTH Performed at [facility]     Report Status 05/12/2016 FINAL   Blood Culture ID Panel (Reflexed)  Result  Value Ref Range   Enterococcus species NOT DETECTED NOT DETECTED   Listeria monocytogenes NOT DETECTED NOT DETECTED   Staphylococcus species DETECTED (A) NOT DETECTED   Staphylococcus aureus NOT DETECTED NOT DETECTED   Methicillin resistance NOT DETECTED NOT DETECTED   Streptococcus species NOT DETECTED NOT DETECTED   Streptococcus agalactiae NOT DETECTED NOT DETECTED   Streptococcus pneumoniae NOT DETECTED NOT DETECTED   Streptococcus pyogenes NOT DETECTED NOT DETECTED  Acinetobacter baumannii NOT DETECTED NOT DETECTED   Enterobacteriaceae species NOT DETECTED NOT DETECTED   Enterobacter cloacae complex NOT DETECTED NOT DETECTED   Escherichia coli NOT DETECTED NOT DETECTED   Klebsiella oxytoca NOT DETECTED NOT DETECTED   Klebsiella pneumoniae NOT DETECTED NOT DETECTED   Proteus species NOT DETECTED NOT DETECTED   Serratia marcescens NOT DETECTED NOT DETECTED   Haemophilus influenzae NOT DETECTED NOT DETECTED   Neisseria meningitidis NOT DETECTED NOT DETECTED   Pseudomonas aeruginosa NOT DETECTED NOT DETECTED   Candida albicans NOT DETECTED NOT DETECTED   Candida glabrata NOT DETECTED NOT DETECTED   Candida krusei NOT DETECTED NOT DETECTED   Candida parapsilosis NOT DETECTED NOT DETECTED   Candida tropicalis NOT DETECTED NOT DETECTED  Protime-INR  Result Value Ref Range   Prothrombin Time 13.3 11.4 - 15.2 seconds   INR 1.01   Troponin I  Result Value Ref Range   Troponin I <0.03 <0.03 ng/mL  Lactic acid, plasma  Result Value Ref Range   Lactic Acid, Venous 5.8 (HH) 0.5 - 1.9 mmol/L  Comprehensive metabolic panel  Result Value Ref Range   Sodium 133 (L) 135 - 145 mmol/L   Potassium 2.6 (LL) 3.5 - 5.1 mmol/L   Chloride 96 (L) 101 - 111 mmol/L   CO2 23 22 - 32 mmol/L   Glucose, Bld 233 (H) 65 - 99 mg/dL   BUN 19 6 - 20 mg/dL   Creatinine, Ser 4.092.09 (H) 0.61 - 1.24 mg/dL   Calcium 9.7 8.9 - 81.110.3 mg/dL   Total Protein 7.8 6.5 - 8.1 g/dL     Albumin 4.2 3.5 - 5.0 g/dL   AST 35 15 - 41 U/L   ALT 28 17 - 63 U/L   Alkaline Phosphatase 57 38 - 126 U/L   Total Bilirubin 0.7 0.3 - 1.2 mg/dL   GFR calc non Af Amer 33 (L) >60 mL/min   GFR calc Af Amer 38 (L) >60 mL/min   Anion gap 14 5 - 15  Ethanol  Result Value Ref Range   Alcohol, Ethyl (B) <5 <5 mg/dL  Urinalysis, Complete w Microscopic  Result Value Ref Range   Color, Urine YELLOW (A) YELLOW   APPearance CLEAR (A) CLEAR   Specific Gravity, Urine 1.014 1.005 - 1.030   pH 6.0 5.0 - 8.0   Glucose, UA NEGATIVE NEGATIVE mg/dL   Hgb urine dipstick NEGATIVE NEGATIVE   Bilirubin Urine NEGATIVE NEGATIVE   Ketones, ur NEGATIVE NEGATIVE mg/dL   Protein, ur NEGATIVE NEGATIVE mg/dL   Nitrite NEGATIVE NEGATIVE   Leukocytes, UA SMALL (A) NEGATIVE   RBC / HPF 0-5 0 - 5 RBC/hpf   WBC, UA 0-5 0 - 5 WBC/hpf   Bacteria, UA NONE SEEN NONE SEEN   Squamous Epithelial / LPF 0-5 (A) NONE SEEN   Mucous PRESENT   CBC  Result Value Ref Range   WBC 4.4 3.8 - 10.6 K/uL   RBC 1.79 (L) 4.40 - 5.90 MIL/uL   Hemoglobin 5.1 (L) 13.0 - 18.0 g/dL   HCT 91.415.7 (L) 78.240.0 - 95.652.0 %   MCV 87.5 80.0 - 100.0 fL   MCH 28.3 26.0 - 34.0 pg   MCHC 32.3 32.0 - 36.0 g/dL   RDW 21.313.8 08.611.5 - 57.814.5 %   Platelets 129 (L) 150 - 440 K/uL  CK  Result Value Ref Range   Total CK 366 49 - 397 U/L  TSH  Result Value Ref Range   TSH 11.396 (H) 0.350 -  4.500 uIU/mL  CBC with Differential/Platelet  Result Value Ref Range   WBC 10.5 3.8 - 10.6 K/uL   RBC 4.50 4.40 - 5.90 MIL/uL   Hemoglobin 12.7 (L) 13.0 - 18.0 g/dL   HCT 60.4 (L) 54.0 - 98.1 %   MCV 85.6 80.0 - 100.0 fL   MCH 28.1 26.0 - 34.0 pg   MCHC 32.9 32.0 - 36.0 g/dL   RDW 19.1 47.8 - 29.5 %   Platelets 286 150 - 440 K/uL   Neutrophils Relative % 91 %   Neutro Abs 9.5 (H) 1.4 - 6.5 K/uL   Lymphocytes Relative 8 %   Lymphs Abs 0.9 (L) 1.0 - 3.6 K/uL   Monocytes Relative 1 %   Monocytes Absolute 0.1 (L)  0.2 - 1.0 K/uL   Eosinophils Relative 0 %   Eosinophils Absolute 0.0 0 - 0.7 K/uL   Basophils Relative 0 %   Basophils Absolute 0.0 0 - 0.1 K/uL  CBC  Result Value Ref Range   WBC 14.7 (H) 3.8 - 10.6 K/uL   RBC 4.07 (L) 4.40 - 5.90 MIL/uL   Hemoglobin 11.6 (L) 13.0 - 18.0 g/dL   HCT 62.1 (L) 30.8 - 65.7 %   MCV 84.9 80.0 - 100.0 fL   MCH 28.6 26.0 - 34.0 pg   MCHC 33.7 32.0 - 36.0 g/dL   RDW 84.6 96.2 - 95.2 %   Platelets 257 150 - 440 K/uL  Magnesium  Result Value Ref Range   Magnesium 2.0 1.7 - 2.4 mg/dL  Phosphorus  Result Value Ref Range   Phosphorus 3.1 2.5 - 4.6 mg/dL  Glucose, capillary  Result Value Ref Range   Glucose-Capillary 106 (H) 65 - 99 mg/dL  Lactic acid, plasma  Result Value Ref Range   Lactic Acid, Venous 2.4 (HH) 0.5 - 1.9 mmol/L  Procalcitonin - Baseline  Result Value Ref Range   Procalcitonin 58.21 ng/mL  Procalcitonin  Result Value Ref Range   Procalcitonin 143.22 ng/mL  Protime-INR  Result Value Ref Range   Prothrombin Time 13.7 11.4 - 15.2 seconds   INR 1.05   Comprehensive metabolic panel  Result Value Ref Range   Sodium 136 135 - 145 mmol/L   Potassium 4.3 3.5 - 5.1 mmol/L   Chloride 104 101 - 111 mmol/L   CO2 24 22 - 32 mmol/L   Glucose, Bld 136 (H) 65 - 99 mg/dL   BUN 24 (H) 6 - 20 mg/dL   Creatinine, Ser 8.41 (H) 0.61 - 1.24 mg/dL   Calcium 8.3 (L) 8.9 - 10.3 mg/dL   Total Protein 6.5 6.5 - 8.1 g/dL   Albumin 3.7 3.5 - 5.0 g/dL   AST 42 (H) 15 - 41 U/L   ALT 29 17 - 63 U/L   Alkaline Phosphatase 39 38 - 126 U/L   Total Bilirubin 0.8 0.3 - 1.2 mg/dL   GFR calc non Af Amer 29 (L) >60 mL/min   GFR calc Af Amer 34 (L) >60 mL/min   Anion gap 8 5 - 15  Lactic acid, plasma  Result Value Ref Range   Lactic Acid, Venous 1.7 0.5 - 1.9 mmol/L  Potassium  Result Value Ref Range   Potassium 4.4 3.5 - 5.1 mmol/L  Glucose, capillary  Result Value Ref Range   Glucose-Capillary 123 (H) 65 - 99  mg/dL  Glucose, capillary  Result Value Ref Range   Glucose-Capillary 125 (H) 65 - 99 mg/dL  Glucose, capillary  Result Value Ref Range  Glucose-Capillary 103 (H) 65 - 99 mg/dL  Glucose, capillary  Result Value Ref Range   Glucose-Capillary 87 65 - 99 mg/dL  Procalcitonin  Result Value Ref Range   Procalcitonin 158.95 ng/mL  Basic metabolic panel  Result Value Ref Range   Sodium 138 135 - 145 mmol/L   Potassium 3.5 3.5 - 5.1 mmol/L   Chloride 110 101 - 111 mmol/L   CO2 22 22 - 32 mmol/L   Glucose, Bld 103 (H) 65 - 99 mg/dL   BUN 29 (H) 6 - 20 mg/dL   Creatinine, Ser 9.60 (H) 0.61 - 1.24 mg/dL   Calcium 7.7 (L) 8.9 - 10.3 mg/dL   GFR calc non Af Amer 21 (L) >60 mL/min   GFR calc Af Amer 24 (L) >60 mL/min   Anion gap 6 5 - 15  CBC  Result Value Ref Range   WBC 10.0 3.8 - 10.6 K/uL   RBC 3.83 (L) 4.40 - 5.90 MIL/uL   Hemoglobin 10.9 (L) 13.0 - 18.0 g/dL   HCT 45.4 (L) 09.8 - 11.9 %   MCV 84.3 80.0 - 100.0 fL   MCH 28.6 26.0 - 34.0 pg   MCHC 33.9 32.0 - 36.0 g/dL   RDW 14.7 82.9 - 56.2 %   Platelets 234 150 - 440 K/uL  Glucose, capillary  Result Value Ref Range   Glucose-Capillary 85 65 - 99 mg/dL  Glucose, capillary  Result Value Ref Range   Glucose-Capillary 105 (H) 65 - 99 mg/dL  Glucose, capillary  Result Value Ref Range   Glucose-Capillary 106 (H) 65 - 99 mg/dL  Glucose, capillary  Result Value Ref Range   Glucose-Capillary 82 65 - 99 mg/dL  Glucose, capillary  Result Value Ref Range   Glucose-Capillary 96 65 - 99 mg/dL  Glucose, capillary  Result Value Ref Range   Glucose-Capillary 87 65 - 99 mg/dL  Glucose, capillary  Result Value Ref Range   Glucose-Capillary 75 65 - 99 mg/dL  CBC  Result Value Ref Range   WBC 9.2 3.8 - 10.6 K/uL   RBC 4.22 (L) 4.40 - 5.90 MIL/uL   Hemoglobin 12.0 (L) 13.0 - 18.0 g/dL   HCT 13.0 (L) 86.5 - 78.4 %   MCV 84.3 80.0 - 100.0 fL   MCH 28.3 26.0 - 34.0 pg   MCHC 33.6 32.0 - 36.0  g/dL   RDW 69.6 29.5 - 28.4 %   Platelets 239 150 - 440 K/uL  Basic metabolic panel  Result Value Ref Range   Sodium 142 135 - 145 mmol/L   Potassium 3.7 3.5 - 5.1 mmol/L   Chloride 114 (H) 101 - 111 mmol/L   CO2 20 (L) 22 - 32 mmol/L   Glucose, Bld 84 65 - 99 mg/dL   BUN 26 (H) 6 - 20 mg/dL   Creatinine, Ser 1.32 (H) 0.61 - 1.24 mg/dL   Calcium 8.1 (L) 8.9 - 10.3 mg/dL   GFR calc non Af Amer 21 (L) >60 mL/min   GFR calc Af Amer 24 (L) >60 mL/min   Anion gap 8 5 - 15  Phosphorus  Result Value Ref Range   Phosphorus 3.5 2.5 - 4.6 mg/dL  Magnesium  Result Value Ref Range   Magnesium 2.1 1.7 - 2.4 mg/dL  Glucose, capillary  Result Value Ref Range   Glucose-Capillary 76 65 - 99 mg/dL  Glucose, capillary  Result Value Ref Range   Glucose-Capillary 80 65 - 99 mg/dL  Glucose, capillary  Result Value Ref  Range   Glucose-Capillary 79 65 - 99 mg/dL  Glucose, capillary  Result Value Ref Range   Glucose-Capillary 108 (H) 65 - 99 mg/dL  Glucose, capillary  Result Value Ref Range   Glucose-Capillary 81 65 - 99 mg/dL  Glucose, capillary  Result Value Ref Range   Glucose-Capillary 87 65 - 99 mg/dL  Glucose, capillary  Result Value Ref Range   Glucose-Capillary 108 (H) 65 - 99 mg/dL  Glucose, capillary  Result Value Ref Range   Glucose-Capillary 103 (H) 65 - 99 mg/dL  Glucose, capillary  Result Value Ref Range   Glucose-Capillary 102 (H) 65 - 99 mg/dL   Comment 1 Notify RN   Glucose, capillary  Result Value Ref Range   Glucose-Capillary 112 (H) 65 - 99 mg/dL   Comment 1 Notify RN   Glucose, capillary  Result Value Ref Range   Glucose-Capillary 106 (H) 65 - 99 mg/dL   Comment 1 Notify RN   Protein Electro, Random Urine  Result Value Ref Range   Total Protein, Urine 10.7 Not Estab. mg/dL   Albumin ELP, Urine 161.0 %   Alpha-1-Globulin, U 0.0 %   Alpha-2-Globulin, U 0.0 %   Beta Globulin, U 0.0 %   Gamma Globulin, U 0.0 %   M  Component, Ur Not Observed Not Observed %   PLEASE NOTE: Comment    PDF .   Protein / creatinine ratio, urine  Result Value Ref Range   Creatinine, Urine 126 mg/dL   Total Protein, Urine 13 mg/dL   Protein Creatinine Ratio 0.10 0.00 - 0.15 mg/mg[Cre]  Protein electrophoresis, serum  Result Value Ref Range   Total Protein ELP 5.3 (L) 6.0 - 8.5 g/dL   Albumin ELP 2.7 (L) 2.9 - 4.4 g/dL   RUEAV-4-UJWJXBJY 0.3 0.0 - 0.4 g/dL   NWGNF-6-OZHYQMVH 0.8 0.4 - 1.0 g/dL   Beta Globulin 0.7 0.7 - 1.3 g/dL   Gamma Globulin 0.8 0.4 - 1.8 g/dL   M-Spike, % Not Observed Not Observed g/dL   SPE Interp. Comment    Comment Comment    GLOBULIN, TOTAL 2.6 2.2 - 3.9 g/dL   A/G Ratio 1.0 0.7 - 1.7  Basic metabolic panel  Result Value Ref Range   Sodium 142 135 - 145 mmol/L   Potassium 3.5 3.5 - 5.1 mmol/L   Chloride 114 (H) 101 - 111 mmol/L   CO2 22 22 - 32 mmol/L   Glucose, Bld 125 (H) 65 - 99 mg/dL   BUN 23 (H) 6 - 20 mg/dL   Creatinine, Ser 8.46 (H) 0.61 - 1.24 mg/dL   Calcium 7.9 (L) 8.9 - 10.3 mg/dL   GFR calc non Af Amer 27 (L) >60 mL/min   GFR calc Af Amer 31 (L) >60 mL/min   Anion gap 6 5 - 15  Glucose, capillary  Result Value Ref Range   Glucose-Capillary 136 (H) 65 - 99 mg/dL  Glucose, capillary  Result Value Ref Range   Glucose-Capillary 124 (H) 65 - 99 mg/dL  Glucose, capillary  Result Value Ref Range   Glucose-Capillary 120 (H) 65 - 99 mg/dL  Glucose, capillary  Result Value Ref Range   Glucose-Capillary 122 (H) 65 - 99 mg/dL   Comment 1 Notify RN   Glucose, capillary  Result Value Ref Range   Glucose-Capillary 89 65 - 99 mg/dL   Comment 1 Notify RN   Type and screen [facility]  Result Value Ref Range   ABO/RH(D) A POS  Antibody Screen NEG    Sample Expiration 05/13/2016    Unit Number [id number]    Blood Component Type RCLI PHER 1    Unit division 00    Status of Unit REL FROM [facility]     Transfusion Status OK TO TRANSFUSE    Crossmatch Result Compatible    Unit Number [id number]    Blood Component Type RCLI PHER 2    Unit division 00    Status of Unit REL FROM [facility]    Transfusion Status OK TO TRANSFUSE    Crossmatch Result Compatible    Unit Number [id number]    Blood Component Type RED CELLS,LR    Unit division 00    Status of Unit REL FROM [facility]    Transfusion Status OK TO TRANSFUSE    Crossmatch Result Compatible    Unit Number [id number]    Blood Component Type RED CELLS,LR    Unit division 00    Status of Unit REL FROM [facility]    Transfusion Status OK TO TRANSFUSE    Crossmatch Result Compatible   Prepare RBC  Result Value Ref Range   Order Confirmation ORDER PROCESSED BY BLOOD BANK   ABO/Rh  Result Value Ref Range   ABO/RH(D) A POS       Assessment & Plan:      Problem List Items Addressed This Visit          Cardiovascular and Mediastinum   Hypertension goal BP (blood pressure) < 140/90    Controlled without any medicine; try DASH guidelines      Atherosclerosis of both carotid arteries    Re-order carotid US; avoid saturated fats; continue statin and aspirin      Abdominal aortic atherosclerosis (HCC)    Reviewed CT scan with him; showed model of atherosclerosis; discussed goal LDL under 70; continue statin         Digestive   SBO (small bowel obstruction)    Resolved, no blood or mucous         Nervous and Auditory   Spastic hemiplegia affecting dominant side (HCC)    Unchanged; pt to follow-up with neurologist in January         Genitourinary   Acute kidney injury (HCC)    Staying hydrated; check BMP today; refer to Dr. Cherylann Ratel; continue to hold BP meds, monitor at home, will defer to nephrologist to restart      Relevant Orders   Basic Metabolic Panel (BMET)   Ambulatory referral to Nephrology      Other   Hyperlipidemia LDL goal  <70    Goal LDL < 70; continue statin      Bilateral inguinal hernia without obstruction or gangrene    Refer to surgeon, go to ER if incarceration symptoms      Relevant Orders   Ambulatory referral to General Surgery      Follow up plan: Return in about 6 months (around 11/18/2016) for regular follow-up, but certainly sooner if needed.  An after-visit summary was printed and given to the patient at check-out.  Please see the patient instructions which may contain other information and recommendations beyond what is mentioned above in the assessment and plan.  No orders of the defined types were placed in this encounter.   Orders Placed This Encounter  Procedures  . Basic Metabolic Panel (BMET)  . Ambulatory referral to General Surgery  . Ambulatory referral to Nephrology      Electronically signed by Kerman Passey, MD  at 05/20/2016 2:36 PM      Office Visit on 05/20/2016        Detailed Report

## 2016-06-25 ENCOUNTER — Ambulatory Visit: Payer: Medicare HMO

## 2016-06-25 ENCOUNTER — Ambulatory Visit: Payer: Medicare HMO | Admitting: Anesthesiology

## 2016-06-25 ENCOUNTER — Encounter
Admission: RE | Disposition: A | Discharge status: Home / Self Care | Payer: Self-pay | Source: Ambulatory Visit | Attending: General Surgery

## 2016-06-25 ENCOUNTER — Ambulatory Visit
Admission: RE | Admit: 2016-06-25 | Discharge: 2016-06-25 | Disposition: A | Discharge status: Home / Self Care | Payer: Medicare HMO | Source: Ambulatory Visit | Attending: General Surgery | Admitting: General Surgery

## 2016-06-25 DIAGNOSIS — N189 Chronic kidney disease, unspecified: Secondary | ICD-10-CM | POA: Insufficient documentation

## 2016-06-25 DIAGNOSIS — Z8673 Personal history of transient ischemic attack (TIA), and cerebral infarction without residual deficits: Secondary | ICD-10-CM | POA: Diagnosis not present

## 2016-06-25 DIAGNOSIS — E785 Hyperlipidemia, unspecified: Secondary | ICD-10-CM | POA: Diagnosis not present

## 2016-06-25 DIAGNOSIS — Z0389 Encounter for observation for other suspected diseases and conditions ruled out: Secondary | ICD-10-CM | POA: Diagnosis not present

## 2016-06-25 DIAGNOSIS — E669 Obesity, unspecified: Secondary | ICD-10-CM | POA: Diagnosis not present

## 2016-06-25 DIAGNOSIS — T81509A Unspecified complication of foreign body accidentally left in body following unspecified procedure, initial encounter: Secondary | ICD-10-CM

## 2016-06-25 DIAGNOSIS — K4000 Bilateral inguinal hernia, with obstruction, without gangrene, not specified as recurrent: Secondary | ICD-10-CM | POA: Diagnosis not present

## 2016-06-25 DIAGNOSIS — I10 Essential (primary) hypertension: Secondary | ICD-10-CM | POA: Insufficient documentation

## 2016-06-25 DIAGNOSIS — Z7982 Long term (current) use of aspirin: Secondary | ICD-10-CM | POA: Insufficient documentation

## 2016-06-25 DIAGNOSIS — K4001 Bilateral inguinal hernia, with obstruction, without gangrene, recurrent: Secondary | ICD-10-CM

## 2016-06-25 DIAGNOSIS — K4020 Bilateral inguinal hernia, without obstruction or gangrene, not specified as recurrent: Secondary | ICD-10-CM | POA: Diagnosis not present

## 2016-06-25 DIAGNOSIS — Z79899 Other long term (current) drug therapy: Secondary | ICD-10-CM | POA: Diagnosis not present

## 2016-06-25 HISTORY — DX: Other mucopurulent conjunctivitis, unspecified eye: H10.029

## 2016-06-25 HISTORY — PX: INGUINAL HERNIA REPAIR: SHX194

## 2016-06-25 SURGERY — REPAIR, HERNIA, INGUINAL, BILATERAL, ADULT
Anesthesia: General | Laterality: Bilateral | Wound class: Clean

## 2016-06-25 MED ORDER — ACETAMINOPHEN 10 MG/ML IV SOLN
INTRAVENOUS | Status: DC | PRN
  Administered 2016-06-25: 1000 mg via INTRAVENOUS

## 2016-06-25 MED ORDER — OXYCODONE HCL 5 MG PO TABS
ORAL_TABLET | ORAL | Status: AC
  Filled 2016-06-25: qty 1

## 2016-06-25 MED ORDER — LACTATED RINGERS IV SOLN
INTRAVENOUS | Status: DC
  Administered 2016-06-25: 11:00:00 via INTRAVENOUS

## 2016-06-25 MED ORDER — BUPIVACAINE HCL (PF) 0.5 % IJ SOLN
INTRAMUSCULAR | Status: AC
Start: 2016-06-25 — End: 2016-06-25
  Filled 2016-06-25: qty 30

## 2016-06-25 MED ORDER — SUGAMMADEX SODIUM 500 MG/5ML IV SOLN
INTRAVENOUS | Status: AC
  Filled 2016-06-25: qty 5

## 2016-06-25 MED ORDER — FAMOTIDINE 20 MG PO TABS
20.0000 mg | ORAL_TABLET | Freq: Once | ORAL | Status: AC
  Administered 2016-06-25: 20 mg via ORAL

## 2016-06-25 MED ORDER — MIDAZOLAM HCL 2 MG/2ML IJ SOLN
INTRAMUSCULAR | Status: DC | PRN
  Administered 2016-06-25: 2 mg via INTRAVENOUS

## 2016-06-25 MED ORDER — OXYCODONE HCL 5 MG PO TABS: 5.0000 mg | ORAL_TABLET | Freq: Four times a day (QID) | ORAL | 0 refills | Status: DC | PRN

## 2016-06-25 MED ORDER — LIDOCAINE HCL (CARDIAC) 20 MG/ML IV SOLN
INTRAVENOUS | Status: DC | PRN
  Administered 2016-06-25: 50 mg via INTRAVENOUS

## 2016-06-25 MED ORDER — MEPERIDINE HCL 25 MG/ML IJ SOLN: 6.2500 mg | INTRAMUSCULAR | Status: DC | PRN

## 2016-06-25 MED ORDER — PROMETHAZINE HCL 25 MG/ML IJ SOLN: 6.2500 mg | INTRAMUSCULAR | Status: DC | PRN

## 2016-06-25 MED ORDER — FENTANYL CITRATE (PF) 100 MCG/2ML IJ SOLN
INTRAMUSCULAR | Status: AC
  Filled 2016-06-25: qty 2

## 2016-06-25 MED ORDER — FENTANYL CITRATE (PF) 100 MCG/2ML IJ SOLN
INTRAMUSCULAR | Status: DC | PRN
  Administered 2016-06-25: 100 ug via INTRAVENOUS

## 2016-06-25 MED ORDER — OXYCODONE HCL 5 MG/5ML PO SOLN: 5.0000 mg | Freq: Once | ORAL | Status: AC | PRN

## 2016-06-25 MED ORDER — ACETAMINOPHEN 10 MG/ML IV SOLN
INTRAVENOUS | Status: AC
  Filled 2016-06-25: qty 100

## 2016-06-25 MED ORDER — LACTATED RINGERS IV SOLN
INTRAVENOUS | Status: DC | PRN
  Administered 2016-06-25 (×2): via INTRAVENOUS

## 2016-06-25 MED ORDER — ROCURONIUM BROMIDE 100 MG/10ML IV SOLN
INTRAVENOUS | Status: DC | PRN
  Administered 2016-06-25: 20 mg via INTRAVENOUS
  Administered 2016-06-25: 50 mg via INTRAVENOUS

## 2016-06-25 MED ORDER — PROPOFOL 10 MG/ML IV BOLUS
INTRAVENOUS | Status: AC
  Filled 2016-06-25: qty 20

## 2016-06-25 MED ORDER — CEFAZOLIN SODIUM-DEXTROSE 2-4 GM/100ML-% IV SOLN
2.0000 g | INTRAVENOUS | Status: AC
  Administered 2016-06-25: 2 g via INTRAVENOUS

## 2016-06-25 MED ORDER — FENTANYL CITRATE (PF) 100 MCG/2ML IJ SOLN: 25.0000 ug | INTRAMUSCULAR | Status: DC | PRN

## 2016-06-25 MED ORDER — PROPOFOL 10 MG/ML IV BOLUS
INTRAVENOUS | Status: DC | PRN
  Administered 2016-06-25: 160 mg via INTRAVENOUS

## 2016-06-25 MED ORDER — LIDOCAINE HCL (PF) 1 % IJ SOLN
INTRAMUSCULAR | Status: AC
  Filled 2016-06-25: qty 30

## 2016-06-25 MED ORDER — SUGAMMADEX SODIUM 500 MG/5ML IV SOLN
INTRAVENOUS | Status: DC | PRN
  Administered 2016-06-25: 210 mg via INTRAVENOUS

## 2016-06-25 MED ORDER — OXYCODONE HCL 5 MG PO TABS
5.0000 mg | ORAL_TABLET | Freq: Once | ORAL | Status: AC | PRN
  Administered 2016-06-25: 5 mg via ORAL

## 2016-06-25 MED ORDER — BUPIVACAINE HCL 0.5 % IJ SOLN
INTRAMUSCULAR | Status: DC | PRN
  Administered 2016-06-25: 60 mL

## 2016-06-25 MED ORDER — MIDAZOLAM HCL 2 MG/2ML IJ SOLN
INTRAMUSCULAR | Status: AC
  Filled 2016-06-25: qty 2

## 2016-06-25 MED ORDER — FAMOTIDINE 20 MG PO TABS
ORAL_TABLET | ORAL | Status: AC
  Administered 2016-06-25: 20 mg via ORAL
  Filled 2016-06-25: qty 1

## 2016-06-25 MED ORDER — PHENYLEPHRINE HCL 10 MG/ML IJ SOLN
INTRAMUSCULAR | Status: DC | PRN
  Administered 2016-06-25 (×7): 100 ug via INTRAVENOUS

## 2016-06-25 MED ORDER — ONDANSETRON HCL 4 MG/2ML IJ SOLN
INTRAMUSCULAR | Status: AC
  Filled 2016-06-25: qty 2

## 2016-06-25 MED ORDER — CEFAZOLIN SODIUM-DEXTROSE 2-4 GM/100ML-% IV SOLN
INTRAVENOUS | Status: AC
  Administered 2016-06-25: 2 g via INTRAVENOUS
  Filled 2016-06-25: qty 100

## 2016-06-25 MED ORDER — ONDANSETRON HCL 4 MG/2ML IJ SOLN
INTRAMUSCULAR | Status: DC | PRN
  Administered 2016-06-25: 4 mg via INTRAVENOUS

## 2016-06-25 MED ORDER — CHLORHEXIDINE GLUCONATE CLOTH 2 % EX PADS
6.0000 | MEDICATED_PAD | Freq: Once | CUTANEOUS | Status: AC
  Administered 2016-06-25: 6 via TOPICAL

## 2016-06-25 SURGICAL SUPPLY — 38 items
ADH SKN CLS APL DERMABOND .7 (GAUZE/BANDAGES/DRESSINGS) ×2
BLADE SURG 15 STRL SS SAFETY (BLADE) ×2 IMPLANT
CANISTER SUCT 1200ML W/VALVE (MISCELLANEOUS) ×2 IMPLANT
CHLORAPREP W/TINT 26ML (MISCELLANEOUS) ×2 IMPLANT
DECANTER SPIKE VIAL GLASS SM (MISCELLANEOUS) ×4 IMPLANT
DERMABOND ADVANCED (GAUZE/BANDAGES/DRESSINGS) ×2
DERMABOND ADVANCED .7 DNX12 (GAUZE/BANDAGES/DRESSINGS) ×1 IMPLANT
DRAIN PENROSE 1/4X12 LTX (DRAIN) ×2 IMPLANT
DRAPE LAPAROTOMY 100X77 ABD (DRAPES) ×2 IMPLANT
DRAPE LAPAROTOMY TRNSV 106X77 (MISCELLANEOUS) ×1 IMPLANT
ELECT CAUTERY BLADE 6.4 (BLADE) ×1 IMPLANT
ELECT REM PT RETURN 9FT ADLT (ELECTROSURGICAL)
ELECTRODE REM PT RTRN 9FT ADLT (ELECTROSURGICAL) ×1 IMPLANT
GLOVE BIO SURGEON STRL SZ7 (GLOVE) ×10 IMPLANT
GOWN STRL REUS W/ TWL LRG LVL3 (GOWN DISPOSABLE) ×2 IMPLANT
GOWN STRL REUS W/TWL LRG LVL3 (GOWN DISPOSABLE) ×8
KIT RM TURNOVER STRD PROC AR (KITS) ×2 IMPLANT
LABEL OR SOLS (LABEL) ×2 IMPLANT
MESH PARIETEX PROGRIP LEFT (Mesh General) ×1 IMPLANT
MESH PARIETEX PROGRIP RIGHT (Mesh General) ×1 IMPLANT
NDL HPO THNWL 1X22GA REG BVL (NEEDLE) ×1 IMPLANT
NDL HYPO 25X1 1.5 SAFETY (NEEDLE) ×1 IMPLANT
NEEDLE HYPO 25X1 1.5 SAFETY (NEEDLE) ×2 IMPLANT
NEEDLE SAFETY 22GX1 (NEEDLE) ×2
NS IRRIG 500ML POUR BTL (IV SOLUTION) ×2 IMPLANT
PACK BASIN MINOR ARMC (MISCELLANEOUS) ×2 IMPLANT
SPONGE LAP 18X18 5 PK (GAUZE/BANDAGES/DRESSINGS) ×1 IMPLANT
SUT PDS 2-0 27IN (SUTURE) ×4 IMPLANT
SUT VIC AB 2-0 SH 27 (SUTURE) ×8
SUT VIC AB 2-0 SH 27XBRD (SUTURE) ×1 IMPLANT
SUT VIC AB 3-0 54X BRD REEL (SUTURE) ×1 IMPLANT
SUT VIC AB 3-0 BRD 54 (SUTURE) ×2
SUT VIC AB 3-0 SH 27 (SUTURE) ×4
SUT VIC AB 3-0 SH 27X BRD (SUTURE) ×1 IMPLANT
SUT VIC AB 4-0 FS2 27 (SUTURE) ×3 IMPLANT
SYR CONTROL 10ML (SYRINGE) ×2 IMPLANT
TAPE TRANSPORE STRL 2 31045 (GAUZE/BANDAGES/DRESSINGS) ×1 IMPLANT
WATER STERILE IRR 1000ML POUR (IV SOLUTION) ×1 IMPLANT

## 2016-06-25 NOTE — Interval H&P Note (Signed)
History and Physical Interval Note:  06/25/2016 11:17 AM  [name]  has presented today for surgery, with the diagnosis of BILATERAL HERNIAS  The various methods of treatment have been discussed with the patient and family. After consideration of risks, benefits and other options for treatment, the patient has consented to  Procedure(s): HERNIA REPAIR INGUINAL ADULT BILATERAL (Bilateral) as a surgical intervention .  The patient's history has been reviewed, patient examined, no change in status, stable for surgery.  I have reviewed the patient's chart and labs.  Questions were answered to the patient's satisfaction.     [name] G

## 2016-06-25 NOTE — Transfer of Care (Signed)
Immediate Anesthesia Transfer of Care Note  Patient: [name]  Procedure(s) Performed: Procedure(s): HERNIA REPAIR INGUINAL ADULT BILATERAL (Bilateral)  Patient Location: PACU  Anesthesia Type:General  Level of Consciousness: sedated  Airway & Oxygen Therapy: Patient Spontanous Breathing and Patient connected to face mask oxygen  Post-op Assessment: Report given to RN and Post -op Vital signs reviewed and stable  Post vital signs: Reviewed and stable  Last Vitals:  Vitals:   06/25/16 1037 06/25/16 1432  BP: (!) 161/77 86/62  Pulse: 90 68  Resp: 20 18  Temp: 36.8 C 36.8 C    Last Pain:  Vitals:   06/25/16 1037  TempSrc: Tympanic         Complications: No apparent anesthesia complications

## 2016-06-25 NOTE — Anesthesia Procedure Notes (Signed)
Procedure Name: Intubation Date/Time: 06/25/2016 11:50 AM Performed by: [id number], Nesha Counihan Pre-anesthesia Checklist: Patient identified, Emergency Drugs available, Suction available, Timeout performed and Patient being monitored Patient Re-evaluated:Patient Re-evaluated prior to inductionOxygen Delivery Method: Circle system utilized Preoxygenation: Pre-oxygenation with 100% oxygen Intubation Type: IV induction Laryngoscope Size: Mac and 4 Grade View: Grade I Tube type: Oral Tube size: 7.5 mm Number of attempts: 1 Airway Equipment and Method: Stylet Placement Confirmation: ETT inserted through vocal cords under direct vision,  positive ETCO2,  breath sounds checked- equal and bilateral and CO2 detector Secured at: 23 cm Tube secured with: Tape

## 2016-06-25 NOTE — Anesthesia Postprocedure Evaluation (Signed)
Anesthesia Post Note  Patient: [name]  Procedure(s) Performed: Procedure(s) (LRB): HERNIA REPAIR INGUINAL ADULT BILATERAL (Bilateral)  Patient location during evaluation: PACU Anesthesia Type: General Level of consciousness: awake and alert Pain management: pain level controlled Vital Signs Assessment: post-procedure vital signs reviewed and stable Respiratory status: spontaneous breathing and respiratory function stable Cardiovascular status: stable Anesthetic complications: no     Last Vitals:  Vitals:   06/25/16 1432 06/25/16 1433  BP: 91/60   Pulse: 68 65  Resp: 18 19  Temp: 36.8 C     Last Pain:  Vitals:   06/25/16 1432  TempSrc:   PainSc: Asleep                 Armari Fussell K

## 2016-06-25 NOTE — Discharge Instructions (Signed)
Open Hernia Repair, Adult, Care After These instructions give you information about caring for yourself after your procedure. Your doctor may also give you more specific instructions. If you have problems or questions, contact your doctor. Follow these instructions at home: Surgical cut (incision) care   Follow instructions from your doctor about how to take care of your surgical cut area. Make sure you:  Wash your hands with soap and water before you change your bandage (dressing). If you cannot use soap and water, use hand sanitizer.  Change your bandage as told by your doctor.  Leave stitches (sutures), skin glue, or skin tape (adhesive) strips in place. They may need to stay in place for 2 weeks or longer. If tape strips get loose and curl up, you may trim the loose edges. Do not remove tape strips completely unless your doctor says it is okay.  Check your surgical cut every day for signs of infection. Check for:  More redness, swelling, or pain.  More fluid or blood.  Warmth.  Pus or a bad smell. Activity  Do not drive or use heavy machinery while taking prescription pain medicine. Do not drive until your doctor says it is okay.  Until your doctor says it is okay:  Do not lift anything that is heavier than 10 lb (4.5 kg).  Do not play contact sports.  Return to your normal activities as told by your doctor. Ask your doctor what activities are safe. General instructions  To prevent or treat having a hard time pooping (constipation) while you are taking prescription pain medicine, your doctor may recommend that you:  Drink enough fluid to keep your pee (urine) clear or pale yellow.  Take over-the-counter or prescription medicines.  Eat foods that are high in fiber, such as fresh fruits and vegetables, whole grains, and beans.  Limit foods that are high in fat and processed sugars, such as fried and sweet foods.  Take over-the-counter and prescription medicines only as  told by your doctor.  Do not take baths, swim, or use a hot tub until your doctor says it is okay.  Keep all follow-up visits as told by your doctor. This is important. Contact a doctor if:  You develop a rash.  You have more redness, swelling, or pain around your surgical cut.  You have more fluid or blood coming from your surgical cut.  Your surgical cut feels warm to the touch.  You have pus or a bad smell coming from your surgical cut.  You have a fever or chills.  You have blood in your poop (stool).  You have not pooped in 2-3 days.  Medicine does not help your pain. Get help right away if:  You have chest pain or you are short of breath.  You feel light-headed.  You feel weak and dizzy (feel faint).  You have very bad pain.  You throw up (vomit) and your pain is worse. This information is not intended to replace advice given to you by your health care provider. Make sure you discuss any questions you have with your health care provider. Document Released: 05/31/2014 Document Revised: 11/28/2015 Document Reviewed: 10/22/2015 Elsevier Interactive Patient Education  2017 Elsevier Inc.   General Anesthesia, Adult, Care After These instructions provide you with information about caring for yourself after your procedure. Your health care provider may also give you more specific instructions. Your treatment has been planned according to current medical practices, but problems sometimes occur. Call your health care provider  if you have any problems or questions after your procedure. What can I expect after the procedure? After the procedure, it is common to have:  Vomiting.  A sore throat.  Mental slowness. It is common to feel:  Nauseous.  Cold or shivery.  Sleepy.  Tired.  Sore or achy, even in parts of your body where you did not have surgery. Follow these instructions at home: For at least 24 hours after the procedure:  Do not:  Participate in  activities where you could fall or become injured.  Drive.  Use heavy machinery.  Drink alcohol.  Take sleeping pills or medicines that cause drowsiness.  Make important decisions or sign legal documents.  Take care of children on your own.  Rest. Eating and drinking  If you vomit, drink water, juice, or soup when you can drink without vomiting.  Drink enough fluid to keep your urine clear or pale yellow.  Make sure you have little or no nausea before eating solid foods.  Follow the diet recommended by your health care provider. General instructions  Have a responsible adult stay with you until you are awake and alert.  Return to your normal activities as told by your health care provider. Ask your health care provider what activities are safe for you.  Take over-the-counter and prescription medicines only as told by your health care provider.  If you smoke, do not smoke without supervision.  Keep all follow-up visits as told by your health care provider. This is important. Contact a health care provider if:  You continue to have nausea or vomiting at home, and medicines are not helpful.  You cannot drink fluids or start eating again.  You cannot urinate after 8-12 hours.  You develop a skin rash.  You have fever.  You have increasing redness at the site of your procedure. Get help right away if:  You have difficulty breathing.  You have chest pain.  You have unexpected bleeding.  You feel that you are having a life-threatening or urgent problem. This information is not intended to replace advice given to you by your health care provider. Make sure you discuss any questions you have with your health care provider. Document Released: 08/16/2000 Document Revised: 10/13/2015 Document Reviewed: 04/24/2015 Elsevier Interactive Patient Education  2017 [employer]

## 2016-06-25 NOTE — Anesthesia Post-op Follow-up Note (Cosign Needed)
Anesthesia QCDR form completed.        

## 2016-06-25 NOTE — Op Note (Signed)
Preop diagnosis: Bilateral large scrotal hernias  Post op diagnosis: Same. Left side was a sliding hernia  Operation: Open repair of bilateral large scrotal hernias  Surgeon: Kathreen CosierS. G. Sankar  Assistant: Laneta SimmersJamie Benson RN, FA  Anesthesia: Gen.  Complications: None  EBL: 2550 mL  Drains: None  Description: Patient was put to sleep in supine position the operating table regions and the scrotum and on both sides were prepped and draped sterile field. Timeout was performed. Skin markings were then placed over the anterior superior iliac spine and the pubic tubercle on both sides and the incision was mapped out on both sides. The right side was operated on first the incision was made and deepened through the layers down to the external oblique and bleeding controlled cautery and ligatures of 3-0 Vicryl. The external oblique O was opened along the line of its fibers to expose the inguinal canal. Ilioinguinal nerve was identified was freed and preserved. The cord was markedly thickened but got of the present the hernia containing small bowel. With careful exposure the cord was freed and in the fascia covering his was incised. Further dissection was performed until the sac was identified which was then opened to reveal the presence of small bowel with pressure from the scrotum about 2-3 feet of small bowel was reduced from the scrotum and subsequently pushed into the abdominal cavity through the internal ring area. The sac was then dissected off from the surrounding cord structures with preservation of the vas and the vasculature. The sac was then suture closed with a running stitch of 2-0 Vicryl at the internal ring area and the excess excised out. The suture was used to tack this to the undersurface of the internal oblique muscle. Following this the posterior wall was satisfactorily freed and exposed. The fascia covering the cord was reapproximated with interrupted 2-0 Vicryl. A Progrip was then placed around  the cord and laid down against the posterior wall. This covered the entire posterior wall and out laterally underneath the external oblique. The mesh was tacked to the pubic tubercle with a single 2-0 PDS stitch. Wound was irrigated and closed. External oblique was closed with a running 3 2-0 PDS. Subcutaneous tissue closed with 3-0 Vicryl and skin with subcuticular 4-0 Vicryl. 30 mL of 0.5% Marcaine mixed with Xylocaine was used around the incision for postop analgesia the same amount being used on the left side also. The left inguinal region was then dealt with in a similar fashion. After skin incision was made and deepened into the inguinal canal area further dissection revealed the patient in fact had a sliding hernia containing sigmoid colon with careful exposure the sigmoid colon that was herniated was pushed back into the peritoneal cavity and the redundant sac was excised out and was closed with a running 2-0 Vicryl stitch. Although this was an indirect type hernia. Did week in the posterior wall significantly in the lateral half of it. Accordingly the transversalis fascia was used to plicate the weakness with a running stitch of 2-0 Vicryl. Mesh repair with the use of Progrip mesh was accomplished  similar to the right side. The wound was closed similar to the right side both incisions were covered with the Dermabond patient subsequently returned recovery room stable condition

## 2016-06-25 NOTE — H&P (View-Only) (Signed)
Patient ID: [name], male   DOB: 1955/08/26, 61 y.o.   MRN: [id number]  Chief Complaint  Patient presents with  . Hernia    HPI [name] is a 61 y.o. male.  Patient here today for an evaluation of a hernia.  He states that he have noticed swelling bilaterally for about 2-3 months with the right side worse.  It seems to be causing some lower abdominal tenderness.  No nausea, vomiting, constipation or diarrhea noted. About a month ago he was admitted to the hospital with significant abdominal complaints. Suspected to be a viral gastroenteritis. He has since been feeling better. He is here today with his wife. I have reviewed the history of present illness with the patient.  HPI  Past Medical History:  Diagnosis Date  . Hyperlipidemia   . Hypertension   . Stroke [name]) 12/24/2013   right side weakness    Past Surgical History:  Procedure Laterality Date  . KNEE ARTHROSCOPY Right 1970's    Family History  Problem Relation Age of Onset  . Hypertension Mother     Social History Social History  Substance Use Topics  . Smoking status: Never Smoker  . Smokeless tobacco: Never Used  . Alcohol use No    No Known Allergies  Current Outpatient Prescriptions  Medication Sig Dispense Refill  . amLODipine (NORVASC) 10 MG tablet Take 1 tablet (10 mg total) by mouth daily. 30 tablet 0  . aspirin EC 325 MG tablet Take 1 tablet (325 mg total) by mouth daily. 90 tablet 3  . atorvastatin (LIPITOR) 80 MG tablet Take 1 tablet (80 mg total) by mouth daily. (new cholesterol medicine, stop the pravastatin) (Patient taking differently: Take 80 mg by mouth at bedtime. (new cholesterol medicine, stop the pravastatin)) 30 tablet 1  . Multiple Vitamins-Minerals (ONE-A-DAY MENS HEALTH FORMULA) TABS Take 1 tablet by mouth daily.    . Omega-3 Fatty Acids (FISH OIL) 1000 MG CAPS Take 1,000 mg by mouth daily.     No current facility-administered medications for this visit.     Review of  Systems Review of Systems  Blood pressure (!) 168/88, pulse 90, resp. rate 16, height 5' 11.5" (1.816 m), weight 228 lb (103.4 kg).  Physical Exam Physical Exam  Constitutional: He is oriented to person, place, and time. He appears well-developed and well-nourished.  Eyes: Conjunctivae are normal. No scleral icterus.  Neck: Neck supple.  Cardiovascular: Normal rate, regular rhythm and normal heart sounds.   Pulmonary/Chest: Effort normal and breath sounds normal.  Abdominal: Soft. Normal appearance and bowel sounds are normal. There is tenderness. A hernia is present. Hernia confirmed positive in the right inguinal area and confirmed positive in the left inguinal area.  Pt has complete scrotal hernias on both sides, right side much larger than left  Lymphadenopathy:    He has no cervical adenopathy.  Neurological: He is alert and oriented to person, place, and time.  Skin: Skin is warm and dry.  Psychiatric: His behavior is normal.    Data Reviewed Progress notes CT scan done during last hospital admission was reviewed. Bilateral inguinal hernias are noted. Right side has loops of small bowel with suspicion of area of partial obstruction. Left side has a good portion of the sigmoid colon in the hernia.   Assessment    Bilateral large inguinal hernias, loops of small bowel in right and part of colon on left. By CT, there appears to be some partial obstruction in the small intestine.  Plan    Recommended repair of both hernias. This is best accomplished by open technique, since it will give a better assessment of the herniated bowel on both sides. Procedure was explained to him, including risks. He is agreeable.   Hernia precautions and incarceration were discussed with the patient. If they develop symptoms of an incarcerated hernia, they were encouraged to seek prompt medical attention.  I have recommended repair of the hernia using mesh on an outpatient basis in the near  future. The risk of infection was reviewed. The role of prosthetic mesh to minimize the risk of recurrence was reviewed.  The patient is scheduled for [facility] on [facility]25/16. He will pre admit by phone. The patient is aware of date and instructions.   This information has been scribed by Dorathy DaftMarsha Hatch RN, BSN,BC.   SANKAR,SEEPLAPUTHUR G 06/02/2016, 10:47 AM

## 2016-06-25 NOTE — Anesthesia Preprocedure Evaluation (Signed)
Anesthesia Evaluation  Patient identified by MRN, date of birth, ID band Patient awake    Reviewed: Allergy & Precautions, NPO status , Patient's Chart, lab work & pertinent test results  History of Anesthesia Complications Negative for: history of anesthetic complications  Airway Mallampati: II  TM Distance: >3 FB Neck ROM: Full    Dental  (+) Edentulous Upper, Edentulous Lower   Pulmonary neg pulmonary ROS, neg sleep apnea, neg COPD,    breath sounds clear to auscultation- rhonchi (-) wheezing      Cardiovascular Exercise Tolerance: Good hypertension, Pt. on medications (-) CAD and (-) Past MI  Rhythm:Regular Rate:Normal - Systolic murmurs and - Diastolic murmurs    Neuro/Psych CVA (R sided weakness), Residual Symptoms negative psych ROS   GI/Hepatic negative GI ROS, Neg liver ROS,   Endo/Other  negative endocrine ROSneg diabetes  Renal/GU Renal InsufficiencyRenal disease     Musculoskeletal negative musculoskeletal ROS (+)   Abdominal (+) + obese,   Peds  Hematology negative hematology ROS (+)   Anesthesia Other Findings Past Medical History: No date: Bowel obstruction No date: Chronic kidney disease No date: Hyperlipidemia No date: Hypertension 06/21/2016: Pink eye No date: Sepsis (HCC) 12/24/2013: Stroke (HCC)     Comment: right side weakness   Reproductive/Obstetrics                             Anesthesia Physical Anesthesia Plan  ASA: III  Anesthesia Plan: General   Post-op Pain Management:    Induction: Intravenous  Airway Management Planned: Oral ETT  Additional Equipment:   Intra-op Plan:   Post-operative Plan: Extubation in OR  Informed Consent: I have reviewed the patients History and Physical, chart, labs and discussed the procedure including the risks, benefits and alternatives for the proposed anesthesia with the patient or authorized representative who  has indicated his/her understanding and acceptance.   Dental advisory given  Plan Discussed with: CRNA and Anesthesiologist  Anesthesia Plan Comments:         Anesthesia Quick Evaluation 14

## 2016-06-28 ENCOUNTER — Encounter: Payer: Self-pay | Admitting: General Surgery

## 2016-06-29 NOTE — Progress Notes (Deleted)
06/30/2016 8:45 PM   Nelva NayAnthony Donoso 09/01/1955 [id number]  Referring provider: Kerman PasseyMelinda P Lada, MD [address] [place], [place] [id number]  No chief complaint on file.   HPI: ***     PMH: Past Medical History:  Diagnosis Date  . Bowel obstruction   . Chronic kidney disease   . Hyperlipidemia   . Hypertension   . Pink eye 06/21/2016  . Sepsis (HCC)   . Stroke [facility](HCC) 12/24/2013   right side weakness    Surgical History: Past Surgical History:  Procedure Laterality Date  . INGUINAL HERNIA REPAIR Bilateral 06/25/2016   Procedure: HERNIA REPAIR INGUINAL ADULT BILATERAL;  Surgeon: Kieth BrightlySeeplaputhur G Sankar, MD;  Location: [facility] ORS;  Service: General;  Laterality: Bilateral;  . KNEE ARTHROSCOPY Right 1970's    Home Medications:  Allergies as of 06/30/2016   No Known Allergies     Medication List       Accurate as of 06/29/16  8:45 PM. Always use your most recent med list.          amLODipine 10 MG tablet Commonly known as:  NORVASC TAKE 1 TABLET (10 MG TOTAL) BY MOUTH DAILY.   aspirin EC 325 MG tablet Take 1 tablet (325 mg total) by mouth daily.   atorvastatin 80 MG tablet Commonly known as:  LIPITOR Take 1 tablet (80 mg total) by mouth daily. (new cholesterol medicine, stop the pravastatin)   clindamycin 300 MG capsule Commonly known as:  CLEOCIN Take 300 mg by mouth 3 (three) times daily.   Fish Oil 1000 MG Caps Take 1,000 mg by mouth daily.   ofloxacin 0.3 % ophthalmic solution Commonly known as:  OCUFLOX Place 1 drop into both eyes 3 (three) times daily.   ONE-A-DAY MENS HEALTH FORMULA Tabs Take 1 tablet by mouth daily.   oxyCODONE 5 MG immediate release tablet Commonly known as:  Oxy IR/ROXICODONE Take 1-2 tablets (5-10 mg total) by mouth every 6 (six) hours as needed for severe pain.       Allergies: No Known Allergies  Family History: Family History  Problem Relation Age of Onset  . Hypertension Mother     Social History:   reports that he has never smoked. He has never used smokeless tobacco. He reports that he uses drugs, including Marijuana. He reports that he does not drink alcohol.  ROS:                                        Physical Exam: There were no vitals taken for this visit.  Constitutional:  Alert and oriented, No acute distress. HEENT: [place] AT, moist mucus membranes.  Trachea midline, no masses. Cardiovascular: No clubbing, cyanosis, or edema. Respiratory: Normal respiratory effort, no increased work of breathing. GI: Abdomen is soft, nontender, nondistended, no abdominal masses GU: No CVA tenderness. *** Skin: No rashes, bruises or suspicious lesions. Lymph: No cervical or inguinal adenopathy. Neurologic: Grossly intact, no focal deficits, moving all 4 extremities. Psychiatric: Normal mood and affect.  Laboratory Data: Lab Results  Component Value Date   WBC 9.2 05/13/2016   HGB 12.0 (L) 05/13/2016   HCT 35.6 (L) 05/13/2016   MCV 84.3 05/13/2016   PLT 239 05/13/2016    Lab Results  Component Value Date   CREATININE 1.91 (H) 05/20/2016    No results found for: PSA  No results found for: TESTOSTERONE  Lab Results  Component Value Date   HGBA1C 6.0 (H) 11/18/2015    Urinalysis    Component Value Date/Time   COLORURINE YELLOW (A) 05/10/2016 2148   APPEARANCEUR CLEAR (A) 05/10/2016 2148   APPEARANCEUR Clear 12/25/2013 0227   LABSPEC 1.014 05/10/2016 2148   LABSPEC 1.013 12/25/2013 0227   PHURINE 6.0 05/10/2016 2148   GLUCOSEU NEGATIVE 05/10/2016 2148   GLUCOSEU Negative 12/25/2013 0227   HGBUR NEGATIVE 05/10/2016 2148   BILIRUBINUR NEGATIVE 05/10/2016 2148   BILIRUBINUR Negative 12/25/2013 0227   KETONESUR NEGATIVE 05/10/2016 2148   PROTEINUR NEGATIVE 05/10/2016 2148   NITRITE NEGATIVE 05/10/2016 2148   LEUKOCYTESUR SMALL (A) 05/10/2016 2148   LEUKOCYTESUR Negative 12/25/2013 0227    Pertinent Imaging: ***  Assessment & Plan:   ***  There are no diagnoses linked to this encounter.  No Follow-up on file.  Vanna Scotland, MD  [facility] [address] [place], [place] [address] [phone]

## 2016-06-30 ENCOUNTER — Ambulatory Visit: Payer: BLUE CROSS/BLUE SHIELD | Admitting: Urology

## 2016-07-02 ENCOUNTER — Other Ambulatory Visit: Payer: Self-pay | Admitting: Family Medicine

## 2016-07-02 NOTE — Telephone Encounter (Signed)
Please remind patient to come in for labs They expire on 07/10/16, so we'd love if he could come in before then Thank you I'll send in Rx

## 2016-07-05 NOTE — Telephone Encounter (Signed)
Patient notified states he will come in this week

## 2016-07-06 ENCOUNTER — Other Ambulatory Visit: Payer: Self-pay | Admitting: Family Medicine

## 2016-07-06 ENCOUNTER — Encounter: Payer: Self-pay | Admitting: General Surgery

## 2016-07-06 ENCOUNTER — Ambulatory Visit (INDEPENDENT_AMBULATORY_CARE_PROVIDER_SITE_OTHER): Payer: Medicare HMO | Admitting: General Surgery

## 2016-07-06 VITALS — BP 144/86 | HR 86 | Resp 14 | Ht 71.5 in | Wt 221.0 lb

## 2016-07-06 DIAGNOSIS — Z5181 Encounter for therapeutic drug level monitoring: Secondary | ICD-10-CM | POA: Diagnosis not present

## 2016-07-06 DIAGNOSIS — K4001 Bilateral inguinal hernia, with obstruction, without gangrene, recurrent: Secondary | ICD-10-CM

## 2016-07-06 DIAGNOSIS — I6523 Occlusion and stenosis of bilateral carotid arteries: Secondary | ICD-10-CM

## 2016-07-06 LAB — LIPID PANEL
CHOLESTEROL: 149 mg/dL (ref <200)
HDL: 39 mg/dL — ABNORMAL LOW (ref >40)
LDL Cholesterol: 99 mg/dL (ref <100)
Total CHOL/HDL Ratio: 3.8 Ratio (ref <5.0)
Triglycerides: 56 mg/dL (ref <150)
VLDL: 11 mg/dL (ref <30)

## 2016-07-06 LAB — ALT: ALT: 45 U/L (ref 9–46)

## 2016-07-06 NOTE — Progress Notes (Signed)
Patient ID: [name], male   DOB: 12/29/1955, 61 y.o.   MRN: [id number]  Chief Complaint  Patient presents with  . Routine Post Op    HPI Thomas Naynthony Dick is a 61 y.o. male.  Here today for postoperative visit, bilateral inguinal hernia on 06-25-16, he states he is doing well. Denies any gastrointestinal issues, bowels are moving regular. He is wearing his scrotal support. I have reviewed the history of present illness with the patient.   HPI  Past Medical History:  Diagnosis Date  . Bowel obstruction   . Chronic kidney disease   . Hyperlipidemia   . Hypertension   . Pink eye 06/21/2016  . Sepsis (HCC)   . Stroke [facility](HCC) 12/24/2013   right side weakness    Past Surgical History:  Procedure Laterality Date  . INGUINAL HERNIA REPAIR Bilateral 06/25/2016   Procedure: HERNIA REPAIR INGUINAL ADULT BILATERAL;  Surgeon: Kieth BrightlySeeplaputhur G Rutha Melgoza, MD;  Location: [facility] ORS;  Service: General;  Laterality: Bilateral;  . KNEE ARTHROSCOPY Right 1970's    Family History  Problem Relation Age of Onset  . Hypertension Mother     Social History Social History  Substance Use Topics  . Smoking status: Never Smoker  . Smokeless tobacco: Never Used  . Alcohol use No    No Known Allergies  Current Outpatient Prescriptions  Medication Sig Dispense Refill  . amLODipine (NORVASC) 10 MG tablet TAKE 1 TABLET (10 MG TOTAL) BY MOUTH DAILY. (Patient taking differently: Take 10 mg by mouth every morning. ) 30 tablet 11  . aspirin EC 325 MG tablet Take 1 tablet (325 mg total) by mouth daily. 90 tablet 3  . atorvastatin (LIPITOR) 80 MG tablet TAKE 1 TABLET (80 MG TOTAL) BY MOUTH DAILY. (NEW CHOLESTEROL MEDICINE, STOP THE PRAVASTATIN) 30 tablet 0  . clindamycin (CLEOCIN) 300 MG capsule Take 300 mg by mouth 3 (three) times daily.    . Multiple Vitamins-Minerals (ONE-A-DAY MENS HEALTH FORMULA) TABS Take 1 tablet by mouth daily.    Marland Kitchen. ofloxacin (OCUFLOX) 0.3 % ophthalmic solution Place 1 drop into both  eyes 3 (three) times daily.    . Omega-3 Fatty Acids (FISH OIL) 1000 MG CAPS Take 1,000 mg by mouth daily.     No current facility-administered medications for this visit.     Review of Systems Review of Systems  Constitutional: Negative.   Respiratory: Negative.   Cardiovascular: Negative.     Blood pressure (!) 144/86, pulse 86, resp. rate 14, height 5' 11.5" (1.816 m), weight 221 lb (100.2 kg).  Physical Exam Physical Exam  Constitutional: He is oriented to person, place, and time. He appears well-developed and well-nourished.  Abdominal: Soft. Normal appearance and bowel sounds are normal. There is no tenderness.  Bilateral incisions clean, repairs intact.  Neurological: He is alert and oriented to person, place, and time.  Skin: Skin is warm and dry.  Psychiatric: His behavior is normal.    Data Reviewed  Progress notes  Assessment    Stable postoperative exam.    Plan    Follow up in 5 weeks. Continue  wearing his scrotal support.     This information has been scribed by Dorathy DaftMarsha Hatch RN, BSN,BC.   Natally Ribera G 07/06/2016, 11:40 AM

## 2016-07-06 NOTE — Patient Instructions (Addendum)
The patient is aware to call back for any questions or concerns. continue  wearing his scrotal support.

## 2016-07-12 ENCOUNTER — Other Ambulatory Visit: Payer: Self-pay | Admitting: Family Medicine

## 2016-07-12 MED ORDER — ATORVASTATIN CALCIUM 80 MG PO TABS: 80.0000 mg | ORAL_TABLET | Freq: Every day | ORAL | 7 refills | Status: DC

## 2016-07-14 ENCOUNTER — Other Ambulatory Visit: Payer: Self-pay | Admitting: Family Medicine

## 2016-07-14 DIAGNOSIS — I10 Essential (primary) hypertension: Secondary | ICD-10-CM

## 2016-07-14 NOTE — Telephone Encounter (Signed)
Refill request received for chlorthalidone This was stopped during last hospital stay Please find out how his BP has been doing Has been taking this? Does he need it? Or was it an automatic refill request from pharmacy? Thanks

## 2016-07-15 NOTE — Telephone Encounter (Signed)
Patient states he is not taking.

## 2016-07-19 DIAGNOSIS — N179 Acute kidney failure, unspecified: Secondary | ICD-10-CM | POA: Diagnosis not present

## 2016-07-19 DIAGNOSIS — I10 Essential (primary) hypertension: Secondary | ICD-10-CM | POA: Diagnosis not present

## 2016-07-20 ENCOUNTER — Ambulatory Visit (INDEPENDENT_AMBULATORY_CARE_PROVIDER_SITE_OTHER): Payer: Medicare HMO | Admitting: Urology

## 2016-07-20 ENCOUNTER — Ambulatory Visit (HOSPITAL_BASED_OUTPATIENT_CLINIC_OR_DEPARTMENT_OTHER): Payer: Self-pay | Admitting: Physical Medicine & Rehabilitation

## 2016-07-20 ENCOUNTER — Encounter: Payer: Self-pay | Admitting: Urology

## 2016-07-20 ENCOUNTER — Encounter: Payer: Medicare HMO | Attending: Physical Medicine & Rehabilitation

## 2016-07-20 ENCOUNTER — Encounter: Payer: Self-pay | Admitting: Physical Medicine & Rehabilitation

## 2016-07-20 VITALS — BP 160/89 | HR 76

## 2016-07-20 VITALS — BP 171/84 | HR 90 | Ht 71.0 in | Wt 216.0 lb

## 2016-07-20 DIAGNOSIS — I69351 Hemiplegia and hemiparesis following cerebral infarction affecting right dominant side: Secondary | ICD-10-CM | POA: Insufficient documentation

## 2016-07-20 DIAGNOSIS — N179 Acute kidney failure, unspecified: Secondary | ICD-10-CM | POA: Diagnosis not present

## 2016-07-20 DIAGNOSIS — M25511 Pain in right shoulder: Secondary | ICD-10-CM | POA: Insufficient documentation

## 2016-07-20 DIAGNOSIS — N281 Cyst of kidney, acquired: Secondary | ICD-10-CM | POA: Diagnosis not present

## 2016-07-20 DIAGNOSIS — Z7982 Long term (current) use of aspirin: Secondary | ICD-10-CM | POA: Diagnosis not present

## 2016-07-20 DIAGNOSIS — G8110 Spastic hemiplegia affecting unspecified side: Secondary | ICD-10-CM

## 2016-07-20 NOTE — Progress Notes (Signed)
07/20/2016 8:24 AM   Nelva Nay 1955/09/10 [id number]  Referring provider: Kerman Passey, MD [address] [place], [place] [address]  CC: Bilateral renal cysts  HPI: [name] is a 61 year old male referred from Dr. Cherylann Ratel for further evaluation of bilateral mildly complex renal cyst.  Most recently, he was admitted to [facility] with a gastrointestinal illness and acute renal failure which has presumably since resolved. He's been following up with nephrology since then. Labs drawn yesterday, but not available. Per report, patient reports that his renal function is back to normal.  As part of this workup, he underwent bilateral renal ultrasound which showed bilateral renal cysts up to 2.3 cm after mildly complex but not particularly concerning.  Also seen on CT scan, noncontrast from 04/2016. There are no previous contrasted studies for review.  Her been told about any concerning renal) in the past.  He denies any flank pain, gross hematuria, or any other significant urinary symptoms.   PMH: Past Medical History:  Diagnosis Date  . Bowel obstruction   . Chronic kidney disease   . Hyperlipidemia   . Hypertension   . Pink eye 06/21/2016  . Sepsis (HCC)   . Stroke [facility]) 12/24/2013   right side weakness    Surgical History: Past Surgical History:  Procedure Laterality Date  . INGUINAL HERNIA REPAIR Bilateral 06/25/2016   Procedure: HERNIA REPAIR INGUINAL ADULT BILATERAL;  Surgeon: Kieth Brightly, MD;  Location: [facility] ORS;  Service: General;  Laterality: Bilateral;  . KNEE ARTHROSCOPY Right 1970's    Home Medications:  Allergies as of 07/20/2016   No Known Allergies     Medication List       Accurate as of 07/20/16 11:59 PM. Always use your most recent med list.          amLODipine 10 MG tablet Commonly known as:  NORVASC TAKE 1 TABLET (10 MG TOTAL) BY MOUTH DAILY.   aspirin EC 325 MG tablet Take 1 tablet (325 mg total) by mouth daily.     atorvastatin 80 MG tablet Commonly known as:  LIPITOR Take 1 tablet (80 mg total) by mouth daily. (new cholesterol medicine, stop the pravastatin)   Fish Oil 1000 MG Caps Take 1,000 mg by mouth daily.   losartan 25 MG tablet Commonly known as:  COZAAR   ONE-A-DAY MENS HEALTH FORMULA Tabs Take 1 tablet by mouth daily.       Allergies: No Known Allergies  Family History: Family History  Problem Relation Age of Onset  . Hypertension Mother   . Prostate cancer Neg Hx   . Bladder Cancer Neg Hx   . Kidney cancer Neg Hx     Social History:  reports that he has never smoked. He has never used smokeless tobacco. He reports that he uses drugs, including Marijuana. He reports that he does not drink alcohol.  ROS: UROLOGY Frequent Urination?: No Hard to postpone urination?: No Burning/pain with urination?: No Get up at night to urinate?: Yes Leakage of urine?: No Urine stream starts and stops?: No Trouble starting stream?: No Do you have to strain to urinate?: No Blood in urine?: No Urinary tract infection?: No Sexually transmitted disease?: No Injury to kidneys or bladder?: No Painful intercourse?: No Weak stream?: No Erection problems?: No Penile pain?: No  Gastrointestinal Nausea?: No Vomiting?: No Indigestion/heartburn?: No Diarrhea?: No Constipation?: No  Constitutional Fever: No Night sweats?: No Weight loss?: No Fatigue?: No  Skin Skin rash/lesions?: No Itching?: No  Eyes  Blurred vision?: No Double vision?: No  Ears/Nose/Throat Sore throat?: No Sinus problems?: No  Hematologic/Lymphatic Swollen glands?: No Easy bruising?: No  Cardiovascular Leg swelling?: No Chest pain?: No  Respiratory Cough?: No Shortness of breath?: No  Endocrine Excessive thirst?: No  Musculoskeletal Back pain?: No Joint pain?: No  Neurological Headaches?: No Dizziness?: No  Psychologic Depression?: No Anxiety?: No  Physical Exam: BP (!) 171/84    Pulse 90   Ht 5\' 11"  (1.803 m)   Wt 216 lb (98 kg)   BMI 30.13 kg/m   Constitutional:  Alert and oriented, No acute distress. HEENT: [place] AT, moist mucus membranes.  Trachea midline, no masses. Cardiovascular: Right > left lower extremity edema.   Respiratory: Normal respiratory effort, no increased work of breathing. GI: Abdomen is soft, nontender, nondistended, no abdominal masses GU: No CVA tenderness.  Skin: No rashes, bruises or suspicious lesions. Neurologic: Grossly intact.  Right upper and lower extermity weakness, abnormal gait.   Psychiatric: Normal mood and affect.  Laboratory Data: Lab Results  Component Value Date   WBC 9.2 05/13/2016   HGB 12.0 (L) 05/13/2016   HCT 35.6 (L) 05/13/2016   MCV 84.3 05/13/2016   PLT 239 05/13/2016    Lab Results  Component Value Date   CREATININE 1.91 (H) 05/20/2016    Lab Results  Component Value Date   HGBA1C 6.0 (H) 11/18/2015    Urinalysis N/a  Pertinent Imaging: CLINICAL DATA:  Acute renal failure  EXAM: RENAL / URINARY TRACT ULTRASOUND COMPLETE  COMPARISON:  CT abdomen/ pelvis dated 05/10/2016  FINDINGS: Right Kidney:  Length: 10.6 cm. 2.0 x 2.0 x 2.2 cm mildly complex cyst in the interpolar region. 1.8 x 2.0 x 1.8 cm mildly complex cyst in the upper pole. Neither demonstrate simple fluid density, and may be hemorrhagic. No hydronephrosis.  Left Kidney:  Length: 11.4 cm. 2.0 x 1.9 x 1.5 cm simple cyst in the interpolar region. 2.2 x 2.3 x 2.0 cm complex cyst in the upper pole, not demonstrating simple fluid density, possibly hemorrhagic. No hydronephrosis.  Bladder:  Within normal limits.  IMPRESSION: No hydronephrosis.  Multiple bilateral renal cysts, some of which do demonstrate simple fluid density, possibly hemorrhagic.   Electronically Signed   By: Charline BillsSriyesh  Krishnan M.D.   On: 05/15/2016 10:55  Renal ultrasound personally reviewed today, this was compared to noncontrast CT scan  obtained from the similar timing.  Assessment & Plan:    1. Renal cyst Bilateral renal cyst, Bosniak I and II up to 2.3 cm in size although quality of ultrasound is somewhat limited. We discussed that these are likely benign, however, given the quality of the ultrasound, may be reasonable to repeat at least once. Plan for follow-up renal ultrasound in 6-12 months. - US Renal; Future  2. Acute renal failure, unspecified acute renal failure type (HCC) Acute renal failure secondary to dehydration, followed by Dr. Cherylann RatelLateef Patient reports that this is resolved, unable to review most recent labs  Return in about 9 months (around 04/19/2017) for RUS.  Vanna ScotlandAshley Oluwatoni Rotunno, MD  [facility] [address], [address] [place], [place] [address] [phone]

## 2016-07-20 NOTE — Progress Notes (Signed)
Dysport Injection for spasticity using needle EMG guidance  Dilution: 200 Units/ml Indication: Severe spasticity which interferes with ADL,mobility and/or  hygiene and is unresponsive to medication management and other conservative care Informed consent was obtained after describing risks and benefits of the procedure with the patient. This includes bleeding, bruising, infection, excessive weakness, or medication side effects. A REMS form is on file and signed. Needle:  needle electrode Number of units per muscle Biceps 300 Brachialis, 300 FCR 100 FDS 100 FDP 200 Pronator, 100 Brachial radialis, 100 Pectoralis 300 All injections were done after obtaining appropriate EMG activity and after negative drawback for blood. The patient tolerated the procedure well. Post procedure instructions were given. A followup appointment was made.

## 2016-07-20 NOTE — Patient Instructions (Signed)

## 2016-07-26 DIAGNOSIS — I10 Essential (primary) hypertension: Secondary | ICD-10-CM | POA: Diagnosis not present

## 2016-07-26 DIAGNOSIS — N179 Acute kidney failure, unspecified: Secondary | ICD-10-CM | POA: Diagnosis not present

## 2016-07-26 DIAGNOSIS — D649 Anemia, unspecified: Secondary | ICD-10-CM | POA: Diagnosis not present

## 2016-08-04 NOTE — Telephone Encounter (Signed)
Pt now back to baseline, so I think he should be able to drive

## 2016-08-05 ENCOUNTER — Encounter: Payer: Self-pay | Admitting: General Surgery

## 2016-08-05 ENCOUNTER — Ambulatory Visit (INDEPENDENT_AMBULATORY_CARE_PROVIDER_SITE_OTHER): Payer: Medicare HMO | Admitting: General Surgery

## 2016-08-05 VITALS — BP 162/78 | HR 88 | Resp 14 | Ht 71.5 in | Wt 218.0 lb

## 2016-08-05 DIAGNOSIS — K4001 Bilateral inguinal hernia, with obstruction, without gangrene, recurrent: Secondary | ICD-10-CM

## 2016-08-05 NOTE — Patient Instructions (Signed)
Return as needed

## 2016-08-05 NOTE — Progress Notes (Signed)
Patient ID: [name], male   DOB: 03/29/1956, 61 y.o.   MRN: [id number]  Chief Complaint  Patient presents with  . Routine Post Op    HPI [name] is a 61 y.o. male here today for a postoperative visit, bilateral inguinal hernia repair on 06-25-16. He states he is doing well. Denies any gastrointestinal issues, bowels are moving regularly. I have reviewed the history of present illness with the patient.    HPI  Past Medical History:  Diagnosis Date  . Bowel obstruction   . Chronic kidney disease   . Hyperlipidemia   . Hypertension   . Pink eye 06/21/2016  . Sepsis (HCC)   . Stroke [facility](HCC) 12/24/2013   right side weakness    Past Surgical History:  Procedure Laterality Date  . INGUINAL HERNIA REPAIR Bilateral 06/25/2016   Procedure: HERNIA REPAIR INGUINAL ADULT BILATERAL;  Surgeon: Kieth BrightlySeeplaputhur G Sankar, MD;  Location: [facility] ORS;  Service: General;  Laterality: Bilateral;  . KNEE ARTHROSCOPY Right 1970's    Family History  Problem Relation Age of Onset  . Hypertension Mother   . Prostate cancer Neg Hx   . Bladder Cancer Neg Hx   . Kidney cancer Neg Hx     Social History Social History  Substance Use Topics  . Smoking status: Never Smoker  . Smokeless tobacco: Never Used  . Alcohol use No    No Known Allergies  Current Outpatient Prescriptions  Medication Sig Dispense Refill  . amLODipine (NORVASC) 10 MG tablet TAKE 1 TABLET (10 MG TOTAL) BY MOUTH DAILY. (Patient taking differently: Take 10 mg by mouth every morning. ) 30 tablet 11  . aspirin EC 325 MG tablet Take 1 tablet (325 mg total) by mouth daily. 90 tablet 3  . atorvastatin (LIPITOR) 80 MG tablet Take 1 tablet (80 mg total) by mouth daily. (new cholesterol medicine, stop the pravastatin) 30 tablet 7  . losartan (COZAAR) 25 MG tablet Take 25 mg by mouth daily.     . Multiple Vitamins-Minerals (ONE-A-DAY MENS HEALTH FORMULA) TABS Take 1 tablet by mouth daily.    . Omega-3 Fatty Acids (FISH OIL) 1000 MG  CAPS Take 1,000 mg by mouth daily.     No current facility-administered medications for this visit.     Review of Systems Review of Systems  Constitutional: Negative.   Respiratory: Negative.   Cardiovascular: Negative.     Blood pressure (!) 162/78, pulse 88, resp. rate 14, height 5' 11.5" (1.816 m), weight 218 lb (98.9 kg).  Physical Exam Physical Exam  Constitutional: He is oriented to person, place, and time. He appears well-developed and well-nourished.  Pulmonary/Chest: Effort normal.  Abdominal: Soft. He exhibits no distension and no mass. There is no tenderness. Hernia confirmed negative in the right inguinal area and confirmed negative in the left inguinal area.  Neurological: He is alert and oriented to person, place, and time.  Skin: Skin is warm and dry.  Both inguinal incisions are well healed, repairs intact  Data Reviewed Prior notes  Assessment    Stable postoperative exam.    Plan    Patient to return as needed.     This information has been scribed by Dorathy DaftMarsha Hatch RN, BSN,BC.   SANKAR,SEEPLAPUTHUR G 08/05/2016, 12:35 PM

## 2016-08-31 ENCOUNTER — Ambulatory Visit: Payer: Medicare HMO | Admitting: Physical Medicine & Rehabilitation

## 2016-08-31 ENCOUNTER — Encounter: Payer: Medicare HMO | Attending: Physical Medicine & Rehabilitation

## 2016-08-31 DIAGNOSIS — M25511 Pain in right shoulder: Secondary | ICD-10-CM | POA: Insufficient documentation

## 2016-08-31 DIAGNOSIS — I69351 Hemiplegia and hemiparesis following cerebral infarction affecting right dominant side: Secondary | ICD-10-CM | POA: Insufficient documentation

## 2016-08-31 DIAGNOSIS — Z7982 Long term (current) use of aspirin: Secondary | ICD-10-CM | POA: Insufficient documentation

## 2016-09-09 ENCOUNTER — Encounter: Payer: Self-pay | Admitting: Physical Medicine & Rehabilitation

## 2016-10-04 ENCOUNTER — Ambulatory Visit: Payer: Medicare HMO | Admitting: Physical Medicine & Rehabilitation

## 2016-10-04 ENCOUNTER — Encounter: Payer: Medicare HMO | Attending: Physical Medicine & Rehabilitation

## 2016-10-04 DIAGNOSIS — Z7982 Long term (current) use of aspirin: Secondary | ICD-10-CM | POA: Insufficient documentation

## 2016-10-04 DIAGNOSIS — M25511 Pain in right shoulder: Secondary | ICD-10-CM | POA: Insufficient documentation

## 2016-10-04 DIAGNOSIS — I69351 Hemiplegia and hemiparesis following cerebral infarction affecting right dominant side: Secondary | ICD-10-CM | POA: Insufficient documentation

## 2016-10-04 DIAGNOSIS — I63512 Cerebral infarction due to unspecified occlusion or stenosis of left middle cerebral artery: Secondary | ICD-10-CM

## 2016-10-04 NOTE — Progress Notes (Signed)
Discussed with patient that it is too early to repeat the Botox. We will reschedule for early June

## 2016-10-25 ENCOUNTER — Encounter: Payer: Medicare HMO | Attending: Physical Medicine & Rehabilitation

## 2016-10-25 ENCOUNTER — Encounter: Payer: Self-pay | Admitting: Physical Medicine & Rehabilitation

## 2016-10-25 ENCOUNTER — Ambulatory Visit (HOSPITAL_BASED_OUTPATIENT_CLINIC_OR_DEPARTMENT_OTHER): Payer: Medicare HMO | Admitting: Physical Medicine & Rehabilitation

## 2016-10-25 VITALS — BP 159/90 | HR 82

## 2016-10-25 DIAGNOSIS — Z7982 Long term (current) use of aspirin: Secondary | ICD-10-CM | POA: Insufficient documentation

## 2016-10-25 DIAGNOSIS — I69351 Hemiplegia and hemiparesis following cerebral infarction affecting right dominant side: Secondary | ICD-10-CM | POA: Diagnosis not present

## 2016-10-25 DIAGNOSIS — M25511 Pain in right shoulder: Secondary | ICD-10-CM | POA: Diagnosis not present

## 2016-10-25 DIAGNOSIS — G8110 Spastic hemiplegia affecting unspecified side: Secondary | ICD-10-CM | POA: Diagnosis not present

## 2016-10-25 NOTE — Patient Instructions (Signed)

## 2016-10-25 NOTE — Progress Notes (Signed)
Dysport Injection for spasticity using needle EMG guidance  Dilution: 200 Units/ml Indication: Severe spasticity which interferes with ADL,mobility and/or  hygiene and is unresponsive to medication management and other conservative care Informed consent was obtained after describing risks and benefits of the procedure with the patient. This includes bleeding, bruising, infection, excessive weakness, or medication side effects. A REMS form is on file and signed. Needle:  needle electrode Number of units per muscle Biceps 300 Brachialis, 300 FCR 100 FDS 100 FDP 200 Pronator, 100 Brachial radialis, 100 Pectoralis 300 All injections were done after obtaining appropriate EMG activity and after negative drawback for blood. The patient tolerated the procedure well. Post procedure instructions were given. A followup appointment was made.

## 2016-10-27 ENCOUNTER — Encounter: Payer: Self-pay | Admitting: Family Medicine

## 2016-10-27 DIAGNOSIS — N182 Chronic kidney disease, stage 2 (mild): Secondary | ICD-10-CM

## 2016-10-28 MED ORDER — LOSARTAN POTASSIUM 50 MG PO TABS: 50.0000 mg | ORAL_TABLET | Freq: Every day | ORAL | 0 refills | Status: DC

## 2016-11-11 ENCOUNTER — Encounter: Payer: Self-pay | Admitting: Family Medicine

## 2016-11-11 ENCOUNTER — Ambulatory Visit (INDEPENDENT_AMBULATORY_CARE_PROVIDER_SITE_OTHER): Payer: Medicare HMO | Admitting: Family Medicine

## 2016-11-11 DIAGNOSIS — G8110 Spastic hemiplegia affecting unspecified side: Secondary | ICD-10-CM

## 2016-11-11 DIAGNOSIS — E785 Hyperlipidemia, unspecified: Secondary | ICD-10-CM

## 2016-11-11 DIAGNOSIS — I6523 Occlusion and stenosis of bilateral carotid arteries: Secondary | ICD-10-CM | POA: Diagnosis not present

## 2016-11-11 DIAGNOSIS — Z5181 Encounter for therapeutic drug level monitoring: Secondary | ICD-10-CM

## 2016-11-11 DIAGNOSIS — I700 Atherosclerosis of aorta: Secondary | ICD-10-CM

## 2016-11-11 DIAGNOSIS — R7303 Prediabetes: Secondary | ICD-10-CM | POA: Diagnosis not present

## 2016-11-11 DIAGNOSIS — N182 Chronic kidney disease, stage 2 (mild): Secondary | ICD-10-CM

## 2016-11-11 LAB — CBC WITH DIFFERENTIAL/PLATELET
BASOS PCT: 1 %
Basophils Absolute: 44 cells/uL (ref 0–200)
EOS ABS: 132 {cells}/uL (ref 15–500)
Eosinophils Relative: 3 %
HEMATOCRIT: 42.4 % (ref 38.5–50.0)
Hemoglobin: 13.8 g/dL (ref 13.2–17.1)
Lymphocytes Relative: 34 %
Lymphs Abs: 1496 cells/uL (ref 850–3900)
MCH: 27.1 pg (ref 27.0–33.0)
MCHC: 32.5 g/dL (ref 32.0–36.0)
MCV: 83.3 fL (ref 80.0–100.0)
MONO ABS: 352 {cells}/uL (ref 200–950)
MPV: 10 fL (ref 7.5–12.5)
Monocytes Relative: 8 %
NEUTROS ABS: 2376 {cells}/uL (ref 1500–7800)
Neutrophils Relative %: 54 %
PLATELETS: 272 10*3/uL (ref 140–400)
RBC: 5.09 MIL/uL (ref 4.20–5.80)
RDW: 15.8 % — AB (ref 11.0–15.0)
WBC: 4.4 10*3/uL (ref 3.8–10.8)

## 2016-11-11 NOTE — Assessment & Plan Note (Signed)
Working with therapist and having Botox

## 2016-11-11 NOTE — Patient Instructions (Signed)
We'll get labs today Try to limit saturated fats in your diet (bologna, hot dogs, barbeque, cheeseburgers, hamburgers, steak, bacon, sausage, cheese, etc.) and get more fresh fruits, vegetables, and whole grains Try to follow the DASH guidelines (DASH stands for Dietary Approaches to Stop Hypertension) Try to limit the sodium in your diet.  Ideally, consume less than 1.5 grams (less than 1,500mg ) per day. Do not add salt when cooking or at the table.  Check the sodium amount on labels when shopping, and choose items lower in sodium when given a choice. Avoid or limit foods that already contain a lot of sodium. Eat a diet rich in fruits and vegetables and whole grains.

## 2016-11-11 NOTE — Progress Notes (Signed)
BP 138/86   Pulse 85   Temp 98.1 F (36.7 C) (Oral)   Resp 14   Wt 215 lb 4.8 oz (97.7 kg)   SpO2 97%   BMI 29.61 kg/m    Subjective:    Patient ID: [name], male    DOB: 07/30/1955, 61 y.o.   MRN: [id number]  HPI: [name] is a 61 y.o. male  Chief Complaint  Patient presents with  . Follow-up    6 month   HPI Patient is here for 6 month f/u He had the hernia surgery; no complications; doing well; BMs normal; released by surgeon  Hypertension; losartan was just increased; no problems He did not have the labs rechecked and we will do today Does not use salt or salt substitutes No decongestants On CCB; no swelling in ankles, legs  CKD; sees kidney doctor and knows to just take tylenol, no BC powders, none of that, no Aleve  High cholesterol; on statin plus fish oil; avoiding fatty meats  Hx of stroke; quit eating the fatty meats after his stroke; still has residual weakness on the right side; just got Botox 3-4 weeks ago; more movement  Prediabetes; no first degree family members with diabetes  Depression screen [facility] 11/11/2016 10/25/2016 05/20/2016 04/19/2016 01/12/2016  Decreased Interest 0 0 0 0 0  Down, Depressed, Hopeless 0 0 0 0 0  PHQ - 2 Score 0 0 0 0 0  Altered sleeping - - - - -  Tired, decreased energy - - - - -  Change in appetite - - - - -  Feeling bad or failure about yourself  - - - - -  Trouble concentrating - - - - -  Moving slowly or fidgety/restless - - - - -  Suicidal thoughts - - - - -  PHQ-9 Score - - - - -    Relevant past medical, surgical, family and social history reviewed Past Medical History:  Diagnosis Date  . Bowel obstruction (HCC)   . Chronic kidney disease   . Hyperlipidemia   . Hypertension   . Inguinal hernia   . Pink eye 06/21/2016  . Sepsis (HCC)   . Stroke [facility](HCC) 12/24/2013   right side weakness   Past Surgical History:  Procedure Laterality Date  . INGUINAL HERNIA REPAIR Bilateral 06/25/2016   Procedure: HERNIA REPAIR INGUINAL ADULT BILATERAL;  Surgeon: Kieth BrightlySeeplaputhur G Sankar, MD;  Location: [facility] ORS;  Service: General;  Laterality: Bilateral;  . KNEE ARTHROSCOPY Right 1970's   Family History  Problem Relation Age of Onset  . Hypertension Mother   . Hernia Sister   . Prostate cancer Neg Hx   . Bladder Cancer Neg Hx   . Kidney cancer Neg Hx    Social History   Social History  . Marital status: Married    Spouse name: N/A  . Number of children: N/A  . Years of education: N/A   Occupational History  . Not on file.   Social History Main Topics  . Smoking status: Never Smoker  . Smokeless tobacco: Never Used  . Alcohol use No  . Drug use: Yes    Types: Marijuana     Comment: > 20 YEARS AGO  . Sexual activity: No   Other Topics Concern  . Not on file   Social History Narrative  . No narrative on file    Interim medical history since last visit reviewed. Allergies and medications reviewed  Review of Systems Per HPI unless  specifically indicated above     Objective:    BP 138/86   Pulse 85   Temp 98.1 F (36.7 C) (Oral)   Resp 14   Wt 215 lb 4.8 oz (97.7 kg)   SpO2 97%   BMI 29.61 kg/m   Wt Readings from Last 3 Encounters:  11/11/16 215 lb 4.8 oz (97.7 kg)  08/05/16 218 lb (98.9 kg)  07/20/16 216 lb (98 kg)    Physical Exam  Constitutional: He appears well-developed and well-nourished. No distress.  HENT:  Head: Normocephalic and atraumatic.  Eyes: EOM are normal. No scleral icterus.  Neck: No thyromegaly present.  Cardiovascular: Normal rate and regular rhythm.   Pulmonary/Chest: Effort normal and breath sounds normal.  Abdominal: Soft. Bowel sounds are normal. He exhibits no distension.  Musculoskeletal: He exhibits no edema.  Neurological: He displays no tremor. Coordination normal.  Right UE weakness, slight contracture  Skin: Skin is warm and dry. No pallor.  Psychiatric: He has a normal mood and affect. His behavior is normal. Judgment  and thought content normal. His mood appears not anxious. He does not exhibit a depressed mood.       Assessment & Plan:   Problem List Items Addressed This Visit      Cardiovascular and Mediastinum   Atherosclerosis of both carotid arteries    Patient did not have the carotid scan last year, re-order today; continue aspirin and statin      Relevant Orders   US Carotid Bilateral   Abdominal aortic atherosclerosis (HCC)    Continue statin and aspirin        Nervous and Auditory   Spastic hemiplegia affecting dominant side (HCC)    Working with therapist and having Botox        Other   Pre-diabetes    Avoid sugars, sweets; check glucose and A1c      Relevant Orders   Hemoglobin A1c (Completed)   Medication monitoring encounter    Check sgpt, Cr and K+      Relevant Orders   CBC with Differential/Platelet (Completed)   ALT (Completed)   Hyperlipidemia LDL goal <70    Check fasting labs, had breakfast, skipped lunch so readings should be accurate      Relevant Orders   Lipid panel (Completed)    Other Visit Diagnoses    CKD (chronic kidney disease), stage II           Follow up plan: Return in about 6 months (around 05/13/2017) for twenty minute follow-up with fasting labs.  An after-visit summary was printed and given to the patient at check-out.  Please see the patient instructions which may contain other information and recommendations beyond what is mentioned above in the assessment and plan.  No orders of the defined types were placed in this encounter.   Orders Placed This Encounter  Procedures  . US Carotid Bilateral  . CBC with Differential/Platelet  . ALT  . Lipid panel  . Hemoglobin A1c

## 2016-11-11 NOTE — Assessment & Plan Note (Signed)
Check fasting labs, had breakfast, skipped lunch so readings should be accurate

## 2016-11-11 NOTE — Assessment & Plan Note (Signed)
Check sgpt, Cr and K+

## 2016-11-11 NOTE — Assessment & Plan Note (Signed)
Avoid sugars, sweets; check glucose and A1c

## 2016-11-11 NOTE — Assessment & Plan Note (Signed)
Patient did not have the carotid scan last year, re-order today; continue aspirin and statin

## 2016-11-11 NOTE — Assessment & Plan Note (Signed)
Management per specialist. 

## 2016-11-12 LAB — HEMOGLOBIN A1C
HEMOGLOBIN A1C: 6 % — AB (ref <5.7)
MEAN PLASMA GLUCOSE: 126 mg/dL

## 2016-11-12 LAB — LIPID PANEL
CHOL/HDL RATIO: 3.1 ratio (ref <5.0)
Cholesterol: 168 mg/dL (ref <200)
HDL: 55 mg/dL (ref >40)
LDL CALC: 98 mg/dL (ref <100)
Triglycerides: 75 mg/dL (ref <150)
VLDL: 15 mg/dL (ref <30)

## 2016-11-12 LAB — BASIC METABOLIC PANEL
BUN: 13 mg/dL (ref 7–25)
CHLORIDE: 103 mmol/L (ref 98–110)
CO2: 26 mmol/L (ref 20–31)
Calcium: 9.5 mg/dL (ref 8.6–10.3)
Creat: 1.04 mg/dL (ref 0.70–1.25)
GLUCOSE: 87 mg/dL (ref 65–99)
POTASSIUM: 4.2 mmol/L (ref 3.5–5.3)
SODIUM: 139 mmol/L (ref 135–146)

## 2016-11-12 LAB — ALT: ALT: 29 U/L (ref 9–46)

## 2016-11-18 ENCOUNTER — Ambulatory Visit: Payer: BLUE CROSS/BLUE SHIELD | Admitting: Family Medicine

## 2016-11-25 ENCOUNTER — Other Ambulatory Visit: Payer: Self-pay | Admitting: Family Medicine

## 2016-11-25 ENCOUNTER — Ambulatory Visit
Admission: RE | Admit: 2016-11-25 | Discharge: 2016-11-25 | Disposition: A | Discharge status: Home / Self Care | Payer: Medicare HMO | Source: Ambulatory Visit | Attending: Family Medicine | Admitting: Family Medicine

## 2016-11-25 DIAGNOSIS — I6523 Occlusion and stenosis of bilateral carotid arteries: Secondary | ICD-10-CM | POA: Insufficient documentation

## 2016-11-25 NOTE — Telephone Encounter (Signed)
Last Cr and K+ reviewed; Rx approved 

## 2016-12-01 ENCOUNTER — Other Ambulatory Visit: Payer: Self-pay | Admitting: Family Medicine

## 2016-12-01 ENCOUNTER — Encounter: Payer: Self-pay | Admitting: Family Medicine

## 2016-12-01 DIAGNOSIS — Z5181 Encounter for therapeutic drug level monitoring: Secondary | ICD-10-CM

## 2016-12-01 DIAGNOSIS — I700 Atherosclerosis of aorta: Secondary | ICD-10-CM

## 2016-12-01 DIAGNOSIS — I6523 Occlusion and stenosis of bilateral carotid arteries: Secondary | ICD-10-CM

## 2016-12-01 MED ORDER — ROSUVASTATIN CALCIUM 40 MG PO TABS: 40.0000 mg | ORAL_TABLET | Freq: Every day | ORAL | 1 refills | Status: DC

## 2016-12-01 NOTE — Progress Notes (Signed)
Note through MyChart Refer to vasc Stop atorvastatin Start rosuvastatin Check fasting labs in 6 weeks

## 2016-12-02 ENCOUNTER — Telehealth: Payer: Self-pay | Admitting: Family Medicine

## 2016-12-02 NOTE — Telephone Encounter (Signed)
I spoke with patient; explained nothing urgent; just want to get his foot in the door We need to get his LDL under 70, that's why the cholesterol change; he's trying to eat better

## 2016-12-03 ENCOUNTER — Encounter (INDEPENDENT_AMBULATORY_CARE_PROVIDER_SITE_OTHER): Payer: Self-pay | Admitting: Vascular Surgery

## 2016-12-03 ENCOUNTER — Ambulatory Visit (INDEPENDENT_AMBULATORY_CARE_PROVIDER_SITE_OTHER): Payer: Medicare HMO | Admitting: Vascular Surgery

## 2016-12-03 VITALS — BP 151/79 | HR 77 | Resp 15 | Ht 71.0 in | Wt 218.0 lb

## 2016-12-03 DIAGNOSIS — E785 Hyperlipidemia, unspecified: Secondary | ICD-10-CM | POA: Diagnosis not present

## 2016-12-03 DIAGNOSIS — I10 Essential (primary) hypertension: Secondary | ICD-10-CM | POA: Diagnosis not present

## 2016-12-03 DIAGNOSIS — I700 Atherosclerosis of aorta: Secondary | ICD-10-CM

## 2016-12-03 DIAGNOSIS — I6523 Occlusion and stenosis of bilateral carotid arteries: Secondary | ICD-10-CM | POA: Diagnosis not present

## 2016-12-03 NOTE — Assessment & Plan Note (Signed)
Going back about 3 years, he has had some carotid atherosclerosis without hemodynamically significant stenosis. At this point, I think we can stretch out his follow-ups and checked this every other year. He should continue his aspirin and statin agent.

## 2016-12-03 NOTE — Assessment & Plan Note (Signed)
This was seen on a previous CT scan last year. I think it would be appropriate to follow up with an aortoiliac duplex as well as ABIs to evaluate his lower extremity arterial perfusion. This can be done at his convenience. Continue aspirin and statin agent.

## 2016-12-03 NOTE — Progress Notes (Signed)
Patient ID: [name], male   DOB: Jul 06, 1955, 61 y.o.   MRN: [id number]  Chief Complaint  Patient presents with  . New Evaluation    Carotid/aortic atherosclerosis    HPI [name] is a 61 y.o. male.  I am asked to see the patient by Dr. Sanda Klein for evaluation of multiple vascular issues.  The patient reports Previous history of stroke a few years ago as well as a recent surgery for a bowel obstruction. He had a recent carotid duplex which I have reviewed which shows carotid atherosclerosis without hemodynamically significant stenosis currently. This was first diagnosed a year or 2 ago after his stroke. He has also been diagnosed with aortic atherosclerosis on imaging studies performed for other reasons. He denies any recent stroke or TIA symptoms. He has chronic right arm weakness after his previous stroke. He has no current speech or swallowing issues or visual symptoms. He denies lower extremity lifestyle limiting claudication, ischemic rest pain, or ulceration. He is in his usual state of health today. He reports he and his wife have been trying to eat more healthy and he is trying to get more exercise.   Past Medical History:  Diagnosis Date  . Bowel obstruction ([place])   . Chronic kidney disease   . Hyperlipidemia   . Hypertension   . Inguinal hernia   . Pink eye 06/21/2016  . Sepsis ([place])   . Stroke [facility]) 12/24/2013   right side weakness    Past Surgical History:  Procedure Laterality Date  . INGUINAL HERNIA REPAIR Bilateral 06/25/2016   Procedure: HERNIA REPAIR INGUINAL ADULT BILATERAL;  Surgeon: Christene Lye, MD;  Location: [facility] ORS;  Service: General;  Laterality: Bilateral;  . KNEE ARTHROSCOPY Right 1970's    Family History  Problem Relation Age of Onset  . Hypertension Mother   . Hernia Sister   . Prostate cancer Neg Hx   . Bladder Cancer Neg Hx   . Kidney cancer Neg Hx   No bleeding or clotting disorders  Social History Social History    Substance Use Topics  . Smoking status: Never Smoker  . Smokeless tobacco: Never Used  . Alcohol use No  Married  No Known Allergies  Current Outpatient Prescriptions  Medication Sig Dispense Refill  . amLODipine (NORVASC) 10 MG tablet TAKE 1 TABLET (10 MG TOTAL) BY MOUTH DAILY. (Patient taking differently: Take 10 mg by mouth every morning. ) 30 tablet 11  . aspirin EC 325 MG tablet Take 1 tablet (325 mg total) by mouth daily. 90 tablet 3  . atorvastatin (LIPITOR) 80 MG tablet TAKE 1 TABLET (80 MG TOTAL) BY MOUTH DAILY. (NEW CHOLESTEROL MEDICINE, STOP THE PRAVASTATIN)  7  . losartan (COZAAR) 50 MG tablet TAKE 1 TABLET BY MOUTH EVERY DAY 30 tablet 0  . Multiple Vitamins-Minerals (ONE-A-DAY MENS HEALTH FORMULA) TABS Take 1 tablet by mouth daily.    . Omega-3 Fatty Acids (FISH OIL) 1000 MG CAPS Take 1,000 mg by mouth daily.    . rosuvastatin (CRESTOR) 40 MG tablet Take 1 tablet (40 mg total) by mouth daily. For cholesterol (this replaces atorvastatin) 30 tablet 1   No current facility-administered medications for this visit.       REVIEW OF SYSTEMS (Negative unless checked)  Constitutional: '[]'$ Weight loss  '[]'$ Fever  '[]'$ Chills Cardiac: '[]'$ Chest pain   '[]'$ Chest pressure   '[]'$ Palpitations   '[]'$ Shortness of breath when laying flat   '[]'$ Shortness of breath at rest   '[]'$ Shortness of breath with  exertion. Vascular:  '[]'$ Pain in legs with walking   '[]'$ Pain in legs at rest   '[]'$ Pain in legs when laying flat   '[]'$ Claudication   '[]'$ Pain in feet when walking  '[]'$ Pain in feet at rest  '[]'$ Pain in feet when laying flat   '[]'$ History of DVT   '[]'$ Phlebitis   '[x]'$ Swelling in legs   '[]'$ Varicose veins   '[]'$ Non-healing ulcers Pulmonary:   '[]'$ Uses home oxygen   '[]'$ Productive cough   '[]'$ Hemoptysis   '[]'$ Wheeze  '[]'$ COPD   '[]'$ Asthma Neurologic:  '[]'$ Dizziness  '[]'$ Blackouts   '[]'$ Seizures   '[x]'$ History of stroke   '[]'$ History of TIA  '[]'$ Aphasia   '[]'$ Temporary blindness   '[]'$ Dysphagia   '[x]'$ Weakness or numbness in arms   '[]'$ Weakness or numbness in  legs Musculoskeletal:  '[]'$ Arthritis   '[]'$ Joint swelling   '[]'$ Joint pain   '[]'$ Low back pain Hematologic:  '[]'$ Easy bruising  '[]'$ Easy bleeding   '[]'$ Hypercoagulable state   '[]'$ Anemic  '[]'$ Hepatitis Gastrointestinal:  '[]'$ Blood in stool   '[]'$ Vomiting blood  '[]'$ Gastroesophageal reflux/heartburn   '[]'$ Abdominal pain Genitourinary:  '[]'$ Chronic kidney disease   '[]'$ Difficult urination  '[]'$ Frequent urination  '[]'$ Burning with urination   '[]'$ Hematuria Skin:  '[]'$ Rashes   '[]'$ Ulcers   '[]'$ Wounds Psychological:  '[]'$ History of anxiety   '[]'$  History of major depression.    Physical Exam BP (!) 151/79 (BP Location: Left Arm)   Pulse 77   Resp 15   Ht '5\' 11"'$  (1.803 m)   Wt 218 lb (98.9 kg)   BMI 30.40 kg/m  Gen:  WD/WN, NAD Head: [place]/AT, No temporalis wasting. Ear/Nose/Throat: Hearing grossly intact, nares w/o erythema or drainage, oropharynx w/o Erythema/Exudate Eyes: Conjunctiva clear, sclera non-icteric  Neck: trachea midline.  No bruit or JVD.  Pulmonary:  Good air movement, clear to auscultation bilaterally.  Cardiac: RRR, normal S1, S2, no Murmurs, rubs or gallops. Vascular:  Vessel Right Left  Radial Palpable Palpable  Ulnar Palpable Palpable  Brachial Palpable Palpable  Carotid Palpable, without bruit Palpable, without bruit  Aorta Not palpable N/A  Femoral Palpable Palpable  Popliteal 1+ Palpable Palpable  PT Palpable Palpable  DP 1+ Palpable 1+ Palpable   Gastrointestinal: soft, non-tender/non-distended. Musculoskeletal:  Extremities without ischemic changes.  Right arm profoundly weak with some atrophy. Neurologic: Sensation grossly intact in extremities. Right arm weakness is significant. Speech is fluent. Motor exam as listed above. Psychiatric: Judgment intact, Mood & affect appropriate for pt's clinical situation. Dermatologic: No rashes or ulcers noted.  No cellulitis or open wounds.    Radiology US Carotid Bilateral  Result Date: 11/25/2016 CLINICAL DATA:  Carotid atherosclerosis. History of  hypertension and hyperlipidemia. EXAM: BILATERAL CAROTID DUPLEX ULTRASOUND TECHNIQUE: Pearline Cables scale imaging, color Doppler and duplex ultrasound were performed of bilateral carotid and vertebral arteries in the neck. COMPARISON:  12/24/2013 FINDINGS: Criteria: Quantification of carotid stenosis is based on velocity parameters that correlate the residual internal carotid diameter with NASCET-based stenosis levels, using the diameter of the distal internal carotid lumen as the denominator for stenosis measurement. The following velocity measurements were obtained: RIGHT ICA:  98/33 cm/sec CCA:  102/72 cm/sec SYSTOLIC ICA/CCA RATIO:  0.9 DIASTOLIC ICA/CCA RATIO:  2.1 ECA:  92 cm/sec LEFT ICA:  62/13 cm/sec CCA:  53/66 cm/sec SYSTOLIC ICA/CCA RATIO:  0.7 DIASTOLIC ICA/CCA RATIO:  1.1 ECA:  100 cm/sec RIGHT CAROTID ARTERY: There is a moderate amount of eccentric mixed echogenic plaque within the right carotid bulb (images 15 and 16), extending to involve the origin and proximal aspects of the right internal carotid artery (image  23), morphologically similar to slightly progressed compared to the 12/2013 examination though again not resulting in elevated peak systolic velocities within the interrogated course the right internal carotid artery to suggest a hemodynamically significant stenosis. RIGHT VERTEBRAL ARTERY:  Antegrade flow LEFT CAROTID ARTERY: There is a minimal amount of eccentric mixed echogenic plaque involving the origin and proximal aspect the left internal carotid artery (image 55), unchanged to slightly progressed compared to the 12/2013 examination though again not resulting in elevated peak systolic velocities within the interrogated course the left internal carotid artery to suggest a hemodynamically significant stenosis. LEFT VERTEBRAL ARTERY:  Antegrade flow IMPRESSION: No change to slight progression of bilateral atherosclerotic plaque, though again not resulting in a hemodynamically significant  stenosis within either internal carotid artery. Electronically Signed   By: Sandi Mariscal M.D.   On: 11/25/2016 14:17    Labs Recent Results (from the past 2160 hour(s))  Basic Metabolic Panel (BMET)     Status: None   Collection Time: 11/11/16  2:43 PM  Result Value Ref Range   Sodium 139 135 - 146 mmol/L   Potassium 4.2 3.5 - 5.3 mmol/L   Chloride 103 98 - 110 mmol/L   CO2 26 20 - 31 mmol/L   Glucose, Bld 87 65 - 99 mg/dL   BUN 13 7 - 25 mg/dL   Creat 1.04 0.70 - 1.25 mg/dL    Comment:   For patients > or = 62 years of age: The upper reference limit for Creatinine is approximately 13% higher for people identified as African-American.      Calcium 9.5 8.6 - 10.3 mg/dL  CBC with Differential/Platelet     Status: Abnormal   Collection Time: 11/11/16  2:53 PM  Result Value Ref Range   WBC 4.4 3.8 - 10.8 K/uL   RBC 5.09 4.20 - 5.80 MIL/uL   Hemoglobin 13.8 13.2 - 17.1 g/dL   HCT 42.4 38.5 - 50.0 %   MCV 83.3 80.0 - 100.0 fL   MCH 27.1 27.0 - 33.0 pg   MCHC 32.5 32.0 - 36.0 g/dL   RDW 15.8 (H) 11.0 - 15.0 %   Platelets 272 140 - 400 K/uL   MPV 10.0 7.5 - 12.5 fL   Neutro Abs 2,376 1,500 - 7,800 cells/uL   Lymphs Abs 1,496 850 - 3,900 cells/uL   Monocytes Absolute 352 200 - 950 cells/uL   Eosinophils Absolute 132 15 - 500 cells/uL   Basophils Absolute 44 0 - 200 cells/uL   Neutrophils Relative % 54 %   Lymphocytes Relative 34 %   Monocytes Relative 8 %   Eosinophils Relative 3 %   Basophils Relative 1 %   Smear Review Criteria for review not met   ALT     Status: None   Collection Time: 11/11/16  2:53 PM  Result Value Ref Range   ALT 29 9 - 46 U/L  Lipid panel     Status: None   Collection Time: 11/11/16  2:53 PM  Result Value Ref Range   Cholesterol 168 <200 mg/dL   Triglycerides 75 <150 mg/dL   HDL 55 >40 mg/dL   Total CHOL/HDL Ratio 3.1 <5.0 Ratio   VLDL 15 <30 mg/dL   LDL Cholesterol 98 <100 mg/dL  Hemoglobin A1c     Status: Abnormal   Collection Time:  11/11/16  2:53 PM  Result Value Ref Range   Hgb A1c MFr Bld 6.0 (H) <5.7 %    Comment:   For someone without  known diabetes, a hemoglobin A1c value between 5.7% and 6.4% is consistent with prediabetes and should be confirmed with a follow-up test.   For someone with known diabetes, a value <7% indicates that their diabetes is well controlled. A1c targets should be individualized based on duration of diabetes, age, co-morbid conditions and other considerations.   This assay result is consistent with an increased risk of diabetes.   Currently, no consensus exists regarding use of hemoglobin A1c for diagnosis of diabetes in children.      Mean Plasma Glucose 126 mg/dL    Assessment/Plan:  Hypertension goal BP (blood pressure) < 140/90 blood pressure control important in reducing the progression of atherosclerotic disease. On appropriate oral medications.   Atherosclerosis of both carotid arteries Going back about 3 years, he has had some carotid atherosclerosis without hemodynamically significant stenosis. At this point, I think we can stretch out his follow-ups and checked this every other year. He should continue his aspirin and statin agent.  Hyperlipidemia LDL goal <70 lipid control important in reducing the progression of atherosclerotic disease. Continue statin therapy   Abdominal aortic atherosclerosis ([place]) This was seen on a previous CT scan last year. I think it would be appropriate to follow up with an aortoiliac duplex as well as ABIs to evaluate his lower extremity arterial perfusion. This can be done at his convenience. Continue aspirin and statin agent.      Leotis Pain 12/03/2016, 11:20 AM   This note was created with Dragon medical transcription system.  Any errors from dictation are unintentional.

## 2016-12-03 NOTE — Assessment & Plan Note (Signed)
blood pressure control important in reducing the progression of atherosclerotic disease. On appropriate oral medications.  

## 2016-12-03 NOTE — Assessment & Plan Note (Signed)
lipid control important in reducing the progression of atherosclerotic disease. Continue statin therapy  

## 2016-12-03 NOTE — Patient Instructions (Signed)
Carotid Artery Disease The carotid arteries are arteries on both sides of the neck. They carry blood to the brain. Carotid artery disease is when the arteries get smaller (narrow) or get blocked. If these arteries get smaller or get blocked, you are more likely to have a stroke or warning stroke (transient ischemic attack). Follow these instructions at home:  Take medicines as told by your doctor. Make sure you understand all your medicine instructions. Do not stop your medicines without talking to your doctor first.  Follow your doctor's diet instructions. It is important to eat a healthy diet that includes plenty of: ? Fresh fruits. ? Vegetables. ? Lean meats.  Avoid: ? High-fat foods. ? High-sodium foods. ? Foods that are fried, overly processed, or have poor nutritional value.  Stay a healthy weight.  Stay active. Get at least 30 minutes of activity every day.  Do not smoke.  Limit alcohol use to: ? No more than 2 drinks a day for men. ? No more than 1 drink a day for women who are not pregnant.  Do not use illegal drugs.  Keep all doctor visits as told. Get help right away if:  You have sudden weakness or loss of feeling (numbness) on one side of the body, such as the face, arm, or leg.  You have sudden confusion.  You have trouble speaking (aphasia) or understanding.  You have sudden trouble seeing out of one or both eyes.  You have sudden trouble walking.  You have dizziness or feel like you might pass out (faint).  You have a loss of balance or your movements are not steady (uncoordinated).  You have a sudden, severe headache with no known cause.  You have trouble swallowing (dysphagia). Call your local emergency services (911 in [place] Do notdrive yourself to the clinic or hospital. This information is not intended to replace advice given to you by your health care provider. Make sure you discuss any questions you have with your health care  provider. Document Released: 04/26/2012 Document Revised: 10/16/2015 Document Reviewed: 11/08/2012 Elsevier Interactive Patient Education  2018 [employer]vier Inc.  

## 2016-12-06 ENCOUNTER — Ambulatory Visit: Payer: Medicare HMO | Admitting: Physical Medicine & Rehabilitation

## 2016-12-07 ENCOUNTER — Encounter: Payer: Self-pay | Admitting: Family Medicine

## 2016-12-08 ENCOUNTER — Emergency Department: Payer: Medicare HMO

## 2016-12-08 ENCOUNTER — Emergency Department
Admission: EM | Admit: 2016-12-08 | Discharge: 2016-12-08 | Disposition: A | Discharge status: Home / Self Care | Payer: Medicare HMO | Attending: Emergency Medicine | Admitting: Emergency Medicine

## 2016-12-08 ENCOUNTER — Encounter: Payer: Self-pay | Admitting: Emergency Medicine

## 2016-12-08 DIAGNOSIS — R079 Chest pain, unspecified: Secondary | ICD-10-CM | POA: Diagnosis not present

## 2016-12-08 DIAGNOSIS — Z79899 Other long term (current) drug therapy: Secondary | ICD-10-CM | POA: Diagnosis not present

## 2016-12-08 DIAGNOSIS — R002 Palpitations: Secondary | ICD-10-CM

## 2016-12-08 DIAGNOSIS — R7303 Prediabetes: Secondary | ICD-10-CM | POA: Diagnosis not present

## 2016-12-08 DIAGNOSIS — Z7982 Long term (current) use of aspirin: Secondary | ICD-10-CM | POA: Insufficient documentation

## 2016-12-08 DIAGNOSIS — I10 Essential (primary) hypertension: Secondary | ICD-10-CM | POA: Diagnosis not present

## 2016-12-08 LAB — BASIC METABOLIC PANEL
ANION GAP: 6 (ref 5–15)
BUN: 11 mg/dL (ref 6–20)
CHLORIDE: 106 mmol/L (ref 101–111)
CO2: 27 mmol/L (ref 22–32)
Calcium: 8.8 mg/dL — ABNORMAL LOW (ref 8.9–10.3)
Creatinine, Ser: 1.13 mg/dL (ref 0.61–1.24)
Glucose, Bld: 111 mg/dL — ABNORMAL HIGH (ref 65–99)
POTASSIUM: 3.4 mmol/L — AB (ref 3.5–5.1)
SODIUM: 139 mmol/L (ref 135–145)

## 2016-12-08 LAB — CBC
HCT: 38.2 % — ABNORMAL LOW (ref 40.0–52.0)
HEMOGLOBIN: 12.7 g/dL — AB (ref 13.0–18.0)
MCH: 27.6 pg (ref 26.0–34.0)
MCHC: 33.4 g/dL (ref 32.0–36.0)
MCV: 82.8 fL (ref 80.0–100.0)
PLATELETS: 238 10*3/uL (ref 150–440)
RBC: 4.61 MIL/uL (ref 4.40–5.90)
RDW: 14.8 % — ABNORMAL HIGH (ref 11.5–14.5)
WBC: 4.5 10*3/uL (ref 3.8–10.6)

## 2016-12-08 LAB — MAGNESIUM: MAGNESIUM: 1.8 mg/dL (ref 1.7–2.4)

## 2016-12-08 LAB — TROPONIN I

## 2016-12-08 LAB — TSH: TSH: 5.742 u[IU]/mL — AB (ref 0.350–4.500)

## 2016-12-08 MED ORDER — SODIUM CHLORIDE 0.9 % IV BOLUS (SEPSIS)
500.0000 mL | Freq: Once | INTRAVENOUS | Status: AC
  Administered 2016-12-08: 500 mL via INTRAVENOUS

## 2016-12-08 NOTE — Discharge Instructions (Signed)
Please follow up with your primary care physician and cardiology. Please cut back on your caffeine intake and remain hydrated. Please return with any worsened symptoms.

## 2016-12-08 NOTE — ED Triage Notes (Addendum)
Patient ambulatory to triage with steady gait, without difficulty or distress noted; pt reports upon awakening at 245am "dreaming about the lottery, felt anxiety and hot and heart beating fast"; denies hx of same and denies c/o at present; pt accomp by wife and taken to room 18 by EDT for further eval

## 2016-12-08 NOTE — ED Provider Notes (Signed)
[facility] Emergency Department Provider Note   ____________________________________________   First MD Initiated Contact with Patient 12/08/16 0330     (approximate)  I have reviewed the triage vital signs and the nursing notes.   HISTORY  Chief Complaint Chest Pain    HPI Izaiah Tabb is a 61 y.o. male who comes into the hospital today with palpitations. The patient reports that he laid down tonight and asked his wife's turn the fan off. He woke up around 245 and felt hot. He also felt like his heart was racing. He went into the living room and per the family. He also felt little agitated and anxious. He denies any dizziness, chest pain or shortness of breath. He had been under the comforter which he reports he attributes to feeling so hot. The patient did not feel sweaty but again felt very warm. He states he has no air conditioning at home. The patient was concerned and didn't want to be anything going on so he decided to come into the hospital for evaluation. The patient was admitted to the intensive care unit and septic shock in December so he wanted to make sure that he was okay. He denies any nausea or vomiting with the symptoms. Right now his symptoms have improved. He is here today for evaluation.   Past Medical History:  Diagnosis Date  . Bowel obstruction ([facility])   . Chronic kidney disease   . Hyperlipidemia   . Hypertension   . Inguinal hernia   . Pink eye 06/21/2016  . Sepsis ([facility])   . Stroke [facility]) 12/24/2013   right side weakness    Patient Active Problem List   Diagnosis Date Noted  . Abdominal aortic atherosclerosis ([facility]) 05/20/2016  . Medication monitoring encounter 11/26/2015  . Left ear pain 07/21/2015  . Pre-diabetes 02/18/2015  . Atherosclerosis of both carotid arteries 02/18/2015  . 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  Shoulder subluxation, right 04/23/2014  . Spastic hemiplegia affecting dominant side ([facility]) 02/18/2014  . Hypertension goal BP (blood pressure) < 140/90 01/17/2014  . Hyperlipidemia LDL goal <70 01/17/2014    Past Surgical History:  Procedure Laterality Date  . INGUINAL HERNIA REPAIR Bilateral 06/25/2016   Procedure: HERNIA REPAIR INGUINAL ADULT BILATERAL;  Surgeon: Kieth Brightly, MD;  Location: [facility] ORS;  Service: General;  Laterality: Bilateral;  . KNEE ARTHROSCOPY Right 1970's    Prior to Admission medications   Medication Sig Start Date End Date Taking? Authorizing Provider  amLODipine (NORVASC) 10 MG tablet TAKE 1 TABLET (10 MG TOTAL) BY MOUTH DAILY. Patient taking differently: Take 10 mg by mouth every morning.  06/13/16  Yes Lada, Janit Bern, MD  aspirin EC 325 MG tablet Take 1 tablet (325 mg total) by mouth daily. 03/10/16  Yes Lada, Janit Bern, MD  losartan (COZAAR) 50 MG tablet TAKE 1 TABLET BY MOUTH EVERY DAY 11/25/16  Yes Lada, Janit Bern, MD  Multiple Vitamins-Minerals (ONE-A-DAY MENS HEALTH FORMULA) TABS Take 1 tablet by mouth daily.   Yes [provider]  Omega-3 Fatty Acids (FISH OIL) 1000 MG CAPS Take 1,000 mg by mouth daily.   Yes [provider]  rosuvastatin (CRESTOR) 40 MG tablet Take 1 tablet (40 mg total) by mouth daily. For cholesterol (this replaces atorvastatin) 12/01/16  Yes Lada, Janit Bern, MD    Allergies Patient has no known allergies.  Family History  Problem  Relation Age of Onset  . Hypertension Mother   . Hernia Sister   . Prostate cancer Neg Hx   . Bladder Cancer Neg Hx   . Kidney cancer Neg Hx     Social History Social History  Substance Use Topics  . Smoking status: Never Smoker  . Smokeless tobacco: Never Used  . Alcohol use No    Review of Systems  Constitutional: No fever/chills Eyes: No visual changes. ENT: No sore throat. Cardiovascular:Palpitations Respiratory: Denies shortness of breath. Gastrointestinal: No  abdominal pain.  No nausea, no vomiting.  No diarrhea.  No constipation. Genitourinary: Negative for dysuria. Musculoskeletal: Negative for back pain. Skin: Negative for rash. Neurological: Negative for headaches, focal weakness or numbness.   ____________________________________________   PHYSICAL EXAM:  VITAL SIGNS: ED Triage Vitals  Enc Vitals Group     BP 12/08/16 0316 (!) 185/88     Pulse Rate 12/08/16 0316 (!) 112     Resp 12/08/16 0316 20     Temp 12/08/16 0316 98.4 F (36.9 C)     Temp Source 12/08/16 0316 Oral     SpO2 12/08/16 0316 100 %     Weight 12/08/16 0317 218 lb (98.9 kg)     Height 12/08/16 0317 5\' 11"  (1.803 m)     Head Circumference --      Peak Flow --      Pain Score --      Pain Loc --      Pain Edu? --      Excl. in GC? --     Constitutional: Alert and oriented. Well appearing and in no acute distress. Eyes: Conjunctivae are normal. PERRL. EOMI. Head: Atraumatic. Nose: No congestion/rhinnorhea. Mouth/Throat: Mucous membranes are moist.  Oropharynx non-erythematous. Cardiovascular: Normal rate, regular rhythm. Grossly normal heart sounds.  Good peripheral circulation. Respiratory: Normal respiratory effort.  No retractions. Lungs CTAB. Gastrointestinal: Soft and nontender. No distention. Musculoskeletal: No lower extremity tenderness nor edema.   Neurologic:  Normal speech and language.  Skin:  Skin is warm, dry and intact.  Psychiatric: Mood and affect are normal.   ____________________________________________   LABS (all labs ordered are listed, but only abnormal results are displayed)  Labs Reviewed  CBC - Abnormal; Notable for the following:       Result Value   Hemoglobin 12.7 (*)    HCT 38.2 (*)    RDW 14.8 (*)    All other components within normal limits  BASIC METABOLIC PANEL - Abnormal; Notable for the following:    Potassium 3.4 (*)    Glucose, Bld 111 (*)    Calcium 8.8 (*)    All other components within normal limits    TSH - Abnormal; Notable for the following:    TSH 5.742 (*)    All other components within normal limits  TROPONIN I  MAGNESIUM   ____________________________________________  EKG  ED ECG REPORT I, Rebecka ApleyWebster,  Elsie Sakuma P, the attending physician, personally viewed and interpreted this ECG.   Date: 12/08/2016  EKG Time: 316  Rate: 108  Rhythm: sinus tachycardia  Axis: left axis deviation  Intervals:none  ST&T Change: none  ____________________________________________  RADIOLOGY  Dg Chest Portable 1 View  Result Date: 12/08/2016 CLINICAL DATA:  61 year old male with chest pain. EXAM: PORTABLE CHEST 1 VIEW COMPARISON:  Chest radiograph dated 05/11/2016 FINDINGS: The heart size and mediastinal contours are within normal limits. Both lungs are clear. The visualized skeletal structures are unremarkable. IMPRESSION: No active disease. Electronically Signed  By: Elgie Collard M.D.   On: 12/08/2016 03:57    ____________________________________________   PROCEDURES  Procedure(s) performed: None  Procedures  Critical Care performed: No  ____________________________________________   INITIAL IMPRESSION / ASSESSMENT AND PLAN / ED COURSE  Pertinent labs & imaging results that were available during my care of the patient were reviewed by me and considered in my medical decision making (see chart for details).  This is a 61 year old male who comes into the hospital today with palpitations. The patient went to bed and woke up feeling hot. He has no air conditioning at home so he was physically hot in the house. I will check some blood work and give the patient a 500 mL bolus of normal saline. I will reassess the patient once I received all of his results.     The patient is comfortable and has no distress at this time. He denies having chest pain or dizziness with these symptoms. The patient had some sinus tachycardia when he initially arrived but it did give him small 500 mL  bolus of normal saline. His heart rate improved to 78. His blood work is also unremarkable. As the patient denies any pain with this and reports that this was brought on by the heat in his house at the time I feel that the patient will be discharged to home. I informed the patient of his TSH and that he should follow-up with his primary care doctor to have that further evaluated. I also discussed with the patient decreasing caffeine and remaining hydrated. The patient has wife understand and agree with the plan as stated. He'll be discharged home to follow-up. ____________________________________________   FINAL CLINICAL IMPRESSION(S) / ED DIAGNOSES  Final diagnoses:  Palpitations      NEW MEDICATIONS STARTED DURING THIS VISIT:  New Prescriptions   No medications on file     Note:  This document was prepared using Dragon voice recognition software and may include unintentional dictation errors.    Rebecka Apley, MD 12/08/16 [phone]

## 2016-12-26 ENCOUNTER — Other Ambulatory Visit: Payer: Self-pay | Admitting: Family Medicine

## 2016-12-27 NOTE — Telephone Encounter (Signed)
Bmp checked at hospital 2 weeks after losartan started Rx approved

## 2016-12-31 ENCOUNTER — Ambulatory Visit: Payer: Medicare HMO | Admitting: Physical Medicine & Rehabilitation

## 2017-02-02 ENCOUNTER — Ambulatory Visit (INDEPENDENT_AMBULATORY_CARE_PROVIDER_SITE_OTHER): Payer: Medicare HMO | Admitting: Vascular Surgery

## 2017-02-02 ENCOUNTER — Encounter (INDEPENDENT_AMBULATORY_CARE_PROVIDER_SITE_OTHER): Payer: Medicare HMO

## 2017-02-04 ENCOUNTER — Ambulatory Visit: Payer: Medicare HMO | Admitting: Physical Medicine & Rehabilitation

## 2017-02-04 ENCOUNTER — Telehealth: Payer: Self-pay | Admitting: Physical Medicine & Rehabilitation

## 2017-02-04 NOTE — Telephone Encounter (Signed)
PHONED HOME NA PHONED [facility] [employer] LEFT VOICEMAIL THAT WE  NEED TO RESCHEDULE OFFICE VISIT FOR TODAY-- CALL BACK TO CONFIRM PLEASE

## 2017-02-07 ENCOUNTER — Encounter: Payer: Medicare HMO | Attending: Physical Medicine & Rehabilitation

## 2017-02-07 ENCOUNTER — Encounter: Payer: Self-pay | Admitting: Physical Medicine & Rehabilitation

## 2017-02-07 ENCOUNTER — Ambulatory Visit (HOSPITAL_BASED_OUTPATIENT_CLINIC_OR_DEPARTMENT_OTHER): Payer: Medicare HMO | Admitting: Physical Medicine & Rehabilitation

## 2017-02-07 VITALS — BP 157/92 | HR 81

## 2017-02-07 DIAGNOSIS — G8111 Spastic hemiplegia affecting right dominant side: Secondary | ICD-10-CM | POA: Diagnosis not present

## 2017-02-07 DIAGNOSIS — G8110 Spastic hemiplegia affecting unspecified side: Secondary | ICD-10-CM | POA: Diagnosis not present

## 2017-02-07 DIAGNOSIS — M25511 Pain in right shoulder: Secondary | ICD-10-CM | POA: Diagnosis not present

## 2017-02-07 DIAGNOSIS — Z7982 Long term (current) use of aspirin: Secondary | ICD-10-CM | POA: Diagnosis not present

## 2017-02-07 DIAGNOSIS — I69351 Hemiplegia and hemiparesis following cerebral infarction affecting right dominant side: Secondary | ICD-10-CM | POA: Insufficient documentation

## 2017-02-07 NOTE — Progress Notes (Signed)
Subjective:    Patient ID: [name], male    DOB: 1956-03-03, 61 y.o.   MRN: [id number]  HPI Botox Injection for spasticity using needle EMG guidance  Dilution: 50 Units/ml Indication: Severe spasticity which interferes with ADL,mobility and/or  hygiene and is unresponsive to medication management and other conservative care Informed consent was obtained after describing risks and benefits of the procedure with the patient. This includes bleeding, bruising, infection, excessive weakness, or medication side effects. A REMS form is on file and signed. Needle: 50mm 25g needle electrode Number of units per muscle RIGHT Biceps100 Brachialis 100 FCR25 Pec 50 FDS25 FDP50  Brachioradialis 50 All injections were done after obtaining appropriate EMG activity and after negative drawback for blood. The patient tolerated the procedure well. Post procedure instructions were given. A followup appointment was made.   Pain Inventory Average Pain 2 Pain Right Now 1 My pain is intermittent, tingling and aching  In the last 24 hours, has pain interfered with the following? Thomas activity 0 Relation with others 0 Enjoyment of life 0 What TIME of day is your pain at its worst? . Sleep (in Thomas) Fair  Pain is worse with: inactivity Pain improves with: therapy/exercise, TENS and injections Relief from Meds: 5  Mobility walk without assistance use a cane ability to climb steps?  yes do you drive?  yes  Function disabled: date disabled 8-15  Neuro/Psych No problems in this area  Prior Studies Any changes since last visit?  no  Physicians involved in your care Any changes since last visit?  no   Family History  Problem Relation Age of Onset  . Hypertension Mother   . Hernia Sister   . Prostate cancer Neg Hx   . Bladder Cancer Neg Hx   . Kidney cancer Neg Hx    Social History   Social History  . Marital status: Married    Spouse name: N/A  . Number of children:  N/A  . Years of education: N/A   Social History Main Topics  . Smoking status: Never Smoker  . Smokeless tobacco: Never Used  . Alcohol use No  . Drug use: Yes    Types: Marijuana     Comment: > 20 YEARS AGO  . Sexual activity: No   Other Topics Concern  . Not on file   Social History Narrative  . No narrative on file   Past Surgical History:  Procedure Laterality Date  . INGUINAL HERNIA REPAIR Bilateral 06/25/2016   Procedure: HERNIA REPAIR INGUINAL ADULT BILATERAL;  Surgeon: Kieth Brightly, MD;  Location: [facility] ORS;  Service: Thomas;  Laterality: Bilateral;  . KNEE ARTHROSCOPY Right 1970's   Past Medical History:  Diagnosis Date  . Bowel obstruction (HCC)   . Chronic kidney disease   . Hyperlipidemia   . Hypertension   . Inguinal hernia   . Pink eye 06/21/2016  . Sepsis (HCC)   . Stroke (HCC) 12/24/2013   right side weakness   There were no vitals taken for this visit.  Opioid Risk Score:   Fall Risk Score:  `1  Depression screen PHQ 2/9  Depression screen [facility] 2/9 11/11/2016 10/25/2016 05/20/2016 04/19/2016 01/12/2016 11/18/2015 10/06/2015  Decreased Interest 0 0 0 0 0 0 0  Down, Depressed, Hopeless 0 0 0 0 0 0 -  PHQ - 2 Score 0 0 0 0 0 0 0  Altered sleeping - - - - - - -  Tired, decreased energy - - - - - - -  Change in appetite - - - - - - -  Feeling bad or failure about yourself  - - - - - - -  Trouble concentrating - - - - - - -  Moving slowly or fidgety/restless - - - - - - -  Suicidal thoughts - - - - - - -  PHQ-9 Score - - - - - - -     Review of Systems  Constitutional: Negative.   HENT: Negative.   Eyes: Negative.   Respiratory: Negative.   Cardiovascular: Negative.   Gastrointestinal: Negative.   Endocrine: Negative.   Genitourinary: Negative.   Musculoskeletal: Negative.   Skin: Negative.   Allergic/Immunologic: Negative.   Neurological: Negative.   Hematological: Negative.   Psychiatric/Behavioral: Negative.   All other systems  reviewed and are negative.      Objective:   Physical Exam        Assessment & Plan:  1. 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

## 2017-02-07 NOTE — Patient Instructions (Signed)

## 2017-02-10 ENCOUNTER — Encounter: Payer: Self-pay | Admitting: Family Medicine

## 2017-02-14 ENCOUNTER — Encounter: Payer: Self-pay | Admitting: Family Medicine

## 2017-02-15 ENCOUNTER — Other Ambulatory Visit: Payer: Self-pay | Admitting: Family Medicine

## 2017-02-15 NOTE — Telephone Encounter (Signed)
Left detailed voice message for patient.  

## 2017-02-15 NOTE — Telephone Encounter (Signed)
Please remind patient that he is due for fasting labs to check his cholesterol He can have all these done when he comes in next week for his BMP and visit with me; please make sure he has an appointment and ask him to come fasting; see MyChart notes

## 2017-03-06 ENCOUNTER — Encounter: Payer: Self-pay | Admitting: Family Medicine

## 2017-03-07 ENCOUNTER — Other Ambulatory Visit: Payer: Self-pay | Admitting: Family Medicine

## 2017-03-07 MED ORDER — LOSARTAN POTASSIUM 100 MG PO TABS: 100.0000 mg | ORAL_TABLET | Freq: Every day | ORAL | 0 refills | Status: DC

## 2017-03-07 NOTE — Addendum Note (Signed)
Addended by: LADA, Janit Bern on: 03/07/2017 04:40 PM   Modules accepted: Orders

## 2017-03-08 NOTE — Telephone Encounter (Signed)
Asher Muir, please update pharmacies; thank you

## 2017-03-09 ENCOUNTER — Encounter: Payer: Self-pay | Admitting: Family Medicine

## 2017-03-09 ENCOUNTER — Ambulatory Visit (INDEPENDENT_AMBULATORY_CARE_PROVIDER_SITE_OTHER): Payer: Medicare HMO | Admitting: Family Medicine

## 2017-03-09 VITALS — BP 152/80 | HR 90 | Temp 98.7°F | Ht 71.0 in | Wt 225.6 lb

## 2017-03-09 DIAGNOSIS — I700 Atherosclerosis of aorta: Secondary | ICD-10-CM | POA: Diagnosis not present

## 2017-03-09 DIAGNOSIS — G8110 Spastic hemiplegia affecting unspecified side: Secondary | ICD-10-CM | POA: Diagnosis not present

## 2017-03-09 DIAGNOSIS — I10 Essential (primary) hypertension: Secondary | ICD-10-CM | POA: Diagnosis not present

## 2017-03-09 DIAGNOSIS — Z5181 Encounter for therapeutic drug level monitoring: Secondary | ICD-10-CM

## 2017-03-09 DIAGNOSIS — E039 Hypothyroidism, unspecified: Secondary | ICD-10-CM

## 2017-03-09 DIAGNOSIS — R7303 Prediabetes: Secondary | ICD-10-CM

## 2017-03-09 DIAGNOSIS — Z23 Encounter for immunization: Secondary | ICD-10-CM

## 2017-03-09 DIAGNOSIS — I63512 Cerebral infarction due to unspecified occlusion or stenosis of left middle cerebral artery: Secondary | ICD-10-CM

## 2017-03-09 DIAGNOSIS — I6523 Occlusion and stenosis of bilateral carotid arteries: Secondary | ICD-10-CM

## 2017-03-09 DIAGNOSIS — E785 Hyperlipidemia, unspecified: Secondary | ICD-10-CM

## 2017-03-09 NOTE — Patient Instructions (Addendum)
Okay to cancel the December appt; I'll see you back fasting for labs and visit with me in 6+ months We'll see what Dr. Cherylann RatelLateef wants to do with your blood pressure medicine Return to see CMA in 2 weeks for recheck of blood pressure  You might consider the new shingles vaccine called Shingrix, and you can talk to your pharmacist about getting this; since you got a flu shot today, do NOT get the first injection for at least 30 days  You received the flu shot today; it should protect you against the flu virus over the coming months; it will take about two weeks for antibodies to develop; do try to stay away from hospitals, nursing homes, and daycares during peak flu season; taking extra vitamin C daily during flu season may help you avoid getting sick   DASH Eating Plan DASH stands for "Dietary Approaches to Stop Hypertension." The DASH eating plan is a healthy eating plan that has been shown to reduce high blood pressure (hypertension). It may also reduce your risk for type 2 diabetes, heart disease, and stroke. The DASH eating plan may also help with weight loss. What are tips for following this plan? General guidelines  Avoid eating more than 2,300 mg (milligrams) of salt (sodium) a day. If you have hypertension, you may need to reduce your sodium intake to 1,500 mg a day.  Limit alcohol intake to no more than 1 drink a day for nonpregnant women and 2 drinks a day for men. One drink equals 12 oz of beer, 5 oz of wine, or 1 oz of hard liquor.  Work with your health care provider to maintain a healthy body weight or to lose weight. Ask what an ideal weight is for you.  Get at least 30 minutes of exercise that causes your heart to beat faster (aerobic exercise) most days of the week. Activities may include walking, swimming, or biking.  Work with your health care provider or diet and nutrition specialist (dietitian) to adjust your eating plan to your individual calorie needs. Reading food  labels  Check food labels for the amount of sodium per serving. Choose foods with less than 5 percent of the Daily Value of sodium. Generally, foods with less than 300 mg of sodium per serving fit into this eating plan.  To find whole grains, look for the word "whole" as the first word in the ingredient list. Shopping  Buy products labeled as "low-sodium" or "no salt added."  Buy fresh foods. Avoid canned foods and premade or frozen meals. Cooking  Avoid adding salt when cooking. Use salt-free seasonings or herbs instead of table salt or sea salt. Check with your health care provider or pharmacist before using salt substitutes.  Do not fry foods. Cook foods using healthy methods such as baking, boiling, grilling, and broiling instead.  Cook with heart-healthy oils, such as olive, canola, soybean, or sunflower oil. Meal planning   Eat a balanced diet that includes: ? 5 or more servings of fruits and vegetables each day. At each meal, try to fill half of your plate with fruits and vegetables. ? Up to 6-8 servings of whole grains each day. ? Less than 6 oz of lean meat, poultry, or fish each day. A 3-oz serving of meat is about the same size as a deck of cards. One egg equals 1 oz. ? 2 servings of low-fat dairy each day. ? A serving of nuts, seeds, or beans 5 times each week. ? Heart-healthy fats.  Healthy fats called Omega-3 fatty acids are found in foods such as flaxseeds and coldwater fish, like sardines, salmon, and mackerel.  Limit how much you eat of the following: ? Canned or prepackaged foods. ? Food that is high in trans fat, such as fried foods. ? Food that is high in saturated fat, such as fatty meat. ? Sweets, desserts, sugary drinks, and other foods with added sugar. ? Full-fat dairy products.  Do not salt foods before eating.  Try to eat at least 2 vegetarian meals each week.  Eat more home-cooked food and less restaurant, buffet, and fast food.  When eating at a  restaurant, ask that your food be prepared with less salt or no salt, if possible. What foods are recommended? The items listed may not be a complete list. Talk with your dietitian about what dietary choices are best for you. Grains Whole-grain or whole-wheat bread. Whole-grain or whole-wheat pasta. Brown rice. [place] Morrow. Bulgur. Whole-grain and low-sodium cereals. Pita bread. Low-fat, low-sodium crackers. Whole-wheat flour tortillas. Vegetables Fresh or frozen vegetables (raw, steamed, roasted, or grilled). Low-sodium or reduced-sodium tomato and vegetable juice. Low-sodium or reduced-sodium tomato sauce and tomato paste. Low-sodium or reduced-sodium canned vegetables. Fruits All fresh, dried, or frozen fruit. Canned fruit in natural juice (without added sugar). Meat and other protein foods Skinless chicken or [place]. Ground chicken or [place]. Pork with fat trimmed off. Fish and seafood. Egg whites. Dried beans, peas, or lentils. Unsalted nuts, nut butters, and seeds. Unsalted canned beans. Lean cuts of beef with fat trimmed off. Low-sodium, lean deli meat. Dairy Low-fat (1%) or fat-free (skim) milk. Fat-free, low-fat, or reduced-fat cheeses. Nonfat, low-sodium ricotta or cottage cheese. Low-fat or nonfat yogurt. Low-fat, low-sodium cheese. Fats and oils Soft margarine without trans fats. Vegetable oil. Low-fat, reduced-fat, or light mayonnaise and salad dressings (reduced-sodium). Canola, safflower, olive, soybean, and sunflower oils. Avocado. Seasoning and other foods Herbs. Spices. Seasoning mixes without salt. Unsalted popcorn and pretzels. Fat-free sweets. What foods are not recommended? The items listed may not be a complete list. Talk with your dietitian about what dietary choices are best for you. Grains Baked goods made with fat, such as croissants, muffins, or some breads. Dry pasta or rice meal packs. Vegetables Creamed or fried vegetables. Vegetables in a cheese sauce.  Regular canned vegetables (not low-sodium or reduced-sodium). Regular canned tomato sauce and paste (not low-sodium or reduced-sodium). Regular tomato and vegetable juice (not low-sodium or reduced-sodium). Angie Fava. Olives. Fruits Canned fruit in a light or heavy syrup. Fried fruit. Fruit in cream or butter sauce. Meat and other protein foods Fatty cuts of meat. Ribs. Fried meat. Berniece Salines. Sausage. Bologna and other processed lunch meats. Salami. Fatback. Hotdogs. Bratwurst. Salted nuts and seeds. Canned beans with added salt. Canned or smoked fish. Whole eggs or egg yolks. Chicken or [place] with skin. Dairy Whole or 2% milk, cream, and half-and-half. Whole or full-fat cream cheese. Whole-fat or sweetened yogurt. Full-fat cheese. Nondairy creamers. Whipped toppings. Processed cheese and cheese spreads. Fats and oils Butter. Stick margarine. Lard. Shortening. Ghee. Bacon fat. Tropical oils, such as coconut, palm kernel, or palm oil. Seasoning and other foods Salted popcorn and pretzels. Onion salt, garlic salt, seasoned salt, table salt, and sea salt. Worcestershire sauce. Tartar sauce. Barbecue sauce. Teriyaki sauce. Soy sauce, including reduced-sodium. Steak sauce. Canned and packaged gravies. Fish sauce. Oyster sauce. Cocktail sauce. Horseradish that you find on the shelf. Ketchup. Mustard. Meat flavorings and tenderizers. Bouillon cubes. Hot sauce and Tabasco sauce. Premade or  packaged marinades. Premade or packaged taco seasonings. Relishes. Regular salad dressings. Where to find more information:  National Heart, Lung, and Blood Institute: PopSteam.is  American Heart Association: www.heart.org Summary  The DASH eating plan is a healthy eating plan that has been shown to reduce high blood pressure (hypertension). It may also reduce your risk for type 2 diabetes, heart disease, and stroke.  With the DASH eating plan, you should limit salt (sodium) intake to 2,300 mg a day. If you have  hypertension, you may need to reduce your sodium intake to 1,500 mg a day.  When on the DASH eating plan, aim to eat more fresh fruits and vegetables, whole grains, lean proteins, low-fat dairy, and heart-healthy fats.  Work with your health care provider or diet and nutrition specialist (dietitian) to adjust your eating plan to your individual calorie needs. This information is not intended to replace advice given to you by your health care provider. Make sure you discuss any questions you have with your health care provider. Document Released: 04/29/2011 Document Revised: 05/03/2016 Document Reviewed: 05/03/2016 Elsevier Interactive Patient Education  2017 [employer].

## 2017-03-09 NOTE — Assessment & Plan Note (Signed)
Work on weight loss; contact kidney doctor about either starting back low dose chlorthalidone OR something else; try DASH guidelines

## 2017-03-09 NOTE — Progress Notes (Signed)
BP (!) 152/80 (BP Location: Left Arm, Patient Position: Sitting, Cuff Size: Normal)   Pulse 90   Temp 98.7 F (37.1 C) (Oral)   Ht 5\' 11"  (1.803 m)   Wt 225 lb 9.6 oz (102.3 kg)   SpO2 99%   BMI 31.46 kg/m    Subjective:    Patient ID: [name], male    DOB: 06/06/1955, 61 y.o.   MRN: [id number]  HPI: [name] is a 61 y.o. male  Chief Complaint  Patient presents with  . Follow-up  . Immunizations    Flu vaccine given today    HPI Patient is here for f/u of high blood pressure; at home,  They took him off of all the medicine when he was septic; before that it was 127/76, after the hospital, it trended up He called the kidney doctor and they put him on amlo, then started back on the AR[facility] On 100 mg of losartan today, also on amlodipine 10 mg daily No pork, avoiding salt; 141/81 at home yesterday; 137/something; staying kind of higher than normal; Dr. Lyda Jesterurtis says his runs higher than normal Gained 7 pounds, cookies and ice cream he admits; no fluid overload, no fluid in the legs; not as much bike riding  He is getting botox injections for spastic hemiplegia, s/p stroke; Dr. Claudette LawsAndrew Kirsteins (physical medicine) BP there was high, 157/92  Prediabetes; some uncles and aunts with diabetes, no first degree relative; not drinking much soft drinks, just a little ginger ale; wheat bread Lab Results  Component Value Date   HGBA1C 6.0 (H) 11/11/2016    CKD; seeing kidney doctor, Dr. Cherylann RatelLateef; last creatinine reviewed, much improved after his episode of sepsis  Hypothyroidism; high TSH 10 months and 3 months ago Lab Results  Component Value Date   TSH 5.742 (H) 12/08/2016    Depression screen PHQ 2/9 03/09/2017 11/11/2016 10/25/2016 05/20/2016 04/19/2016  Decreased Interest 0 0 0 0 0  Down, Depressed, Hopeless 0 0 0 0 0  PHQ - 2 Score 0 0 0 0 0  Altered sleeping - - - - -  Tired, decreased energy - - - - -  Change in appetite - - - - -  Feeling bad or failure about  yourself  - - - - -  Trouble concentrating - - - - -  Moving slowly or fidgety/restless - - - - -  Suicidal thoughts - - - - -  PHQ-9 Score - - - - -    Relevant past medical, surgical, family and social history reviewed Past Medical History:  Diagnosis Date  . Bowel obstruction (HCC)   . Chronic kidney disease   . Hyperlipidemia   . Hypertension   . Inguinal hernia   . Pink eye 06/21/2016  . Sepsis (HCC)   . Stroke [facility](HCC) 12/24/2013   right side weakness   Past Surgical History:  Procedure Laterality Date  . INGUINAL HERNIA REPAIR Bilateral 06/25/2016   Procedure: HERNIA REPAIR INGUINAL ADULT BILATERAL;  Surgeon: Kieth BrightlySeeplaputhur G Sankar, MD;  Location: [facility] ORS;  Service: General;  Laterality: Bilateral;  . KNEE ARTHROSCOPY Right 1970's   Family History  Problem Relation Age of Onset  . Hypertension Mother   . Hernia Sister   . Prostate cancer Neg Hx   . Bladder Cancer Neg Hx   . Kidney cancer Neg Hx    Social History   Social History  . Marital status: Married    Spouse name: N/A  . Number of  children: N/A  . Years of education: N/A   Occupational History  . Not on file.   Social History Main Topics  . Smoking status: Never Smoker  . Smokeless tobacco: Never Used  . Alcohol use No  . Drug use: Yes    Types: Marijuana     Comment: > 20 YEARS AGO  . Sexual activity: No   Other Topics Concern  . Not on file   Social History Narrative  . No narrative on file    Interim medical history since last visit reviewed. Allergies and medications reviewed  Review of Systems  Constitutional: Negative for unexpected weight change (admits to eating well).  Cardiovascular: Negative for chest pain and leg swelling.   Per HPI unless specifically indicated above     Objective:    BP (!) 152/80 (BP Location: Left Arm, Patient Position: Sitting, Cuff Size: Normal)   Pulse 90   Temp 98.7 F (37.1 C) (Oral)   Ht 5\' 11"  (1.803 m)   Wt 225 lb 9.6 oz (102.3 kg)   SpO2  99%   BMI 31.46 kg/m   Wt Readings from Last 3 Encounters:  03/09/17 225 lb 9.6 oz (102.3 kg)  12/08/16 218 lb (98.9 kg)  12/03/16 218 lb (98.9 kg)    Physical Exam  Constitutional: He appears well-developed and well-nourished. No distress.  Weight gain noted  HENT:  Head: Normocephalic and atraumatic.  Eyes: EOM are normal. No scleral icterus.  Neck: No JVD present. No thyromegaly present.  Cardiovascular: Normal rate and regular rhythm.   Pulmonary/Chest: Effort normal and breath sounds normal.  Abdominal: Soft. Bowel sounds are normal. He exhibits no distension.  Musculoskeletal: He exhibits no edema.  Neurological: He displays no tremor. He exhibits abnormal muscle tone. Coordination normal.  Right UE weakness, slight contracture of hand/arm; wearing right AFO  Skin: Skin is warm and dry. No pallor.  Psychiatric: He has a normal mood and affect. His behavior is normal. Judgment and thought content normal. His mood appears not anxious. He does not exhibit a depressed mood.    Results for orders placed or performed during the hospital encounter of 12/08/16  CBC  Result Value Ref Range   WBC 4.5 3.8 - 10.6 K/uL   RBC 4.61 4.40 - 5.90 MIL/uL   Hemoglobin 12.7 (L) 13.0 - 18.0 g/dL   HCT 16.1 (L) 09.6 - 04.5 %   MCV 82.8 80.0 - 100.0 fL   MCH 27.6 26.0 - 34.0 pg   MCHC 33.4 32.0 - 36.0 g/dL   RDW 40.9 (H) 81.1 - 91.4 %   Platelets 238 150 - 440 K/uL  Basic metabolic panel  Result Value Ref Range   Sodium 139 135 - 145 mmol/L   Potassium 3.4 (L) 3.5 - 5.1 mmol/L   Chloride 106 101 - 111 mmol/L   CO2 27 22 - 32 mmol/L   Glucose, Bld 111 (H) 65 - 99 mg/dL   BUN 11 6 - 20 mg/dL   Creatinine, Ser 7.82 0.61 - 1.24 mg/dL   Calcium 8.8 (L) 8.9 - 10.3 mg/dL   GFR calc non Af Amer >60 >60 mL/min   GFR calc Af Amer >60 >60 mL/min   Anion gap 6 5 - 15  Troponin I  Result Value Ref Range   Troponin I <0.03 <0.03 ng/mL  Magnesium  Result Value Ref Range   Magnesium 1.8 1.7 -  2.4 mg/dL  TSH  Result Value Ref Range   TSH 5.742 (H)  0.350 - 4.500 uIU/mL      Assessment & Plan:   Problem List Items Addressed This Visit      Cardiovascular and Mediastinum   Hypertension goal BP (blood pressure) < 140/90 - Primary    Work on weight loss; contact kidney doctor about either starting back low dose chlorthalidone OR something else; try DASH guidelines      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)    With residual spastic hemiplegia      Atherosclerosis of both carotid arteries    Due for next scan in 2020 per vascular surgeon; continue statin and aspirin      Relevant Orders   Lipid panel   Abdominal aortic atherosclerosis (HCC)    Limit LDL to no higher than 70      Relevant Orders   Lipid panel     Endocrine   Hypothyroidism    Check TSH today      Relevant Orders   TSH     Nervous and Auditory   Spastic hemiplegia affecting dominant side (HCC)    Getting botox from PM&R doctor; continue AFO on the right        Other   Pre-diabetes    Check A1c      Relevant Orders   Hemoglobin A1c   Medication monitoring encounter    Check liver and kidney function      Relevant Orders   COMPLETE METABOLIC PANEL WITH GFR   Hyperlipidemia LDL goal <70    Avoiding fatty meats; check lipids today; on statin      Relevant Orders   Lipid panel    Other Visit Diagnoses    Flu vaccine need       Relevant Orders   Flu Vaccine QUAD 36+ mos IM (Completed)       Follow up plan: Return in about 2 weeks (around 03/23/2017) for blood pressure recheck with CMA; 6+ months with Dr. Sherie Don, fasting.  An after-visit summary was printed and given to the patient at check-out.  Please see the patient instructions which may contain other information and recommendations beyond what is mentioned above in the assessment and plan.  Meds ordered this encounter  Medications  . DISCONTD: atorvastatin (LIPITOR) 80 MG tablet    Sig:  Take by mouth.  . DISCONTD: losartan (COZAAR) 50 MG tablet    Sig: Take 50 mg by mouth daily.    Refill:  2    Orders Placed This Encounter  Procedures  . Flu Vaccine QUAD 36+ mos IM  . COMPLETE METABOLIC PANEL WITH GFR  . Hemoglobin A1c  . Lipid panel  . TSH

## 2017-03-09 NOTE — Assessment & Plan Note (Signed)
Due for next scan in 2020 per vascular surgeon; continue statin and aspirin

## 2017-03-09 NOTE — Assessment & Plan Note (Signed)
Check TSH today

## 2017-03-09 NOTE — Assessment & Plan Note (Signed)
Check A1c. 

## 2017-03-09 NOTE — Assessment & Plan Note (Signed)
Getting botox from PM&R doctor; continue AFO on the right

## 2017-03-09 NOTE — Assessment & Plan Note (Signed)
Limit LDL to no higher than 70

## 2017-03-09 NOTE — Assessment & Plan Note (Signed)
Check liver and kidney function 

## 2017-03-09 NOTE — Assessment & Plan Note (Signed)
Avoiding fatty meats; check lipids today; on statin

## 2017-03-09 NOTE — Assessment & Plan Note (Signed)
With residual spastic hemiplegia

## 2017-03-10 LAB — COMPLETE METABOLIC PANEL WITH GFR
AG Ratio: 1.4 (calc) (ref 1.0–2.5)
ALBUMIN MSPROF: 4.2 g/dL (ref 3.6–5.1)
ALKALINE PHOSPHATASE (APISO): 96 U/L (ref 40–115)
ALT: 24 U/L (ref 9–46)
AST: 22 U/L (ref 10–35)
BUN: 13 mg/dL (ref 7–25)
CO2: 28 mmol/L (ref 20–32)
CREATININE: 1.14 mg/dL (ref 0.70–1.25)
Calcium: 9.4 mg/dL (ref 8.6–10.3)
Chloride: 103 mmol/L (ref 98–110)
GFR, Est African American: 79 mL/min/{1.73_m2} (ref >=60)
GFR, Est Non African American: 69 mL/min/{1.73_m2} (ref >=60)
Globulin: 3.1 g/dL (calc) (ref 1.9–3.7)
Glucose, Bld: 95 mg/dL (ref 65–99)
Potassium: 3.9 mmol/L (ref 3.5–5.3)
Sodium: 138 mmol/L (ref 135–146)
Total Bilirubin: 0.6 mg/dL (ref 0.2–1.2)
Total Protein: 7.3 g/dL (ref 6.1–8.1)

## 2017-03-10 LAB — LIPID PANEL
CHOL/HDL RATIO: 2.9 (calc) (ref <5.0)
CHOLESTEROL: 158 mg/dL (ref <200)
HDL: 54 mg/dL (ref >40)
LDL CHOLESTEROL (CALC): 91 mg/dL
Non-HDL Cholesterol (Calc): 104 mg/dL (calc) (ref <130)
Triglycerides: 52 mg/dL (ref <150)

## 2017-03-10 LAB — HEMOGLOBIN A1C
Hgb A1c MFr Bld: 5.9 % of total Hgb — ABNORMAL HIGH (ref <5.7)
MEAN PLASMA GLUCOSE: 123 (calc)
eAG (mmol/L): 6.8 (calc)

## 2017-03-10 LAB — TSH: TSH: 2.66 mIU/L (ref 0.40–4.50)

## 2017-03-14 ENCOUNTER — Other Ambulatory Visit: Payer: Self-pay | Admitting: Family Medicine

## 2017-03-14 DIAGNOSIS — I10 Essential (primary) hypertension: Secondary | ICD-10-CM

## 2017-03-21 ENCOUNTER — Ambulatory Visit: Payer: Medicare HMO | Admitting: Physical Medicine & Rehabilitation

## 2017-03-23 ENCOUNTER — Ambulatory Visit: Payer: Medicare HMO

## 2017-03-30 ENCOUNTER — Encounter: Payer: Self-pay | Admitting: Family Medicine

## 2017-03-31 ENCOUNTER — Telehealth: Payer: Self-pay | Admitting: Urology

## 2017-03-31 MED ORDER — LOSARTAN POTASSIUM 100 MG PO TABS
100.0000 mg | ORAL_TABLET | Freq: Every day | ORAL | 5 refills | Status: DC
Start: 2017-03-31 — End: 2017-04-18

## 2017-03-31 NOTE — Telephone Encounter (Signed)
Patient cx follow up said he would reschedule after the first of the year once he has new insurance  michelle

## 2017-04-05 ENCOUNTER — Ambulatory Visit: Payer: Medicare HMO | Admitting: Physical Medicine & Rehabilitation

## 2017-04-05 ENCOUNTER — Encounter: Payer: Medicare HMO | Attending: Physical Medicine & Rehabilitation

## 2017-04-05 DIAGNOSIS — I69351 Hemiplegia and hemiparesis following cerebral infarction affecting right dominant side: Secondary | ICD-10-CM | POA: Insufficient documentation

## 2017-04-05 DIAGNOSIS — M25511 Pain in right shoulder: Secondary | ICD-10-CM | POA: Insufficient documentation

## 2017-04-05 DIAGNOSIS — Z7982 Long term (current) use of aspirin: Secondary | ICD-10-CM | POA: Insufficient documentation

## 2017-04-07 ENCOUNTER — Encounter: Payer: Self-pay | Admitting: Family Medicine

## 2017-04-18 ENCOUNTER — Other Ambulatory Visit: Payer: Self-pay

## 2017-04-18 MED ORDER — LOSARTAN POTASSIUM 100 MG PO TABS: 100.0000 mg | ORAL_TABLET | Freq: Every day | ORAL | 5 refills | Status: DC

## 2017-04-18 NOTE — Telephone Encounter (Signed)
Ins requesting 90 day 

## 2017-04-19 ENCOUNTER — Other Ambulatory Visit: Payer: Self-pay

## 2017-04-19 ENCOUNTER — Ambulatory Visit: Payer: Medicare HMO | Admitting: Urology

## 2017-04-19 MED ORDER — LOSARTAN POTASSIUM 100 MG PO TABS: 100.0000 mg | ORAL_TABLET | Freq: Every day | ORAL | 1 refills | Status: DC

## 2017-04-19 NOTE — Telephone Encounter (Signed)
Insurance requesting 90 days

## 2017-04-20 ENCOUNTER — Ambulatory Visit: Payer: Medicare HMO | Admitting: Urology

## 2017-05-13 ENCOUNTER — Ambulatory Visit: Payer: Medicare HMO | Admitting: Family Medicine

## 2017-05-25 ENCOUNTER — Encounter: Payer: Self-pay | Admitting: Physical Medicine & Rehabilitation

## 2017-06-07 ENCOUNTER — Encounter: Payer: Self-pay | Admitting: Physical Medicine & Rehabilitation

## 2017-06-07 ENCOUNTER — Encounter: Payer: Medicare HMO | Attending: Physical Medicine & Rehabilitation

## 2017-06-07 ENCOUNTER — Ambulatory Visit (HOSPITAL_BASED_OUTPATIENT_CLINIC_OR_DEPARTMENT_OTHER): Payer: Medicare HMO | Admitting: Physical Medicine & Rehabilitation

## 2017-06-07 VITALS — BP 152/89 | HR 83

## 2017-06-07 DIAGNOSIS — I69351 Hemiplegia and hemiparesis following cerebral infarction affecting right dominant side: Secondary | ICD-10-CM | POA: Diagnosis not present

## 2017-06-07 DIAGNOSIS — Z7982 Long term (current) use of aspirin: Secondary | ICD-10-CM | POA: Insufficient documentation

## 2017-06-07 DIAGNOSIS — G8110 Spastic hemiplegia affecting unspecified side: Secondary | ICD-10-CM | POA: Diagnosis not present

## 2017-06-07 DIAGNOSIS — M25511 Pain in right shoulder: Secondary | ICD-10-CM | POA: Diagnosis not present

## 2017-06-07 NOTE — Progress Notes (Signed)
Subjective:    Patient ID: [name], male    DOB: 06/07/1955, 62 y.o.   MRN: [id number]  HPI  Pain Inventory Average Pain 2 Pain Right Now 0 My pain is intermittent  In the last 24 hours, has pain interfered with the following? General activity 1 Relation with others 1 Enjoyment of life 1 What TIME of day is your pain at its worst? night Sleep (in general) Fair  Pain is worse with: . Pain improves with: medication Relief from Meds: 7  Mobility walk without assistance ability to climb steps?  yes do you drive?  yes  Function disabled: date disabled 2015 I need assistance with the following:  meal prep  Neuro/Psych No problems in this area  Prior Studies Any changes since last visit?  no  Physicians involved in your care Any changes since last visit?  no   Family History  Problem Relation Age of Onset  . Hypertension Mother   . Hernia Sister   . Prostate cancer Neg Hx   . Bladder Cancer Neg Hx   . Kidney cancer Neg Hx    Social History   Socioeconomic History  . Marital status: Married    Spouse name: None  . Number of children: None  . Years of education: None  . Highest education level: None  Social Needs  . Financial resource strain: None  . Food insecurity - worry: None  . Food insecurity - inability: None  . Transportation needs - medical: None  . Transportation needs - non-medical: None  Occupational History  . None  Tobacco Use  . Smoking status: Never Smoker  . Smokeless tobacco: Never Used  Substance and Sexual Activity  . Alcohol use: No    Alcohol/week: 0.0 oz  . Drug use: Yes    Types: Marijuana    Comment: > 20 YEARS AGO  . Sexual activity: No  Other Topics Concern  . None  Social History Narrative  . None   Past Surgical History:  Procedure Laterality Date  . INGUINAL HERNIA REPAIR Bilateral 06/25/2016   Procedure: HERNIA REPAIR INGUINAL ADULT BILATERAL;  Surgeon: Kieth BrightlySeeplaputhur G Sankar, MD;  Location: [facility] ORS;   Service: General;  Laterality: Bilateral;  . KNEE ARTHROSCOPY Right 1970's   Past Medical History:  Diagnosis Date  . Bowel obstruction (HCC)   . Chronic kidney disease   . Hyperlipidemia   . Hypertension   . Inguinal hernia   . Pink eye 06/21/2016  . Sepsis (HCC)   . Stroke (HCC) 12/24/2013   right side weakness   There were no vitals taken for this visit.  Opioid Risk Score:   Fall Risk Score:  `1  Depression screen PHQ 2/9  [facility] 03/09/2017 11/11/2016 10/25/2016 05/20/2016 04/19/2016 01/12/2016 11/18/2015  Decreased Interest 0 0 0 0 0 0 0  Down, Depressed, Hopeless 0 0 0 0 0 0 0  PHQ - 2 Score 0 0 0 0 0 0 0  Altered sleeping - - - - - - -  Tired, decreased energy - - - - - - -  Change in appetite - - - - - - -  Feeling bad or failure about yourself  - - - - - - -  Trouble concentrating - - - - - - -  Moving slowly or fidgety/restless - - - - - - -  Suicidal thoughts - - - - - - -  PHQ-9 Score - - - - - - -  Review of Systems  Constitutional: Negative.   HENT: Negative.   Eyes: Negative.   Respiratory: Negative.   Cardiovascular: Negative.   Gastrointestinal: Negative.   Endocrine: Negative.   Genitourinary: Negative.   Musculoskeletal: Negative.   Skin: Negative.   Allergic/Immunologic: Negative.   Neurological: Negative.   Hematological: Negative.   Psychiatric/Behavioral: Negative.   All other systems reviewed and are negative.      Objective:   Physical Exam        Assessment & Plan:  Dysport Injection for spasticity using needle EMG guidance  Dilution: 200 Units/ml Indication: Severe spasticity which interferes with ADL,mobility and/or  hygiene and is unresponsive to medication management and other conservative care Informed consent was obtained after describing risks and benefits of the procedure with the patient. This includes bleeding, bruising, infection, excessive weakness, or medication side effects. A REMS form is on  file and signed. Needle:  needle electrode Number of units per muscle Biceps 300 Brachialis, 300 FCR 100 FDS 100 FDP 200 Pronator, 100 Brachial radialis, 100 Pectoralis 300 All injections were done after obtaining appropriate EMG activity and after negative drawback for blood. The patient tolerated the procedure well. Post procedure instructions were given. A followup appointment was made.

## 2017-06-07 NOTE — Patient Instructions (Signed)

## 2017-06-29 ENCOUNTER — Encounter: Payer: Self-pay | Admitting: Family Medicine

## 2017-06-30 ENCOUNTER — Encounter: Payer: Self-pay | Admitting: Emergency Medicine

## 2017-06-30 ENCOUNTER — Emergency Department
Admission: EM | Admit: 2017-06-30 | Discharge: 2017-06-30 | Disposition: A | Discharge status: Home / Self Care | Payer: Medicare HMO | Attending: Emergency Medicine | Admitting: Emergency Medicine

## 2017-06-30 ENCOUNTER — Other Ambulatory Visit: Payer: Self-pay

## 2017-06-30 DIAGNOSIS — Z79899 Other long term (current) drug therapy: Secondary | ICD-10-CM | POA: Diagnosis not present

## 2017-06-30 DIAGNOSIS — I10 Essential (primary) hypertension: Secondary | ICD-10-CM | POA: Diagnosis not present

## 2017-06-30 DIAGNOSIS — I129 Hypertensive chronic kidney disease with stage 1 through stage 4 chronic kidney disease, or unspecified chronic kidney disease: Secondary | ICD-10-CM | POA: Diagnosis not present

## 2017-06-30 DIAGNOSIS — Z7982 Long term (current) use of aspirin: Secondary | ICD-10-CM | POA: Diagnosis not present

## 2017-06-30 DIAGNOSIS — E039 Hypothyroidism, unspecified: Secondary | ICD-10-CM | POA: Diagnosis not present

## 2017-06-30 DIAGNOSIS — N189 Chronic kidney disease, unspecified: Secondary | ICD-10-CM | POA: Diagnosis not present

## 2017-06-30 LAB — TROPONIN I

## 2017-06-30 LAB — COMPREHENSIVE METABOLIC PANEL
ALT: 30 U/L (ref 17–63)
AST: 31 U/L (ref 15–41)
Albumin: 4.4 g/dL (ref 3.5–5.0)
Alkaline Phosphatase: 83 U/L (ref 38–126)
Anion gap: 10 (ref 5–15)
BUN: 14 mg/dL (ref 6–20)
CHLORIDE: 103 mmol/L (ref 101–111)
CO2: 23 mmol/L (ref 22–32)
CREATININE: 1.1 mg/dL (ref 0.61–1.24)
Calcium: 9.4 mg/dL (ref 8.9–10.3)
GFR calc Af Amer: >60 mL/min (ref >60)
GFR calc non Af Amer: >60 mL/min (ref >60)
GLUCOSE: 127 mg/dL — AB (ref 65–99)
Potassium: 3.4 mmol/L — ABNORMAL LOW (ref 3.5–5.1)
Sodium: 136 mmol/L (ref 135–145)
Total Bilirubin: 0.7 mg/dL (ref 0.3–1.2)
Total Protein: 8 g/dL (ref 6.5–8.1)

## 2017-06-30 LAB — CBC
HCT: 43.5 % (ref 40.0–52.0)
HEMOGLOBIN: 14.5 g/dL (ref 13.0–18.0)
MCH: 28 pg (ref 26.0–34.0)
MCHC: 33.3 g/dL (ref 32.0–36.0)
MCV: 84.1 fL (ref 80.0–100.0)
PLATELETS: 269 10*3/uL (ref 150–440)
RBC: 5.17 MIL/uL (ref 4.40–5.90)
RDW: 14.6 % — ABNORMAL HIGH (ref 11.5–14.5)
WBC: 6.4 10*3/uL (ref 3.8–10.6)

## 2017-06-30 MED ORDER — LOSARTAN POTASSIUM 50 MG PO TABS
100.0000 mg | ORAL_TABLET | Freq: Every day | ORAL | Status: AC
  Administered 2017-06-30: 100 mg via ORAL

## 2017-06-30 MED ORDER — AMLODIPINE BESYLATE 5 MG PO TABS
10.0000 mg | ORAL_TABLET | Freq: Once | ORAL | Status: AC
Start: 2017-06-30 — End: 2017-06-30
  Administered 2017-06-30: 10 mg via ORAL
  Filled 2017-06-30: qty 2

## 2017-06-30 MED ORDER — LOSARTAN POTASSIUM 50 MG PO TABS
ORAL_TABLET | ORAL | Status: AC
  Administered 2017-06-30: 100 mg via ORAL
  Filled 2017-06-30: qty 2

## 2017-06-30 NOTE — ED Notes (Signed)
Pt reports coming to ER due to HTN. Family at bedside. Dr. Manson PasseyBrown at bedside

## 2017-06-30 NOTE — ED Provider Notes (Signed)
[facility] Emergency Department Provider Note    First MD Initiated Contact with Patient 06/30/17 [phone]     (approximate)  I have reviewed the triage vital signs and the nursing notes.   HISTORY  Chief Complaint Hypertension    HPI [name] is a 62 y.o. male with below list of chronic medical conditions including hypertension and CVA presents to the emergency department with high blood pressure noted at home with blood pressure of 151/92 reported.  Patient denies any chest pain no shortness of breath.  Patient denied any headache nausea vomiting.  Patient denied any acute weakness numbness or visual change.  Patient did admit to feeling as though his heart was racing and being very anxious in regards to his blood pressure been elevated   Past Medical History:  Diagnosis Date  . Bowel obstruction (HCC)   . Chronic kidney disease   . Hyperlipidemia   . Hypertension   . Inguinal hernia   . Pink eye 06/21/2016  . Sepsis (HCC)   . Stroke [facility]) 12/24/2013   right side weakness    Patient Active Problem List   Diagnosis Date Noted  . Hypothyroidism 03/09/2017  . Abdominal aortic atherosclerosis (HCC) 05/20/2016  . Medication monitoring encounter 11/26/2015  . Pre-diabetes 02/18/2015  . Atherosclerosis of both carotid arteries 02/18/2015  . 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 Shoulder subluxation, right 04/23/2014  . Spastic hemiplegia affecting dominant side (HCC) 02/18/2014  . Hypertension goal BP (blood pressure) < 140/90 01/17/2014  . Hyperlipidemia LDL goal <70 01/17/2014    Past Surgical History:  Procedure Laterality Date  . INGUINAL HERNIA REPAIR Bilateral 06/25/2016   Procedure: HERNIA REPAIR INGUINAL ADULT BILATERAL;  Surgeon: Kieth Brightly, MD;  Location: [facility] ORS;  Service: General;  Laterality: Bilateral;  . KNEE  ARTHROSCOPY Right 1970's    Prior to Admission medications   Medication Sig Start Date End Date Taking? Authorizing Provider  amLODipine (NORVASC) 10 MG tablet TAKE 1 TABLET (10 MG TOTAL) BY MOUTH DAILY. Patient taking differently: Take 10 mg by mouth every morning.  06/13/16   Lada, Janit Bern, MD  aspirin EC 325 MG tablet TAKE 1 TABLET (325 MG TOTAL) BY MOUTH DAILY. 03/14/17   Kerman Passey, MD  atorvastatin (LIPITOR) 80 MG tablet Take 1 tablet (80 mg total) by mouth at bedtime. For cholesterol 03/14/17   Lada, Janit Bern, MD  losartan (COZAAR) 100 MG tablet Take 1 tablet (100 mg total) by mouth daily. 04/19/17   Kerman Passey, MD  Multiple Vitamins-Minerals (ONE-A-DAY MENS HEALTH FORMULA) TABS Take 1 tablet by mouth daily.    [provider]  Omega-3 Fatty Acids (FISH OIL) 1000 MG CAPS Take 1,000 mg by mouth daily.    [provider]    Allergies No known drug allergies  Family History  Problem Relation Age of Onset  . Hypertension Mother   . Hernia Sister   . Prostate cancer Neg Hx   . Bladder Cancer Neg Hx   . Kidney cancer Neg Hx     Social History Social History   Tobacco Use  . Smoking status: Never Smoker  . Smokeless tobacco: Never Used  Substance Use Topics  . Alcohol use: No    Alcohol/week: 0.0 oz  . Drug use: Yes    Types: Marijuana    Comment: > 20 YEARS AGO    Review  of Systems Constitutional: No fever/chills Eyes: No visual changes. ENT: No sore throat. Cardiovascular: Denies chest pain. Respiratory: Denies shortness of breath. Gastrointestinal: No abdominal pain.  No nausea, no vomiting.  No diarrhea.  No constipation. Genitourinary: Negative for dysuria. Musculoskeletal: Negative for neck pain.  Negative for back pain. Integumentary: Negative for rash. Neurological: Negative for headaches, focal weakness or numbness.   ____________________________________________   PHYSICAL EXAM:  VITAL SIGNS: ED Triage Vitals  Enc  Vitals Group     BP 06/30/17 0100 (!) 161/88     Pulse Rate 06/30/17 0100 (!) 112     Resp 06/30/17 0100 18     Temp 06/30/17 0100 98 F (36.7 C)     Temp Source 06/30/17 0100 Oral     SpO2 06/30/17 0100 97 %     Weight 06/30/17 0059 103.4 kg (228 lb)     Height 06/30/17 0059 1.803 m (5\' 11" )     Head Circumference --      Peak Flow --      Pain Score --      Pain Loc --      Pain Edu? --      Excl. in GC? --     Constitutional: Alert and oriented. Well appearing and in no acute distress. Eyes: Conjunctivae are normal.  Head: Atraumatic. Mouth/Throat: Mucous membranes are moist.  Oropharynx non-erythematous. Neck: No stridor.   Cardiovascular: Normal rate, regular rhythm. Good peripheral circulation. Grossly normal heart sounds. Respiratory: Normal respiratory effort.  No retractions. Lungs CTAB. Gastrointestinal: Soft and nontender. No distention.  Musculoskeletal: No lower extremity tenderness nor edema. No gross deformities of extremities. Neurologic:  Normal speech and language.  Skin:  Skin is warm, dry and intact. No rash noted. Psychiatric: Mood and affect are normal. Speech and behavior are normal.  ____________________________________________   LABS (all labs ordered are listed, but only abnormal results are displayed)  Labs Reviewed  CBC - Abnormal; Notable for the following components:      Result Value   RDW 14.6 (*)    All other components within normal limits  COMPREHENSIVE METABOLIC PANEL - Abnormal; Notable for the following components:   Potassium 3.4 (*)    Glucose, Bld 127 (*)    All other components within normal limits  TROPONIN I   ____________________________________________  EKG  ED ECG REPORT I, [place] N BROWN, the attending physician, personally viewed and interpreted this ECG.   Date: 06/30/2017  EKG Time: 1:04 AM  Rate: 106  Rhythm: Sinus tachycardia  Axis: Normal  Intervals: Normal  ST&T Change:  None  ___   Procedures   ____________________________________________   INITIAL IMPRESSION / ASSESSMENT AND PLAN / ED COURSE  As part of my medical decision making, I reviewed the following data within the electronic MEDICAL RECORD [id number] year old male presenting with asymptomatic hypertension.  After breathing exercises in the room blood pressure repeated and patient's blood pressure is 146/92.  Spoke with the patient at length regarding following up with his primary care provider for further outpatient blood pressure management.  Patient was given a dose of his morning antihypertensive medications. ____________________________________________  FINAL CLINICAL IMPRESSION(S) / ED DIAGNOSES  Final diagnoses:  Essential hypertension     MEDICATIONS GIVEN DURING THIS VISIT:  Medications - No data to display   ED Discharge Orders    None       Note:  This document was prepared using Dragon voice recognition software and may include unintentional dictation errors.  Darci CurrentBrown,  N, MD 06/30/17 [phone]

## 2017-06-30 NOTE — ED Notes (Signed)
Patient in no acute distress at this time. Patient updated on wait time. 

## 2017-06-30 NOTE — ED Triage Notes (Signed)
Patient ambulatory to triage with steady gait, without difficulty or distress noted; pt reports BP 151/92; st feels heart racing

## 2017-07-05 ENCOUNTER — Ambulatory Visit: Payer: Medicare HMO | Admitting: Family Medicine

## 2017-07-08 ENCOUNTER — Ambulatory Visit: Payer: Medicare HMO | Admitting: Family Medicine

## 2017-07-11 ENCOUNTER — Ambulatory Visit: Payer: Medicare HMO | Admitting: Family Medicine

## 2017-07-19 ENCOUNTER — Encounter: Payer: Self-pay | Admitting: Physical Medicine & Rehabilitation

## 2017-07-19 ENCOUNTER — Encounter: Payer: Medicare HMO | Attending: Physical Medicine & Rehabilitation

## 2017-07-19 ENCOUNTER — Ambulatory Visit: Payer: Medicare HMO | Admitting: Physical Medicine & Rehabilitation

## 2017-07-19 VITALS — BP 145/91 | HR 87 | Resp 14

## 2017-07-19 DIAGNOSIS — Z7982 Long term (current) use of aspirin: Secondary | ICD-10-CM | POA: Insufficient documentation

## 2017-07-19 DIAGNOSIS — G8110 Spastic hemiplegia affecting unspecified side: Secondary | ICD-10-CM | POA: Diagnosis not present

## 2017-07-19 DIAGNOSIS — I69351 Hemiplegia and hemiparesis following cerebral infarction affecting right dominant side: Secondary | ICD-10-CM | POA: Insufficient documentation

## 2017-07-19 DIAGNOSIS — M25511 Pain in right shoulder: Secondary | ICD-10-CM | POA: Insufficient documentation

## 2017-07-19 NOTE — Progress Notes (Signed)
Subjective:    Patient ID: [name], male    DOB: 03/23/1956, 62 y.o.   MRN: [id number]  HPI  No shoulder pain Able to use the right hand to grasp the zipper and pull it up or down.  Also uses his right hand to turn on light switches. He was having difficulty doing this prior to the last injection.  Biceps 300 Brachialis, 300 FCR 100 FDS 100 FDP 200 Pronator, 100 Brachial radialis, 100 Pectoralis 300  Pain Inventory Average Pain 1 Pain Right Now 1 My pain is intermittent  In the last 24 hours, has pain interfered with the following? General activity 0 Relation with others 0 Enjoyment of life 0 What TIME of day is your pain at its worst? night Sleep (in general) Good  Pain is worse with: some activites Pain improves with: therapy/exercise and TENS Relief from Meds: 7  Mobility walk with assistance use a cane how many minutes can you walk? 7 ability to climb steps?  yes do you drive?  yes Do you have any goals in this area?  yes  Function disabled: date disabled . I need assistance with the following:  meal prep Do you have any goals in this area?  yes  Neuro/Psych trouble walking  Prior Studies Any changes since last visit?  no  Physicians involved in your care Any changes since last visit?  no   Family History  Problem Relation Age of Onset  . Hypertension Mother   . Hernia Sister   . Prostate cancer Neg Hx   . Bladder Cancer Neg Hx   . Kidney cancer Neg Hx    Social History   Socioeconomic History  . Marital status: Married    Spouse name: None  . Number of children: None  . Years of education: None  . Highest education level: None  Social Needs  . Financial resource strain: None  . Food insecurity - worry: None  . Food insecurity - inability: None  . Transportation needs - medical: None  . Transportation needs - non-medical: None  Occupational History  . None  Tobacco Use  . Smoking status: Never Smoker  . Smokeless tobacco:  Never Used  Substance and Sexual Activity  . Alcohol use: No    Alcohol/week: 0.0 oz  . Drug use: Yes    Types: Marijuana    Comment: > 20 YEARS AGO  . Sexual activity: No  Other Topics Concern  . None  Social History Narrative  . None   Past Surgical History:  Procedure Laterality Date  . INGUINAL HERNIA REPAIR Bilateral 06/25/2016   Procedure: HERNIA REPAIR INGUINAL ADULT BILATERAL;  Surgeon: Kieth BrightlySeeplaputhur G Sankar, MD;  Location: [facility] ORS;  Service: General;  Laterality: Bilateral;  . KNEE ARTHROSCOPY Right 1970's   Past Medical History:  Diagnosis Date  . Bowel obstruction (HCC)   . Chronic kidney disease   . Hyperlipidemia   . Hypertension   . Inguinal hernia   . Pink eye 06/21/2016  . Sepsis (HCC)   . Stroke (HCC) 12/24/2013   right side weakness   BP (!) 145/91 (BP Location: Left Arm, Patient Position: Sitting, Cuff Size: Normal)   Pulse 87   Resp 14   SpO2 96%   Opioid Risk Score:   Fall Risk Score:  `1  Depression screen PHQ 2/9  [facility] 03/09/2017 11/11/2016 10/25/2016 05/20/2016 04/19/2016 01/12/2016 11/18/2015  Decreased Interest 0 0 0 0 0 0 0  Down, Depressed, Hopeless 0  0 0 0 0 0 0  PHQ - 2 Score 0 0 0 0 0 0 0  Altered sleeping - - - - - - -  Tired, decreased energy - - - - - - -  Change in appetite - - - - - - -  Feeling bad or failure about yourself  - - - - - - -  Trouble concentrating - - - - - - -  Moving slowly or fidgety/restless - - - - - - -  Suicidal thoughts - - - - - - -  PHQ-9 Score - - - - - - -    Review of Systems  Constitutional: Negative.   HENT: Negative.   Eyes: Negative.   Respiratory: Negative.   Cardiovascular: Negative.   Gastrointestinal: Negative.   Endocrine: Negative.   Genitourinary: Negative.   Musculoskeletal: Positive for gait problem.  Skin: Negative.   Allergic/Immunologic: Negative.   Hematological: Negative.   Psychiatric/Behavioral: Negative.        Objective:   Physical  Exam        Assessment & Plan:  1.  Right spastic hemiparesis due to Left Lenticular/corona radiata infarct onset 12/2013. Patient has had good results with tone reduction using Dysport will make some adjustments to reduce elbow flexor tone  Biceps 300 Brachialis, 300 FCR 100 FDS 100 FDP 200 Pronator, 100 Brachial radialis, 200 Pectoralis 200

## 2017-07-19 NOTE — Patient Instructions (Signed)
Will make some adjustments to Dysport.  Reduce pectoralis dose and increase brachioradialis

## 2017-07-28 ENCOUNTER — Telehealth: Payer: Self-pay | Admitting: Family Medicine

## 2017-07-28 ENCOUNTER — Ambulatory Visit (INDEPENDENT_AMBULATORY_CARE_PROVIDER_SITE_OTHER): Payer: Medicare HMO | Admitting: Family Medicine

## 2017-07-28 ENCOUNTER — Encounter: Payer: Self-pay | Admitting: Family Medicine

## 2017-07-28 DIAGNOSIS — I10 Essential (primary) hypertension: Secondary | ICD-10-CM | POA: Diagnosis not present

## 2017-07-28 DIAGNOSIS — I700 Atherosclerosis of aorta: Secondary | ICD-10-CM

## 2017-07-28 DIAGNOSIS — I63512 Cerebral infarction due to unspecified occlusion or stenosis of left middle cerebral artery: Secondary | ICD-10-CM | POA: Diagnosis not present

## 2017-07-28 MED ORDER — AMLODIPINE BESYLATE 10 MG PO TABS: 10.0000 mg | ORAL_TABLET | Freq: Every day | ORAL | 11 refills | Status: DC

## 2017-07-28 MED ORDER — LOSARTAN POTASSIUM 100 MG PO TABS: 100.0000 mg | ORAL_TABLET | Freq: Every day | ORAL | 11 refills | Status: DC

## 2017-07-28 MED ORDER — ROSUVASTATIN CALCIUM 40 MG PO TABS: 40.0000 mg | ORAL_TABLET | Freq: Every day | ORAL | 3 refills | Status: DC

## 2017-07-28 NOTE — Progress Notes (Signed)
BP 136/80 (BP Location: Left Arm, Patient Position: Sitting, Cuff Size: Normal)   Pulse 95   Temp 98.3 F (36.8 C) (Oral)   Wt 232 lb 4.8 oz (105.4 kg)   SpO2 99%   BMI 32.40 kg/m    Subjective:    Patient ID: [name], male    DOB: 05/03/1956, 62 y.o.   MRN: [id number]  HPI: [name] is a 62 y.o. male  Chief Complaint  Patient presents with  . Hypertension    150/90 initial reading at home this morning, 134/88 second reading at home this morning     HPI Patient is here for high blood pressure He went to the ER on 06/30/17 after finding high BP at home, 151/92; heart racing, anxious; did EKG; on ARB and CCB; no changes made to medicine in ER; had him relax and breathe and it came down He was on lisinopril and hctz; 117/74 Stroke doctor, BP there was 145/91 He had renal failure in Dec 2017, kidney shut down and medicines were changed Saw nephrologist in the hospital and outpatient; we reviewed the Feb 2018 note; I don't have the 2nd visit 6 weeks later; his GFR went back up to 90; see him in a year He called the pharmacy about the losaran recall Diet: "I eat good"; yogurt, bananas, strawberries, salads; eating really well; celebrated at his birthday; does not eat pork I asked him about stress; just life stress; doesn't bother him and stays busy around the house; hobbies include music, reading Obesity; gaining weight, had his birthday and other parties; admits he has been eating a fair amount; not eating sweets; not exercising as much, has not been on his bike as much  Depression screen [facility] 2/9 07/28/2017 03/09/2017 11/11/2016 10/25/2016 05/20/2016  Decreased Interest 0 0 0 0 0  Down, Depressed, Hopeless 0 0 0 0 0  PHQ - 2 Score 0 0 0 0 0  Altered sleeping - - - - -  Tired, decreased energy - - - - -  Change in appetite - - - - -  Feeling bad or failure about yourself  - - - - -  Trouble concentrating - - - - -  Moving slowly or fidgety/restless - - - - -  Suicidal  thoughts - - - - -  PHQ-9 Score - - - - -    Relevant past medical, surgical, family and social history reviewed Past Medical History:  Diagnosis Date  . Bowel obstruction (HCC)   . Chronic kidney disease   . Hyperlipidemia   . Hypertension   . Inguinal hernia   . Pink eye 06/21/2016  . Sepsis (HCC)   . Stroke [facility]) 12/24/2013   right side weakness   Past Surgical History:  Procedure Laterality Date  . INGUINAL HERNIA REPAIR Bilateral 06/25/2016   Procedure: HERNIA REPAIR INGUINAL ADULT BILATERAL;  Surgeon: Kieth Brightly, MD;  Location: [facility] ORS;  Service: General;  Laterality: Bilateral;  . KNEE ARTHROSCOPY Right 1970's   Family History  Problem Relation Age of Onset  . Hypertension Mother   . Hernia Sister   . Prostate cancer Neg Hx   . Bladder Cancer Neg Hx   . Kidney cancer Neg Hx    Social History   Tobacco Use  . Smoking status: Never Smoker  . Smokeless tobacco: Never Used  Substance Use Topics  . Alcohol use: No    Alcohol/week: 0.0 oz  . Drug use: No    Comment: used  to use THC in the late 1980s    Interim medical history since last visit reviewed. Allergies and medications reviewed  Review of Systems Per HPI unless specifically indicated above     Objective:    BP 136/80 (BP Location: Left Arm, Patient Position: Sitting, Cuff Size: Normal)   Pulse 95   Temp 98.3 F (36.8 C) (Oral)   Wt 232 lb 4.8 oz (105.4 kg)   SpO2 99%   BMI 32.40 kg/m   Wt Readings from Last 3 Encounters:  07/28/17 232 lb 4.8 oz (105.4 kg)  06/30/17 228 lb (103.4 kg)  03/09/17 225 lb 9.6 oz (102.3 kg)    Today's Vitals   07/28/17 1119 07/28/17 1133  BP: (!) 160/90 136/80  Pulse: 95   Temp: 98.3 F (36.8 C)   TempSrc: Oral   SpO2: 99%   Weight: 232 lb 4.8 oz (105.4 kg)   PainSc: 0-No pain    Physical Exam  Constitutional: He appears well-developed and well-nourished. No distress.  Eyes: No scleral icterus.  Cardiovascular: Normal rate and regular rhythm.   Pulmonary/Chest: Effort normal and breath sounds normal.  Neurological: He is alert. He exhibits abnormal muscle tone. Gait abnormal.  Right sided spastic hemiplegia  Skin: No pallor.  Psychiatric: He has a normal mood and affect. His mood appears not anxious. He does not exhibit a depressed mood.   Results for orders placed or performed during the hospital encounter of 06/30/17  CBC  Result Value Ref Range   WBC 6.4 3.8 - 10.6 K/uL   RBC 5.17 4.40 - 5.90 MIL/uL   Hemoglobin 14.5 13.0 - 18.0 g/dL   HCT 19.143.5 47.840.0 - 29.552.0 %   MCV 84.1 80.0 - 100.0 fL   MCH 28.0 26.0 - 34.0 pg   MCHC 33.3 32.0 - 36.0 g/dL   RDW 62.114.6 (H) 30.811.5 - 65.714.5 %   Platelets 269 150 - 440 K/uL  Comprehensive metabolic panel  Result Value Ref Range   Sodium 136 135 - 145 mmol/L   Potassium 3.4 (L) 3.5 - 5.1 mmol/L   Chloride 103 101 - 111 mmol/L   CO2 23 22 - 32 mmol/L   Glucose, Bld 127 (H) 65 - 99 mg/dL   BUN 14 6 - 20 mg/dL   Creatinine, Ser 8.461.10 0.61 - 1.24 mg/dL   Calcium 9.4 8.9 - 96.210.3 mg/dL   Total Protein 8.0 6.5 - 8.1 g/dL   Albumin 4.4 3.5 - 5.0 g/dL   AST 31 15 - 41 U/L   ALT 30 17 - 63 U/L   Alkaline Phosphatase 83 38 - 126 U/L   Total Bilirubin 0.7 0.3 - 1.2 mg/dL   GFR calc non Af Amer >60 >60 mL/min   GFR calc Af Amer >60 >60 mL/min   Anion gap 10 5 - 15  Troponin I  Result Value Ref Range   Troponin I <0.03 <0.03 ng/mL      Assessment & Plan:   Problem List Items Addressed This Visit      Cardiovascular and Mediastinum   Hypertension goal BP (blood pressure) < 140/90    Will communicate with Dr. Cherylann RatelLateef, nephrologist, about patient's current and recent BP; see if he wishes to do anything differently prior to his appointment; try DASH guidelines; stress reduction, weight loss      Relevant Medications   rosuvastatin (CRESTOR) 40 MG tablet   amLODipine (NORVASC) 10 MG tablet   losartan (COZAAR) 100 MG tablet   Cerebral infarction due  to occlusion of left middle cerebral artery  (HCC)    Seeing Dr. Wynn Banker; botox injections      Relevant Medications   rosuvastatin (CRESTOR) 40 MG tablet   amLODipine (NORVASC) 10 MG tablet   losartan (COZAAR) 100 MG tablet   Abdominal aortic atherosclerosis (HCC)    Reviewed last lipid panel; I'm going to try to push that LDL down even further; already on lipitor 80 mg; will switch to crestor 40 mg at bedtime; weight loss, healthier eating; recheck lipids at next appt in about 6 weeks      Relevant Medications   rosuvastatin (CRESTOR) 40 MG tablet   amLODipine (NORVASC) 10 MG tablet   losartan (COZAAR) 100 MG tablet       Follow up plan: No Follow-up on file.  An after-visit summary was printed and given to the patient at check-out.  Please see the patient instructions which may contain other information and recommendations beyond what is mentioned above in the assessment and plan.  Meds ordered this encounter  Medications  . rosuvastatin (CRESTOR) 40 MG tablet    Sig: Take 1 tablet (40 mg total) by mouth at bedtime. For cholesterol    Dispense:  90 tablet    Refill:  3    Stop atorvastatin  . amLODipine (NORVASC) 10 MG tablet    Sig: Take 1 tablet (10 mg total) by mouth daily.    Dispense:  30 tablet    Refill:  11  . losartan (COZAAR) 100 MG tablet    Sig: Take 1 tablet (100 mg total) by mouth daily.    Dispense:  30 tablet    Refill:  11    No orders of the defined types were placed in this encounter.

## 2017-07-28 NOTE — Assessment & Plan Note (Signed)
Will communicate with Dr. Cherylann RatelLateef, nephrologist, about patient's current and recent BP; see if he wishes to do anything differently prior to his appointment; try DASH guidelines; stress reduction, weight loss

## 2017-07-28 NOTE — Telephone Encounter (Signed)
I left a message regarding patient's blood pressure, please call me back, personal cell number given Re: high pressures, in ER in Feb; appt soon, does he want me to adjust meds or have pt wait to see him

## 2017-07-28 NOTE — Assessment & Plan Note (Signed)
Reviewed last lipid panel; I'm going to try to push that LDL down even further; already on lipitor 80 mg; will switch to crestor 40 mg at bedtime; weight loss, healthier eating; recheck lipids at next appt in about 6 weeks

## 2017-07-28 NOTE — Patient Instructions (Addendum)
Try to follow the DASH guidelines (DASH stands for Dietary Approaches to Stop Hypertension). Try to limit the sodium in your diet to no more than 1,500mg  of sodium per day. Certainly try to not exceed 2,000 mg per day at the very most. Do not add salt when cooking or at the table.  Check the sodium amount on labels when shopping, and choose items lower in sodium when given a choice. Avoid or limit foods that already contain a lot of sodium. Eat a diet rich in fruits and vegetables and whole grains, and try to lose weight if overweight or obese  Check out the information at familydoctor.org entitled "Nutrition for Weight Loss: What You Need to Know about Fad Diets" Try to lose between 1-2 pounds per week by taking in fewer calories and burning off more calories You can succeed by limiting portions, limiting foods dense in calories and fat, becoming more active, and drinking 8 glasses of water a day (64 ounces) Don't skip meals, especially breakfast, as skipping meals may alter your metabolism Do not use over-the-counter weight loss pills or gimmicks that claim rapid weight loss A healthy BMI (or body mass index) is between 18.5 and 24.9 You can calculate your ideal BMI at the NIH website JobEconomics.hu  I'll contact the nephrologist and he or I will get back to you in the next day or two  STOP atorvastatin START rosuvastatin Return for labs with next visit on April 18th  DASH Eating Plan DASH stands for "Dietary Approaches to Stop Hypertension." The DASH eating plan is a healthy eating plan that has been shown to reduce high blood pressure (hypertension). It may also reduce your risk for type 2 diabetes, heart disease, and stroke. The DASH eating plan may also help with weight loss. What are tips for following this plan? General guidelines  Avoid eating more than 2,300 mg (milligrams) of salt (sodium) a day. If you have hypertension, you may  need to reduce your sodium intake to 1,500 mg a day.  Limit alcohol intake to no more than 1 drink a day for nonpregnant women and 2 drinks a day for men. One drink equals 12 oz of beer, 5 oz of wine, or 1 oz of hard liquor.  Work with your health care provider to maintain a healthy body weight or to lose weight. Ask what an ideal weight is for you.  Get at least 30 minutes of exercise that causes your heart to beat faster (aerobic exercise) most days of the week. Activities may include walking, swimming, or biking.  Work with your health care provider or diet and nutrition specialist (dietitian) to adjust your eating plan to your individual calorie needs. Reading food labels  Check food labels for the amount of sodium per serving. Choose foods with less than 5 percent of the Daily Value of sodium. Generally, foods with less than 300 mg of sodium per serving fit into this eating plan.  To find whole grains, look for the word "whole" as the first word in the ingredient list. Shopping  Buy products labeled as "low-sodium" or "no salt added."  Buy fresh foods. Avoid canned foods and premade or frozen meals. Cooking  Avoid adding salt when cooking. Use salt-free seasonings or herbs instead of table salt or sea salt. Check with your health care provider or pharmacist before using salt substitutes.  Do not fry foods. Cook foods using healthy methods such as baking, boiling, grilling, and broiling instead.  Cook with heart-healthy oils,  such as olive, canola, soybean, or sunflower oil. Meal planning   Eat a balanced diet that includes: ? 5 or more servings of fruits and vegetables each day. At each meal, try to fill half of your plate with fruits and vegetables. ? Up to 6-8 servings of whole grains each day. ? Less than 6 oz of lean meat, poultry, or fish each day. A 3-oz serving of meat is about the same size as a deck of cards. One egg equals 1 oz. ? 2 servings of low-fat dairy each  day. ? A serving of nuts, seeds, or beans 5 times each week. ? Heart-healthy fats. Healthy fats called Omega-3 fatty acids are found in foods such as flaxseeds and coldwater fish, like sardines, salmon, and mackerel.  Limit how much you eat of the following: ? Canned or prepackaged foods. ? Food that is high in trans fat, such as fried foods. ? Food that is high in saturated fat, such as fatty meat. ? Sweets, desserts, sugary drinks, and other foods with added sugar. ? Full-fat dairy products.  Do not salt foods before eating.  Try to eat at least 2 vegetarian meals each week.  Eat more home-cooked food and less restaurant, buffet, and fast food.  When eating at a restaurant, ask that your food be prepared with less salt or no salt, if possible. What foods are recommended? The items listed may not be a complete list. Talk with your dietitian about what dietary choices are best for you. Grains Whole-grain or whole-wheat bread. Whole-grain or whole-wheat pasta. Brown rice. [place]. Bulgur. Whole-grain and low-sodium cereals. Pita bread. Low-fat, low-sodium crackers. Whole-wheat flour tortillas. Vegetables Fresh or frozen vegetables (raw, steamed, roasted, or grilled). Low-sodium or reduced-sodium tomato and vegetable juice. Low-sodium or reduced-sodium tomato sauce and tomato paste. Low-sodium or reduced-sodium canned vegetables. Fruits All fresh, dried, or frozen fruit. Canned fruit in natural juice (without added sugar). Meat and other protein foods Skinless chicken or [place]. Ground chicken or [place]. Pork with fat trimmed off. Fish and seafood. Egg whites. Dried beans, peas, or lentils. Unsalted nuts, nut butters, and seeds. Unsalted canned beans. Lean cuts of beef with fat trimmed off. Low-sodium, lean deli meat. Dairy Low-fat (1%) or fat-free (skim) milk. Fat-free, low-fat, or reduced-fat cheeses. Nonfat, low-sodium ricotta or cottage cheese. Low-fat or nonfat yogurt.  Low-fat, low-sodium cheese. Fats and oils Soft margarine without trans fats. Vegetable oil. Low-fat, reduced-fat, or light mayonnaise and salad dressings (reduced-sodium). Canola, safflower, olive, soybean, and sunflower oils. Avocado. Seasoning and other foods Herbs. Spices. Seasoning mixes without salt. Unsalted popcorn and pretzels. Fat-free sweets. What foods are not recommended? The items listed may not be a complete list. Talk with your dietitian about what dietary choices are best for you. Grains Baked goods made with fat, such as croissants, muffins, or some breads. Dry pasta or rice meal packs. Vegetables Creamed or fried vegetables. Vegetables in a cheese sauce. Regular canned vegetables (not low-sodium or reduced-sodium). Regular canned tomato sauce and paste (not low-sodium or reduced-sodium). Regular tomato and vegetable juice (not low-sodium or reduced-sodium). Angie Fava. Olives. Fruits Canned fruit in a light or heavy syrup. Fried fruit. Fruit in cream or butter sauce. Meat and other protein foods Fatty cuts of meat. Ribs. Fried meat. Berniece Salines. Sausage. Bologna and other processed lunch meats. Salami. Fatback. Hotdogs. Bratwurst. Salted nuts and seeds. Canned beans with added salt. Canned or smoked fish. Whole eggs or egg yolks. Chicken or [place] with skin. Dairy Whole or 2% milk,  cream, and half-and-half. Whole or full-fat cream cheese. Whole-fat or sweetened yogurt. Full-fat cheese. Nondairy creamers. Whipped toppings. Processed cheese and cheese spreads. Fats and oils Butter. Stick margarine. Lard. Shortening. Ghee. Bacon fat. Tropical oils, such as coconut, palm kernel, or palm oil. Seasoning and other foods Salted popcorn and pretzels. Onion salt, garlic salt, seasoned salt, table salt, and sea salt. Worcestershire sauce. Tartar sauce. Barbecue sauce. Teriyaki sauce. Soy sauce, including reduced-sodium. Steak sauce. Canned and packaged gravies. Fish sauce. Oyster sauce. Cocktail  sauce. Horseradish that you find on the shelf. Ketchup. Mustard. Meat flavorings and tenderizers. Bouillon cubes. Hot sauce and Tabasco sauce. Premade or packaged marinades. Premade or packaged taco seasonings. Relishes. Regular salad dressings. Where to find more information:  National Heart, Lung, and Blood Institute: PopSteam.is  American Heart Association: www.heart.org Summary  The DASH eating plan is a healthy eating plan that has been shown to reduce high blood pressure (hypertension). It may also reduce your risk for type 2 diabetes, heart disease, and stroke.  With the DASH eating plan, you should limit salt (sodium) intake to 2,300 mg a day. If you have hypertension, you may need to reduce your sodium intake to 1,500 mg a day.  When on the DASH eating plan, aim to eat more fresh fruits and vegetables, whole grains, lean proteins, low-fat dairy, and heart-healthy fats.  Work with your health care provider or diet and nutrition specialist (dietitian) to adjust your eating plan to your individual calorie needs. This information is not intended to replace advice given to you by your health care provider. Make sure you discuss any questions you have with your health care provider. Document Released: 04/29/2011 Document Revised: 05/03/2016 Document Reviewed: 05/03/2016 Elsevier Interactive Patient Education  [employer].

## 2017-07-28 NOTE — Assessment & Plan Note (Signed)
Seeing Dr. Wynn BankerKirsteins; botox injections

## 2017-07-29 MED ORDER — CLONIDINE HCL 0.1 MG PO TABS: 0.1000 mg | ORAL_TABLET | Freq: Three times a day (TID) | ORAL | 0 refills | Status: DC | PRN

## 2017-07-29 NOTE — Telephone Encounter (Signed)
Called the office of Dr.Lateef, spoke with his nurse Victorino DikeJennifer who states that he is not in office however she will put in a message to the provider. He will not be back in clinic until Monday. Pt has upcoming appt with their office on 08/08/2017. Victorino DikeJennifer states she will call back when provider responds.

## 2017-07-29 NOTE — Telephone Encounter (Signed)
Pt notes that BPs at home have been fine, running 130s over 80.

## 2017-07-29 NOTE — Telephone Encounter (Signed)
Called pt informed him of information below. Pt gave verbal understanding, advised pt on checking BP throughout the weekend.

## 2017-07-29 NOTE — Telephone Encounter (Signed)
So I haven't heard back from the kidney doctor Can you please contact their office and leave another message; patient is waiting to know what we are going to do for his blood pressure

## 2017-07-29 NOTE — Telephone Encounter (Signed)
Please call patient Let him know that we are still waiting to hear from nephrologist In the meantime, continue the amlodipine at 10 mg daily and the losartan at 100 mg daily I'll send in a prescription for him to use IF his top number goes over 150 (clonidine); he can use that every 8-12 hours; does not last long, will just prescribe a few Hopefully, having that available will keep him out of the emergency department

## 2017-08-03 ENCOUNTER — Telehealth: Payer: Self-pay | Admitting: Family Medicine

## 2017-08-03 NOTE — Telephone Encounter (Signed)
I left message with nephrologist's office, letting them know pressures 130s/80 range He has PRN clonidine to use appt on 3/18 with them I believe

## 2017-08-08 ENCOUNTER — Other Ambulatory Visit: Payer: Self-pay | Admitting: Family Medicine

## 2017-08-08 DIAGNOSIS — I10 Essential (primary) hypertension: Secondary | ICD-10-CM | POA: Diagnosis not present

## 2017-08-08 DIAGNOSIS — N179 Acute kidney failure, unspecified: Secondary | ICD-10-CM | POA: Diagnosis not present

## 2017-08-08 NOTE — Progress Notes (Signed)
Coreg started by kidney doctor

## 2017-08-25 ENCOUNTER — Other Ambulatory Visit: Payer: Self-pay

## 2017-08-25 ENCOUNTER — Other Ambulatory Visit: Payer: Self-pay | Admitting: Family Medicine

## 2017-08-25 MED ORDER — CLONIDINE HCL 0.1 MG PO TABS: 0.1000 mg | ORAL_TABLET | Freq: Three times a day (TID) | ORAL | 0 refills | Status: DC | PRN

## 2017-08-25 MED ORDER — ROSUVASTATIN CALCIUM 40 MG PO TABS: 40.0000 mg | ORAL_TABLET | Freq: Every day | ORAL | 3 refills | Status: DC

## 2017-08-25 MED ORDER — LOSARTAN POTASSIUM 100 MG PO TABS: 100.0000 mg | ORAL_TABLET | Freq: Every day | ORAL | 3 refills | Status: DC

## 2017-08-25 NOTE — Telephone Encounter (Signed)
The clonidine is strictly PRN, so limited Rx The statin was already sent in March Rx for 90 days sent to local pharm for ARB

## 2017-08-25 NOTE — Telephone Encounter (Signed)
[facility]. Called pt and he states that he wants to start using [facility] rather than [facility].

## 2017-08-25 NOTE — Progress Notes (Signed)
Pharmacy was not updated in last phone note; only text but local pharm was checked off in Rx section After closing note, I saw the text New [facility]xs sent

## 2017-08-30 ENCOUNTER — Ambulatory Visit: Payer: Medicare HMO | Admitting: Physical Medicine & Rehabilitation

## 2017-08-30 ENCOUNTER — Encounter: Payer: Medicare HMO | Attending: Physical Medicine & Rehabilitation

## 2017-08-30 ENCOUNTER — Encounter: Payer: Self-pay | Admitting: Physical Medicine & Rehabilitation

## 2017-08-30 VITALS — BP 142/87 | HR 63 | Ht 71.0 in | Wt 225.0 lb

## 2017-08-30 DIAGNOSIS — Z7982 Long term (current) use of aspirin: Secondary | ICD-10-CM | POA: Insufficient documentation

## 2017-08-30 DIAGNOSIS — M25511 Pain in right shoulder: Secondary | ICD-10-CM | POA: Diagnosis not present

## 2017-08-30 DIAGNOSIS — G8110 Spastic hemiplegia affecting unspecified side: Secondary | ICD-10-CM

## 2017-08-30 DIAGNOSIS — I69351 Hemiplegia and hemiparesis following cerebral infarction affecting right dominant side: Secondary | ICD-10-CM | POA: Insufficient documentation

## 2017-08-30 NOTE — Patient Instructions (Signed)

## 2017-08-30 NOTE — Progress Notes (Signed)
Dysport Injection for spasticity using needle EMG guidance  Dilution: 200 Units/ml Indication: Severe spasticity which interferes with ADL,mobility and/or  hygiene and is unresponsive to medication management and other conservative care Informed consent was obtained after describing risks and benefits of the procedure with the patient. This includes bleeding, bruising, infection, excessive weakness, or medication side effects. A REMS form is on file and signed. Needle:  needle electrode Number of units per muscle Biceps125 Brachialis 125 FCR25 FCU0 FDS0 FDP25 PT50 Brachioradialis 50  All injections were done after obtaining appropriate EMG activity and after negative drawback for blood. The patient tolerated the procedure well. Post procedure instructions were given. A followup appointment was made.

## 2017-09-08 ENCOUNTER — Ambulatory Visit (INDEPENDENT_AMBULATORY_CARE_PROVIDER_SITE_OTHER): Payer: Medicare HMO | Admitting: Family Medicine

## 2017-09-08 ENCOUNTER — Encounter: Payer: Self-pay | Admitting: Family Medicine

## 2017-09-08 VITALS — BP 128/82 | HR 79 | Temp 98.1°F | Resp 14 | Ht 71.0 in | Wt 230.7 lb

## 2017-09-08 DIAGNOSIS — E785 Hyperlipidemia, unspecified: Secondary | ICD-10-CM

## 2017-09-08 DIAGNOSIS — I10 Essential (primary) hypertension: Secondary | ICD-10-CM | POA: Diagnosis not present

## 2017-09-08 DIAGNOSIS — R7303 Prediabetes: Secondary | ICD-10-CM | POA: Diagnosis not present

## 2017-09-08 DIAGNOSIS — E039 Hypothyroidism, unspecified: Secondary | ICD-10-CM | POA: Diagnosis not present

## 2017-09-08 DIAGNOSIS — I700 Atherosclerosis of aorta: Secondary | ICD-10-CM | POA: Diagnosis not present

## 2017-09-08 DIAGNOSIS — G8110 Spastic hemiplegia affecting unspecified side: Secondary | ICD-10-CM | POA: Diagnosis not present

## 2017-09-08 NOTE — Assessment & Plan Note (Signed)
Stable; glad the botox is helping

## 2017-09-08 NOTE — Assessment & Plan Note (Signed)
Goal LDL is less than 70; continue crestor

## 2017-09-08 NOTE — Assessment & Plan Note (Signed)
Controlled, try DASH guidelines 

## 2017-09-08 NOTE — Assessment & Plan Note (Signed)
Check A1c in 4 weeks

## 2017-09-08 NOTE — Progress Notes (Signed)
BP 128/82   Pulse 79   Temp 98.1 F (36.7 C) (Oral)   Resp 14   Ht 5\' 11"  (1.803 m)   Wt 230 lb 11.2 oz (104.6 kg)   SpO2 94%   BMI 32.18 kg/m    Subjective:    Patient ID: [name], male    DOB: 1956/02/16, 62 y.o.   MRN: [id number]  HPI: [name] is a 62 y.o. male  Chief Complaint  Patient presents with  . Follow-up    HPI Here for f/u Had botox injection last Tuesday; he has been working his arm the last two nights pretty hard; he can do a whole more with his arm this week; he concentrated on the elbow and fingers; doing that every 90 days; he can feel it start to tighten up after 80 days  He has hypertension; seeing Dr. Cherylann Ratel for chronic kidney disease; avoiding NSAIDs completely; tylenol is okay he knows  Prediabetes, A1c was 5.9  LDL went from 98 to 91; taking the crestor; no muscle aches; no fatty meats, rarely eats any breakfast food; small steak once in a while; not any fried eggs  Lab Results  Component Value Date   TSH 2.66 03/09/2017    Depression screen [facility] 2/9 09/08/2017 07/28/2017 03/09/2017 11/11/2016 10/25/2016  Decreased Interest 0 0 0 0 0  Down, Depressed, Hopeless 0 0 0 0 0  PHQ - 2 Score 0 0 0 0 0  Altered sleeping - - - - -  Tired, decreased energy - - - - -  Change in appetite - - - - -  Feeling bad or failure about yourself  - - - - -  Trouble concentrating - - - - -  Moving slowly or fidgety/restless - - - - -  Suicidal thoughts - - - - -  PHQ-9 Score - - - - -    Relevant past medical, surgical, family and social history reviewed Past Medical History:  Diagnosis Date  . Bowel obstruction (HCC)   . Chronic kidney disease   . Hyperlipidemia   . Hypertension   . Inguinal hernia   . Pink eye 06/21/2016  . Sepsis (HCC)   . Stroke [facility]) 12/24/2013   right side weakness   Past Surgical History:  Procedure Laterality Date  . INGUINAL HERNIA REPAIR Bilateral 06/25/2016   Procedure: HERNIA REPAIR INGUINAL ADULT BILATERAL;   Surgeon: Kieth Brightly, MD;  Location: [facility] ORS;  Service: General;  Laterality: Bilateral;  . KNEE ARTHROSCOPY Right 1970's   Family History  Problem Relation Age of Onset  . Hypertension Mother   . Hernia Sister   . Prostate cancer Neg Hx   . Bladder Cancer Neg Hx   . Kidney cancer Neg Hx    Social History   Tobacco Use  . Smoking status: Never Smoker  . Smokeless tobacco: Never Used  Substance Use Topics  . Alcohol use: No    Alcohol/week: 0.0 oz  . Drug use: No    Comment: used to use THC in the late 1980s    Interim medical history since last visit reviewed. Allergies and medications reviewed  Review of Systems  Cardiovascular: Negative for chest pain.  Gastrointestinal: Negative for abdominal pain.   Per HPI unless specifically indicated above     Objective:    BP 128/82   Pulse 79   Temp 98.1 F (36.7 C) (Oral)   Resp 14   Ht 5\' 11"  (1.803 m)   Wt  230 lb 11.2 oz (104.6 kg)   SpO2 94%   BMI 32.18 kg/m   Wt Readings from Last 3 Encounters:  09/08/17 230 lb 11.2 oz (104.6 kg)  08/30/17 225 lb (102.1 kg)  07/28/17 232 lb 4.8 oz (105.4 kg)    Physical Exam  Constitutional: He appears well-developed and well-nourished. No distress.  obese  HENT:  Head: Normocephalic and atraumatic.  Eyes: EOM are normal. No scleral icterus.  Neck: No thyromegaly present.  Cardiovascular: Normal rate and regular rhythm.  Pulmonary/Chest: Effort normal and breath sounds normal.  Abdominal: Soft. Bowel sounds are normal. He exhibits no distension.  Musculoskeletal: He exhibits no edema.  Neurological: Gait abnormal.  Contracture of RUE  Skin: Skin is warm and dry. No pallor.  Psychiatric: He has a normal mood and affect. His behavior is normal. Judgment and thought content normal. His mood appears not anxious. He does not exhibit a depressed mood.      Assessment & Plan:   Problem List Items Addressed This Visit      Cardiovascular and Mediastinum    Hypertension goal BP (blood pressure) < 140/90    Controlled, try DASH guidelines      Abdominal aortic atherosclerosis (HCC) - Primary    Goal LDL is less than 70; continue crestor      Relevant Orders   Lipid panel     Endocrine   Hypothyroidism    Stable, last tsh utd        Nervous and Auditory   Spastic hemiplegia affecting dominant side (HCC)    Stable; glad the botox is helping        Other   Pre-diabetes    Check A1c in 4 weeks      Relevant Orders   Hemoglobin A1c   Hyperlipidemia LDL goal <70    Check lipids in 4 weeks; limiting saturated fats          Follow up plan: Return in about 6 months (around 03/10/2018) for follow-up visit with Dr. Sherie DonLada.  An after-visit summary was printed and given to the patient at check-out.  Please see the patient instructions which may contain other information and recommendations beyond what is mentioned above in the assessment and plan.  No orders of the defined types were placed in this encounter.   Orders Placed This Encounter  Procedures  . Lipid panel  . Hemoglobin A1c

## 2017-09-08 NOTE — Assessment & Plan Note (Signed)
Check lipids in 4 weeks; limiting saturated fats

## 2017-09-08 NOTE — Patient Instructions (Signed)
Return in another 4 weeks for labs

## 2017-09-08 NOTE — Assessment & Plan Note (Signed)
Stable, last tsh utd

## 2017-10-11 ENCOUNTER — Encounter: Payer: Self-pay | Admitting: Physical Medicine & Rehabilitation

## 2017-10-11 ENCOUNTER — Ambulatory Visit: Payer: Medicare HMO | Admitting: Physical Medicine & Rehabilitation

## 2017-10-11 ENCOUNTER — Encounter: Payer: Medicare HMO | Attending: Physical Medicine & Rehabilitation

## 2017-10-11 VITALS — BP 146/78 | HR 70 | Ht 71.0 in | Wt 230.0 lb

## 2017-10-11 DIAGNOSIS — I69351 Hemiplegia and hemiparesis following cerebral infarction affecting right dominant side: Secondary | ICD-10-CM | POA: Insufficient documentation

## 2017-10-11 DIAGNOSIS — M25511 Pain in right shoulder: Secondary | ICD-10-CM | POA: Insufficient documentation

## 2017-10-11 DIAGNOSIS — Z7982 Long term (current) use of aspirin: Secondary | ICD-10-CM | POA: Insufficient documentation

## 2017-10-11 DIAGNOSIS — G8110 Spastic hemiplegia affecting unspecified side: Secondary | ICD-10-CM | POA: Diagnosis not present

## 2017-10-11 NOTE — Progress Notes (Signed)
Subjective:    Patient ID: [name], male    DOB: May 06, 1956, 62 y.o.   MRN: [id number]  HPI 08/30/17 Botulinum toxin injection for chronic right spastic hemiplegia which is only partially responsive to physical therapy and oral medications.  Biceps125 Brachialis 125 FCR25 FCU0 FDS0  FDP25 PT50 Brachioradialis 50 Pain Inventory Average Pain 1 Pain Right Now 1 My pain is intermittent  In the last 24 hours, has pain interfered with the following? General activity 0 Relation with others 0 Enjoyment of life 0 What TIME of day is your pain at its worst? na Sleep (in general) na  Pain is worse with: na Pain improves with: therapy/exercise, TENS and injections Relief from Meds: 9  Mobility walk without assistance ability to climb steps?  yes do you drive?  yes  Function disabled: date disabled .  Neuro/Psych No problems in this area  Prior Studies Any changes since last visit?  no  Physicians involved in your care Any changes since last visit?  no   Family History  Problem Relation Age of Onset  . Hypertension Mother   . Hernia Sister   . Prostate cancer Neg Hx   . Bladder Cancer Neg Hx   . Kidney cancer Neg Hx    Social History   Socioeconomic History  . Marital status: Married    Spouse name: Not on file  . Number of children: 0  . Years of education: Not on file  . Highest education level: Not on file  Occupational History  . Not on file  Social Needs  . Financial resource strain: Not on file  . Food insecurity:    Worry: Not on file    Inability: Not on file  . Transportation needs:    Medical: Not on file    Non-medical: Not on file  Tobacco Use  . Smoking status: Never Smoker  . Smokeless tobacco: Never Used  Substance and Sexual Activity  . Alcohol use: No    Alcohol/week: 0.0 oz  . Drug use: No    Comment: used to use THC in the late 1980s  . Sexual activity: Yes  Lifestyle  . Physical activity:    Days per week: 3 days   Minutes per session: 30 min  . Stress: Not at all  Relationships  . Social connections:    Talks on phone: Not on file    Gets together: Not on file    Attends religious service: Not on file    Active member of club or organization: Not on file    Attends meetings of clubs or organizations: Not on file    Relationship status: Not on file  Other Topics Concern  . Not on file  Social History Narrative  . Not on file   Past Surgical History:  Procedure Laterality Date  . INGUINAL HERNIA REPAIR Bilateral 06/25/2016   Procedure: HERNIA REPAIR INGUINAL ADULT BILATERAL;  Surgeon: Kieth Brightly, MD;  Location: [facility] ORS;  Service: General;  Laterality: Bilateral;  . KNEE ARTHROSCOPY Right 1970's   Past Medical History:  Diagnosis Date  . Bowel obstruction (HCC)   . Chronic kidney disease   . Hyperlipidemia   . Hypertension   . Inguinal hernia   . Pink eye 06/21/2016  . Sepsis (HCC)   . Stroke (HCC) 12/24/2013   right side weakness   BP (!) 146/78   Pulse 70   Ht  (1.803 m)   Wt 230 lb (104.3 kg)  SpO2 98%   BMI 32.08 kg/m   Opioid Risk Score:   Fall Risk Score:  `1  Depression screen PHQ 2/9  Depression screen [facility] 2/9 09/08/2017 07/28/2017 03/09/2017 11/11/2016 10/25/2016 05/20/2016 04/19/2016  Decreased Interest 0 0 0 0 0 0 0  Down, Depressed, Hopeless 0 0 0 0 0 0 0  PHQ - 2 Score 0 0 0 0 0 0 0  Altered sleeping - - - - - - -  Tired, decreased energy - - - - - - -  Change in appetite - - - - - - -  Feeling bad or failure about yourself  - - - - - - -  Trouble concentrating - - - - - - -  Moving slowly or fidgety/restless - - - - - - -  Suicidal thoughts - - - - - - -  PHQ-9 Score - - - - - - -     Review of Systems  Constitutional: Negative.   HENT: Negative.   Eyes: Negative.   Respiratory: Negative.   Cardiovascular: Negative.   Gastrointestinal: Negative.   Endocrine: Negative.   Genitourinary: Negative.   Musculoskeletal: Negative.   Skin:  Negative.   Allergic/Immunologic: Negative.   Neurological: Negative.   Hematological: Negative.   Psychiatric/Behavioral: Negative.   All other systems reviewed and are negative.      Objective:   Physical Exam  Constitutional: He is oriented to person, place, and time. He appears well-developed and well-nourished. No distress.  HENT:  Head: Normocephalic and atraumatic.  Eyes: Pupils are equal, round, and reactive to light. EOM are normal.  Neurological: He is alert and oriented to person, place, and time. He exhibits abnormal muscle tone. Coordination abnormal.  Skin: Skin is warm and dry. He is not diaphoretic.  Psychiatric: He has a normal mood and affect.  Nursing note and vitals reviewed.   3- Right Delt, 2- bicep trace tricep 0 finger flexors and extensors       Assessment & Plan:  1.  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  We discussed that we are at a maximum dose of botulinum toxin but can alter muscle group selection as needed

## 2017-11-25 ENCOUNTER — Ambulatory Visit: Payer: Medicare HMO | Admitting: Physical Medicine & Rehabilitation

## 2017-12-01 ENCOUNTER — Encounter: Payer: Medicare HMO | Attending: Physical Medicine & Rehabilitation

## 2017-12-01 ENCOUNTER — Ambulatory Visit: Payer: Medicare HMO | Admitting: Physical Medicine & Rehabilitation

## 2017-12-01 ENCOUNTER — Encounter: Payer: Self-pay | Admitting: Physical Medicine & Rehabilitation

## 2017-12-01 VITALS — BP 117/74 | HR 63 | Ht 71.0 in | Wt 232.0 lb

## 2017-12-01 DIAGNOSIS — G8110 Spastic hemiplegia affecting unspecified side: Secondary | ICD-10-CM

## 2017-12-01 DIAGNOSIS — I69351 Hemiplegia and hemiparesis following cerebral infarction affecting right dominant side: Secondary | ICD-10-CM | POA: Insufficient documentation

## 2017-12-01 DIAGNOSIS — Z7982 Long term (current) use of aspirin: Secondary | ICD-10-CM | POA: Diagnosis not present

## 2017-12-01 DIAGNOSIS — M25511 Pain in right shoulder: Secondary | ICD-10-CM | POA: Insufficient documentation

## 2017-12-01 NOTE — Patient Instructions (Signed)

## 2017-12-01 NOTE — Progress Notes (Signed)
Botox Injection for spasticity using needle EMG guidance  Dilution: 50 Units/ml Indication: Severe spasticity which interferes with ADL,mobility and/or  hygiene and is unresponsive to medication management and other conservative care Informed consent was obtained after describing risks and benefits of the procedure with the patient. This includes bleeding, bruising, infection, excessive weakness, or medication side effects. A REMS form is on file and signed. Needle: 27g 1" needle electrode Number of units per muscle Biceps125 Brachialis 125 FCR25 FCU0 FDS0 FDP25 PT50 Brachioradialis 50 All injections were done after obtaining appropriate EMG activity and after negative drawback for blood. The patient tolerated the procedure well. Post procedure instructions were given. A followup appointment was made.

## 2017-12-04 ENCOUNTER — Encounter: Payer: Self-pay | Admitting: Physical Medicine & Rehabilitation

## 2017-12-05 ENCOUNTER — Encounter: Payer: Self-pay | Admitting: Family Medicine

## 2017-12-05 DIAGNOSIS — N5089 Other specified disorders of the male genital organs: Secondary | ICD-10-CM

## 2017-12-05 NOTE — Telephone Encounter (Signed)
Patient requesting letter of jury duty excuse.

## 2017-12-06 ENCOUNTER — Other Ambulatory Visit: Payer: Self-pay | Admitting: Family Medicine

## 2017-12-06 ENCOUNTER — Ambulatory Visit
Admission: RE | Admit: 2017-12-06 | Discharge: 2017-12-06 | Disposition: A | Discharge status: Home / Self Care | Payer: Medicare HMO | Source: Ambulatory Visit | Attending: Family Medicine | Admitting: Family Medicine

## 2017-12-06 DIAGNOSIS — N5089 Other specified disorders of the male genital organs: Secondary | ICD-10-CM | POA: Diagnosis not present

## 2017-12-06 DIAGNOSIS — N433 Hydrocele, unspecified: Secondary | ICD-10-CM | POA: Insufficient documentation

## 2017-12-07 ENCOUNTER — Encounter: Payer: Self-pay | Admitting: Urology

## 2017-12-07 ENCOUNTER — Encounter: Payer: Self-pay | Admitting: Radiology

## 2017-12-07 ENCOUNTER — Other Ambulatory Visit: Payer: Self-pay | Admitting: Radiology

## 2017-12-07 ENCOUNTER — Ambulatory Visit: Payer: Medicare HMO | Admitting: Urology

## 2017-12-07 VITALS — BP 134/78 | HR 74 | Ht 71.0 in | Wt 232.0 lb

## 2017-12-07 DIAGNOSIS — N433 Hydrocele, unspecified: Secondary | ICD-10-CM

## 2017-12-07 NOTE — Progress Notes (Signed)
12/07/2017 9:31 AM   [name] 11/22/1955 [id number]  Referring provider: Kerman PasseyLada, Melinda P, MD [address] [place], [place] [id number]  Chief Complaint  Patient presents with  . Groin Swelling    HPI: 62 year old male who presents today for further evaluation of left testicular swelling.  He was seen and evaluated at urgent care just yesterday for left-sided hemiscrotal swelling.  Scrotal ultrasound confirmed the presence of a large left hydrocele without evidence of infection or torsion.  He reports that he noticed dramatic increase in his testicular size a few weeks ago.  This is not precipitated by anything.  Is not particular painful just bothersome in size.  It continued to enlarge slowly.  Is stabilized over the past few days.  He is having discomfort from the size of his scrotum with position changes and ambulating.  He otherwise has no testicular pain or discomfort.  He has not tried NSAIDs or Tylenol.  He is wearing a scrotal support which helps the discomfort.  He denies any testicular pain or urinary symptoms.  No dysuria, frequency, urgency, or gross hematuria.  No history of UTIs.  No hisotry of testicular trauma or injury.  He does have a history of bilateral inguinal hernia repair 2018.   PMH: Past Medical History:  Diagnosis Date  . Bowel obstruction (HCC)   . Chronic kidney disease   . Hyperlipidemia   . Hypertension   . Inguinal hernia   . Pink eye 06/21/2016  . Sepsis (HCC)   . Stroke [facility](HCC) 12/24/2013   right side weakness    Surgical History: Past Surgical History:  Procedure Laterality Date  . INGUINAL HERNIA REPAIR Bilateral 06/25/2016   Procedure: HERNIA REPAIR INGUINAL ADULT BILATERAL;  Surgeon: Kieth BrightlySeeplaputhur G Sankar, MD;  Location: [facility] ORS;  Service: General;  Laterality: Bilateral;  . KNEE ARTHROSCOPY Right 1970's    Home Medications:  Allergies as of 12/07/2017   No Known Allergies     Medication List        Accurate as of 12/07/17  9:31 AM. Always use your most recent med list.          amLODipine 10 MG tablet Commonly known as:  NORVASC Take 1 tablet (10 mg total) by mouth daily.   aspirin EC 325 MG tablet TAKE 1 TABLET (325 MG TOTAL) BY MOUTH DAILY.   carvedilol 6.25 MG tablet Commonly known as:  COREG Take 1 tablet (6.25 mg total) by mouth 2 (two) times daily with a meal.   cloNIDine 0.1 MG tablet Commonly known as:  CATAPRES Take 1 tablet (0.1 mg total) by mouth every 8 (eight) hours as needed. (ONLY if needed for high blood pressure)   Fish Oil 1000 MG Caps Take 1,000 mg by mouth daily.   losartan 100 MG tablet Commonly known as:  COZAAR Take 1 tablet (100 mg total) by mouth daily.   ONE-A-DAY MENS HEALTH FORMULA Tabs Take 1 tablet by mouth daily.   rosuvastatin 40 MG tablet Commonly known as:  CRESTOR Take 1 tablet (40 mg total) by mouth at bedtime. For cholesterol       Allergies: No Known Allergies  Family History: Family History  Problem Relation Age of Onset  . Hypertension Mother   . Hernia Sister   . Prostate cancer Neg Hx   . Bladder Cancer Neg Hx   . Kidney cancer Neg Hx     Social History:  reports that he has never smoked. He has never used smokeless  tobacco. He reports that he does not drink alcohol or use drugs.  ROS: UROLOGY Frequent Urination?: No Hard to postpone urination?: No Burning/pain with urination?: No Get up at night to urinate?: No Leakage of urine?: No Urine stream starts and stops?: No Trouble starting stream?: No Do you have to strain to urinate?: No Blood in urine?: No Urinary tract infection?: No Sexually transmitted disease?: No Injury to kidneys or bladder?: No Painful intercourse?: No Weak stream?: No Erection problems?: No Penile pain?: No  Gastrointestinal Nausea?: No Vomiting?: No Indigestion/heartburn?: No Diarrhea?: No Constipation?: No  Constitutional Fever: No Night sweats?: No Weight loss?:  No Fatigue?: No  Skin Skin rash/lesions?: No Itching?: No  Eyes Blurred vision?: No Double vision?: No  Ears/Nose/Throat Sore throat?: No Sinus problems?: No  Hematologic/Lymphatic Swollen glands?: No Easy bruising?: No  Cardiovascular Leg swelling?: No Chest pain?: No  Respiratory Cough?: No Shortness of breath?: No  Endocrine Excessive thirst?: No  Musculoskeletal Back pain?: No Joint pain?: No  Neurological Headaches?: No Dizziness?: No  Psychologic Depression?: No Anxiety?: No  Physical Exam: BP 134/78   Pulse 74   Ht 5\' 11"  (1.803 m)   Wt 232 lb (105.2 kg)   BMI 32.36 kg/m   Constitutional:  Alert and oriented, No acute distress.  Accompanied by wife today. HEENT: [place] AT, moist mucus membranes.  Trachea midline, no masses. Cardiovascular: No clubbing, cyanosis, or edema. Respiratory: Normal respiratory effort, no increased work of breathing. GI: Abdomen is soft, nontender, nondistended, no abdominal masses GU: Normal phallus.  Large softball sized left hydrocele, testicle nonpalpable.  Right testicle normal.  No scrotal skin changes or edema.   Skin: No rashes, bruises or suspicious lesions. Neurologic: Grossly intact, no focal deficits, moving all 4 extremities.  Right sided weakness noted with abnormal gait. Psychiatric: Normal mood and affect.  Laboratory Data: Lab Results  Component Value Date   WBC 6.4 06/30/2017   HGB 14.5 06/30/2017   HCT 43.5 06/30/2017   MCV 84.1 06/30/2017   PLT 269 06/30/2017    Lab Results  Component Value Date   CREATININE 1.10 06/30/2017    Lab Results  Component Value Date   HGBA1C 5.9 (H) 03/09/2017    Urinalysis Component Value Ref Range Performed At Pathologist Signature  Color Yellow Yellow [facility]   Clarity Clear Clear [facility]   Specific Gravity 1.020 1.000 - 1.030 [facility]   pH, Urine 5.5 5.0 - 8.0 [facility]    Protein, Urinalysis Negative Negative, Trace mg/dL [facility]   Glucose, Urinalysis Negative Negative mg/dL [facility]   Ketones, Urinalysis Negative Negative mg/dL [facility]   Blood, Urinalysis Negative Negative [facility]   Nitrite, Urinalysis Negative Negative [facility]   Leukocyte Esterase, Urinalysis Negative Negative [facility]   White Blood Cells, Urinalysis 0-3 None Seen, 0-3 /hpf [facility]   Red Blood Cells, Urinalysis None Seen None Seen, 0-3 /hpf [facility]   Bacteria, Urinalysis None Seen None Seen /hpf [facility]   Squamous Epithelial Cells, Urinalysis Rare Rare, Few, None Seen /hpf [facility]      Pertinent Imaging: CLINICAL DATA:  Scrotal swelling  EXAM: SCROTAL ULTRASOUND  DOPPLER ULTRASOUND OF THE TESTICLES  TECHNIQUE: Complete ultrasound examination of the  testicles, epididymis, and other scrotal structures was performed. Color and spectral Doppler ultrasound were also utilized to evaluate blood flow to the testicles.  COMPARISON:  None.  FINDINGS: Right testicle  Measurements: 5.4 x 2.6 x 2.3 cm. No mass or microlithiasis visualized.  Left testicle  Measurements: 5.4 x 2.9 x 2.6 cm. No mass or microlithiasis visualized.  Right epididymis: Normal in size and appearance. No inflammatory focus evident.  Left epididymis: Epididymal structures are compressed by large hydrocele. No mass or inflammatory focus evident.  Hydrocele: There is a large left-sided hydrocele. There is a minimal right-sided hydrocele.  Varicocele:  None visualized.  Pulsed Doppler interrogation of both testes demonstrates normal low resistance arterial and venous waveforms bilaterally.  No scrotal wall thickening or abscess.  IMPRESSION: 1. Large hydrocele on the left which compresses  the left epididymis and to a lesser extent the left testis.  2. No intratesticular or extratesticular mass on either side. No testicular torsion on either side. No orchitis or epididymitis on either side.  3.  Small right hydrocele.   Electronically Signed   By: Bretta Bang III M.D.   On: 12/06/2017 10:35   Assessment & Plan:    1. Left hydrocele Symptomatic soft the size hydrocele Given that it occurred rather acutely, it is likely inflammatory nature We discussed that these often resolve but given the very large size, is unlikely to resolve completely We discussed alternatives for treatment today including percutaneous drainage versus hydrocelectomy versus observation  He is interested in hydrocelectomy.  We discussed the risk of surgery including risk of bleeding, infection, damage to surrounding structures, hematoma, discomfort, and recurrence at length today.  All questions were answered.  He does have a personal history of stroke and is on a full aspirin.  Ideally, this could be held perioperatively to reduce the risk of hematoma.  We will check with Dr. Sherie Don to see if this is possible and the risks associated with this.  He went down to 81 mg at the time of his hernia repair.  Vanna Scotland, MD  [facility] [address], [address] [place], [place] [address] [phone]  I spent 25 min with this patient of which greater than 50% was spent in counseling and coordination of care with the patient.

## 2017-12-14 ENCOUNTER — Encounter
Admission: RE | Admit: 2017-12-14 | Discharge: 2017-12-14 | Disposition: A | Discharge status: Home / Self Care | Payer: Medicare HMO | Source: Ambulatory Visit | Attending: Urology | Admitting: Urology

## 2017-12-14 ENCOUNTER — Telehealth: Payer: Self-pay | Admitting: Family Medicine

## 2017-12-14 ENCOUNTER — Other Ambulatory Visit: Payer: Self-pay

## 2017-12-14 DIAGNOSIS — Z01810 Encounter for preprocedural cardiovascular examination: Secondary | ICD-10-CM | POA: Insufficient documentation

## 2017-12-14 DIAGNOSIS — Z01812 Encounter for preprocedural laboratory examination: Secondary | ICD-10-CM | POA: Insufficient documentation

## 2017-12-14 DIAGNOSIS — I10 Essential (primary) hypertension: Secondary | ICD-10-CM | POA: Insufficient documentation

## 2017-12-14 HISTORY — DX: Personal history of other drug therapy: Z92.29

## 2017-12-14 LAB — BASIC METABOLIC PANEL
ANION GAP: 6 (ref 5–15)
BUN: 12 mg/dL (ref 8–23)
CHLORIDE: 103 mmol/L (ref 98–111)
CO2: 30 mmol/L (ref 22–32)
Calcium: 9.2 mg/dL (ref 8.9–10.3)
Creatinine, Ser: 1.05 mg/dL (ref 0.61–1.24)
GFR calc non Af Amer: >60 mL/min (ref >60)
Glucose, Bld: 106 mg/dL — ABNORMAL HIGH (ref 70–99)
POTASSIUM: 4.4 mmol/L (ref 3.5–5.1)
SODIUM: 139 mmol/L (ref 135–145)

## 2017-12-14 LAB — CBC
HCT: 41.2 % (ref 40.0–52.0)
HEMOGLOBIN: 13.9 g/dL (ref 13.0–18.0)
MCH: 28.6 pg (ref 26.0–34.0)
MCHC: 33.7 g/dL (ref 32.0–36.0)
MCV: 84.9 fL (ref 80.0–100.0)
Platelets: 231 10*3/uL (ref 150–440)
RBC: 4.85 MIL/uL (ref 4.40–5.90)
RDW: 13.8 % (ref 11.5–14.5)
WBC: 4.4 10*3/uL (ref 3.8–10.6)

## 2017-12-14 NOTE — Pre-Procedure Instructions (Addendum)
Patient states that when he had his previous surgery, he stopped the Aspirin 325mg  and began Aspirin 81 mg (as per the surgeon).He recently saw his cardiologist, medical practitioner and surgeon and stated that he told each of them about this medication regime change. Addendum: patient just called preadmit testing stating that Dr. Assunta GamblesBrandons' office called him and wants him to stop his aspirin. They suggested that he contact Dr. Wyn Quakerew to determine if this is okay. Patient does not have a regular visit with Dr. Wyn Quakerew. I suggested he follow Dr. Assunta GamblesBrandons' orders and hold his aspirin until after the surgery.  He agrees and understands.

## 2017-12-14 NOTE — Telephone Encounter (Signed)
Left message with Urology

## 2017-12-14 NOTE — Patient Instructions (Addendum)
Your procedure is scheduled on: Wednesday, December 21, 2017  Report to THE SAME DAY SURGERY AREA ON THE SECOND FLOOR OF THE          MEDICAL MALL. DO NOT STOP ON THE FIRST FLOOR TO REGISTER  To find out your arrival time please call [phone] between 1PM - 3PM on Tuesday, December 20, 2017  Remember: Instructions that are not followed completely may result in serious medical risk,  up to and including death, or upon the discretion of your surgeon and anesthesiologist your  surgery may need to be rescheduled.     _X__ 1. Do not eat food after midnight the night before your procedure.                 No gum chewing or hard candies. NOTHING IN YOUR MOUTH AFTER MIDNIGHT.                  You may drink clear liquids up to 2 hours before you are scheduled to arrive for your surgery-                    DO not drink clear liquids within 2 hours of the start of your surgery.                  Clear Liquids include:  water, apple juice without pulp, clear carbohydrate                 drink such as Clearfast of Gatorade, Black Coffee or Tea (Do not add                 anything to coffee or tea).  __X__2.  On the morning of surgery brush your teeth with toothpaste and water,                  You may rinse your mouth with mouthwash if you wish.                      Do not swallow any toothpaste of mouthwash.     _X__ 3.  No Alcohol for 24 hours before or after surgery.   _X__ 4.  Do Not Smoke or use e-cigarettes For 24 Hours Prior to Your Surgery.                 Do not use any chewable tobacco products for at least 6 hours prior to                 surgery.  ____  5.  Bring all medications with you on the day of surgery if instructed.   ____  6.  Notify your doctor if there is any change in your medical condition      (cold, fever, infections).     Do not wear jewelry, make-up, hairpins, clips or nail polish. Do not wear lotions, powders, or perfumes. You may wear  deodorant. Do not shave 48 hours prior to surgery. Men may shave face and neck. Do not bring valuables to the hospital.    [facility] is not responsible for any belongings or valuables.  Contacts, dentures or bridgework may not be worn into surgery. Leave your suitcase in the car. After surgery it may be brought to your room. For patients admitted to the hospital, discharge time is determined by your treatment team.   Patients discharged the day of surgery will not be allowed to drive home.  Please read over the following fact sheets that you were given:   PREPARING FOR SURGERY   ____ Take these medicines the morning of surgery with A SIP OF WATER:    1. AMLODIPINE  2. CARVEDILOL  3. CLONIDINE, IF YOU NEED IT  4. EYE DROPS  5.  6.  ____ Fleet Enema (as directed)   _X___ Use ANTIBACTERIAL Soap as directed  _X___ Stop ASPIRIN 325MG  AS OF TODAY. BEGIN ASPIRIN 81 MG AND TAKE UP UNTIL SURGERY BUT NOT ON THE MORNING OF SURGERY                THIS INCLUDES EXCEDRIN / BC POWDER / GOODIES POWDERS   _X___ Stop Anti-inflammatories AS OF TODAY.              THIS INCLUDES IBUPROFEN / MOTRIN / ADVIL / ALEVE                    YOU MAY TAKE TYLENOL ANYTIME UP UNTIL SURGERY   ____ Stop supplements ON THE DAY OF surgery.           YOU MAY TAKE YOUR MULTIVITAMINS BUT DO NOT TAKE ON THE MORNING OF SURGERY           STOP THE FISH OIL TODAY  CONTINUE TAKING LOSARTAN AS USUAL, BUT DO NOT TAKE ON THE MORNING OF SURGERY   ____ Bring C-Pap to the hospital.   WEAR LOOSE AND COMFORTABLE PANTS/CLOTHES ON DAY OF SURGERY  HAVE TIGHT UNDERWEAR AND A JOCK STRAP WITH YOU TO WEAR HOME.  HAVE STOOL SOFTENERS FOR USE AT HOME IF YOU ARE TAKING PAIN MEDICATION.

## 2017-12-14 NOTE — Telephone Encounter (Signed)
[facility] office has called and is asking that the patient get an ok to stop his 325 aspirin 7 days before his surgery which would be stopping it today. His surgery is on 12-21-17 and they also per the Dr that is doing the surgery would prefer the patient have no anti coagulants before surgery if that is ok with the doctor. The patient has an appt with Dr Sherie DonLada on 12-19-17 for surgical clearance.

## 2017-12-14 NOTE — Telephone Encounter (Signed)
Have them contact his NEUROLOGIST for those instructions Thank you

## 2017-12-14 NOTE — Pre-Procedure Instructions (Signed)
AS REQUESTED BY DR Randa NgoPISCITELLO, EKG/REQUEST FOR MEDICAL CLEARANCE CALLED AND FAXED TO DR LADA. SPOKE WITH NORMA. ALSO CALLED AND FAXED INFO TO AMY. SHE STATES DR Apolinar JunesBRANDON IS CONTACTING DR DEW TO GET OK TO STOP ASPIRIN PREOP. STATES DR LADA PREFERRED DR Wyn QuakerEW TO MAKE DECISION

## 2017-12-15 ENCOUNTER — Encounter: Payer: Self-pay | Admitting: Family Medicine

## 2017-12-15 ENCOUNTER — Telehealth: Payer: Self-pay | Admitting: Radiology

## 2017-12-15 ENCOUNTER — Telehealth: Payer: Self-pay | Admitting: Family Medicine

## 2017-12-15 ENCOUNTER — Ambulatory Visit (INDEPENDENT_AMBULATORY_CARE_PROVIDER_SITE_OTHER): Payer: Medicare HMO | Admitting: Vascular Surgery

## 2017-12-15 DIAGNOSIS — R9431 Abnormal electrocardiogram [ECG] [EKG]: Secondary | ICD-10-CM | POA: Insufficient documentation

## 2017-12-15 NOTE — Telephone Encounter (Signed)
Notified patient of need for clearance prior to surgery to stop ASA 325mg  x 7 days prior to surgery & also to evaluate abnormal EKG. For this reason surgery has been postponed until 01/18/2018. Questions answered. Patient voices understanding.

## 2017-12-15 NOTE — Telephone Encounter (Signed)
Thank you for getting me the EKG He has first degree AV block, bradycardia, and T wave inversion I've put in a referral to cardiology for cardiac clearance Please let patient know and urologist's office know

## 2017-12-15 NOTE — Telephone Encounter (Signed)
I do not see it in my box I asked staff about it She is getting it from up front

## 2017-12-15 NOTE — Telephone Encounter (Signed)
Pt is concerned can you please call to discuss, he states just had EKG in ER and Dr. York SpanielSaid it was fine?

## 2017-12-15 NOTE — Addendum Note (Signed)
Addended by: LADA, Janit BernMELINDA P on: 12/15/2017 02:43 PM   Modules accepted: Orders

## 2017-12-15 NOTE — Telephone Encounter (Signed)
Copied from CRM [id number]. Topic: Quick Communication - See Telephone Encounter >> Dec 15, 2017 12:06 PM Terisa Starraylor, Brittany L wrote: CRM for notification. See Telephone encounter for: 12/15/17.  [facility] that pre-admission testing sent over his EKG to Dr Sherie DonLada on 7/24. Amy would like to follow up on that.  Call back @ [phone] choice option 3

## 2017-12-15 NOTE — Assessment & Plan Note (Signed)
Abnormal EKG; refer to cardiologist for cardiac clearance for surgery

## 2017-12-15 NOTE — Telephone Encounter (Signed)
See other note; I spoke with patient There have been changes in the EKG since that one was done in Feb

## 2017-12-15 NOTE — Telephone Encounter (Signed)
I called him back Explained that this EKG is significantly different from the previous EKG done in February; discussed findings, need for cardiac clerance "I'm not trying to be a jerk" he says I appreciate his frustration with co-pays He is going to get clearance about the aspirin He is going to see cardiologist Don't take any more clonidine, I told him; he did take one yesterday morning because it was a little bit high before the EKG The kidney doctor has him on carvedilol, he checked the bottle to get the milligrams and it's 6.25 mg BID Reviewed stroke discharge papers from January 17, 2014; 325 mg aspirin for secondary stroke prevention in the discharge summary

## 2017-12-16 ENCOUNTER — Encounter (INDEPENDENT_AMBULATORY_CARE_PROVIDER_SITE_OTHER): Payer: Self-pay | Admitting: Vascular Surgery

## 2017-12-16 ENCOUNTER — Ambulatory Visit (INDEPENDENT_AMBULATORY_CARE_PROVIDER_SITE_OTHER): Payer: Medicare HMO | Admitting: Vascular Surgery

## 2017-12-16 VITALS — BP 160/89 | HR 67 | Resp 16 | Ht 71.0 in | Wt 230.0 lb

## 2017-12-16 DIAGNOSIS — E785 Hyperlipidemia, unspecified: Secondary | ICD-10-CM | POA: Diagnosis not present

## 2017-12-16 DIAGNOSIS — I700 Atherosclerosis of aorta: Secondary | ICD-10-CM | POA: Diagnosis not present

## 2017-12-16 DIAGNOSIS — I6523 Occlusion and stenosis of bilateral carotid arteries: Secondary | ICD-10-CM | POA: Diagnosis not present

## 2017-12-16 DIAGNOSIS — I10 Essential (primary) hypertension: Secondary | ICD-10-CM | POA: Diagnosis not present

## 2017-12-16 NOTE — Progress Notes (Signed)
MRN : [id number]  [name] is a 62 y.o. (02-14-1956) male who presents with chief complaint of No chief complaint on file. Marland Kitchen  History of Present Illness: Patient returns today in follow up of his mild carotid disease and aortic atherosclerosis.  The visit is made at the request of an upcoming surgery for a hydrocele.  Request was made that he come off of his aspirin.  He has had no further neurologic events since his previous stroke.  His carotid disease was quite mild when checked last year and is scheduled to be rechecked again next year.  Similar for his aortic atherosclerosis.  This can be checked next year as well.  No lifestyle limiting claudication or lower extremity ischemic symptoms.  Current Outpatient Medications  Medication Sig Dispense Refill  . acetaminophen (TYLENOL) 500 MG tablet Take 1,000 mg by mouth daily as needed for moderate pain.    Marland Kitchen amLODipine (NORVASC) 10 MG tablet Take 1 tablet (10 mg total) by mouth daily. 30 tablet 11  . aspirin EC 325 MG tablet TAKE 1 TABLET (325 MG TOTAL) BY MOUTH DAILY. 90 tablet 3  . carvedilol (COREG) 6.25 MG tablet Take 1 tablet (6.25 mg total) by mouth 2 (two) times daily with a meal.    . losartan (COZAAR) 100 MG tablet Take 1 tablet (100 mg total) by mouth daily. 90 tablet 3  . Multiple Vitamins-Minerals (ONE-A-DAY MENS HEALTH FORMULA) TABS Take 1 tablet by mouth daily.    . Naphazoline-Hypromellose (TGT LUBRICANT REDNESS RELIEVER OP) Place 1 drop into both eyes daily as needed (redness/ dryness).    . Omega-3 Fatty Acids (FISH OIL) 1000 MG CAPS Take 1,000 mg by mouth daily.    . rosuvastatin (CRESTOR) 40 MG tablet Take 1 tablet (40 mg total) by mouth at bedtime. For cholesterol 90 tablet 3   No current facility-administered medications for this visit.     Past Medical History:  Diagnosis Date  . Bowel obstruction ([place]) 2016   during septic event. no further a problem once sepsis resolved  . Chronic kidney disease 2016   happened after stroke. dr. Holley Raring follows him once a year  . Hyperlipidemia   . Hypertension   . Inguinal hernia   . Pink eye 06/21/2016  . S/P Botox injection    every 3 months into right arm to loosen up muscles  . Sepsis ([place]) 2016   happened after stroke. unsure when,but kidney disease started. followed by dr. Holley Raring  . Stroke [facility]) 12/24/2013   right side weakness    Past Surgical History:  Procedure Laterality Date  . INGUINAL HERNIA REPAIR Bilateral 06/25/2016   Procedure: HERNIA REPAIR INGUINAL ADULT BILATERAL;  Surgeon: Christene Lye, MD;  Location: [facility] ORS;  Service: General;  Laterality: Bilateral;  . KNEE ARTHROSCOPY Right 1970's    Social History Social History   Tobacco Use  . Smoking status: Never Smoker  . Smokeless tobacco: Never Used  Substance Use Topics  . Alcohol use: No    Alcohol/week: 0.0 oz  . Drug use: No    Comment: used to use THC in the late 1980s     Family History Family History  Problem Relation Age of Onset  . Hypertension Mother   . Hernia Sister   . Prostate cancer Neg Hx   . Bladder Cancer Neg Hx   . Kidney cancer Neg Hx      No Known Allergies  REVIEW OF SYSTEMS (Negative unless checked)  Constitutional: '[]'$ Weight  loss  '[]'$ Fever  '[]'$ Chills Cardiac: '[]'$ Chest pain   '[]'$ Chest pressure   '[]'$ Palpitations   '[]'$ Shortness of breath when laying flat   '[]'$ Shortness of breath at rest   '[]'$ Shortness of breath with exertion. Vascular:  '[]'$ Pain in legs with walking   '[]'$ Pain in legs at rest   '[]'$ Pain in legs when laying flat   '[]'$ Claudication   '[]'$ Pain in feet when walking  '[]'$ Pain in feet at rest  '[]'$ Pain in feet when laying flat   '[]'$ History of DVT   '[]'$ Phlebitis   '[x]'$ Swelling in legs   '[]'$ Varicose veins   '[]'$ Non-healing ulcers Pulmonary:   '[]'$ Uses home oxygen   '[]'$ Productive cough   '[]'$ Hemoptysis   '[]'$ Wheeze  '[]'$ COPD   '[]'$ Asthma Neurologic:  '[]'$ Dizziness  '[]'$ Blackouts   '[]'$ Seizures   '[x]'$ History of stroke   '[]'$ History of TIA  '[]'$ Aphasia   '[]'$ Temporary blindness    '[]'$ Dysphagia   '[x]'$ Weakness or numbness in arms   '[]'$ Weakness or numbness in legs Musculoskeletal:  '[]'$ Arthritis   '[]'$ Joint swelling   '[]'$ Joint pain   '[]'$ Low back pain Hematologic:  '[]'$ Easy bruising  '[]'$ Easy bleeding   '[]'$ Hypercoagulable state   '[]'$ Anemic  '[]'$ Hepatitis Gastrointestinal:  '[]'$ Blood in stool   '[]'$ Vomiting blood  '[]'$ Gastroesophageal reflux/heartburn   '[]'$ Abdominal pain Genitourinary:  '[]'$ Chronic kidney disease   '[]'$ Difficult urination  '[]'$ Frequent urination  '[]'$ Burning with urination   '[]'$ Hematuria Skin:  '[]'$ Rashes   '[]'$ Ulcers   '[]'$ Wounds Psychological:  '[]'$ History of anxiety   '[]'$  History of major depression.     Physical Examination  There were no vitals taken for this visit. Gen:  WD/WN, NAD Head: [place]/AT, No temporalis wasting. Ear/Nose/Throat: Hearing grossly intact, nares w/o erythema or drainage Eyes: Conjunctiva clear. Sclera non-icteric Neck: Supple.  Trachea midline Pulmonary:  Good air movement, no use of accessory muscles.  Cardiac: RRR, no JVD Vascular:  Vessel Right Left  Radial Palpable Palpable                          PT Palpable Palpable  DP Palpable Palpable   Gastrointestinal: soft, non-tender/non-distended.  Musculoskeletal: right sided weakness. Worse in the arm than the leg.  Neurologic: Sensation grossly intact in extremities. Right sided weakness  Speech is fluent.  Psychiatric: Judgment intact, Mood & affect appropriate for pt's clinical situation. Dermatologic: No rashes or ulcers noted.  No cellulitis or open wounds.       Labs Recent Results (from the past 2160 hour(s))  CBC     Status: None   Collection Time: 12/14/17 11:33 AM  Result Value Ref Range   WBC 4.4 3.8 - 10.6 K/uL   RBC 4.85 4.40 - 5.90 MIL/uL   Hemoglobin 13.9 13.0 - 18.0 g/dL   HCT 41.2 40.0 - 52.0 %   MCV 84.9 80.0 - 100.0 fL   MCH 28.6 26.0 - 34.0 pg   MCHC 33.7 32.0 - 36.0 g/dL   RDW 13.8 11.5 - 14.5 %   Platelets 231 150 - 440 K/uL    Comment: Performed at [facility], [address] [place], [place] [address]  Basic metabolic panel     Status: Abnormal   Collection Time: 12/14/17 11:33 AM  Result Value Ref Range   Sodium 139 135 - 145 mmol/L   Potassium 4.4 3.5 - 5.1 mmol/L   Chloride 103 98 - 111 mmol/L   CO2 30 22 - 32 mmol/L   Glucose, Bld 106 (H) 70 - 99 mg/dL   BUN 12 8 -  23 mg/dL   Creatinine, Ser 1.05 0.61 - 1.24 mg/dL   Calcium 9.2 8.9 - 10.3 mg/dL   GFR calc non Af Amer >60 >60 mL/min   GFR calc Af Amer >60 >60 mL/min    Comment: (NOTE) The eGFR has been calculated using the CKD EPI equation. This calculation has not been validated in all clinical situations. eGFR's persistently <60 mL/min signify possible Chronic Kidney Disease.    Anion gap 6 5 - 15    Comment: Performed at [facility], [address] [place],  [address]    Radiology US Scrotum W/doppler  Result Date: 12/06/2017 CLINICAL DATA:  Scrotal swelling EXAM: SCROTAL ULTRASOUND DOPPLER ULTRASOUND OF THE TESTICLES TECHNIQUE: Complete ultrasound examination of the testicles, epididymis, and other scrotal structures was performed. Color and spectral Doppler ultrasound were also utilized to evaluate blood flow to the testicles. COMPARISON:  None. FINDINGS: Right testicle Measurements: 5.4 x 2.6 x 2.3 cm. No mass or microlithiasis visualized. Left testicle Measurements: 5.4 x 2.9 x 2.6 cm. No mass or microlithiasis visualized. Right epididymis: Normal in size and appearance. No inflammatory focus evident. Left epididymis: Epididymal structures are compressed by large hydrocele. No mass or inflammatory focus evident. Hydrocele: There is a large left-sided hydrocele. There is a minimal right-sided hydrocele. Varicocele:  None visualized. Pulsed Doppler interrogation of both testes demonstrates normal low resistance arterial and venous waveforms bilaterally. No scrotal wall thickening or abscess. IMPRESSION: 1. Large hydrocele on the left which compresses the left  epididymis and to a lesser extent the left testis. 2. No intratesticular or extratesticular mass on either side. No testicular torsion on either side. No orchitis or epididymitis on either side. 3.  Small right hydrocele. Electronically Signed   By: Lowella Grip III M.D.   On: 12/06/2017 10:35    Assessment/Plan Hypertension goal BP (blood pressure) < 140/90 blood pressure control important in reducing the progression of atherosclerotic disease. On appropriate oral medications.   Atherosclerosis of both carotid arteries Going back about 3 years, he has had some carotid atherosclerosis without hemodynamically significant stenosis. At this point, I think we can stretch out his follow-ups and checked this every other year. He should continue his aspirin and statin agent but stopping his aspirin for a week for his upcoming surgery is fine with minimal risk involved.  Hyperlipidemia LDL goal <70 lipid control important in reducing the progression of atherosclerotic disease. Continue statin therapy   Abdominal aortic atherosclerosis ([place]) ABIs at his convenience.  OK to stop Aspirin     Leotis Pain, MD  12/16/2017 1:13 PM    This note was created with Dragon medical transcription system.  Any errors from dictation are purely unintentional

## 2017-12-19 ENCOUNTER — Ambulatory Visit (INDEPENDENT_AMBULATORY_CARE_PROVIDER_SITE_OTHER): Payer: Medicare HMO | Admitting: Family Medicine

## 2017-12-19 ENCOUNTER — Encounter: Payer: Self-pay | Admitting: Family Medicine

## 2017-12-19 DIAGNOSIS — I700 Atherosclerosis of aorta: Secondary | ICD-10-CM | POA: Diagnosis not present

## 2017-12-19 DIAGNOSIS — R7303 Prediabetes: Secondary | ICD-10-CM | POA: Diagnosis not present

## 2017-12-19 DIAGNOSIS — Z01818 Encounter for other preprocedural examination: Secondary | ICD-10-CM

## 2017-12-19 NOTE — Patient Instructions (Addendum)
If your blood pressure is over 140 on top, let me know Try to follow the DASH guidelines (DASH stands for Dietary Approaches to Stop Hypertension). Try to limit the sodium in your diet to no more than 1,500mg  of sodium per day. Certainly try to not exceed 2,000 mg per day at the very most. Do not add salt when cooking or at the table.  Check the sodium amount on labels when shopping, and choose items lower in sodium when given a choice. Avoid or limit foods that already contain a lot of sodium. Eat a diet rich in fruits and vegetables and whole grains, and try to lose weight if overweight or obese   DASH Eating Plan DASH stands for "Dietary Approaches to Stop Hypertension." The DASH eating plan is a healthy eating plan that has been shown to reduce high blood pressure (hypertension). It may also reduce your risk for type 2 diabetes, heart disease, and stroke. The DASH eating plan may also help with weight loss. What are tips for following this plan? General guidelines  Avoid eating more than 2,300 mg (milligrams) of salt (sodium) a day. If you have hypertension, you may need to reduce your sodium intake to 1,500 mg a day.  Limit alcohol intake to no more than 1 drink a day for nonpregnant women and 2 drinks a day for men. One drink equals 12 oz of beer, 5 oz of wine, or 1 oz of hard liquor.  Work with your health care provider to maintain a healthy body weight or to lose weight. Ask what an ideal weight is for you.  Get at least 30 minutes of exercise that causes your heart to beat faster (aerobic exercise) most days of the week. Activities may include walking, swimming, or biking.  Work with your health care provider or diet and nutrition specialist (dietitian) to adjust your eating plan to your individual calorie needs. Reading food labels  Check food labels for the amount of sodium per serving. Choose foods with less than 5 percent of the Daily Value of sodium. Generally, foods with less than  300 mg of sodium per serving fit into this eating plan.  To find whole grains, look for the word "whole" as the first word in the ingredient list. Shopping  Buy products labeled as "low-sodium" or "no salt added."  Buy fresh foods. Avoid canned foods and premade or frozen meals. Cooking  Avoid adding salt when cooking. Use salt-free seasonings or herbs instead of table salt or sea salt. Check with your health care provider or pharmacist before using salt substitutes.  Do not fry foods. Cook foods using healthy methods such as baking, boiling, grilling, and broiling instead.  Cook with heart-healthy oils, such as olive, canola, soybean, or sunflower oil. Meal planning   Eat a balanced diet that includes: ? 5 or more servings of fruits and vegetables each day. At each meal, try to fill half of your plate with fruits and vegetables. ? Up to 6-8 servings of whole grains each day. ? Less than 6 oz of lean meat, poultry, or fish each day. A 3-oz serving of meat is about the same size as a deck of cards. One egg equals 1 oz. ? 2 servings of low-fat dairy each day. ? A serving of nuts, seeds, or beans 5 times each week. ? Heart-healthy fats. Healthy fats called Omega-3 fatty acids are found in foods such as flaxseeds and coldwater fish, like sardines, salmon, and mackerel.  Limit how much  you eat of the following: ? Canned or prepackaged foods. ? Food that is high in trans fat, such as fried foods. ? Food that is high in saturated fat, such as fatty meat. ? Sweets, desserts, sugary drinks, and other foods with added sugar. ? Full-fat dairy products.  Do not salt foods before eating.  Try to eat at least 2 vegetarian meals each week.  Eat more home-cooked food and less restaurant, buffet, and fast food.  When eating at a restaurant, ask that your food be prepared with less salt or no salt, if possible. What foods are recommended? The items listed may not be a complete list. Talk with  your dietitian about what dietary choices are best for you. Grains Whole-grain or whole-wheat bread. Whole-grain or whole-wheat pasta. Brown rice. Modena Morrow. Bulgur. Whole-grain and low-sodium cereals. Pita bread. Low-fat, low-sodium crackers. Whole-wheat flour tortillas. Vegetables Fresh or frozen vegetables (raw, steamed, roasted, or grilled). Low-sodium or reduced-sodium tomato and vegetable juice. Low-sodium or reduced-sodium tomato sauce and tomato paste. Low-sodium or reduced-sodium canned vegetables. Fruits All fresh, dried, or frozen fruit. Canned fruit in natural juice (without added sugar). Meat and other protein foods Skinless chicken or [place]. Ground chicken or [place]. Pork with fat trimmed off. Fish and seafood. Egg whites. Dried beans, peas, or lentils. Unsalted nuts, nut butters, and seeds. Unsalted canned beans. Lean cuts of beef with fat trimmed off. Low-sodium, lean deli meat. Dairy Low-fat (1%) or fat-free (skim) milk. Fat-free, low-fat, or reduced-fat cheeses. Nonfat, low-sodium ricotta or cottage cheese. Low-fat or nonfat yogurt. Low-fat, low-sodium cheese. Fats and oils Soft margarine without trans fats. Vegetable oil. Low-fat, reduced-fat, or light mayonnaise and salad dressings (reduced-sodium). Canola, safflower, olive, soybean, and sunflower oils. Avocado. Seasoning and other foods Herbs. Spices. Seasoning mixes without salt. Unsalted popcorn and pretzels. Fat-free sweets. What foods are not recommended? The items listed may not be a complete list. Talk with your dietitian about what dietary choices are best for you. Grains Baked goods made with fat, such as croissants, muffins, or some breads. Dry pasta or rice meal packs. Vegetables Creamed or fried vegetables. Vegetables in a cheese sauce. Regular canned vegetables (not low-sodium or reduced-sodium). Regular canned tomato sauce and paste (not low-sodium or reduced-sodium). Regular tomato and vegetable juice  (not low-sodium or reduced-sodium). Angie Fava. Olives. Fruits Canned fruit in a light or heavy syrup. Fried fruit. Fruit in cream or butter sauce. Meat and other protein foods Fatty cuts of meat. Ribs. Fried meat. Berniece Salines. Sausage. Bologna and other processed lunch meats. Salami. Fatback. Hotdogs. Bratwurst. Salted nuts and seeds. Canned beans with added salt. Canned or smoked fish. Whole eggs or egg yolks. Chicken or [place] with skin. Dairy Whole or 2% milk, cream, and half-and-half. Whole or full-fat cream cheese. Whole-fat or sweetened yogurt. Full-fat cheese. Nondairy creamers. Whipped toppings. Processed cheese and cheese spreads. Fats and oils Butter. Stick margarine. Lard. Shortening. Ghee. Bacon fat. Tropical oils, such as coconut, palm kernel, or palm oil. Seasoning and other foods Salted popcorn and pretzels. Onion salt, garlic salt, seasoned salt, table salt, and sea salt. Worcestershire sauce. Tartar sauce. Barbecue sauce. Teriyaki sauce. Soy sauce, including reduced-sodium. Steak sauce. Canned and packaged gravies. Fish sauce. Oyster sauce. Cocktail sauce. Horseradish that you find on the shelf. Ketchup. Mustard. Meat flavorings and tenderizers. Bouillon cubes. Hot sauce and Tabasco sauce. Premade or packaged marinades. Premade or packaged taco seasonings. Relishes. Regular salad dressings. Where to find more information:  National Heart, Lung, and Blood Institute: https://wilson-eaton.com/  American Heart Association: www.heart.org Summary  The DASH eating plan is a healthy eating plan that has been shown to reduce high blood pressure (hypertension). It may also reduce your risk for type 2 diabetes, heart disease, and stroke.  With the DASH eating plan, you should limit salt (sodium) intake to 2,300 mg a day. If you have hypertension, you may need to reduce your sodium intake to 1,500 mg a day.  When on the DASH eating plan, aim to eat more fresh fruits and vegetables, whole grains, lean  proteins, low-fat dairy, and heart-healthy fats.  Work with your health care provider or diet and nutrition specialist (dietitian) to adjust your eating plan to your individual calorie needs. This information is not intended to replace advice given to you by your health care provider. Make sure you discuss any questions you have with your health care provider. Document Released: 04/29/2011 Document Revised: 05/03/2016 Document Reviewed: 05/03/2016 Elsevier Interactive Patient Education  [employer].

## 2017-12-19 NOTE — Progress Notes (Signed)
BP (!) 150/80 (BP Location: Left Arm, Patient Position: Sitting, Cuff Size: Normal)   Pulse 65   Temp 97.7 F (36.5 C) (Oral)   Resp 16   Ht 5\' 11"  (1.803 m)   Wt 231 lb 9.6 oz (105.1 kg)   SpO2 98%   BMI 32.30 kg/m    Subjective:    Patient ID: [name], male    DOB: 08/12/1955, 62 y.o.   MRN: [id number]  HPI: [name] is a 62 y.o. male  Chief Complaint  Patient presents with  . Medical Clearance    for surgery     HPI Patient is going to have hydrocele repair He had an abnormal EKG and is going to see cardiologist for this soon He believes the slow heart rate was due to taking clonidine before having it done  Patient has mild carotid disease; saw Dr. Wyn Quakerew last week; request was made to come off of the aspirin prior to his hydrocele surgery; see Dr. Driscilla Grammesew's note No new neurologic since the previous stroke Mild aortic disease as well; both aorta and carotid due for recheck in 2020  [facility] Check BP at home, every day; goes up at the doctor Had Cong[place] food yesterday  No chest pain; no wheezing or trouble breathing No open sores; no hx of MRSA No fevers in the last two weeks No problems with previous surgeries, smooth as silk; Dr. Evette CristalSankar  He would like to lose a few pounds; wife gives him fruits and yogurt and salads  [facility] 12/19/2017 09/08/2017 07/28/2017 03/09/2017 11/11/2016  Decreased Interest 0 0 0 0 0  Down, Depressed, Hopeless 0 0 0 0 0  PHQ - 2 Score 0 0 0 0 0  Altered sleeping - - - - -  Tired, decreased energy - - - - -  Change in appetite - - - - -  Feeling bad or failure about yourself  - - - - -  Trouble concentrating - - - - -  Moving slowly or fidgety/restless - - - - -  Suicidal thoughts - - - - -  PHQ-9 Score - - - - -    Relevant past medical, surgical, family and social history reviewed Past Medical History:  Diagnosis Date  . Bowel obstruction ([facility]) 2016   during septic event. no further a  problem once sepsis resolved  . Chronic kidney disease 2016   happened after stroke. dr. Cherylann Ratellateef follows him once a year  . Hyperlipidemia   . Hypertension   . Inguinal hernia   . Pink eye 06/21/2016  . S/P Botox injection    every 3 months into right arm to loosen up muscles  . Sepsis ([facility]) 2016   happened after stroke. unsure when,but kidney disease started. followed by dr. Cherylann Ratellateef  . Stroke [facility]) 12/24/2013   right side weakness   Past Surgical History:  Procedure Laterality Date  . INGUINAL HERNIA REPAIR Bilateral 06/25/2016   Procedure: HERNIA REPAIR INGUINAL ADULT BILATERAL;  Surgeon: Kieth BrightlySeeplaputhur G Sankar, MD;  Location: [facility] ORS;  Service: General;  Laterality: Bilateral;  . KNEE ARTHROSCOPY Right 1970's   Family History  Problem Relation Age of Onset  . Hypertension Mother   . Hernia Sister   . Prostate cancer Neg Hx   . Bladder Cancer Neg Hx   . Kidney cancer Neg Hx    Social History   Tobacco Use  . Smoking status: Never Smoker  . Smokeless  tobacco: Never Used  Substance Use Topics  . Alcohol use: No    Alcohol/week: 0.0 oz  . Drug use: No    Comment: used to use THC in the late 1980s    Interim medical history since last visit reviewed. Allergies and medications reviewed  Review of Systems Per HPI unless specifically indicated above     Objective:    BP (!) 150/80 (BP Location: Left Arm, Patient Position: Sitting, Cuff Size: Normal)   Pulse 65   Temp 97.7 F (36.5 C) (Oral)   Resp 16   Ht 5\' 11"  (1.803 m)   Wt 231 lb 9.6 oz (105.1 kg)   SpO2 98%   BMI 32.30 kg/m   Wt Readings from Last 3 Encounters:  12/23/17 229 lb 12.8 oz (104.2 kg)  12/19/17 231 lb 9.6 oz (105.1 kg)  12/16/17 230 lb (104.3 kg)    Physical Exam  Constitutional: He appears well-developed and well-nourished. No distress.  HENT:  Head: Normocephalic and atraumatic.  Eyes: EOM are normal. No scleral icterus.  Neck: No thyromegaly present.  Cardiovascular: Normal rate and  regular rhythm.  Pulmonary/Chest: Effort normal and breath sounds normal.  Abdominal: Soft. Bowel sounds are normal. He exhibits no distension.  Musculoskeletal: He exhibits no edema.  Neurological: Coordination normal.  Right-sided hemiplegia  Skin: Skin is warm and dry. No pallor.  Psychiatric: He has a normal mood and affect. His behavior is normal. Judgment and thought content normal. His mood appears not anxious. He does not exhibit a depressed mood.      Assessment & Plan:   Problem List Items Addressed This Visit      Other   Preoperative clearance    Patient to get cardiac clearance from cardiologist due to abnormal EKG; he will get BP checked there; if less than 150 mmHg systolic at cardiologist, should be cleared from a medical standpoint for hydrocele repair; vascular has given recommendations regarding aspirin cessation prior to surgery          Follow up plan: No follow-ups on file.  An after-visit summary was printed and given to the patient at check-out.  Please see the patient instructions which may contain other information and recommendations beyond what is mentioned above in the assessment and plan.  No orders of the defined types were placed in this encounter.   No orders of the defined types were placed in this encounter.

## 2017-12-20 ENCOUNTER — Other Ambulatory Visit: Payer: Self-pay | Admitting: Family Medicine

## 2017-12-20 LAB — LIPID PANEL
CHOL/HDL RATIO: 3 (calc) (ref <5.0)
Cholesterol: 147 mg/dL (ref <200)
HDL: 49 mg/dL (ref >40)
LDL Cholesterol (Calc): 84 mg/dL (calc)
NON-HDL CHOLESTEROL (CALC): 98 mg/dL (ref <130)
Triglycerides: 48 mg/dL (ref <150)

## 2017-12-20 LAB — HEMOGLOBIN A1C
HEMOGLOBIN A1C: 6.2 %{Hb} — AB (ref <5.7)
MEAN PLASMA GLUCOSE: 131 (calc)
eAG (mmol/L): 7.3 (calc)

## 2017-12-20 MED ORDER — EZETIMIBE 10 MG PO TABS: 10.0000 mg | ORAL_TABLET | Freq: Every day | ORAL | 3 refills | Status: DC

## 2017-12-20 NOTE — Progress Notes (Signed)
Add zetia to crestor 

## 2017-12-22 DIAGNOSIS — I6529 Occlusion and stenosis of unspecified carotid artery: Secondary | ICD-10-CM

## 2017-12-22 HISTORY — DX: Occlusion and stenosis of unspecified carotid artery: I65.29

## 2017-12-22 NOTE — Pre-Procedure Instructions (Signed)
DR LADA CLEARED MEDICALLY AND REFERRED TO DR ARIDA. APPY 12/23/17 1500

## 2017-12-23 ENCOUNTER — Telehealth: Payer: Self-pay

## 2017-12-23 ENCOUNTER — Ambulatory Visit: Payer: Medicare HMO | Admitting: Cardiovascular Disease

## 2017-12-23 ENCOUNTER — Encounter: Payer: Self-pay | Admitting: Cardiovascular Disease

## 2017-12-23 ENCOUNTER — Encounter

## 2017-12-23 DIAGNOSIS — R9431 Abnormal electrocardiogram [ECG] [EKG]: Secondary | ICD-10-CM

## 2017-12-23 DIAGNOSIS — Z01818 Encounter for other preprocedural examination: Secondary | ICD-10-CM

## 2017-12-23 DIAGNOSIS — I10 Essential (primary) hypertension: Secondary | ICD-10-CM | POA: Diagnosis not present

## 2017-12-23 DIAGNOSIS — E785 Hyperlipidemia, unspecified: Secondary | ICD-10-CM

## 2017-12-23 NOTE — Patient Instructions (Signed)
Your physician recommends that you schedule a follow-up appointment in: AS NEEDED  

## 2017-12-23 NOTE — Assessment & Plan Note (Signed)
History of hyperlipidemia on Zetia and Crestor with lipid profile performed 12/19/2017 revealing a total cholesterol 147 with an HDL of 49.

## 2017-12-23 NOTE — Assessment & Plan Note (Signed)
History of essential hypertension her blood pressure measured today at 149/93.  He is on amlodipine, carvedilol and losartan.  He says his blood pressure at home is much better usually.

## 2017-12-23 NOTE — Assessment & Plan Note (Signed)
[name]  was referred by Dr. Sherie DonLada for preoperative clearance before elective hydrocele surgery in [place].  His risk factors include treated hypertension and hyperlipidemia.  He has had a stroke but is never had a myocardial infarction.  He is not diabetic nor has he smoked.  He denies chest pain or shortness of breath.  His EKG shows nonspecific changes.  Hydrocele surgery as outpatient and low risk.  I do not think he needs functional testing prior to that and he will be cleared at low risk.

## 2017-12-23 NOTE — Progress Notes (Signed)
12/23/2017 [name]   08/24/1955  [id number]  Primary Physician Lada, Janit BernMelinda P, MD Primary Cardiologist: Runell GessJonathan J Onedia Vargus MD FACP, [place]ACC, [place], [place]  HPI:  [name] is a 62 y.o. mild to moderately overweight married African-American male with no children who is accompanied by his wife [name].  He is currently on disability because of recent stroke that occurred on 8/15.  He is never had a heart attack.  His cardiac risk factors include treated hypertension hyperlipidemia.  There is no family history.  He denies chest pain or shortness of breath.  He apparently had some nonspecific changes on his EKG and is scheduled for an elective hydroceles surgical procedure as an outpatient in [place].  He was referred for preoperative clearance.   Current Meds  Medication Sig  . acetaminophen (TYLENOL) 500 MG tablet Take 1,000 mg by mouth daily as needed for moderate pain.  Marland Kitchen. amLODipine (NORVASC) 10 MG tablet Take 1 tablet (10 mg total) by mouth daily.  Marland Kitchen. aspirin EC 325 MG tablet TAKE 1 TABLET (325 MG TOTAL) BY MOUTH DAILY.  . carvedilol (COREG) 6.25 MG tablet Take 1 tablet (6.25 mg total) by mouth 2 (two) times daily with a meal.  . ezetimibe (ZETIA) 10 MG tablet Take 1 tablet (10 mg total) by mouth daily.  Marland Kitchen. losartan (COZAAR) 100 MG tablet Take 1 tablet (100 mg total) by mouth daily.  . Multiple Vitamins-Minerals (ONE-A-DAY MENS HEALTH FORMULA) TABS Take 1 tablet by mouth daily.  . Naphazoline-Hypromellose (TGT LUBRICANT REDNESS RELIEVER OP) Place 1 drop into both eyes daily as needed (redness/ dryness).  . Omega-3 Fatty Acids (FISH OIL) 1000 MG CAPS Take 1,000 mg by mouth daily.  . rosuvastatin (CRESTOR) 40 MG tablet Take 1 tablet (40 mg total) by mouth at bedtime. For cholesterol     No Known Allergies  Social History   Socioeconomic History  . Marital status: Married    Spouse name: Not on file  . Number of children: 0  . Years of education: Not on file  . Highest  education level: Not on file  Occupational History  . Not on file  Social Needs  . Financial resource strain: Not on file  . Food insecurity:    Worry: Not on file    Inability: Not on file  . Transportation needs:    Medical: Not on file    Non-medical: Not on file  Tobacco Use  . Smoking status: Never Smoker  . Smokeless tobacco: Never Used  Substance and Sexual Activity  . Alcohol use: No    Alcohol/week: 0.0 oz  . Drug use: No    Comment: used to use THC in the late 1980s  . Sexual activity: Yes  Lifestyle  . Physical activity:    Days per week: 3 days    Minutes per session: 30 min  . Stress: Not at all  Relationships  . Social connections:    Talks on phone: Not on file    Gets together: Not on file    Attends religious service: Not on file    Active member of club or organization: Not on file    Attends meetings of clubs or organizations: Not on file    Relationship status: Not on file  . Intimate partner violence:    Fear of current or ex partner: Not on file    Emotionally abused: Not on file    Physically abused: Not on file    Forced sexual activity:  Not on file  Other Topics Concern  . Not on file  Social History Narrative  . Not on file     Review of Systems: General: negative for chills, fever, night sweats or weight changes.  Cardiovascular: negative for chest pain, dyspnea on exertion, edema, orthopnea, palpitations, paroxysmal nocturnal dyspnea or shortness of breath Dermatological: negative for rash Respiratory: negative for cough or wheezing Urologic: negative for hematuria Abdominal: negative for nausea, vomiting, diarrhea, bright red blood per rectum, melena, or hematemesis Neurologic: negative for visual changes, syncope, or dizziness All other systems reviewed and are otherwise negative except as noted above.    Blood pressure (!) 149/93, pulse 90, height 5\' 11"  (1.803 m), weight 229 lb 12.8 oz (104.2 kg).  General appearance: alert and  no distress Neck: no adenopathy, no carotid bruit, no JVD, supple, symmetrical, trachea midline and thyroid not enlarged, symmetric, no tenderness/mass/nodules Lungs: clear to auscultation bilaterally Heart: regular rate and rhythm, S1, S2 normal, no murmur, click, rub or gallop Extremities: extremities normal, atraumatic, no cyanosis or edema Pulses: 2+ and symmetric Skin: Skin color, texture, turgor normal. No rashes or lesions Neurologic: Alert and oriented X 3, normal strength and tone. Normal symmetric reflexes. Normal coordination and gait  EKG not performed today  ASSESSMENT AND PLAN:   Hypertension goal BP (blood pressure) < 140/90 History of essential hypertension her blood pressure measured today at 149/93.  He is on amlodipine, carvedilol and losartan.  He says his blood pressure at home is much better usually.  Hyperlipidemia LDL goal <70 History of hyperlipidemia on Zetia and Crestor with lipid profile performed 12/19/2017 revealing a total cholesterol 147 with an HDL of 49.  Abnormal EKG His EKG performed on 12/14/2017 showed sinus bradycardia with nonspecific ST and T wave changes.  Preoperative clearance [name]  was referred by Dr. Sherie Don for preoperative clearance before elective hydrocele surgery in [place].  His risk factors include treated hypertension and hyperlipidemia.  He has had a stroke but is never had a myocardial infarction.  He is not diabetic nor has he smoked.  He denies chest pain or shortness of breath.  His EKG shows nonspecific changes.  Hydrocele surgery as outpatient and low risk.  I do not think he needs functional testing prior to that and he will be cleared at low risk.      Runell Gess MD FACP,FACC,FAHA, [facility] 12/23/2017 3:08 PM

## 2017-12-23 NOTE — Telephone Encounter (Signed)
Called pt to sched AWV w/ [facility]. LVM requesting returned call.  

## 2017-12-23 NOTE — Assessment & Plan Note (Signed)
His EKG performed on 12/14/2017 showed sinus bradycardia with nonspecific ST and T wave changes.

## 2017-12-26 NOTE — Pre-Procedure Instructions (Signed)
CLEARED BY DR J BERRY LOW RISK 12/23/17

## 2017-12-27 NOTE — Assessment & Plan Note (Signed)
Patient to get cardiac clearance from cardiologist due to abnormal EKG; he will get BP checked there; if less than 150 mmHg systolic at cardiologist, should be cleared from a medical standpoint for hydrocele repair; vascular has given recommendations regarding aspirin cessation prior to surgery

## 2017-12-29 NOTE — Telephone Encounter (Signed)
Notified patient that clearances have been received from Dr Wyn Quakerew, Dr Sherie DonLada & Dr Allyson SabalBerry & that he will need to stop aspirin 325mg  on 01/11/2018 & resume medication on 01/20/2018. Patient voices understanding.

## 2018-01-12 ENCOUNTER — Other Ambulatory Visit: Payer: Self-pay

## 2018-01-12 ENCOUNTER — Encounter: Payer: Self-pay | Admitting: *Deleted

## 2018-01-12 ENCOUNTER — Ambulatory Visit
Admission: RE | Admit: 2018-01-12 | Discharge: 2018-01-12 | Disposition: A | Discharge status: Home / Self Care | Payer: Medicare HMO | Source: Ambulatory Visit | Attending: Urology | Admitting: Urology

## 2018-01-12 HISTORY — DX: Occlusion and stenosis of unspecified carotid artery: I65.29

## 2018-01-12 NOTE — Patient Instructions (Signed)
Your procedure is scheduled on: Wednesday, January 18, 2018  Report to THE SECOND FLOOR OF THE MEDICAL MALL.  To find out your arrival time please call [phone] between 1PM - 3PM on  Tuesday, January 17, 2018  Remember: Instructions that are not followed completely may result in serious medical risk,  up to and including death, or upon the discretion of your surgeon and anesthesiologist your  surgery may need to be rescheduled.     _X__ 1. Do not eat food after midnight the night before your procedure.                 No gum chewing or hard candies. ABSOLUTELY NOTHING SOLID IN YOUR MOUTH AFTER MIDNIGHT                  You may drink clear liquids up to 2 hours before you are scheduled to arrive for your surgery-                   DO not drink clear liquids within 2 hours of the start of your surgery.                  Clear Liquids include:  water, apple juice without pulp, clear carbohydrate                 drink such as Clearfast of Gatorade, Black Coffee or Tea (Do not add                 anything to coffee or tea).  __X__2.  On the morning of surgery brush your teeth with toothpaste and water,                       You may rinse your mouth with mouthwash if you wish.                          Do not swallow any toothpaste of mouthwash.     _X__ 3.  No Alcohol for 24 hours before or after surgery.   _X__ 4.  Do Not Smoke or use e-cigarettes For 24 Hours Prior to Your Surgery.                 Do not use any chewable tobacco products for at least 6 hours prior to                 surgery.  ____  5.  Bring all medications with you on the day of surgery if instructed.   ____  6.  Notify your doctor if there is any change in your medical condition      (cold, fever, infections).     Do not wear jewelry, make-up, hairpins, clips or nail polish. Do not wear lotions, powders, or perfumes. You may wear deodorant. Do not shave 48 hours prior to surgery. Men may  shave face and neck. Do not bring valuables to the hospital.    [facility] is not responsible for any belongings or valuables.  Contacts, dentures or bridgework may not be worn into surgery. Leave your suitcase in the car. After surgery it may be brought to your room. For patients admitted to the hospital, discharge time is determined by your treatment team.   Patients discharged the day of surgery will not be allowed to drive home.   Please read over the following fact sheets that you were given:  PREPARING FOR SURGERY  ____ Take these medicines the morning of surgery with A SIP OF WATER:    1. AMLODIPINE  2. CARVEDILOL  3.   4.  5.  6.  ____ Fleet Enema (as directed)   _X___ SHOWER THE DAY OF SURGERY WITH AN ANTIBACTERIAL SOAP  ____ Use inhalers on the day of surgery  _X___ Stop YOUR ASPIRIN AS PER DR. Wyn QuakerEW  (STOPPED 01/11/2018  __X__ Stop Anti-inflammatories AS OF TODAY   ____ Stop supplements until after surgery.                STOP FISH OIL AS OF TODAY  ____ Bring C-Pap to the hospital.   YOU MAY CONTINUE TO TAKE LOSARTAN AND MULTIVITS BUT DO NOT TAKE        EITHER ON THE DAY OF SURGERY.  WEAR LOOSE FITTING CLOTHES   BRING JOCK STRAP AND SNUG UNDERWEAR.

## 2018-01-17 MED ORDER — CEFAZOLIN SODIUM-DEXTROSE 2-4 GM/100ML-% IV SOLN
2.0000 g | INTRAVENOUS | Status: AC
  Administered 2018-01-18: 2 g via INTRAVENOUS

## 2018-01-18 ENCOUNTER — Telehealth: Payer: Self-pay | Admitting: Urology

## 2018-01-18 ENCOUNTER — Ambulatory Visit
Admission: RE | Admit: 2018-01-18 | Discharge: 2018-01-18 | Disposition: A | Discharge status: Home / Self Care | Payer: Medicare HMO | Source: Ambulatory Visit | Attending: Urology | Admitting: Urology

## 2018-01-18 ENCOUNTER — Encounter
Admission: RE | Disposition: A | Discharge status: Home / Self Care | Payer: Self-pay | Source: Ambulatory Visit | Attending: Urology

## 2018-01-18 ENCOUNTER — Ambulatory Visit: Payer: Medicare HMO | Admitting: Anesthesiology

## 2018-01-18 ENCOUNTER — Encounter: Payer: Self-pay | Admitting: *Deleted

## 2018-01-18 ENCOUNTER — Other Ambulatory Visit: Payer: Self-pay

## 2018-01-18 DIAGNOSIS — Z7982 Long term (current) use of aspirin: Secondary | ICD-10-CM | POA: Insufficient documentation

## 2018-01-18 DIAGNOSIS — N189 Chronic kidney disease, unspecified: Secondary | ICD-10-CM | POA: Diagnosis not present

## 2018-01-18 DIAGNOSIS — I10 Essential (primary) hypertension: Secondary | ICD-10-CM | POA: Diagnosis not present

## 2018-01-18 DIAGNOSIS — Z79899 Other long term (current) drug therapy: Secondary | ICD-10-CM | POA: Diagnosis not present

## 2018-01-18 DIAGNOSIS — N433 Hydrocele, unspecified: Secondary | ICD-10-CM | POA: Diagnosis not present

## 2018-01-18 DIAGNOSIS — E785 Hyperlipidemia, unspecified: Secondary | ICD-10-CM | POA: Diagnosis not present

## 2018-01-18 DIAGNOSIS — N430 Encysted hydrocele: Secondary | ICD-10-CM | POA: Diagnosis not present

## 2018-01-18 DIAGNOSIS — Z8673 Personal history of transient ischemic attack (TIA), and cerebral infarction without residual deficits: Secondary | ICD-10-CM | POA: Insufficient documentation

## 2018-01-18 DIAGNOSIS — I129 Hypertensive chronic kidney disease with stage 1 through stage 4 chronic kidney disease, or unspecified chronic kidney disease: Secondary | ICD-10-CM | POA: Diagnosis not present

## 2018-01-18 DIAGNOSIS — E039 Hypothyroidism, unspecified: Secondary | ICD-10-CM | POA: Diagnosis not present

## 2018-01-18 HISTORY — PX: HYDROCELE EXCISION: SHX482

## 2018-01-18 HISTORY — DX: Abdominal aortic aneurysm, without rupture, unspecified: I71.40

## 2018-01-18 HISTORY — DX: Abdominal aortic aneurysm, without rupture: I71.4

## 2018-01-18 SURGERY — HYDROCELECTOMY
Anesthesia: General | Laterality: Left | Wound class: Clean Contaminated

## 2018-01-18 MED ORDER — OXYCODONE-ACETAMINOPHEN 5-325 MG PO TABS
ORAL_TABLET | ORAL | Status: AC
  Administered 2018-01-18: 1 via ORAL
  Filled 2018-01-18: qty 1

## 2018-01-18 MED ORDER — FAMOTIDINE 20 MG PO TABS
20.0000 mg | ORAL_TABLET | Freq: Once | ORAL | Status: AC
  Administered 2018-01-18: 20 mg via ORAL

## 2018-01-18 MED ORDER — GLYCOPYRROLATE 0.2 MG/ML IJ SOLN
INTRAMUSCULAR | Status: DC | PRN
  Administered 2018-01-18: 0.2 mg via INTRAVENOUS

## 2018-01-18 MED ORDER — LIDOCAINE HCL (PF) 1 % IJ SOLN
INTRAMUSCULAR | Status: AC
  Filled 2018-01-18: qty 30

## 2018-01-18 MED ORDER — ONDANSETRON HCL 4 MG/2ML IJ SOLN: 4.0000 mg | Freq: Once | INTRAMUSCULAR | Status: DC | PRN

## 2018-01-18 MED ORDER — BUPIVACAINE HCL 0.5 % IJ SOLN
INTRAMUSCULAR | Status: DC | PRN
  Administered 2018-01-18: 5 mL

## 2018-01-18 MED ORDER — FENTANYL CITRATE (PF) 100 MCG/2ML IJ SOLN
INTRAMUSCULAR | Status: DC | PRN
  Administered 2018-01-18 (×2): 50 ug via INTRAVENOUS

## 2018-01-18 MED ORDER — FAMOTIDINE 20 MG PO TABS
ORAL_TABLET | ORAL | Status: AC
  Filled 2018-01-18: qty 1

## 2018-01-18 MED ORDER — LACTATED RINGERS IV SOLN
INTRAVENOUS | Status: DC
  Administered 2018-01-18: 10:00:00 via INTRAVENOUS

## 2018-01-18 MED ORDER — MIDAZOLAM HCL 2 MG/2ML IJ SOLN
INTRAMUSCULAR | Status: DC | PRN
  Administered 2018-01-18: 2 mg via INTRAVENOUS

## 2018-01-18 MED ORDER — ONDANSETRON HCL 4 MG/2ML IJ SOLN
INTRAMUSCULAR | Status: DC | PRN
  Administered 2018-01-18: 4 mg via INTRAVENOUS

## 2018-01-18 MED ORDER — PROPOFOL 10 MG/ML IV BOLUS
INTRAVENOUS | Status: DC | PRN
  Administered 2018-01-18: 150 mg via INTRAVENOUS

## 2018-01-18 MED ORDER — LIDOCAINE HCL (CARDIAC) PF 100 MG/5ML IV SOSY
PREFILLED_SYRINGE | INTRAVENOUS | Status: DC | PRN
  Administered 2018-01-18: 30 mg via INTRAVENOUS

## 2018-01-18 MED ORDER — FENTANYL CITRATE (PF) 100 MCG/2ML IJ SOLN
INTRAMUSCULAR | Status: AC
  Filled 2018-01-18: qty 2

## 2018-01-18 MED ORDER — OXYCODONE-ACETAMINOPHEN 5-325 MG PO TABS
1.0000 | ORAL_TABLET | ORAL | Status: DC | PRN
  Administered 2018-01-18: 1 via ORAL

## 2018-01-18 MED ORDER — OXYCODONE-ACETAMINOPHEN 5-325 MG PO TABS: 1.0000 | ORAL_TABLET | ORAL | 0 refills | Status: DC | PRN

## 2018-01-18 MED ORDER — BUPIVACAINE HCL (PF) 0.5 % IJ SOLN
INTRAMUSCULAR | Status: AC
  Filled 2018-01-18: qty 30

## 2018-01-18 MED ORDER — LIDOCAINE HCL 1 % IJ SOLN
INTRAMUSCULAR | Status: DC | PRN
  Administered 2018-01-18: 5 mL

## 2018-01-18 MED ORDER — EPHEDRINE SULFATE 50 MG/ML IJ SOLN
INTRAMUSCULAR | Status: DC | PRN
  Administered 2018-01-18: 10 mg via INTRAVENOUS

## 2018-01-18 MED ORDER — DOCUSATE SODIUM 100 MG PO CAPS: 100.0000 mg | ORAL_CAPSULE | Freq: Two times a day (BID) | ORAL | 0 refills | Status: DC

## 2018-01-18 MED ORDER — MIDAZOLAM HCL 2 MG/2ML IJ SOLN
INTRAMUSCULAR | Status: AC
  Filled 2018-01-18: qty 2

## 2018-01-18 MED ORDER — CEFAZOLIN SODIUM-DEXTROSE 2-4 GM/100ML-% IV SOLN
INTRAVENOUS | Status: AC
  Filled 2018-01-18: qty 100

## 2018-01-18 MED ORDER — PROPOFOL 10 MG/ML IV BOLUS
INTRAVENOUS | Status: AC
  Filled 2018-01-18: qty 20

## 2018-01-18 MED ORDER — FENTANYL CITRATE (PF) 100 MCG/2ML IJ SOLN: 25.0000 ug | INTRAMUSCULAR | Status: DC | PRN

## 2018-01-18 SURGICAL SUPPLY — 37 items
ADH SKN CLS APL DERMABOND .7 (GAUZE/BANDAGES/DRESSINGS) ×1
BLADE CLIPPER SURG (BLADE) ×2 IMPLANT
BLADE SURG 15 STRL LF DISP TIS (BLADE) ×1 IMPLANT
BLADE SURG 15 STRL SS (BLADE) ×2
CANISTER SUCT 1200ML W/VALVE (MISCELLANEOUS) ×2 IMPLANT
CHLORAPREP W/TINT 26ML (MISCELLANEOUS) ×2 IMPLANT
DERMABOND ADVANCED (GAUZE/BANDAGES/DRESSINGS) ×1
DERMABOND ADVANCED .7 DNX12 (GAUZE/BANDAGES/DRESSINGS) ×1 IMPLANT
DRAIN PENROSE 1/4X12 LTX (DRAIN) IMPLANT
DRAPE LAPAROTOMY 77X122 PED (DRAPES) ×2 IMPLANT
DRSG GAUZE FLUFF 36X18 (GAUZE/BANDAGES/DRESSINGS) ×2 IMPLANT
ELECT REM PT RETURN 9FT ADLT (ELECTROSURGICAL) ×2
ELECTRODE REM PT RTRN 9FT ADLT (ELECTROSURGICAL) ×1 IMPLANT
GAUZE SPONGE 4X4 12PLY STRL (GAUZE/BANDAGES/DRESSINGS) IMPLANT
GLOVE BIO SURGEON STRL SZ 6.5 (GLOVE) ×2 IMPLANT
GLOVE BIOGEL PI IND STRL 6.5 (GLOVE) ×1 IMPLANT
GLOVE BIOGEL PI INDICATOR 6.5 (GLOVE) ×1
GOWN STRL REUS W/ TWL LRG LVL3 (GOWN DISPOSABLE) ×2 IMPLANT
GOWN STRL REUS W/TWL LRG LVL3 (GOWN DISPOSABLE) ×4
KIT TURNOVER KIT A (KITS) ×2 IMPLANT
LABEL OR SOLS (LABEL) ×2 IMPLANT
NDL HYPO 25X1 1.5 SAFETY (NEEDLE) ×1 IMPLANT
NEEDLE HYPO 25X1 1.5 SAFETY (NEEDLE) ×2 IMPLANT
NS IRRIG 500ML POUR BTL (IV SOLUTION) ×2 IMPLANT
PACK BASIN MINOR ARMC (MISCELLANEOUS) ×2 IMPLANT
SUPPORETR ATHLETIC LG (MISCELLANEOUS) ×1 IMPLANT
SUPPORTER ATHLETIC LG (MISCELLANEOUS)
SUT CHROMIC 3 0 PS 2 (SUTURE) ×4 IMPLANT
SUT CHROMIC 3 0 SH 27 (SUTURE) IMPLANT
SUT ETHILON 3-0 FS-10 30 BLK (SUTURE)
SUT ETHILON NAB PS2 4-0 18IN (SUTURE) ×2 IMPLANT
SUT VIC AB 3-0 SH 27 (SUTURE) ×4
SUT VIC AB 3-0 SH 27X BRD (SUTURE) ×2 IMPLANT
SUT VIC AB 4-0 SH 27 (SUTURE) ×2
SUT VIC AB 4-0 SH 27XANBCTRL (SUTURE) ×1 IMPLANT
SUTURE EHLN 3-0 FS-10 30 BLK (SUTURE) IMPLANT
SYR 10ML LL (SYRINGE) ×2 IMPLANT

## 2018-01-18 NOTE — Anesthesia Procedure Notes (Signed)
Procedure Name: LMA Insertion Date/Time: 01/18/2018 11:15 AM Performed by: Manning CharityEvans, Oluwadarasimi Redmon B, CRNA Pre-anesthesia Checklist: Patient identified, Emergency Drugs available, Suction available and Patient being monitored Patient Re-evaluated:Patient Re-evaluated prior to induction Oxygen Delivery Method: Circle system utilized Preoxygenation: Pre-oxygenation with 100% oxygen Induction Type: IV induction LMA Size: 4.0 Number of attempts: 1 Tube secured with: Tape

## 2018-01-18 NOTE — Anesthesia Postprocedure Evaluation (Signed)
Anesthesia Post Note  Patient: [name]  Procedure(s) Performed: HYDROCELECTOMY ADULT (Left )  Patient location during evaluation: PACU Anesthesia Type: General Level of consciousness: awake and alert Pain management: pain level controlled Vital Signs Assessment: post-procedure vital signs reviewed and stable Respiratory status: spontaneous breathing and respiratory function stable Cardiovascular status: stable Anesthetic complications: no     Last Vitals:  Vitals:   01/18/18 1256 01/18/18 1304  BP: 98/69 106/68  Pulse: 69 65  Resp: 14 16  Temp:  36.4 C  SpO2: 97% 99%    Last Pain:  Vitals:   01/18/18 1304  TempSrc:   PainSc: 6                  KEPHART,WILLIAM K

## 2018-01-18 NOTE — Telephone Encounter (Signed)
-----   Message from Vanna ScotlandAshley Brandon, MD sent at 01/18/2018 12:22 PM EDT ----- Regarding: f/u plan Please reschedule his follow-up.  He does not need to be seen next week.  I would like to see him in 6 weeks follow-up with me or Carollee HerterShannon.  He also needs a nurse visit on Friday for drain removal.

## 2018-01-18 NOTE — Discharge Instructions (Signed)
AMBULATORY SURGERY  DISCHARGE INSTRUCTIONS   1) The drugs that you were given will stay in your system until tomorrow so for the next 24 hours you should not:  A) Drive an automobile B) Make any legal decisions C) Drink any alcoholic beverage   2) You may resume regular meals tomorrow.  Today it is better to start with liquids and gradually work up to solid foods.  You may eat anything you prefer, but it is better to start with liquids, then soup and crackers, and gradually work up to solid foods.   3) Please notify your doctor immediately if you have any unusual bleeding, trouble breathing, redness and pain at the surgery site, drainage, fever, or pain not relieved by medication.    4) Additional Instructions:        Please contact your physician with any problems or Same Day Surgery at [phone], Monday through Friday 6 am to 4 pm, or [place] [facility] number at [phone].Hydrocelectomy, Adult, Care After This sheet gives you information about how to care for yourself after your procedure. Your health care provider may also give you more specific instructions. If you have problems or questions, contact your health care provider. What can I expect after the procedure? After your procedure, it is common to have mild discomfort, swelling, and bruising in the pouch that holds your testicles (scrotum). Follow these instructions at home: Bathing  Ask your health care provider when you can shower, take baths, or go swimming.  If you were told to wear an athletic support strap, take it off when you shower or take a bath. Incision care  Follow instructions from your health care provider about how to take care of your incision. Make sure you: ? Wash your hands with soap and water before you change your bandage (dressing). If soap and water are not available, use hand sanitizer. ? Change your dressing as told by your health care provider. ? Leave stitches (sutures)  in place.  Check your incision and scrotum every day for signs of infection. Check for: ? More redness, swelling, or pain. ? Blood or fluid. ? Warmth. ? Pus or a bad smell. Managing pain, stiffness, and swelling  If directed, apply ice to the injured area: ? Put ice in a plastic bag. ? Place a towel between your skin and the bag. ? Leave the ice on for 20 minutes, 2-3 times per day. Driving  Do not drive for 24 hours if you were given a sedative.  Do not drive or use heavy machinery while taking prescription pain medicine.  Ask your health care provider when it is safe to drive. Activity  Do not do any activities that require great strength and energy (are vigorous) for as long as told by your health care provider.  Return to your normal activities as told by your health care provider. Ask your health care provider what activities are safe for you.  Do not lift anything that is heavier than 10 lb (4.5 kg) until your health care provider says that it is safe. General instructions  Take over-the-counter and prescription medicines only as told by your health care provider.  Keep all follow-up visits as told by your health care provider. This is important.  If you were given an athletic support strap, wear it as told by your health care provider.  If you had a drain put in during the procedure, you will need to return for a follow-up visit to have it  removed. Contact a health care provider if:  Your pain is not controlled with medicine.  You have more redness or swelling around your scrotum.  You have blood or fluid coming from your scrotum.  Your incision feels warm to the touch.  You have pus or a bad smell coming from your scrotum.  You have a fever.   You have a drain.  It is normal to have blood see from the drain.  Please keep gauze over the drain in order to prevent you from soiling her underwear/bed.

## 2018-01-18 NOTE — Op Note (Signed)
Date of procedure: 01/18/18  Preoperative diagnosis:  1. Left hydrocele  Postoperative diagnosis:  1. Left hydrocele  Procedure: 1. Left hydrocelectomy  Surgeon: Vanna ScotlandAshley Tuyen Uncapher, MD  Anesthesia: General  Complications: None  Intraoperative findings: 250 cc of straw-colored fluid and hydrocele sac.  Thickened sac, diffusely oozy.  EBL: Minimal  Specimens: Hydrocele sac  Drains: Quarter-inch Penrose drain  Indication: [name] is a 62 y.o. patient with large symptomatic left hydrocele, proximal softball sized.  After reviewing the management options for treatment, he elected to proceed with the above surgical procedure(s). We have discussed the potential benefits and risks of the procedure, side effects of the proposed treatment, the likelihood of the patient achieving the goals of the procedure, and any potential problems that might occur during the procedure or recuperation. Informed consent has been obtained.  Description of procedure:  The patient was taken to the operating room and general anesthesia was induced.  The patient was placed in the supine position, prepped and draped in the usual sterile fashion, and preoperative antibiotics were administered. A preoperative time-out was performed.   The incision was instilled with 1% lidocaine and Marcaine mix, 10 cc.  Approximately 7 cm incision was made along skin lines transversely across the left hemiscrotum overlying the hydrocele sac.  The incision was carried down to the doctors and subcutaneous tissues using Bovie electrocautery.  Hydrocele sac was exposed and overlying layers were bluntly dissected off.  At this point time, an 11 blade was used to make an incision on the anterior aspect of the hydrocele sac.  The fluid was aspirated which revealed 250 cc of clear straw-colored fluid.  The edges of the sac were then excised using Bovie electrocautery.  Of note, the hydrocele sac was relatively thick with diffusely oozing  edges.  Care was taken to avoid any injury to the epididymis and testicle along with cord structures.  The edges of the hydrocele sac were then cauterized using Bovie then oversewn using a running 3-0 Vicryl suture.  It then worked to achieve careful hemostasis.  Notably, there was a diffuse ooze and all the dark toes and throughout the scrotal sac.  Despite careful meticulous hemostasis with Bovie electrocautery, there continued to be news.  As such, the decision was made to leave a drain.  A small stab incision was made within the dependent scrotum.  1/4 inch Penrose drain was tunneled through the dependent scrotum and secured with a nylon suture. And was laid within the scrotal bed.  The left testicle was then returned back into the scrotal sac with care to ensure the proper orientation.  The dartos layer was then closed using running 3-0 Vicryl suture.  The skin was closed using interrupted 4-0 chromic suture.  Additional Dermabond was applied.  Patient was then clean and dry.  A dressing of 4 x 4's, scrotal fluffs, with scrotal support device was applied.  He was reversed from anesthesia, taken to PACU in stable condition.  Plan: Patient will return on Friday for drain removal as long as the output is not excessive.  He will follow-up in 6 weeks for postop visit.  Vanna ScotlandAshley Ginia Rudell, M.D.

## 2018-01-18 NOTE — Transfer of Care (Signed)
Immediate Anesthesia Transfer of Care Note  Patient: Nelva Naynthony Sze  Procedure(s) Performed: HYDROCELECTOMY ADULT (Left )  Patient Location: PACU  Anesthesia Type:General  Level of Consciousness: sedated  Airway & Oxygen Therapy: Patient Spontanous Breathing and Patient connected to face mask oxygen  Post-op Assessment: Report given to RN and Post -op Vital signs reviewed and stable  Post vital signs: Reviewed and stable  Last Vitals:  Vitals Value Taken Time  BP 99/75   Temp    Pulse 76 01/18/2018 12:21 PM  Resp 11 01/18/2018 12:21 PM  SpO2 100 % 01/18/2018 12:21 PM  Vitals shown include unvalidated device data.  Last Pain:  Vitals:   01/18/18 0950  TempSrc: Temporal  PainSc: 0-No pain         Complications: No apparent anesthesia complications

## 2018-01-18 NOTE — Telephone Encounter (Signed)
Looks like the apps have already been made   TangerineMichelle

## 2018-01-18 NOTE — Anesthesia Preprocedure Evaluation (Addendum)
Anesthesia Evaluation  Patient identified by MRN, date of birth, ID band Patient awake    Reviewed: Allergy & Precautions, NPO status , Patient's Chart, lab work & pertinent test results  History of Anesthesia Complications Negative for: history of anesthetic complications  Airway Mallampati: III       Dental   Pulmonary neg sleep apnea, neg COPD,           Cardiovascular hypertension, Pt. on medications and Pt. on home beta blockers (-) Past MI and (-) CHF (-) dysrhythmias (-) Valvular Problems/Murmurs     Neuro/Psych neg Seizures CVA (R sided weakness), Residual Symptoms    GI/Hepatic Neg liver ROS, neg GERD  ,  Endo/Other  neg diabetes  Renal/GU Renal InsufficiencyRenal disease     Musculoskeletal   Abdominal   Peds  Hematology   Anesthesia Other Findings   Reproductive/Obstetrics                            Anesthesia Physical Anesthesia Plan  ASA: III  Anesthesia Plan: General   Post-op Pain Management:    Induction: Intravenous  PONV Risk Score and Plan: 2 and Dexamethasone and Ondansetron  Airway Management Planned: LMA  Additional Equipment:   Intra-op Plan:   Post-operative Plan:   Informed Consent: I have reviewed the patients History and Physical, chart, labs and discussed the procedure including the risks, benefits and alternatives for the proposed anesthesia with the patient or authorized representative who has indicated his/her understanding and acceptance.     Plan Discussed with:   Anesthesia Plan Comments:         Anesthesia Quick Evaluation

## 2018-01-18 NOTE — Anesthesia Post-op Follow-up Note (Signed)
Anesthesia QCDR form completed.        

## 2018-01-18 NOTE — H&P (Signed)
12/07/2017  --> updated 01/18/2018 without change RRR CTAB   Nelva Naynthony Gribble 07/20/1955 [id number]  Referring provider: Kerman PasseyLada, Melinda P, MD [address] [place], [place] [id number]     Chief Complaint  Patient presents with  . Groin Swelling    HPI: 62 year old male who presents today for further evaluation of left testicular swelling.  He was seen and evaluated at urgent care just yesterday for left-sided hemiscrotal swelling.  Scrotal ultrasound confirmed the presence of a large left hydrocele without evidence of infection or torsion.  He reports that he noticed dramatic increase in his testicular size a few weeks ago.  This is not precipitated by anything.  Is not particular painful just bothersome in size.  It continued to enlarge slowly.  Is stabilized over the past few days.  He is having discomfort from the size of his scrotum with position changes and ambulating.  He otherwise has no testicular pain or discomfort.  He has not tried NSAIDs or Tylenol.  He is wearing a scrotal support which helps the discomfort.  He denies any testicular pain or urinary symptoms.  No dysuria, frequency, urgency, or gross hematuria.  No history of UTIs.  No hisotry of testicular trauma or injury.  He does have a history of bilateral inguinal hernia repair 2018.   PMH:     Past Medical History:  Diagnosis Date  . Bowel obstruction (HCC)   . Chronic kidney disease   . Hyperlipidemia   . Hypertension   . Inguinal hernia   . Pink eye 06/21/2016  . Sepsis (HCC)   . Stroke [facility](HCC) 12/24/2013   right side weakness    Surgical History:      Past Surgical History:  Procedure Laterality Date  . INGUINAL HERNIA REPAIR Bilateral 06/25/2016   Procedure: HERNIA REPAIR INGUINAL ADULT BILATERAL;  Surgeon: Kieth BrightlySeeplaputhur G Sankar, MD;  Location: [facility] ORS;  Service: General;  Laterality: Bilateral;  . KNEE ARTHROSCOPY Right 1970's    Home Medications:    Allergies as of 12/07/2017   No Known Allergies              Medication List            Accurate as of 12/07/17  9:31 AM. Always use your most recent med list.           amLODipine 10 MG tablet Commonly known as:  NORVASC Take 1 tablet (10 mg total) by mouth daily.   aspirin EC 325 MG tablet TAKE 1 TABLET (325 MG TOTAL) BY MOUTH DAILY.   carvedilol 6.25 MG tablet Commonly known as:  COREG Take 1 tablet (6.25 mg total) by mouth 2 (two) times daily with a meal.   cloNIDine 0.1 MG tablet Commonly known as:  CATAPRES Take 1 tablet (0.1 mg total) by mouth every 8 (eight) hours as needed. (ONLY if needed for high blood pressure)   Fish Oil 1000 MG Caps Take 1,000 mg by mouth daily.   losartan 100 MG tablet Commonly known as:  COZAAR Take 1 tablet (100 mg total) by mouth daily.   ONE-A-DAY MENS HEALTH FORMULA Tabs Take 1 tablet by mouth daily.   rosuvastatin 40 MG tablet Commonly known as:  CRESTOR Take 1 tablet (40 mg total) by mouth at bedtime. For cholesterol       Allergies: No Known Allergies  Family History:      Family History  Problem Relation Age of Onset  . Hypertension Mother   . Hernia Sister   .  Prostate cancer Neg Hx   . Bladder Cancer Neg Hx   . Kidney cancer Neg Hx     Social History:  reports that he has never smoked. He has never used smokeless tobacco. He reports that he does not drink alcohol or use drugs.  ROS: UROLOGY Frequent Urination?: No Hard to postpone urination?: No Burning/pain with urination?: No Get up at night to urinate?: No Leakage of urine?: No Urine stream starts and stops?: No Trouble starting stream?: No Do you have to strain to urinate?: No Blood in urine?: No Urinary tract infection?: No Sexually transmitted disease?: No Injury to kidneys or bladder?: No Painful intercourse?: No Weak stream?: No Erection problems?: No Penile pain?: No  Gastrointestinal Nausea?:  No Vomiting?: No Indigestion/heartburn?: No Diarrhea?: No Constipation?: No  Constitutional Fever: No Night sweats?: No Weight loss?: No Fatigue?: No  Skin Skin rash/lesions?: No Itching?: No  Eyes Blurred vision?: No Double vision?: No  Ears/Nose/Throat Sore throat?: No Sinus problems?: No  Hematologic/Lymphatic Swollen glands?: No Easy bruising?: No  Cardiovascular Leg swelling?: No Chest pain?: No  Respiratory Cough?: No Shortness of breath?: No  Endocrine Excessive thirst?: No  Musculoskeletal Back pain?: No Joint pain?: No  Neurological Headaches?: No Dizziness?: No  Psychologic Depression?: No Anxiety?: No  Physical Exam: BP 134/78   Pulse 74   Ht 5\' 11"  (1.803 m)   Wt 232 lb (105.2 kg)   BMI 32.36 kg/m   Constitutional:  Alert and oriented, No acute distress.  Accompanied by wife today. HEENT: [place] AT, moist mucus membranes.  Trachea midline, no masses. Cardiovascular: No clubbing, cyanosis, or edema. Respiratory: Normal respiratory effort, no increased work of breathing. GI: Abdomen is soft, nontender, nondistended, no abdominal masses GU: Normal phallus.  Large softball sized left hydrocele, testicle nonpalpable.  Right testicle normal.  No scrotal skin changes or edema.   Skin: No rashes, bruises or suspicious lesions. Neurologic: Grossly intact, no focal deficits, moving all 4 extremities.  Right sided weakness noted with abnormal gait. Psychiatric: Normal mood and affect.  Laboratory Data: RecentLabs       Lab Results  Component Value Date   WBC 6.4 06/30/2017   HGB 14.5 06/30/2017   HCT 43.5 06/30/2017   MCV 84.1 06/30/2017   PLT 269 06/30/2017      RecentLabs       Lab Results  Component Value Date   CREATININE 1.10 06/30/2017      RecentLabs       Lab Results  Component Value Date   HGBA1C 5.9 (H) 03/09/2017      Urinalysis Component Value Ref Range Performed At Pathologist  Signature  Color Yellow Yellow [facility]   Clarity Clear Clear [facility]   Specific Gravity 1.020 1.000 - 1.030 [facility]   pH, Urine 5.5 5.0 - 8.0 [facility]   Protein, Urinalysis Negative Negative, Trace mg/dL [facility]   Glucose, Urinalysis Negative Negative mg/dL [facility]   Ketones, Urinalysis Negative Negative mg/dL [facility]   Blood, Urinalysis Negative Negative [facility]   Nitrite, Urinalysis Negative Negative [facility]   Leukocyte Esterase, Urinalysis Negative Negative [facility]   White Blood Cells, Urinalysis 0-3 None Seen, 0-3 /hpf [facility]   Red Blood Cells, Urinalysis None Seen None Seen, 0-3 /hpf [facility]   Bacteria, Urinalysis None Seen None Seen /hpf [facility]   Squamous Epithelial Cells, Urinalysis Rare Rare, Few, None Seen /hpf [facility]      Pertinent Imaging: CLINICAL DATA: Scrotal swelling  EXAM: SCROTAL ULTRASOUND  DOPPLER ULTRASOUND OF THE TESTICLES  TECHNIQUE: Complete ultrasound examination of the testicles, epididymis, and other scrotal structures was performed. Color and spectral Doppler ultrasound were also utilized to evaluate blood flow to the testicles.  COMPARISON: None.  FINDINGS: Right testicle  Measurements: 5.4 x 2.6 x 2.3 cm. No mass or microlithiasis visualized.  Left testicle  Measurements: 5.4 x 2.9 x 2.6 cm. No mass or microlithiasis visualized.  Right epididymis: Normal in size and appearance. No inflammatory focus evident.  Left epididymis: Epididymal structures are compressed by large hydrocele. No mass or inflammatory focus evident.  Hydrocele: There is a large left-sided hydrocele. There is a minimal right-sided hydrocele.  Varicocele: None  visualized.  Pulsed Doppler interrogation of both testes demonstrates normal low resistance arterial and venous waveforms bilaterally.  No scrotal wall thickening or abscess.  IMPRESSION: 1. Large hydrocele on the left which compresses the left epididymis and to a lesser extent the left testis.  2. No intratesticular or extratesticular mass on either side. No testicular torsion on either side. No orchitis or epididymitis on either side.  3. Small right hydrocele.   Electronically Signed By: Bretta Bang III M.D. On: 12/06/2017 10:35   Assessment & Plan:    1. Left hydrocele Symptomatic soft the size hydrocele Given that it occurred rather acutely, it is likely inflammatory nature We discussed that these often resolve but given the very large size, is unlikely to resolve completely We discussed alternatives for treatment today including percutaneous drainage versus hydrocelectomy versus observation  He is interested in hydrocelectomy.  We discussed the risk of surgery including risk of bleeding, infection, damage to surrounding structures, hematoma, discomfort, and recurrence at length today.  All questions were answered.  He does have a personal history of stroke and is on a full aspirin.  Ideally, this could be held perioperatively to reduce the risk of hematoma.  We will check with Dr. Sherie Don to see if this is possible and the risks associated with this.  He went down to 81 mg at the time of his hernia repair.  Vanna Scotland, MD  [facility] [address] [place], [place] [address] [phone] .

## 2018-01-19 ENCOUNTER — Encounter: Payer: Self-pay | Admitting: Urology

## 2018-01-19 ENCOUNTER — Telehealth: Payer: Self-pay | Admitting: Urology

## 2018-01-19 NOTE — Telephone Encounter (Signed)
Please let this patient know he can go back on his pills but I like him to hold aspirin until the drain is out and is not bleeding anymore.  Yes he can have coffee and whatever he wants.  Vanna ScotlandAshley Kimble Delaurentis, MD

## 2018-01-20 ENCOUNTER — Ambulatory Visit: Payer: Medicare HMO

## 2018-01-20 LAB — SURGICAL PATHOLOGY

## 2018-01-25 ENCOUNTER — Ambulatory Visit: Payer: Medicare HMO | Admitting: Urology

## 2018-02-02 ENCOUNTER — Encounter: Payer: Self-pay | Admitting: Urology

## 2018-02-14 ENCOUNTER — Ambulatory Visit: Payer: Medicare HMO | Admitting: Cardiovascular Disease

## 2018-03-01 ENCOUNTER — Ambulatory Visit (INDEPENDENT_AMBULATORY_CARE_PROVIDER_SITE_OTHER): Payer: Medicare HMO | Admitting: Urology

## 2018-03-01 VITALS — BP 157/95 | HR 76 | Ht 71.0 in | Wt 234.0 lb

## 2018-03-01 DIAGNOSIS — N433 Hydrocele, unspecified: Secondary | ICD-10-CM

## 2018-03-01 NOTE — Progress Notes (Signed)
03/01/2018 8:31 AM   [name] 18-Mar-1956 [id number]  Referring provider: Kerman Passey, MD [address] [place], [place] [address]  Chief Complaint  Patient presents with  . Routine Post Op    HPI: 62 yo M s/p left hydrocelectomy who returns today for routine follow up.    Incision has healed well.  He is pleased with the result.    He is anxious to get back to biking.    PMH: Past Medical History:  Diagnosis Date  . AAA (abdominal aortic aneurysm) (HCC)   . Bowel obstruction (HCC) 2016   during septic event. no further a problem once sepsis resolved  . Chronic kidney disease 2016   happened after stroke. dr. Cherylann Ratel follows him once a year  . Hyperlipidemia   . Hypertension   . Inguinal hernia   . Mild atherosclerosis of carotid artery 12/2017   bilateral stenosis per vascular surgeon  . Pink eye 06/21/2016  . S/P Botox injection    every 3 months into right arm to loosen up muscles  . Sepsis (HCC) 2016   happened after stroke. unsure when,but kidney disease started. followed by dr. Cherylann Ratel  . Stroke [facility]) 12/24/2013   right side weakness    Surgical History: Past Surgical History:  Procedure Laterality Date  . HYDROCELE EXCISION Left 01/18/2018   Procedure: HYDROCELECTOMY ADULT;  Surgeon: Vanna Scotland, MD;  Location: [facility] ORS;  Service: Urology;  Laterality: Left;  . INGUINAL HERNIA REPAIR Bilateral 06/25/2016   Procedure: HERNIA REPAIR INGUINAL ADULT BILATERAL;  Surgeon: Kieth Brightly, MD;  Location: [facility] ORS;  Service: General;  Laterality: Bilateral;  . KNEE ARTHROSCOPY Right 1970's    Home Medications:  Allergies as of 03/01/2018   No Known Allergies     Medication List        Accurate as of 03/01/18  8:31 AM. Always use your most recent med list.          acetaminophen 500 MG tablet Commonly known as:  TYLENOL Take 1,000 mg by mouth daily as needed for moderate pain.   amLODipine 10 MG tablet Commonly known as:   NORVASC Take 1 tablet (10 mg total) by mouth daily.   aspirin EC 325 MG tablet TAKE 1 TABLET (325 MG TOTAL) BY MOUTH DAILY.   carvedilol 6.25 MG tablet Commonly known as:  COREG Take 1 tablet (6.25 mg total) by mouth 2 (two) times daily with a meal.   cloNIDine 0.1 MG tablet Commonly known as:  CATAPRES Take by mouth.   docusate sodium 100 MG capsule Commonly known as:  COLACE Take 1 capsule (100 mg total) by mouth 2 (two) times daily.   ezetimibe 10 MG tablet Commonly known as:  ZETIA Take 1 tablet (10 mg total) by mouth daily.   Fish Oil 1000 MG Caps Take 1,000 mg by mouth daily.   losartan 100 MG tablet Commonly known as:  COZAAR Take 1 tablet (100 mg total) by mouth daily.   multivitamin Tabs tablet Take by mouth.   omega-3 acid ethyl esters 1 g capsule Commonly known as:  LOVAZA Take by mouth.   ONE-A-DAY MENS HEALTH FORMULA Tabs Take 1 tablet by mouth daily.   oxyCODONE-acetaminophen 5-325 MG tablet Commonly known as:  PERCOCET/ROXICET Take 1-2 tablets by mouth every 4 (four) hours as needed for moderate pain or severe pain.   rosuvastatin 40 MG tablet Commonly known as:  CRESTOR Take 1 tablet (40 mg total) by mouth at bedtime.  For cholesterol   TGT LUBRICANT REDNESS RELIEVER OP Place 1 drop into both eyes daily as needed (redness/ dryness).       Allergies: No Known Allergies  Family History: Family History  Problem Relation Age of Onset  . Hypertension Mother   . Hernia Sister   . Prostate cancer Neg Hx   . Bladder Cancer Neg Hx   . Kidney cancer Neg Hx     Social History:  reports that he has never smoked. He has never used smokeless tobacco. He reports that he does not drink alcohol or use drugs.  ROS: UROLOGY Frequent Urination?: No Hard to postpone urination?: No Burning/pain with urination?: No Get up at night to urinate?: No Leakage of urine?: No Urine stream starts and stops?: No Trouble starting stream?: No Do you have to  strain to urinate?: No Blood in urine?: No Urinary tract infection?: No Sexually transmitted disease?: No Injury to kidneys or bladder?: No Painful intercourse?: No Weak stream?: No Erection problems?: No Penile pain?: No  Gastrointestinal Nausea?: No Vomiting?: No Indigestion/heartburn?: No Diarrhea?: No Constipation?: No  Constitutional Fever: No Night sweats?: No Weight loss?: No Fatigue?: No  Skin Skin rash/lesions?: No Itching?: No  Eyes Blurred vision?: No Double vision?: No  Ears/Nose/Throat Sore throat?: No Sinus problems?: No  Hematologic/Lymphatic Swollen glands?: No Easy bruising?: No  Cardiovascular Leg swelling?: No Chest pain?: No  Respiratory Cough?: No Shortness of breath?: No  Endocrine Excessive thirst?: No  Musculoskeletal Back pain?: No Joint pain?: No  Neurological Headaches?: No Dizziness?: No  Psychologic Depression?: No Anxiety?: No  Physical Exam: BP (!) 157/95 (BP Location: Left Arm, Patient Position: Sitting, Cuff Size: Large)   Pulse 76   Ht 5\' 11"  (1.803 m)   Wt 234 lb (106.1 kg)   BMI 32.64 kg/m   Constitutional:  Alert and oriented, No acute distress. HEENT: [place] AT, moist mucus membranes.  Trachea midline, no masses. Cardiovascular: No clubbing, cyanosis, or edema. Respiratory: Normal respiratory effort, no increased work of breathing. GI: Abdomen is soft, nontender, nondistended, no abdominal masses GU: Scrotal incision well healed.  Fullness of the left testicle and cord without recurrence of hydrocele.  Normal sized scrotum.   Skin: No rashes, bruises or suspicious lesions. Neurologic: Grossly intact, no focal deficits, moving all 4 extremities. Psychiatric: Normal mood and affect.  Laboratory Data: Lab Results  Component Value Date   WBC 4.4 12/14/2017   HGB 13.9 12/14/2017   HCT 41.2 12/14/2017   MCV 84.9 12/14/2017   PLT 231 12/14/2017    Lab Results  Component Value Date   CREATININE 1.05  12/14/2017    Lab Results  Component Value Date   HGBA1C 6.2 (H) 12/19/2017    Urinalysis N/a  Pertinent Imaging: n/a  Assessment & Plan:    1. Left hydrocele S/p uncomplicated hydrocelectomy Doing well Return to normal activity  F/u prn  Vanna Scotland, MD  [facility] [address], [address] [place], [place] [address] [phone]

## 2018-03-03 ENCOUNTER — Ambulatory Visit: Payer: Medicare HMO | Admitting: Physical Medicine & Rehabilitation

## 2018-03-04 ENCOUNTER — Other Ambulatory Visit: Payer: Self-pay | Admitting: Family Medicine

## 2018-03-04 DIAGNOSIS — I10 Essential (primary) hypertension: Secondary | ICD-10-CM

## 2018-03-10 ENCOUNTER — Encounter: Payer: Self-pay | Admitting: Family Medicine

## 2018-03-10 ENCOUNTER — Ambulatory Visit (INDEPENDENT_AMBULATORY_CARE_PROVIDER_SITE_OTHER): Payer: Medicare HMO | Admitting: Family Medicine

## 2018-03-10 VITALS — BP 136/80 | HR 89 | Temp 97.8°F | Ht 71.0 in | Wt 230.8 lb

## 2018-03-10 DIAGNOSIS — E785 Hyperlipidemia, unspecified: Secondary | ICD-10-CM

## 2018-03-10 DIAGNOSIS — Z23 Encounter for immunization: Secondary | ICD-10-CM

## 2018-03-10 DIAGNOSIS — G8110 Spastic hemiplegia affecting unspecified side: Secondary | ICD-10-CM

## 2018-03-10 DIAGNOSIS — E669 Obesity, unspecified: Secondary | ICD-10-CM | POA: Diagnosis not present

## 2018-03-10 DIAGNOSIS — I6523 Occlusion and stenosis of bilateral carotid arteries: Secondary | ICD-10-CM

## 2018-03-10 DIAGNOSIS — R7303 Prediabetes: Secondary | ICD-10-CM

## 2018-03-10 DIAGNOSIS — I10 Essential (primary) hypertension: Secondary | ICD-10-CM | POA: Diagnosis not present

## 2018-03-10 NOTE — Assessment & Plan Note (Signed)
Continue current medicines; try to lose a little weight, follow DASH guidelines

## 2018-03-10 NOTE — Assessment & Plan Note (Signed)
Continue the statin, healthy eating, weight loss encouraged; Zetia was too expensive

## 2018-03-10 NOTE — Assessment & Plan Note (Signed)
Encouraged weight loss; should help with the lipids

## 2018-03-10 NOTE — Assessment & Plan Note (Signed)
Right side; continue to see the physiatrist, neurologist; continue aspirin; goal LDL lessl than 70

## 2018-03-10 NOTE — Assessment & Plan Note (Signed)
Managed by vascular doctor 

## 2018-03-10 NOTE — Assessment & Plan Note (Signed)
Weight loss stressed; check A1c in January

## 2018-03-10 NOTE — Progress Notes (Signed)
BP 136/80   Pulse 89   Temp 97.8 F (36.6 C) (Oral)   Ht 5\' 11"  (1.803 m)   Wt 230 lb 12.8 oz (104.7 kg)   SpO2 98%   BMI 32.19 kg/m    Subjective:    Patient ID: [name], male    DOB: May 13, 1956, 62 y.o.   MRN: [id number]  HPI: [name] is a 62 y.o. male  Chief Complaint  Patient presents with  . Follow-up    HPI Patient is here for f/u  BP has been very good; 117/72 one morning; usually in the 120s; well-controlled He underwent repair of left hydrocoele; surgeon has cleared him and he is back to exercising Going to the kidney doctor periodically in [place] On 325 mg aspirin; no bleeding from gums He has high cholesterol; he is on rosumvastatin; not taking zetia because the medicine is too expensive; taking fish oil He is a great eater; no ham or bologna; fruit every morning; whole wheat cereal He saw Dr. Wyn Quaker (vascular doctor) and he reviewed the charts; he cleared him for one year Prediabetes; no dry mouth or blurred vision Lab Results  Component Value Date   HGBA1C 6.2 (H) 12/19/2017   Depression screen [facility] 2/9 03/10/2018 12/19/2017 09/08/2017 07/28/2017 03/09/2017  Decreased Interest 0 0 0 0 0  Down, Depressed, Hopeless 0 0 0 0 0  PHQ - 2 Score 0 0 0 0 0  Altered sleeping - - - - -  Tired, decreased energy - - - - -  Change in appetite - - - - -  Feeling bad or failure about yourself  - - - - -  Trouble concentrating - - - - -  Moving slowly or fidgety/restless - - - - -  Suicidal thoughts - - - - -  PHQ-9 Score - - - - -   Fall Risk  03/10/2018 12/19/2017 10/11/2017 09/08/2017 08/30/2017  Falls in the past year? No No No No No  Risk for fall due to : - - - - -    Relevant past medical, surgical, family and social history reviewed Past Medical History:  Diagnosis Date  . AAA (abdominal aortic aneurysm) (HCC)   . Bowel obstruction (HCC) 2016   during septic event. no further a problem once sepsis resolved  . Chronic kidney disease 2016   happened  after stroke. dr. Cherylann Ratel follows him once a year  . Hyperlipidemia   . Hypertension   . Inguinal hernia   . Mild atherosclerosis of carotid artery 12/2017   bilateral stenosis per vascular surgeon  . Pink eye 06/21/2016  . S/P Botox injection    every 3 months into right arm to loosen up muscles  . Sepsis (HCC) 2016   happened after stroke. unsure when,but kidney disease started. followed by dr. Cherylann Ratel  . Stroke [facility]) 12/24/2013   right side weakness   Past Surgical History:  Procedure Laterality Date  . HYDROCELE EXCISION Left 01/18/2018   Procedure: HYDROCELECTOMY ADULT;  Surgeon: Vanna Scotland, MD;  Location: [facility] ORS;  Service: Urology;  Laterality: Left;  . INGUINAL HERNIA REPAIR Bilateral 06/25/2016   Procedure: HERNIA REPAIR INGUINAL ADULT BILATERAL;  Surgeon: Kieth Brightly, MD;  Location: [facility] ORS;  Service: General;  Laterality: Bilateral;  . KNEE ARTHROSCOPY Right 1970's   Family History  Problem Relation Age of Onset  . Hypertension Mother   . Hernia Sister   . Prostate cancer Neg Hx   . Bladder Cancer Neg Hx   .  Kidney cancer Neg Hx    Social History   Tobacco Use  . Smoking status: Never Smoker  . Smokeless tobacco: Never Used  Substance Use Topics  . Alcohol use: No    Alcohol/week: 0.0 standard drinks  . Drug use: No    Comment: used to use THC in the late 1980s     Office Visit from 03/10/2018 in [facility]  AUDIT-C Score  0      Interim medical history since last visit reviewed. Allergies and medications reviewed  Review of Systems Per HPI unless specifically indicated above     Objective:    BP 136/80   Pulse 89   Temp 97.8 F (36.6 C) (Oral)   Ht 5\' 11"  (1.803 m)   Wt 230 lb 12.8 oz (104.7 kg)   SpO2 98%   BMI 32.19 kg/m   Wt Readings from Last 3 Encounters:  03/10/18 230 lb 12.8 oz (104.7 kg)  03/01/18 234 lb (106.1 kg)  01/18/18 228 lb 9.6 oz (103.7 kg)    Physical Exam  Constitutional: He appears  well-developed and well-nourished. No distress.  HENT:  Head: Normocephalic and atraumatic.  Eyes: EOM are normal. No scleral icterus.  Neck: No thyromegaly present.  Cardiovascular: Normal rate and regular rhythm.  Pulmonary/Chest: Effort normal and breath sounds normal.  Abdominal: Soft. Bowel sounds are normal. He exhibits no distension.  Musculoskeletal: He exhibits no edema.  Neurological: He exhibits abnormal muscle tone. Gait abnormal. Coordination normal.  Spastic hemiplegia on the RIGHT  Skin: Skin is warm and dry. No pallor.  Psychiatric: He has a normal mood and affect. His behavior is normal. Judgment and thought content normal. His mood appears not anxious. He does not exhibit a depressed mood.      Assessment & Plan:   Problem List Items Addressed This Visit      Cardiovascular and Mediastinum   Hypertension goal BP (blood pressure) < 140/90 - Primary    Continue current medicines; try to lose a little weight, follow DASH guidelines      Atherosclerosis of both carotid arteries    Managed by vascular doctor        Nervous and Auditory   Spastic hemiplegia affecting dominant side (HCC)    Right side; continue to see the physiatrist, neurologist; continue aspirin; goal LDL lessl than 70        Other   Pre-diabetes    Weight loss stressed; check A1c in January      Obesity (BMI 30.0-34.9)    Encouraged weight loss; should help with the lipids      Hyperlipidemia LDL goal <70    Continue the statin, healthy eating, weight loss encouraged; Zetia was too expensive       Other Visit Diagnoses    Need for influenza vaccination       Relevant Orders   Flu Vaccine QUAD 6+ mos PF IM (Fluarix Quad PF) (Completed)       Follow up plan: No follow-ups on file.  An after-visit summary was printed and given to the patient at check-out.  Please see the patient instructions which may contain other information and recommendations beyond what is mentioned above in  the assessment and plan.  No orders of the defined types were placed in this encounter.   Orders Placed This Encounter  Procedures  . Flu Vaccine QUAD 6+ mos PF IM (Fluarix Quad PF)

## 2018-03-10 NOTE — Patient Instructions (Addendum)
Try to limit saturated fats in your diet (bologna, hot dogs, barbeque, cheeseburgers, hamburgers, steak, bacon, sausage, cheese, etc.) and get more fresh fruits, vegetables, and whole grains Check out the information at familydoctor.org entitled "Nutrition for Weight Loss: What You Need to Know about Fad Diets" Try to lose between 1-2 pounds per week by taking in fewer calories and burning off more calories You can succeed by limiting portions, limiting foods dense in calories and fat, becoming more active, and drinking 8 glasses of water a day (64 ounces) Don't skip meals, especially breakfast, as skipping meals may alter your metabolism Do not use over-the-counter weight loss pills or gimmicks that claim rapid weight loss A healthy BMI (or body mass index) is between 18.5 and 24.9 You can calculate your ideal BMI at the NIH website http://www.nhlbi.nih.gov/health/educational/lose_wt/BMI/bmicalc.htm  

## 2018-03-31 ENCOUNTER — Ambulatory Visit: Payer: Medicare HMO | Admitting: Physical Medicine & Rehabilitation

## 2018-03-31 ENCOUNTER — Encounter: Payer: Medicare HMO | Attending: Physical Medicine & Rehabilitation

## 2018-03-31 ENCOUNTER — Encounter: Payer: Self-pay | Admitting: Physical Medicine & Rehabilitation

## 2018-03-31 VITALS — BP 163/92 | HR 71 | Ht 71.5 in | Wt 230.0 lb

## 2018-03-31 DIAGNOSIS — G8111 Spastic hemiplegia affecting right dominant side: Secondary | ICD-10-CM | POA: Insufficient documentation

## 2018-03-31 DIAGNOSIS — G8110 Spastic hemiplegia affecting unspecified side: Secondary | ICD-10-CM

## 2018-03-31 NOTE — Progress Notes (Signed)
Botox Injection for spasticity using needle EMG guidance  Dilution: 50 Units/ml Indication: Severe spasticity which interferes with ADL,mobility and/or  hygiene and is unresponsive to medication management and other conservative care Informed consent was obtained after describing risks and benefits of the procedure with the patient. This includes bleeding, bruising, infection, excessive weakness, or medication side effects. A REMS form is on file and signed. Needle: 27g 1" needle electrode Number of units per muscle Biceps125 Brachialis 125 FCR25 FCU0 FDS25 FDP50 PT50  All injections were done after obtaining appropriate EMG activity and after negative drawback for blood. The patient tolerated the procedure well. Post procedure instructions were given. A followup appointment was made.  

## 2018-03-31 NOTE — Patient Instructions (Signed)

## 2018-04-20 IMAGING — US US RENAL
1 series · 14 of 25 positions shown · non-contrast
Comparison: CT abdomen/ pelvis dated 05/10/2016

CLINICAL DATA: Acute renal failure

EXAM:
RENAL / URINARY TRACT ULTRASOUND COMPLETE

[Series 1: us renal · 0.30mm/px · 14 of 62 slices shown]
[im 1/62]
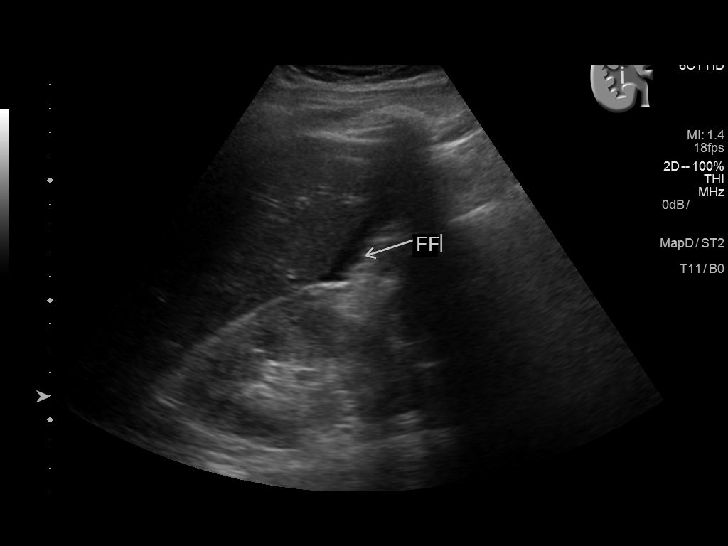
[im 6/62]
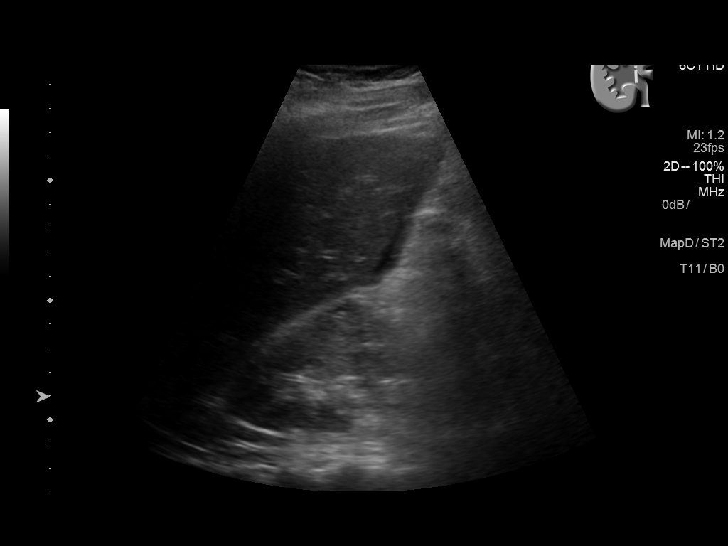
[im 11/62]
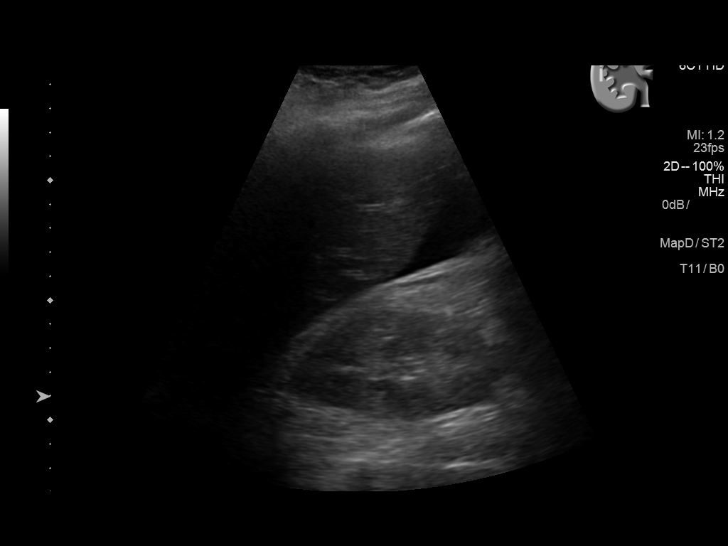
[im 16/62]
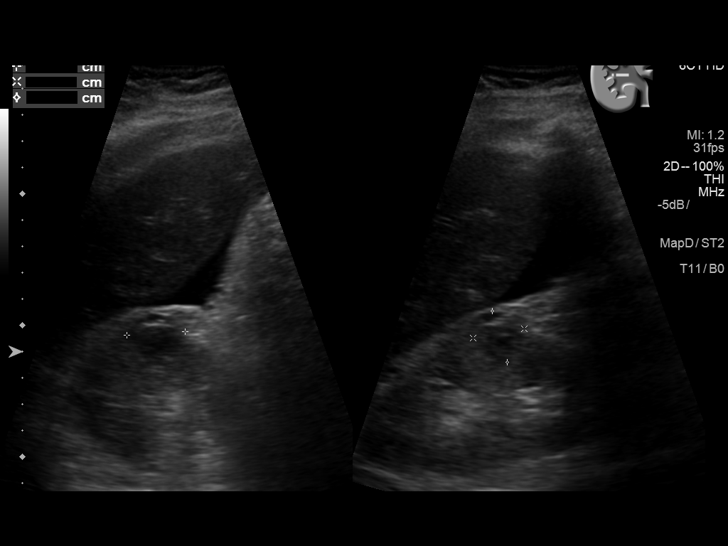
[im 21/62]
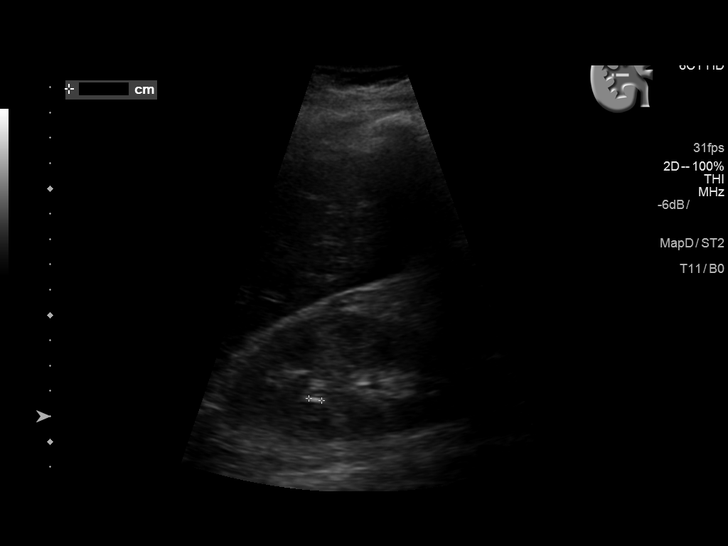
[im 23/62]
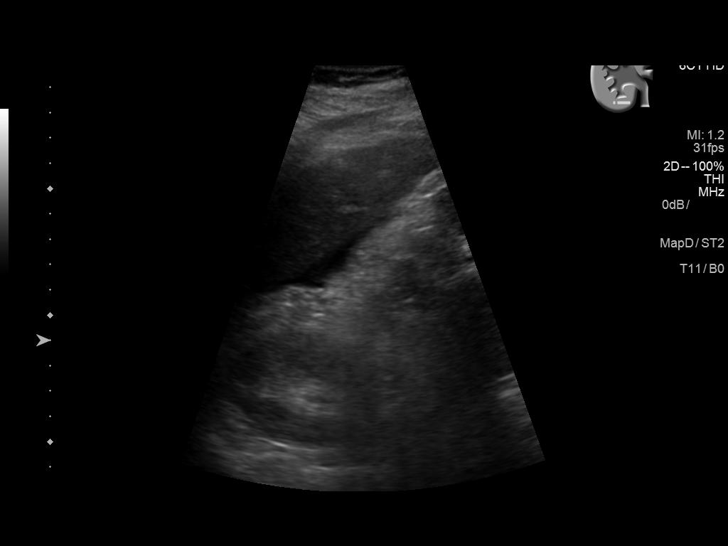
[im 28/62]
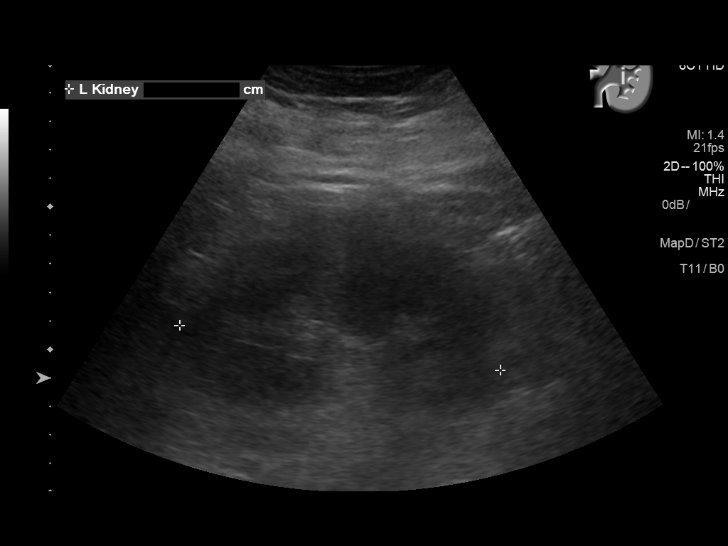
[im 34/62]
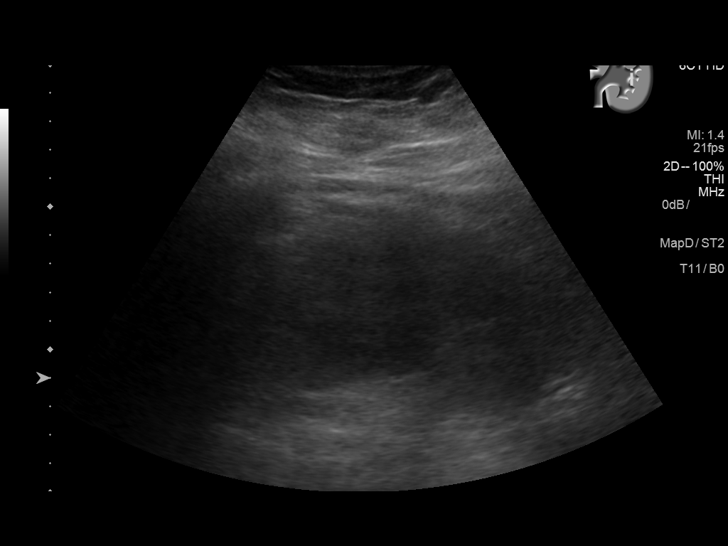
[im 39/62]
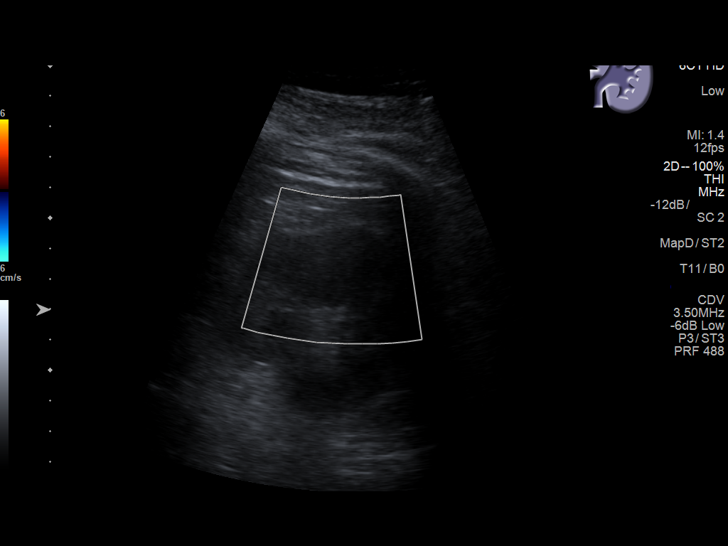
[im 41/62]
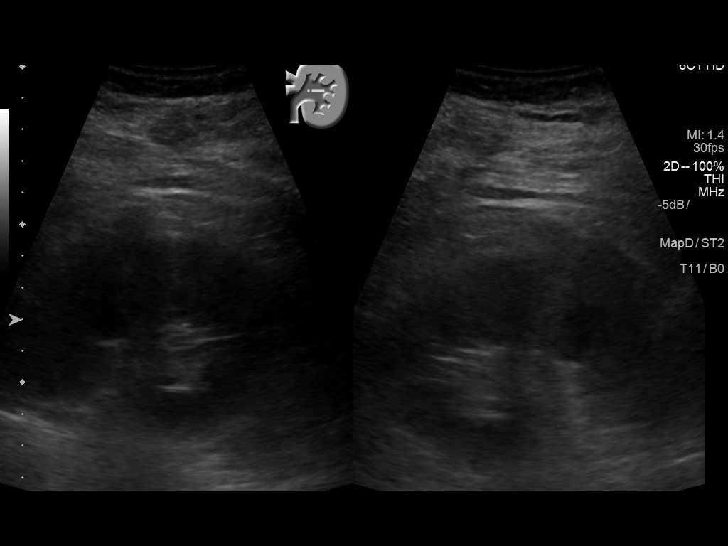
[im 46/62]
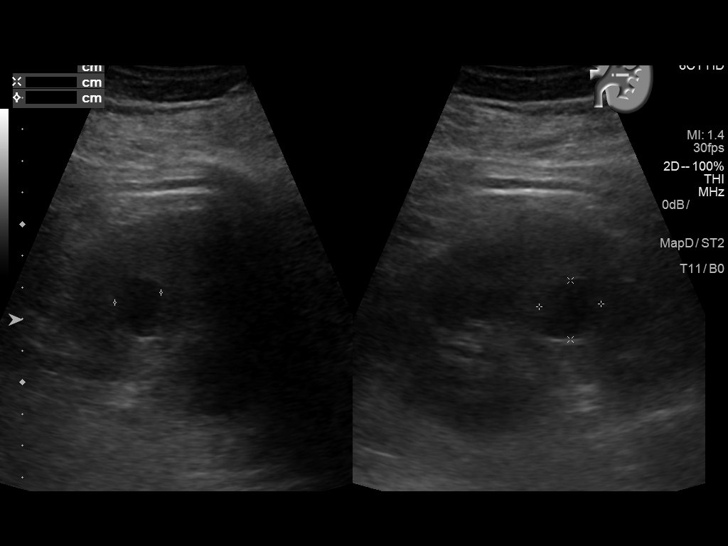
[im 51/62]
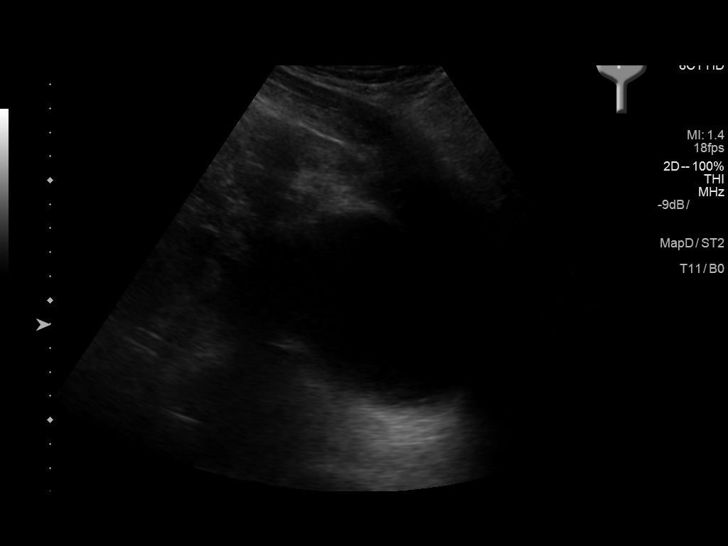
[im 56/62]
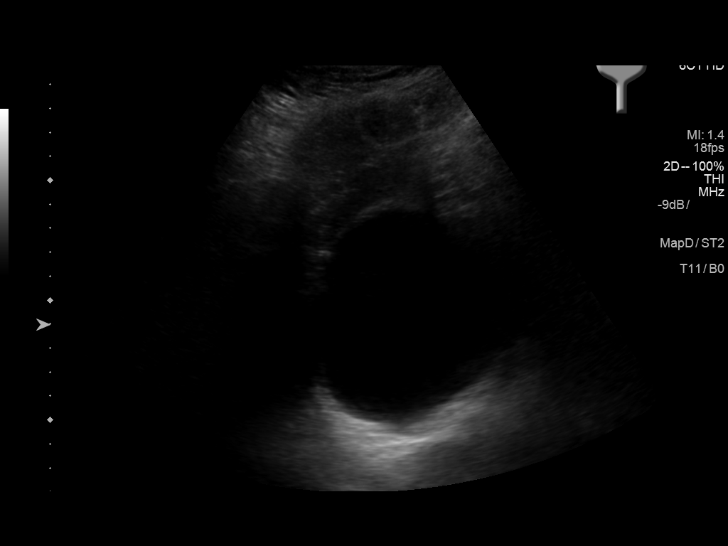
[im 62/62]
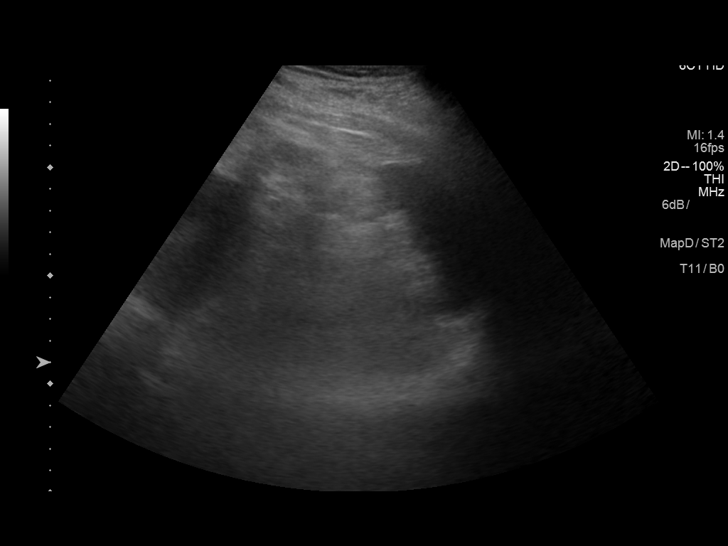

[14 of 25 positions shown; findings below may reference images not displayed]

FINDINGS: Right Kidney:

Length: 10.6 cm. 2.0 x 2.0 x 2.2 cm mildly complex cyst in the
interpolar region. 1.8 x 2.0 x 1.8 cm mildly complex cyst in the
upper pole. Neither demonstrate simple fluid density, and may be
hemorrhagic. No hydronephrosis.

Left Kidney:

Length: 11.4 cm. 2.0 x 1.9 x 1.5 cm simple cyst in the interpolar
region. 2.2 x 2.3 x 2.0 cm complex cyst in the upper pole, not
demonstrating simple fluid density, possibly hemorrhagic. No
hydronephrosis.

Bladder:

Within normal limits.
IMPRESSION: No hydronephrosis.

Multiple bilateral renal cysts, some of which do demonstrate simple
fluid density, possibly hemorrhagic.

## 2018-05-12 ENCOUNTER — Ambulatory Visit: Payer: Medicare HMO | Admitting: Physical Medicine & Rehabilitation

## 2018-05-18 ENCOUNTER — Other Ambulatory Visit: Payer: Self-pay | Admitting: Family Medicine

## 2018-06-22 ENCOUNTER — Ambulatory Visit: Payer: Medicare HMO | Admitting: Family Medicine

## 2018-06-27 NOTE — Telephone Encounter (Signed)
Request from patient 

## 2018-06-29 ENCOUNTER — Ambulatory Visit (INDEPENDENT_AMBULATORY_CARE_PROVIDER_SITE_OTHER): Payer: Commercial Managed Care - HMO | Admitting: Family Medicine

## 2018-06-29 ENCOUNTER — Encounter: Payer: Self-pay | Admitting: Family Medicine

## 2018-06-29 VITALS — BP 130/74 | HR 72 | Temp 97.9°F | Resp 14 | Ht 72.0 in | Wt 235.1 lb

## 2018-06-29 DIAGNOSIS — E669 Obesity, unspecified: Secondary | ICD-10-CM | POA: Diagnosis not present

## 2018-06-29 DIAGNOSIS — I10 Essential (primary) hypertension: Secondary | ICD-10-CM

## 2018-06-29 DIAGNOSIS — R7303 Prediabetes: Secondary | ICD-10-CM

## 2018-06-29 DIAGNOSIS — E785 Hyperlipidemia, unspecified: Secondary | ICD-10-CM

## 2018-06-29 DIAGNOSIS — E039 Hypothyroidism, unspecified: Secondary | ICD-10-CM

## 2018-06-29 DIAGNOSIS — Z5181 Encounter for therapeutic drug level monitoring: Secondary | ICD-10-CM | POA: Diagnosis not present

## 2018-06-29 DIAGNOSIS — Z1211 Encounter for screening for malignant neoplasm of colon: Secondary | ICD-10-CM

## 2018-06-29 DIAGNOSIS — G8110 Spastic hemiplegia affecting unspecified side: Secondary | ICD-10-CM

## 2018-06-29 DIAGNOSIS — I6523 Occlusion and stenosis of bilateral carotid arteries: Secondary | ICD-10-CM

## 2018-06-29 NOTE — Assessment & Plan Note (Signed)
Check lipids; limit saturated fats 

## 2018-06-29 NOTE — Assessment & Plan Note (Signed)
Check TSH; regular BMs

## 2018-06-29 NOTE — Assessment & Plan Note (Signed)
controlled 

## 2018-06-29 NOTE — Assessment & Plan Note (Signed)
Encouraged weight loss 

## 2018-06-29 NOTE — Assessment & Plan Note (Signed)
Sees vascular in July; goal LDL less than 70

## 2018-06-29 NOTE — Patient Instructions (Addendum)
Check out the information at familydoctor.org entitled "Nutrition for Weight Loss: What You Need to Know about Fad Diets" Try to lose between 1-2 pounds per week by taking in fewer calories and burning off more calories You can succeed by limiting portions, limiting foods dense in calories and fat, becoming more active, and drinking 8 glasses of water a day (64 ounces) Don't skip meals, especially breakfast, as skipping meals may alter your metabolism Do not use over-the-counter weight loss pills or gimmicks that claim rapid weight loss A healthy BMI (or body mass index) is between 18.5 and 24.9 You can calculate your ideal BMI at the NIH website JobEconomics.huhttp://www.nhlbi.nih.gov/health/educational/lose_wt/BMI/bmicalc.htm  Try to limit saturated fats in your diet (bologna, hot dogs, barbeque, cheeseburgers, hamburgers, steak, bacon, sausage, cheese, etc.) and get more fresh fruits, vegetables, and whole grains  Try to follow the DASH guidelines (DASH stands for Dietary Approaches to Stop Hypertension). Try to limit the sodium in your diet to no more than 1,500mg  of sodium per day. Certainly try to not exceed 2,000 mg per day at the very most. Do not add salt when cooking or at the table.  Check the sodium amount on labels when shopping, and choose items lower in sodium when given a choice. Avoid or limit foods that already contain a lot of sodium. Eat a diet rich in fruits and vegetables and whole grains, and try to lose weight if overweight or obese  Preventing Unhealthy Weight Gain, Adult Staying at a healthy weight is important to your overall health. When fat builds up in your body, you may become overweight or obese. Being overweight or obese increases your risk of developing certain health problems, such as heart disease, diabetes, sleeping problems, joint problems, and some types of cancer. Unhealthy weight gain is often the result of making unhealthy food choices or not getting enough exercise. You can  make changes to your lifestyle to prevent obesity and stay as healthy as possible. What nutrition changes can be made?   Eat only as much as your body needs. To do this: ? Pay attention to signs that you are hungry or full. Stop eating as soon as you feel full. ? If you feel hungry, try drinking water first before eating. Drink enough water so your urine is clear or pale yellow. ? Eat smaller portions. Pay attention to portion sizes when eating out. ? Look at serving sizes on food labels. Most foods contain more than one serving per container. ? Eat the recommended number of calories for your gender and activity level. For most active people, a daily total of 2,000 calories is appropriate. If you are trying to lose weight or are not very active, you may need to eat fewer calories. Talk with your health care provider or a diet and nutrition specialist (dietitian) about how many calories you need each day.  Choose healthy foods, such as: ? Fruits and vegetables. At each meal, try to fill at least half of your plate with fruits and vegetables. ? Whole grains, such as whole-wheat bread, brown rice, and quinoa. ? Lean meats, such as chicken or fish. ? Other healthy proteins, such as beans, eggs, or tofu. ? Healthy fats, such as nuts, seeds, fatty fish, and olive oil. ? Low-fat or fat-free dairy products.  Check food labels, and avoid food and drinks that: ? Are high in calories. ? Have added sugar. ? Are high in sodium. ? Have saturated fats or trans fats.  Cook foods in healthier ways,  such as by baking, broiling, or grilling.  Make a meal plan for the week, and shop with a grocery list to help you stay on track with your purchases. Try to avoid going to the grocery store when you are hungry.  When grocery shopping, try to shop around the outside of the store first, where the fresh foods are. Doing this helps you to avoid prepackaged foods, which can be high in sugar, salt (sodium), and  fat. What lifestyle changes can be made?   Exercise for 30 or more minutes on 5 or more days each week. Exercising may include brisk walking, yard work, biking, running, swimming, and team sports like basketball and soccer. Ask your health care provider which exercises are safe for you.  Do muscle-strengthening activities, such as lifting weights or using resistance bands, on 2 or more days a week.  Do not use any products that contain nicotine or tobacco, such as cigarettes and e-cigarettes. If you need help quitting, ask your health care provider.  Limit alcohol intake to no more than 1 drink a day for nonpregnant women and 2 drinks a day for men. One drink equals 12 oz of beer, 5 oz of wine, or 1 oz of hard liquor.  Try to get 7-9 hours of sleep each night. What other changes can be made?  Keep a food and activity journal to keep track of: ? What you ate and how many calories you had. Remember to count the calories in sauces, dressings, and side dishes. ? Whether you were active, and what exercises you did. ? Your calorie, weight, and activity goals.  Check your weight regularly. Track any changes. If you notice you have gained weight, make changes to your diet or activity routine.  Avoid taking weight-loss medicines or supplements. Talk to your health care provider before starting any new medicine or supplement.  Talk to your health care provider before trying any new diet or exercise plan. Why are these changes important? Eating healthy, staying active, and having healthy habits can help you to prevent obesity. Those changes also:  Help you manage stress and emotions.  Help you connect with friends and family.  Improve your self-esteem.  Improve your sleep.  Prevent long-term health problems. What can happen if changes are not made? Being obese or overweight can cause you to develop joint or bone problems, which can make it hard for you to stay active or do activities you  enjoy. Being obese or overweight also puts stress on your heart and lungs and can lead to health problems like diabetes, heart disease, and some cancers. Where to find more information Talk with your health care provider or a dietitian about healthy eating and healthy lifestyle choices. You may also find information from:  U.S. Department of Agriculture, MyPlate: https://ball-collins.biz/  American Heart Association: www.heart.org  Centers for Disease Control and Prevention: FootballExhibition.com.br Summary  Staying at a healthy weight is important to your overall health. It helps you to prevent certain diseases and health problems, such as heart disease, diabetes, joint problems, sleep disorders, and some types of cancer.  Being obese or overweight can cause you to develop joint or bone problems, which can make it hard for you to stay active or do activities you enjoy.  You can prevent unhealthy weight gain by eating a healthy diet, exercising regularly, not smoking, limiting alcohol, and getting enough sleep.  Talk with your health care provider or a dietitian for guidance about healthy eating and healthy  lifestyle choices. This information is not intended to replace advice given to you by your health care provider. Make sure you discuss any questions you have with your health care provider. Document Released: 05/11/2016 Document Revised: 02/18/2017 Document Reviewed: 06/16/2016 Elsevier Interactive Patient Education  2019 [employer].

## 2018-06-29 NOTE — Assessment & Plan Note (Signed)
Check liver and kidneys 

## 2018-06-29 NOTE — Progress Notes (Signed)
BP 130/74   Pulse 72   Temp 97.9 F (36.6 C) (Oral)   Resp 14   Ht 6' (1.829 m)   Wt 235 lb 1.6 oz (106.6 kg)   SpO2 99%   BMI 31.89 kg/m    Subjective:    Patient ID: [name], male    DOB: 03/25/1956, 63 y.o.   MRN: [id number]  HPI: [name] is a 63 y.o. male  Chief Complaint  Patient presents with  . Follow-up    HPI Patient is here for f/u  Hemiplegia (right side) following stroke Next injection coming Feb 20th; he gets some benefit, lasts about a month; does help some; exercises really help; his "arm and leg will tell ya, it's getting might tight"; walking and doing well; walking 1/2 mile, going to the park; wearing AFO brace, using bicycle  HTN; checking BP at home, 120/70 at home  Carotid athero; sees vascular in July  Obesity Losing weight; off of sweets now  HM list: had a colonoscopy years ago; he has been through surgeries; does not want a colonoscopy; does not want a big procedure; he is willing to do a Cologuard; ordered  Prediabetes; no dry mouth; no blurred vision after eating; mother never had diabetes; sisters don't have diabetes; maybe an uncle and aunt with diabetes; cutting out sweets; no sugary drinks; just just sugar-free packs; occasional glass of ginger ale  High cholesterol; avoiding fatty pig meat; just an occasional steak  Hypothyroidism; no hair loss, no dry skin, regular BMs Lab Results  Component Value Date   TSH 2.66 03/09/2017    [facility] 03/10/2018 12/19/2017 09/08/2017 07/28/2017 03/09/2017  Decreased Interest 0 0 0 0 0  Down, Depressed, Hopeless 0 0 0 0 0  PHQ - 2 Score 0 0 0 0 0  Altered sleeping - - - - -  Tired, decreased energy - - - - -  Change in appetite - - - - -  Feeling bad or failure about yourself  - - - - -  Trouble concentrating - - - - -  Moving slowly or fidgety/restless - - - - -  Suicidal thoughts - - - - -  PHQ-9 Score - - - - -   Fall Risk  06/29/2018 03/31/2018 03/10/2018  12/19/2017 10/11/2017  Falls in the past year? 0 0 No No No  Number falls in past yr: 0 - - - -  Injury with Fall? 0 - - - -  Risk for fall due to : - - - - -    Relevant past medical, surgical, family and social history reviewed Past Medical History:  Diagnosis Date  . AAA (abdominal aortic aneurysm) (HCC)   . Bowel obstruction (HCC) 2016   during septic event. no further a problem once sepsis resolved  . Chronic kidney disease 2016   happened after stroke. dr. Cherylann Ratellateef follows him once a year  . Hyperlipidemia   . Hypertension   . Inguinal hernia   . Mild atherosclerosis of carotid artery 12/2017   bilateral stenosis per vascular surgeon  . Pink eye 06/21/2016  . S/P Botox injection    every 3 months into right arm to loosen up muscles  . Sepsis (HCC) 2016   happened after stroke. unsure when,but kidney disease started. followed by dr. Cherylann Ratellateef  . Stroke [facility](HCC) 12/24/2013   right side weakness   Past Surgical History:  Procedure Laterality Date  . HYDROCELE EXCISION Left 01/18/2018   Procedure:  HYDROCELECTOMY ADULT;  Surgeon: Vanna Scotland, MD;  Location: [facility] ORS;  Service: Urology;  Laterality: Left;  . INGUINAL HERNIA REPAIR Bilateral 06/25/2016   Procedure: HERNIA REPAIR INGUINAL ADULT BILATERAL;  Surgeon: Kieth Brightly, MD;  Location: [facility] ORS;  Service: General;  Laterality: Bilateral;  . KNEE ARTHROSCOPY Right 1970's   Family History  Problem Relation Age of Onset  . Hypertension Mother   . Hernia Sister   . Prostate cancer Neg Hx   . Bladder Cancer Neg Hx   . Kidney cancer Neg Hx    Social History   Tobacco Use  . Smoking status: Never Smoker  . Smokeless tobacco: Never Used  Substance Use Topics  . Alcohol use: No    Alcohol/week: 0.0 standard drinks  . Drug use: No    Comment: used to use THC in the late 1980s     [facility] Visit from 06/29/2018 in [facility]  AUDIT-C Score  0      Interim medical history since last visit  reviewed. Allergies and medications reviewed  Review of Systems Per HPI unless specifically indicated above     Objective:    BP 130/74   Pulse 72   Temp 97.9 F (36.6 C) (Oral)   Resp 14   Ht 6' (1.829 m)   Wt 235 lb 1.6 oz (106.6 kg)   SpO2 99%   BMI 31.89 kg/m   Wt Readings from Last 3 Encounters:  06/29/18 235 lb 1.6 oz (106.6 kg)  03/31/18 230 lb (104.3 kg)  03/10/18 230 lb 12.8 oz (104.7 kg)    Physical Exam Constitutional:      General: He is not in acute distress.    Appearance: He is well-developed. He is obese.  HENT:     Head: Normocephalic and atraumatic.  Eyes:     General: No scleral icterus. Neck:     Thyroid: No thyromegaly.  Cardiovascular:     Rate and Rhythm: Normal rate and regular rhythm.  Pulmonary:     Effort: Pulmonary effort is normal.     Breath sounds: Normal breath sounds.  Abdominal:     General: Bowel sounds are normal. There is no distension.     Palpations: Abdomen is soft.  Skin:    General: Skin is warm and dry.     Coloration: Skin is not pale.  Neurological:     Mental Status: He is alert.     Comments: Contracture right upper extremity; AFO on the right; weakness RUE and RLE  Psychiatric:        Mood and Affect: Mood normal.        Behavior: Behavior normal.        Thought Content: Thought content normal.        Judgment: Judgment normal.       Assessment & Plan:   Problem List Items Addressed This Visit      Cardiovascular and Mediastinum   Hypertension goal BP (blood pressure) < 140/90    controlled      Atherosclerosis of both carotid arteries    Sees vascular in July; goal LDL less than 70        Endocrine   Hypothyroidism    Check TSH; regular BMs      Relevant Orders   TSH     Nervous and Auditory   Spastic hemiplegia affecting dominant side (HCC)    Doing therapy and exercises, Botox every 3 months  Other   Pre-diabetes    Check A1c and glucose      Relevant Orders   Hemoglobin  A1c   Obesity (BMI 30.0-34.9)    Encouraged weight loss      Medication monitoring encounter    Check liver and kidneys      Relevant Orders   COMPLETE METABOLIC PANEL WITH GFR   Hyperlipidemia LDL goal <70 - Primary    Check lipids; limit saturated fats      Relevant Orders   Lipid panel    Other Visit Diagnoses    Screen for colon cancer       Relevant Orders   Cologuard       Follow up plan: Return in about 6 months (around 12/28/2018) for follow-up visit with Dr. Sherie DonLada (or just after).  An after-visit summary was printed and given to the patient at check-out.  Please see the patient instructions which may contain other information and recommendations beyond what is mentioned above in the assessment and plan.  No orders of the defined types were placed in this encounter.   Orders Placed This Encounter  Procedures  . Cologuard  . Lipid panel  . Hemoglobin A1c  . COMPLETE METABOLIC PANEL WITH GFR  . TSH

## 2018-06-29 NOTE — Assessment & Plan Note (Signed)
Doing therapy and exercises, Botox every 3 months

## 2018-06-29 NOTE — Assessment & Plan Note (Signed)
Check A1c and glucose 

## 2018-06-30 LAB — COMPLETE METABOLIC PANEL WITH GFR
AG RATIO: 1.4 (calc) (ref 1.0–2.5)
ALT: 32 U/L (ref 9–46)
AST: 25 U/L (ref 10–35)
Albumin: 4.1 g/dL (ref 3.6–5.1)
Alkaline phosphatase (APISO): 72 U/L (ref 35–144)
BUN: 14 mg/dL (ref 7–25)
CALCIUM: 9.4 mg/dL (ref 8.6–10.3)
CO2: 28 mmol/L (ref 20–32)
Chloride: 104 mmol/L (ref 98–110)
Creat: 1.06 mg/dL (ref 0.70–1.25)
GFR, EST NON AFRICAN AMERICAN: 74 mL/min/{1.73_m2} (ref >=60)
GFR, Est African American: 86 mL/min/{1.73_m2} (ref >=60)
GLUCOSE: 93 mg/dL (ref 65–99)
Globulin: 3 g/dL (calc) (ref 1.9–3.7)
POTASSIUM: 4.7 mmol/L (ref 3.5–5.3)
Sodium: 139 mmol/L (ref 135–146)
Total Bilirubin: 0.5 mg/dL (ref 0.2–1.2)
Total Protein: 7.1 g/dL (ref 6.1–8.1)

## 2018-06-30 LAB — TSH: TSH: 2.77 m[IU]/L (ref 0.40–4.50)

## 2018-06-30 LAB — LIPID PANEL
CHOL/HDL RATIO: 3.3 (calc) (ref <5.0)
CHOLESTEROL: 156 mg/dL (ref <200)
HDL: 48 mg/dL (ref >=40)
LDL CHOLESTEROL (CALC): 94 mg/dL
Non-HDL Cholesterol (Calc): 108 mg/dL (calc) (ref <130)
Triglycerides: 46 mg/dL (ref <150)

## 2018-06-30 LAB — HEMOGLOBIN A1C
EAG (MMOL/L): 7.6 (calc)
Hgb A1c MFr Bld: 6.4 % of total Hgb — ABNORMAL HIGH (ref <5.7)
Mean Plasma Glucose: 137 (calc)

## 2018-07-13 ENCOUNTER — Ambulatory Visit: Payer: Medicare Other | Admitting: Physical Medicine & Rehabilitation

## 2018-07-13 ENCOUNTER — Encounter: Payer: Self-pay | Admitting: Physical Medicine & Rehabilitation

## 2018-07-13 ENCOUNTER — Encounter: Payer: Medicare Other | Attending: Physical Medicine & Rehabilitation

## 2018-07-13 VITALS — BP 151/82 | HR 66 | Ht 71.5 in | Wt 234.0 lb

## 2018-07-13 DIAGNOSIS — G8111 Spastic hemiplegia affecting right dominant side: Secondary | ICD-10-CM | POA: Insufficient documentation

## 2018-07-13 DIAGNOSIS — G8110 Spastic hemiplegia affecting unspecified side: Secondary | ICD-10-CM | POA: Diagnosis not present

## 2018-07-13 NOTE — Progress Notes (Signed)
Botox Injection for spasticity using needle EMG guidance  Dilution: 50 Units/ml Indication: Severe spasticity which interferes with ADL,mobility and/or  hygiene and is unresponsive to medication management and other conservative care Informed consent was obtained after describing risks and benefits of the procedure with the patient. This includes bleeding, bruising, infection, excessive weakness, or medication side effects. A REMS form is on file and signed. Needle: 27g 1" needle electrode Number of units per muscle Biceps125 Brachialis 125 FCR25 FCU0 FDS25 FDP50 PT50  All injections were done after obtaining appropriate EMG activity and after negative drawback for blood. The patient tolerated the procedure well. Post procedure instructions were given. A followup appointment was made.

## 2018-07-13 NOTE — Patient Instructions (Signed)

## 2018-10-12 ENCOUNTER — Other Ambulatory Visit: Payer: Self-pay

## 2018-10-12 ENCOUNTER — Encounter: Payer: Medicare Other | Admitting: Physical Medicine & Rehabilitation

## 2018-10-12 ENCOUNTER — Encounter: Payer: Self-pay | Admitting: Family Medicine

## 2018-10-12 ENCOUNTER — Other Ambulatory Visit: Payer: Self-pay | Admitting: Nurse Practitioner

## 2018-10-12 MED ORDER — AMLODIPINE BESYLATE 10 MG PO TABS: 10.0000 mg | ORAL_TABLET | Freq: Every day | ORAL | 1 refills | Status: DC

## 2018-10-12 MED ORDER — CARVEDILOL 6.25 MG PO TABS: 6.2500 mg | ORAL_TABLET | Freq: Two times a day (BID) | ORAL | 1 refills | Status: DC

## 2018-10-12 MED ORDER — ROSUVASTATIN CALCIUM 40 MG PO TABS: 40.0000 mg | ORAL_TABLET | Freq: Every day | ORAL | 1 refills | Status: DC

## 2018-10-12 MED ORDER — LOSARTAN POTASSIUM 100 MG PO TABS: 100.0000 mg | ORAL_TABLET | Freq: Every day | ORAL | 1 refills | Status: DC

## 2018-10-12 NOTE — Patient Instructions (Signed)

## 2018-10-12 NOTE — Progress Notes (Deleted)
Botox Injection for spasticity using needle EMG guidance  Dilution: 50 Units/ml Indication: Severe spasticity which interferes with ADL,mobility and/or  hygiene and is unresponsive to medication management and other conservative care Informed consent was obtained after describing risks and benefits of the procedure with the patient. This includes bleeding, bruising, infection, excessive weakness, or medication side effects. A REMS form is on file and signed. Needle: 27g 1" needle electrode Number of units per muscle Biceps125 Brachialis 125 FCR25  FDS25 FDP50 PT50  All injections were done after obtaining appropriate EMG activity and after negative drawback for blood. The patient tolerated the procedure well. Post procedure instructions were given. A followup appointment was made.  

## 2018-10-17 IMAGING — DX DG CHEST 1V PORT
1 series · 1 of 1 positions shown · non-contrast
Comparison: 05/11/2016

CLINICAL DATA: Central line placement.

EXAM:
PORTABLE CHEST 1 VIEW

[chest ap]
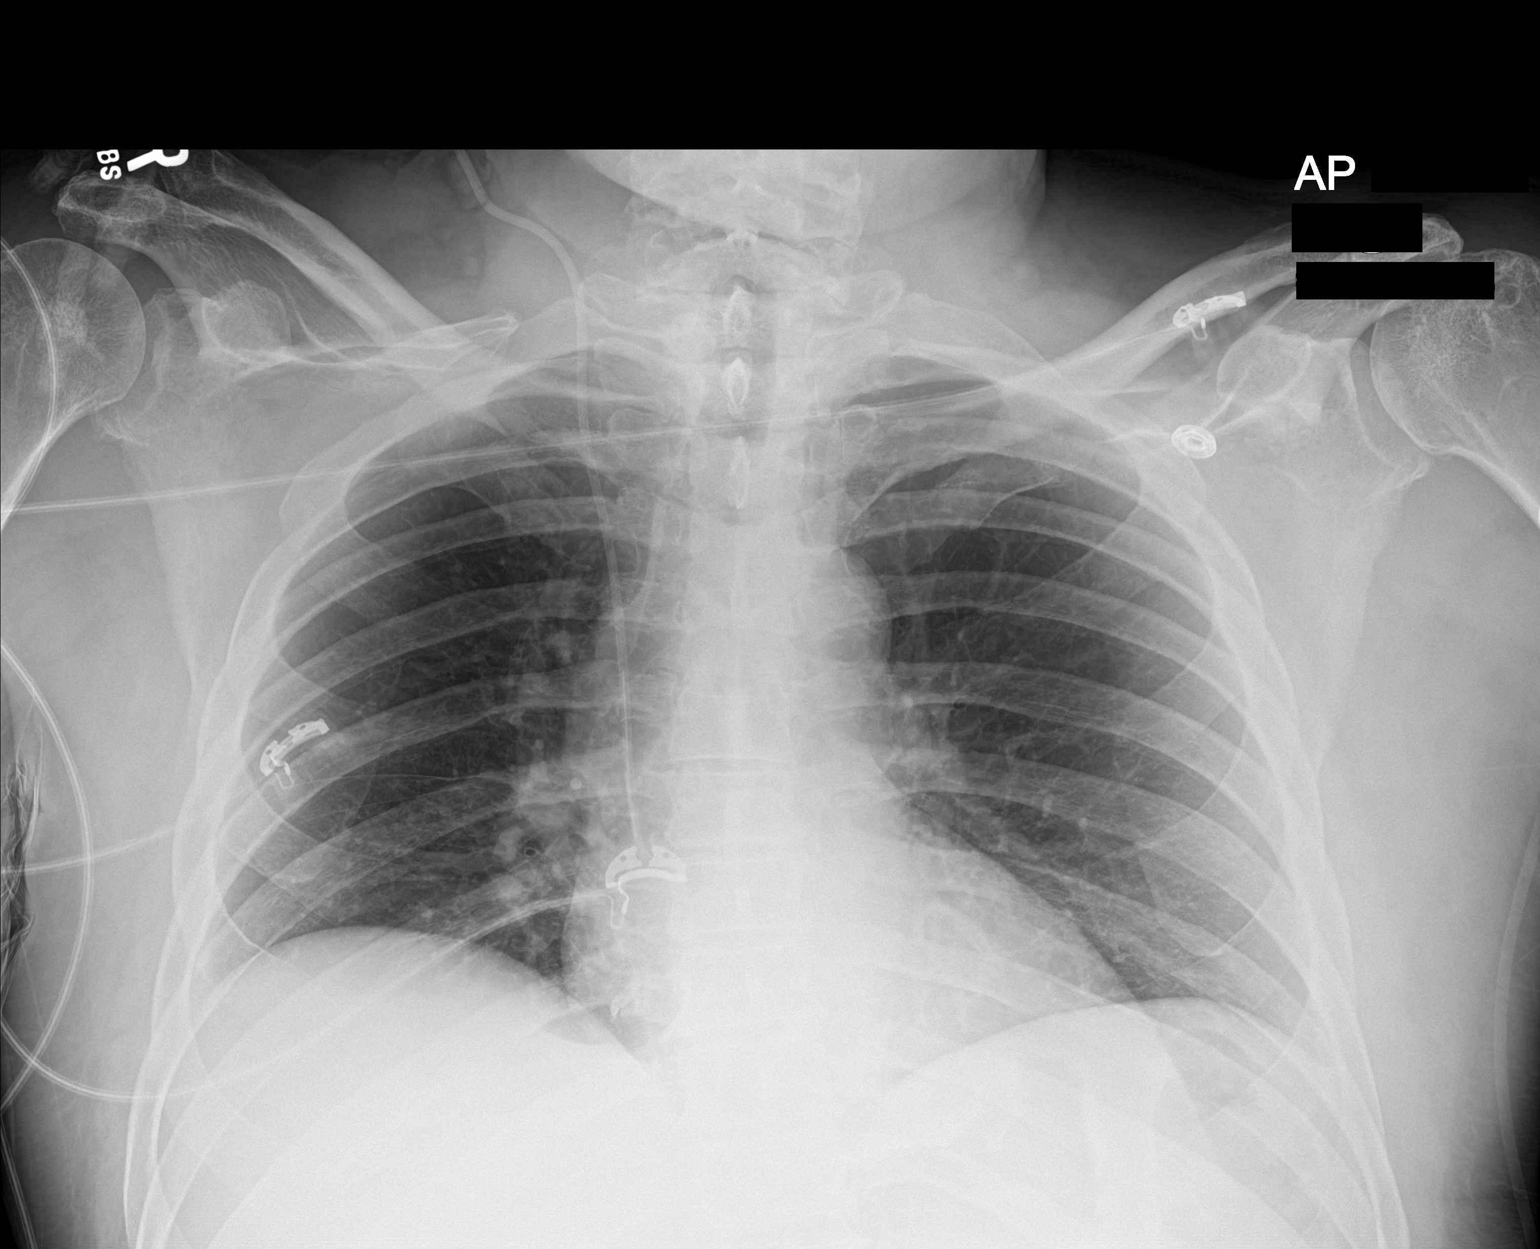

[1 of 1 positions shown; findings below may reference images not displayed]

FINDINGS: Right jugular catheter has been placed and terminates near the
cavoatrial junction. The cardiomediastinal silhouette is unchanged.
The lungs are hypoinflated without evidence of airspace
consolidation, edema, pleural effusion, or pneumothorax. No acute
osseous abnormality is identified.
IMPRESSION: Right jugular catheter terminating near the cavoatrial junction.

## 2018-10-31 ENCOUNTER — Other Ambulatory Visit: Payer: Self-pay

## 2018-10-31 ENCOUNTER — Encounter: Payer: Medicare Other | Attending: Physical Medicine & Rehabilitation | Admitting: Physical Medicine & Rehabilitation

## 2018-10-31 ENCOUNTER — Encounter: Payer: Self-pay | Admitting: Physical Medicine & Rehabilitation

## 2018-10-31 VITALS — BP 159/87 | HR 73 | Resp 14 | Ht 71.5 in | Wt 230.0 lb

## 2018-10-31 DIAGNOSIS — G8110 Spastic hemiplegia affecting unspecified side: Secondary | ICD-10-CM | POA: Insufficient documentation

## 2018-10-31 NOTE — Progress Notes (Signed)
Botox Injection for spasticity using needle EMG guidance  Dilution: 50 Units/ml Indication: Severe spasticity which interferes with ADL,mobility and/or  hygiene and is unresponsive to medication management and other conservative care Informed consent was obtained after describing risks and benefits of the procedure with the patient. This includes bleeding, bruising, infection, excessive weakness, or medication side effects. A REMS form is on file and signed. Needle: 27g 1" needle electrode Number of units per muscle Biceps125 Brachialis 125 FCR25  FDS25 FDP50 PT50  All injections were done after obtaining appropriate EMG activity and after negative drawback for blood. The patient tolerated the procedure well. Post procedure instructions were given. A followup appointment was made.

## 2018-10-31 NOTE — Patient Instructions (Signed)

## 2018-12-08 ENCOUNTER — Ambulatory Visit (INDEPENDENT_AMBULATORY_CARE_PROVIDER_SITE_OTHER): Payer: Medicare HMO | Admitting: Vascular Surgery

## 2018-12-08 ENCOUNTER — Encounter (INDEPENDENT_AMBULATORY_CARE_PROVIDER_SITE_OTHER): Payer: Medicare HMO

## 2018-12-28 ENCOUNTER — Ambulatory Visit: Payer: Commercial Managed Care - HMO | Admitting: Family Medicine

## 2019-01-02 ENCOUNTER — Ambulatory Visit: Payer: Commercial Managed Care - HMO | Admitting: Nurse Practitioner

## 2019-01-10 ENCOUNTER — Other Ambulatory Visit: Payer: Self-pay

## 2019-01-10 ENCOUNTER — Encounter: Payer: Self-pay | Admitting: Nurse Practitioner

## 2019-01-10 ENCOUNTER — Ambulatory Visit (INDEPENDENT_AMBULATORY_CARE_PROVIDER_SITE_OTHER): Payer: Medicare Other | Admitting: Nurse Practitioner

## 2019-01-10 VITALS — BP 122/77 | HR 65 | Resp 16

## 2019-01-10 DIAGNOSIS — I10 Essential (primary) hypertension: Secondary | ICD-10-CM | POA: Diagnosis not present

## 2019-01-10 DIAGNOSIS — G8110 Spastic hemiplegia affecting unspecified side: Secondary | ICD-10-CM | POA: Diagnosis not present

## 2019-01-10 DIAGNOSIS — E039 Hypothyroidism, unspecified: Secondary | ICD-10-CM | POA: Diagnosis not present

## 2019-01-10 DIAGNOSIS — I700 Atherosclerosis of aorta: Secondary | ICD-10-CM | POA: Diagnosis not present

## 2019-01-10 DIAGNOSIS — E785 Hyperlipidemia, unspecified: Secondary | ICD-10-CM

## 2019-01-10 MED ORDER — ROSUVASTATIN CALCIUM 40 MG PO TABS: 40.0000 mg | ORAL_TABLET | Freq: Every day | ORAL | 1 refills | Status: DC

## 2019-01-10 MED ORDER — CARVEDILOL 6.25 MG PO TABS: 6.2500 mg | ORAL_TABLET | Freq: Two times a day (BID) | ORAL | 1 refills | Status: DC

## 2019-01-10 MED ORDER — AMLODIPINE BESYLATE 10 MG PO TABS: 10.0000 mg | ORAL_TABLET | Freq: Every day | ORAL | 1 refills | Status: DC

## 2019-01-10 MED ORDER — LOSARTAN POTASSIUM 100 MG PO TABS: 100.0000 mg | ORAL_TABLET | Freq: Every day | ORAL | 1 refills | Status: DC

## 2019-01-10 NOTE — Progress Notes (Signed)
Virtual Visit via Video Note  I connected with [name] on 01/10/19 at  2:00 PM EDT by a video enabled telemedicine application and verified that I am speaking with the correct person using two identifiers.   Staff discussed the limitations of evaluation and management by telemedicine and the availability of in person appointments. The patient expressed understanding and agreed to proceed.  Patient location: home  My location: work office Other people present:  none HPI   Hypertension Patient is on amlodipine 10 mg daily, and losartan 100 mg daily, carvedilol 6.25 mg twice daily and he takes clonidine 0.1 mg as needed for hypertension.  Takes medications as prescribed with no missed doses a month.  He iscompliant with low-salt diet.  He checks blood pressures at home with range of 120-130 systolic.  Denies chest pain, headaches, blurry vision. Cardiologist: Dr. Allyson SabalBerry; Nephrologist manages BP- Dr. Cherylann RatelLateef BP Readings from Last 3 Encounters:  01/10/19 122/77  10/31/18 (!) 159/87  07/13/18 (!) 151/82    Hyperlipidemia  He is prescribed Crestor 40 mg at bedtime as well as fish oils 1000 mg daily.  He has taken this as prescribed with no missed doses. Abdominal aortic atherosclerosis & atherosclerosis of both carotid arteries.  He has had a stroke with residual spastic right hemiplegia.  He is presently seeing physical therapy for this on 10/31/2018 he received Botox injections for spasticity.  He has some improvement. He additionally takes a 325 mg enteric-coated aspirin daily. Vascular Surgeon: Dr. Wyn Quakerew Does not eat red meats, eats chicken.  Lab Results  Component Value Date   CHOL 156 06/29/2018   HDL 48 06/29/2018   LDLCALC 94 06/29/2018   TRIG 46 06/29/2018   CHOLHDL 3.3 06/29/2018    Hypothyroidism He is not on medications  Patient denies fatigue/palpitations, insomnia, constipation/diarrhea, hot/cold intolerances,   Lab Results  Component Value Date   TSH 2.77  06/29/2018     PHQ2/9: [facility] 01/10/2019 03/10/2018 12/19/2017 09/08/2017 07/28/2017  Decreased Interest 0 0 0 0 0  Down, Depressed, Hopeless 0 0 0 0 0  PHQ - 2 Score 0 0 0 0 0  Altered sleeping 0 - - - -  Tired, decreased energy 0 - - - -  Change in appetite 0 - - - -  Feeling bad or failure about yourself  0 - - - -  Trouble concentrating 0 - - - -  Moving slowly or fidgety/restless 0 - - - -  Suicidal thoughts 0 - - - -  PHQ-9 Score 0 - - - -  Difficult doing work/chores Not difficult at all - - - -     PHQ reviewed. Negative  Patient Active Problem List   Diagnosis Date Noted  . Obesity (BMI 30.0-34.9) 03/10/2018  . Abnormal EKG 12/15/2017  . Hypothyroidism 03/09/2017  . Abdominal aortic atherosclerosis (HCC) 05/20/2016  . Pre-diabetes 02/18/2015  . Atherosclerosis of both carotid arteries 02/18/2015  . 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 Shoulder subluxation, right 04/23/2014  . Spastic hemiplegia affecting dominant side (HCC) 02/18/2014  . Hypertension goal BP (blood pressure) < 140/90 01/17/2014  . Hyperlipidemia LDL goal <70 01/17/2014    Past Medical History:  Diagnosis Date  . AAA (abdominal aortic aneurysm) (HCC)   . Bowel obstruction (HCC) 2016   during septic event. no further a problem once sepsis resolved  . 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 Chronic kidney disease 2016  happened after stroke. dr. Cherylann Ratellateef follows him once a year  . Hyperlipidemia   . Hypertension   . Inguinal hernia   . Mild atherosclerosis of carotid artery 12/2017   bilateral stenosis per vascular surgeon  . Pink eye 06/21/2016  . S/P Botox injection    every 3 months into right arm to loosen up muscles  . Sepsis (HCC) 2016   happened after stroke. unsure when,but kidney disease started. followed by dr. Cherylann Ratellateef  . Stroke [facility](HCC) 12/24/2013   right side weakness    Past Surgical History:  Procedure  Laterality Date  . HYDROCELE EXCISION Left 01/18/2018   Procedure: HYDROCELECTOMY ADULT;  Surgeon: Vanna ScotlandBrandon, Ashley, MD;  Location: [facility] ORS;  Service: Urology;  Laterality: Left;  . INGUINAL HERNIA REPAIR Bilateral 06/25/2016   Procedure: HERNIA REPAIR INGUINAL ADULT BILATERAL;  Surgeon: Kieth BrightlySeeplaputhur G Sankar, MD;  Location: [facility] ORS;  Service: General;  Laterality: Bilateral;  . KNEE ARTHROSCOPY Right 1970's    Social History   Tobacco Use  . Smoking status: Never Smoker  . Smokeless tobacco: Never Used  Substance Use Topics  . Alcohol use: No    Alcohol/week: 0.0 standard drinks     Current Outpatient Medications:  .  acetaminophen (TYLENOL) 500 MG tablet, Take 1,000 mg by mouth daily as needed for moderate pain., Disp: , Rfl:  .  amLODipine (NORVASC) 10 MG tablet, Take 1 tablet (10 mg total) by mouth daily., Disp: 90 tablet, Rfl: 1 .  aspirin EC 325 MG tablet, TAKE 1 TABLET (325 MG TOTAL) BY MOUTH DAILY., Disp: 90 tablet, Rfl: 3 .  carvedilol (COREG) 6.25 MG tablet, Take 1 tablet (6.25 mg total) by mouth 2 (two) times daily with a meal., Disp: 180 tablet, Rfl: 1 .  losartan (COZAAR) 100 MG tablet, Take 1 tablet (100 mg total) by mouth daily., Disp: 90 tablet, Rfl: 1 .  multivitamin (ONE-A-DAY MEN'S) TABS tablet, Take by mouth., Disp: , Rfl:  .  Naphazoline-Hypromellose (TGT LUBRICANT REDNESS RELIEVER OP), Place 1 drop into both eyes daily as needed (redness/ dryness)., Disp: , Rfl:  .  Omega-3 Fatty Acids (FISH OIL) 1000 MG CAPS, Take 1,000 mg by mouth daily., Disp: , Rfl:  .  rosuvastatin (CRESTOR) 40 MG tablet, Take 1 tablet (40 mg total) by mouth at bedtime. For cholesterol, Disp: 90 tablet, Rfl: 1 .  cloNIDine (CATAPRES) 0.1 MG tablet, Take 0.1 mg by mouth as needed. , Disp: , Rfl:  .  Multiple Vitamins-Minerals (ONE-A-DAY MENS HEALTH FORMULA) TABS, Take 1 tablet by mouth daily., Disp: , Rfl:   No Known Allergies  ROS   No other specific complaints in a complete review of  systems (except as listed in HPI above).  Objective  Vitals:   01/10/19 1221  BP: 122/77  Pulse: 65  Resp: 16    There is no height or weight on file to calculate BMI.  Nursing Note and Vital Signs reviewed.  Physical Exam  Constitutional: Patient appears well-developed and well-nourished. No distress.  HENT: Head: Normocephalic and atraumatic. Cardiovascular: Normal rate Pulmonary/Chest: Effort normal  Musculoskeletal: Normal range of motion,  Neurological: alert and oriented, speech normal.  Skin: No rash noted. No erythema.  Psychiatric: Patient has a normal mood and affect. behavior is normal. Judgment and thought content normal.    Assessment & Plan  1. Abdominal aortic atherosclerosis (HCC) - rosuvastatin (CRESTOR) 40 MG tablet; Take 1 tablet (40 mg total) by mouth at bedtime. For cholesterol  Dispense: 90 tablet; Refill: 1  2. Hypertension goal BP (blood pressure) < 140/90 - carvedilol (COREG) 6.25 MG tablet; Take 1 tablet (6.25 mg total) by mouth 2 (two) times daily with a meal.  Dispense: 180 tablet; Refill: 1 - losartan (COZAAR) 100 MG tablet; Take 1 tablet (100 mg total) by mouth daily.  Dispense: 90 tablet; Refill: 1 - amLODipine (NORVASC) 10 MG tablet; Take 1 tablet (10 mg total) by mouth daily.  Dispense: 90 tablet; Refill: 1  3. Hypothyroidism, unspecified type Last TSH was normal  4. Spastic hemiplegia affecting dominant side (HCC) Improving   5. Hyperlipidemia LDL goal <70 - rosuvastatin (CRESTOR) 40 MG tablet; Take 1 tablet (40 mg total) by mouth at bedtime. For cholesterol  Dispense: 90 tablet; Refill: 1     Follow Up Instructions:  6 months routine.   I discussed the assessment and treatment plan with the patient. The patient was provided an opportunity to ask questions and all were answered. The patient agreed with the plan and demonstrated an understanding of the instructions.   The patient was advised to call back or seek an in-person  evaluation if the symptoms worsen or if the condition fails to improve as anticipated.  I provided 16 minutes of non-face-to-face time during this encounter.   Fredderick Severance, NP

## 2019-01-31 DIAGNOSIS — H2513 Age-related nuclear cataract, bilateral: Secondary | ICD-10-CM | POA: Diagnosis not present

## 2019-02-08 ENCOUNTER — Other Ambulatory Visit: Payer: Self-pay

## 2019-02-08 ENCOUNTER — Encounter: Payer: Self-pay | Admitting: Physical Medicine & Rehabilitation

## 2019-02-08 ENCOUNTER — Encounter: Payer: Medicare Other | Attending: Physical Medicine & Rehabilitation | Admitting: Physical Medicine & Rehabilitation

## 2019-02-08 VITALS — BP 162/77 | HR 67 | Temp 97.2°F | Ht 71.5 in | Wt 218.0 lb

## 2019-02-08 DIAGNOSIS — G8110 Spastic hemiplegia affecting unspecified side: Secondary | ICD-10-CM | POA: Diagnosis not present

## 2019-02-08 NOTE — Progress Notes (Signed)
Botox Injection for spasticity using needle EMG guidance  Dilution: 50 Units/ml Indication: Severe spasticity which interferes with ADL,mobility and/or  hygiene and is unresponsive to medication management and other conservative care Informed consent was obtained after describing risks and benefits of the procedure with the patient. This includes bleeding, bruising, infection, excessive weakness, or medication side effects. A REMS form is on file and signed. Needle: 27g 1" needle electrode Number of units per muscle Biceps100 Brachialis 100  Brachioradialis 50U  FCR25  FDS25 FDP25 PT50 PQ 25 All injections were done after obtaining appropriate EMG activity and after negative drawback for blood. The patient tolerated the procedure well. Post procedure instructions were given. A followup appointment was made.  

## 2019-02-08 NOTE — Patient Instructions (Signed)

## 2019-02-15 ENCOUNTER — Encounter: Payer: Self-pay | Admitting: Family Medicine

## 2019-03-14 ENCOUNTER — Ambulatory Visit (INDEPENDENT_AMBULATORY_CARE_PROVIDER_SITE_OTHER): Payer: Medicare Other

## 2019-03-14 ENCOUNTER — Other Ambulatory Visit: Payer: Self-pay

## 2019-03-14 DIAGNOSIS — Z23 Encounter for immunization: Secondary | ICD-10-CM

## 2019-03-29 ENCOUNTER — Telehealth: Payer: Self-pay | Admitting: Family Medicine

## 2019-03-29 NOTE — Chronic Care Management (AMB) (Signed)
Chronic Care Management   Note  03/29/2019 Name: [name] MRN: [id number] DOB: September 14, 1955  [name] is a 63 y.o. year old male who is a primary care patient of Lada, Satira Anis, MD. I reached out to [name] by phone today in response to a referral sent by [name]'s health plan.     [name] was given information about Chronic Care Management services today including:  1. CCM service includes personalized support from designated clinical staff supervised by his physician, including individualized plan of care and coordination with other care providers 2. 24/7 contact phone numbers for assistance for urgent and routine care needs. 3. Service will only be billed when office clinical staff spend 20 minutes or more in a month to coordinate care. 4. Only one practitioner may furnish and bill the service in a calendar month. 5. The patient may stop CCM services at any time (effective at the end of the month) by phone call to the office staff. 6. The patient will be responsible for cost sharing (co-pay) of up to 20% of the service fee (after annual deductible is met).  Patient agreed to services and verbal consent obtained.   Follow up plan: Telephone appointment with CCM team member scheduled for: 04/23/2019  [place]  ??[email or web address]   ??[phone]

## 2019-04-06 ENCOUNTER — Ambulatory Visit (INDEPENDENT_AMBULATORY_CARE_PROVIDER_SITE_OTHER): Payer: Medicare Other

## 2019-04-06 VITALS — BP 127/77 | HR 72 | Ht 72.0 in | Wt 218.0 lb

## 2019-04-06 DIAGNOSIS — Z0000 Encounter for general adult medical examination without abnormal findings: Secondary | ICD-10-CM

## 2019-04-06 NOTE — Patient Instructions (Signed)
[name] , Thank you for taking time to come for your Medicare Wellness Visit. I appreciate your ongoing commitment to your health goals. Please review the following plan we discussed and let me know if I can assist you in the future.   Screening recommendations/referrals: Colonoscopy: postponed Recommended yearly ophthalmology/optometry visit for glaucoma screening and checkup Recommended yearly dental visit for hygiene and checkup  Vaccinations: Influenza vaccine: done 03/14/19 Pneumococcal vaccine: due Tdap vaccine: done 07/21/15 Shingles vaccine: Shingrix discussed. Please contact your pharmacy for coverage information.   Advanced directives: Please bring a copy of your health care power of attorney and living will to the office at your convenience.  Conditions/risks identified: Recommend healthy eating and physical activity for desired weight loss  Next appointment: Please follow up in one year for your Medicare Annual Wellness visit.    Preventive Care 40-64 Years, Male Preventive care refers to lifestyle choices and visits with your health care provider that can promote health and wellness. What does preventive care include?  A yearly physical exam. This is also called an annual well check.  Dental exams once or twice a year.  Routine eye exams. Ask your health care provider how often you should have your eyes checked.  Personal lifestyle choices, including:  Daily care of your teeth and gums.  Regular physical activity.  Eating a healthy diet.  Avoiding tobacco and drug use.  Limiting alcohol use.  Practicing safe sex.  Taking low-dose aspirin every day starting at age 3. What happens during an annual well check? The services and screenings done by your health care provider during your annual well check will depend on your age, overall health, lifestyle risk factors, and family history of disease. Counseling  Your health care provider may ask you questions  about your:  Alcohol use.  Tobacco use.  Drug use.  Emotional well-being.  Home and relationship well-being.  Sexual activity.  Eating habits.  Work and work Astronomer. Screening  You may have the following tests or measurements:  Height, weight, and BMI.  Blood pressure.  Lipid and cholesterol levels. These may be checked every 5 years, or more frequently if you are over 81 years old.  Skin check.  Lung cancer screening. You may have this screening every year starting at age 56 if you have a 30-pack-year history of smoking and currently smoke or have quit within the past 15 years.  Fecal occult blood test (FOBT) of the stool. You may have this test every year starting at age 89.  Flexible sigmoidoscopy or colonoscopy. You may have a sigmoidoscopy every 5 years or a colonoscopy every 10 years starting at age 64.  Prostate cancer screening. Recommendations will vary depending on your family history and other risks.  Hepatitis C blood test.  Hepatitis B blood test.  Sexually transmitted disease (STD) testing.  Diabetes screening. This is done by checking your blood sugar (glucose) after you have not eaten for a while (fasting). You may have this done every 1-3 years. Discuss your test results, treatment options, and if necessary, the need for more tests with your health care provider. Vaccines  Your health care provider may recommend certain vaccines, such as:  Influenza vaccine. This is recommended every year.  Tetanus, diphtheria, and acellular pertussis (Tdap, Td) vaccine. You may need a Td booster every 10 years.  Zoster vaccine. You may need this after age 62.  Pneumococcal 13-valent conjugate (PCV13) vaccine. You may need this if you have certain conditions and have  not been vaccinated.  Pneumococcal polysaccharide (PPSV23) vaccine. You may need one or two doses if you smoke cigarettes or if you have certain conditions. Talk to your health care provider  about which screenings and vaccines you need and how often you need them. This information is not intended to replace advice given to you by your health care provider. Make sure you discuss any questions you have with your health care provider. Document Released: 06/06/2015 Document Revised: 01/28/2016 Document Reviewed: 03/11/2015 Elsevier Interactive Patient Education  2017 [employer] Prevention in the Home Falls can cause injuries. They can happen to people of all ages. There are many things you can do to make your home safe and to help prevent falls. What can I do on the outside of my home?  Regularly fix the edges of walkways and driveways and fix any cracks.  Remove anything that might make you trip as you walk through a door, such as a raised step or threshold.  Trim any bushes or trees on the path to your home.  Use bright outdoor lighting.  Clear any walking paths of anything that might make someone trip, such as rocks or tools.  Regularly check to see if handrails are loose or broken. Make sure that both sides of any steps have handrails.  Any raised decks and porches should have guardrails on the edges.  Have any leaves, snow, or ice cleared regularly.  Use sand or salt on walking paths during winter.  Clean up any spills in your garage right away. This includes oil or grease spills. What can I do in the bathroom?  Use night lights.  Install grab bars by the toilet and in the tub and shower. Do not use towel bars as grab bars.  Use non-skid mats or decals in the tub or shower.  If you need to sit down in the shower, use a plastic, non-slip stool.  Keep the floor dry. Clean up any water that spills on the floor as soon as it happens.  Remove soap buildup in the tub or shower regularly.  Attach bath mats securely with double-sided non-slip rug tape.  Do not have throw rugs and other things on the floor that can make you trip. What can I do in the  bedroom?  Use night lights.  Make sure that you have a light by your bed that is easy to reach.  Do not use any sheets or blankets that are too big for your bed. They should not hang down onto the floor.  Have a firm chair that has side arms. You can use this for support while you get dressed.  Do not have throw rugs and other things on the floor that can make you trip. What can I do in the kitchen?  Clean up any spills right away.  Avoid walking on wet floors.  Keep items that you use a lot in easy-to-reach places.  If you need to reach something above you, use a strong step stool that has a grab bar.  Keep electrical cords out of the way.  Do not use floor polish or wax that makes floors slippery. If you must use wax, use non-skid floor wax.  Do not have throw rugs and other things on the floor that can make you trip. What can I do with my stairs?  Do not leave any items on the stairs.  Make sure that there are handrails on both sides of the stairs and use  them. Fix handrails that are broken or loose. Make sure that handrails are as long as the stairways.  Check any carpeting to make sure that it is firmly attached to the stairs. Fix any carpet that is loose or worn.  Avoid having throw rugs at the top or bottom of the stairs. If you do have throw rugs, attach them to the floor with carpet tape.  Make sure that you have a light switch at the top of the stairs and the bottom of the stairs. If you do not have them, ask someone to add them for you. What else can I do to help prevent falls?  Wear shoes that:  Do not have high heels.  Have rubber bottoms.  Are comfortable and fit you well.  Are closed at the toe. Do not wear sandals.  If you use a stepladder:  Make sure that it is fully opened. Do not climb a closed stepladder.  Make sure that both sides of the stepladder are locked into place.  Ask someone to hold it for you, if possible.  Clearly mark and make  sure that you can see:  Any grab bars or handrails.  First and last steps.  Where the edge of each step is.  Use tools that help you move around (mobility aids) if they are needed. These include:  Canes.  Walkers.  Scooters.  Crutches.  Turn on the lights when you go into a dark area. Replace any light bulbs as soon as they burn out.  Set up your furniture so you have a clear path. Avoid moving your furniture around.  If any of your floors are uneven, fix them.  If there are any pets around you, be aware of where they are.  Review your medicines with your doctor. Some medicines can make you feel dizzy. This can increase your chance of falling. Ask your doctor what other things that you can do to help prevent falls. This information is not intended to replace advice given to you by your health care provider. Make sure you discuss any questions you have with your health care provider. Document Released: 03/06/2009 Document Revised: 10/16/2015 Document Reviewed: 06/14/2014 Elsevier Interactive Patient Education  2017 [employer].

## 2019-04-06 NOTE — Progress Notes (Signed)
Subjective:   [name] is a 63 y.o. male who presents for an Initial Medicare Annual Wellness Visit.  Virtual Visit via Telephone Note  I connected with [name] on 04/06/19 at 10:40 AM EST by telephone and verified that I am speaking with the correct person using two identifiers.  Medicare Annual Wellness visit completed telephonically due to Covid-19 pandemic.   Location: Patient: home Provider: office   I discussed the limitations, risks, security and privacy concerns of performing an evaluation and management service by telephone and the availability of in person appointments. The patient expressed understanding and agreed to proceed.  Some vital signs may be absent or patient reported.   Thomas Marker, LPN    Review of Systems   Cardiac Risk Factors include: advanced age (>63men, >33 women);dyslipidemia;male gender;hypertension    Objective:    Today's Vitals   04/06/19 1054  BP: 127/77  Pulse: 72  Weight: 218 lb (98.9 kg)  Height: 6' (1.829 m)   Body mass index is 29.57 kg/m.  Advanced Directives 04/06/2019 01/18/2018 06/30/2017 03/09/2017 12/08/2016 12/03/2016 11/11/2016  Does Patient Have a Medical Advance Directive? Yes No No No No No No  Type of Paramedic of [place];Living will - - - - - -  Does patient want to make changes to medical advance directive? - - - - - - -  Copy of [place] in Chart? No - copy requested - - - - - -  Would patient like information on creating a medical advance directive? - No - Patient declined No - Patient declined - No - Patient declined - -  Pre-existing out of facility DNR order (yellow form or pink MOST form) - - - - - - -    Current Medications (verified) Outpatient Encounter Medications as of 04/06/2019  Medication Sig  . acetaminophen (TYLENOL) 500 MG tablet Take 1,000 mg by mouth daily as needed for moderate pain.  Marland Kitchen amLODipine (NORVASC) 10 MG tablet Take 1 tablet  (10 mg total) by mouth daily.  Marland Kitchen aspirin EC 325 MG tablet TAKE 1 TABLET (325 MG TOTAL) BY MOUTH DAILY.  . carvedilol (COREG) 6.25 MG tablet Take 1 tablet (6.25 mg total) by mouth 2 (two) times daily with a meal.  . losartan (COZAAR) 100 MG tablet Take 1 tablet (100 mg total) by mouth daily.  . Multiple Vitamins-Minerals (ONE-A-DAY MENS HEALTH FORMULA) TABS Take 1 tablet by mouth daily.  . Naphazoline-Hypromellose (TGT LUBRICANT REDNESS RELIEVER OP) Place 1 drop into both eyes daily as needed (redness/ dryness).  . Omega-3 Fatty Acids (FISH OIL) 1000 MG CAPS Take 1,000 mg by mouth daily.  . rosuvastatin (CRESTOR) 40 MG tablet Take 1 tablet (40 mg total) by mouth at bedtime. For cholesterol  . cloNIDine (CATAPRES) 0.1 MG tablet Take 0.1 mg by mouth as needed.   . [DISCONTINUED] multivitamin (ONE-A-DAY MEN'S) TABS tablet Take by mouth.   No facility-administered encounter medications on file as of 04/06/2019.     Allergies (verified) Patient has no known allergies.   History: Past Medical History:  Diagnosis Date  . AAA (abdominal aortic aneurysm) ([place])   . Bowel obstruction ([place]) 2016   during septic event. no further a problem once sepsis resolved  . 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 Chronic kidney disease 2016   happened after stroke. dr. Holley Raring follows him once a year  . Hyperlipidemia   . Hypertension   .  Inguinal hernia   . Mild atherosclerosis of carotid artery 12/2017   bilateral stenosis per vascular surgeon  . Pink eye 06/21/2016  . S/P Botox injection    every 3 months into right arm to loosen up muscles  . Sepsis ([facility]) 2016   happened after stroke. unsure when,but kidney disease started. followed by dr. Cherylann Ratellateef  . Stroke [facility]) 12/24/2013   right side weakness   Past Surgical History:  Procedure Laterality Date  . HYDROCELE EXCISION Left 01/18/2018   Procedure: HYDROCELECTOMY ADULT;  Surgeon: Vanna ScotlandBrandon, Ashley, MD;   Location: [facility] ORS;  Service: Urology;  Laterality: Left;  . INGUINAL HERNIA REPAIR Bilateral 06/25/2016   Procedure: HERNIA REPAIR INGUINAL ADULT BILATERAL;  Surgeon: Kieth BrightlySeeplaputhur G Sankar, MD;  Location: [facility] ORS;  Service: General;  Laterality: Bilateral;  . KNEE ARTHROSCOPY Right 1970's   Family History  Problem Relation Age of Onset  . Hypertension Mother   . Hernia Sister   . Prostate cancer Neg Hx   . Bladder Cancer Neg Hx   . Kidney cancer Neg Hx    Social History   Socioeconomic History  . Marital status: Married    Spouse name: rosamund  . Number of children: 0  . Years of education: Not on file  . Highest education level: Not on file  Occupational History  . Not on file  Social Needs  . Financial resource strain: Not very hard  . Food insecurity    Worry: Never true    Inability: Never true  . Transportation needs    Medical: No    Non-medical: No  Tobacco Use  . Smoking status: Never Smoker  . Smokeless tobacco: Never Used  Substance and Sexual Activity  . Alcohol use: No    Alcohol/week: 0.0 standard drinks  . Drug use: No    Comment: used to use THC in the late 1980s  . Sexual activity: Yes  Lifestyle  . Physical activity    Days per week: 7 days    Minutes per session: 30 min  . Stress: Not at all  Relationships  . Social Musicianconnections    Talks on phone: Patient refused    Gets together: Patient refused    Attends religious service: Patient refused    Active member of club or organization: Patient refused    Attends meetings of clubs or organizations: Patient refused    Relationship status: Married  Other Topics Concern  . Not on file  Social History Narrative  . Not on file   Tobacco Counseling Counseling given: Not Answered   Clinical Intake:  Pre-visit preparation completed: Yes  Pain : No/denies pain     BMI - recorded: 29.57 Nutritional Status: BMI 25 -29 Overweight Nutritional Risks: None Diabetes: No  How often do you need  to have someone help you when you read instructions, pamphlets, or other written materials from your doctor or pharmacy?: 1 - Never  Interpreter Needed?: No  Information entered by :: Reather LittlerKasey Vikram Tillett LPN  Activities of Daily Living In your present state of health, do you have any difficulty performing the following activities: 04/06/2019 01/10/2019  Hearing? N N  Comment declines hearing aids -  Vision? N N  Difficulty concentrating or making decisions? N N  Walking or climbing stairs? N N  Dressing or bathing? N N  Doing errands, shopping? N N  Preparing Food and eating ? N -  Using the Toilet? N -  In the past six months, have you accidently leaked  urine? N -  Do you have problems with loss of bowel control? N -  Managing your Medications? N -  Managing your Finances? N -  Housekeeping or managing your Housekeeping? N -  Some recent data might be hidden     Immunizations and Health Maintenance Immunization History  Administered Date(s) Administered  . Influenza,inj,Quad PF,6+ Mos 02/18/2015, 03/09/2017, 03/10/2018, 03/14/2019  . Tdap 07/21/2015   Health Maintenance Due  Topic Date Due  . Hepatitis C Screening  1956-03-26  . HIV Screening  06/30/1969  . Fecal DNA (Cologuard)  06/30/2004    Patient Care Team: Lada, Janit Bern, MD as PCP - General (Family Medicine) Lada, Janit Bern, MD as Attending Physician (Family Medicine) Kieth Brightly, MD (General Surgery) Mady Haagensen, MD as Consulting Physician (Nephrology) Runell Gess, MD as Consulting Physician (Cardiology) Wyn Quaker Marlow Baars, MD as Referring Physician (Vascular Surgery) Vanna Scotland, MD as Consulting Physician (Urology) Juanell Fairly, RN as Registered Nurse  Indicate any recent Medical Services you may have received from other than [facility] providers in the past year (date may be approximate).    Assessment:   This is a routine wellness examination for Mccabe.  Hearing/Vision screen  Hearing  Screening             Right ear:           Left ear:           Comments: Pt denies hearing difficulty  Vision Screening Comments: [facility] screen[facility]  Dietary issues and exercise activities discussed: Current Exercise Habits: Home exercise routine, Type of exercise: walking;Other - see comments(recumbent bike), Time (Minutes): 30, Frequency (Times/Week): 7, Weekly Exercise (Minutes/Week): 210, Intensity: Moderate, Exercise limited by: orthopedic condition(s);neurologic condition(s)  Goals    . Weight (lb) < 210 lb (95.3 kg)     Pt would like to lose a little more weight to help make sure prediabetes stays under control.       Depression Screen PHQ 2/9 Scores 04/06/2019 01/10/2019 03/10/2018 12/19/2017  PHQ - 2 Score 0 0 0 0  PHQ- 9 Score - 0 - -    Fall Risk Fall Risk  04/06/2019 02/08/2019 01/10/2019 10/12/2018 06/29/2018  Falls in the past year? 0 0 0 0 0  Number falls in past yr: 0 - 0 - 0  Injury with Fall? 0 - 0 - 0  Risk for fall due to : - - - - -  Follow up Falls prevention discussed - - - -   FALL RISK PREVENTION PERTAINING TO THE HOME:  Any stairs in or around the home? Yes  If so, do they handrails? Yes   Home free of loose throw rugs in walkways, pet beds, electrical cords, etc? Yes  Adequate lighting in your home to reduce risk of falls? Yes   ASSISTIVE DEVICES UTILIZED TO PREVENT FALLS:  Life alert? No  Use of a cane, walker or w/c? No  Grab bars in the bathroom? Yes  Shower chair or bench in shower? No  Elevated toilet seat or a handicapped toilet? No   DME ORDERS:  DME order needed?  No   TIMED UP AND GO:  Was the test performed? No . Telephonic visit.   Education: Fall risk prevention has been discussed.  Intervention(s) required? No    Cognitive Function:     6CIT Screen 04/06/2019  What Year? 0 points  What month? 0 points  What time? 0 points  Count back from  20  0 points  Months in reverse 0 points  Repeat phrase 0 points  Total Score 0    Screening Tests Health Maintenance  Topic Date Due  . Hepatitis C Screening  01-12-1956  . HIV Screening  06/30/1969  . Fecal DNA (Cologuard)  06/30/2004  . TETANUS/TDAP  07/20/2025  . INFLUENZA VACCINE  Completed    Qualifies for Shingles Vaccine? Yes . Due for Shingrix. Education has been provided regarding the importance of this vaccine. Pt has been advised to call insurance company to determine out of pocket expense. Advised may also receive vaccine at local pharmacy or Health Dept. Verbalized acceptance and understanding.  Tdap: Up to date  Flu Vaccine: Up to date  Pneumococcal Vaccine: Due for Pneumococcal vaccine. Does the patient want to receive this vaccine today?  No . Education has been provided regarding the importance of this vaccine but still declined. Advised may receive this vaccine at local pharmacy or Health Dept. Aware to provide a copy of the vaccination record if obtained from local pharmacy or Health Dept. Verbalized acceptance and understanding.  Cancer Screenings:  Colorectal Screening: Not completed. Pt declines this screening.   Lung Cancer Screening: (Low Dose CT Chest recommended if Age 51-80 years, 30 pack-year currently smoking OR have quit w/in 15years.) does not qualify.    Additional Screening:  Hepatitis C Screening: does qualify; postponed   Vision Screening: Recommended annual ophthalmology exams for early detection of glaucoma and other disorders of the eye. Is the patient up to date with their annual eye exam?  Yes  Who is the provider or what is the name of the office in which the pt attends annual eye exams? [facility]  Dental Screening: Recommended annual dental exams for proper oral hygiene  Community Resource Referral:  CRR required this visit?  No       Plan:    I have personally reviewed and addressed the Medicare Annual Wellness  questionnaire and have noted the following in the patient's chart:  A. Medical and social history B. Use of alcohol, tobacco or illicit drugs  C. Current medications and supplements D. Functional ability and status E.  Nutritional status F.  Physical activity G. Advance directives H. List of other physicians I.  Hospitalizations, surgeries, and ER visits in previous 12 months J.  Vitals K. Screenings such as hearing and vision if needed, cognitive and depression L. Referrals and appointments    In addition, I have reviewed and discussed with patient certain preventive protocols, quality metrics, and best practice recommendations. A written personalized care plan for preventive services as well as general preventive health recommendations were provided to patient.   Signed,  Reather Littler, LPN Nurse Health Advisor   Nurse Notes: none

## 2019-04-23 ENCOUNTER — Telehealth: Payer: Medicare Other

## 2019-05-10 ENCOUNTER — Encounter: Payer: Medicare Other | Admitting: Physical Medicine & Rehabilitation

## 2019-05-11 ENCOUNTER — Telehealth: Payer: Self-pay

## 2019-05-11 ENCOUNTER — Ambulatory Visit: Payer: Self-pay

## 2019-05-11 NOTE — Chronic Care Management (AMB) (Signed)
  Chronic Care Management   Outreach Note  05/11/2019 Name: [name] MRN: [id number] DOB: Feb 21, 1956  Primary Care Provider: Delsa Grana, PA-C Reason for referral : Chronic Care Management   An unsuccessful telephone outreach was attempted today. [name] was referred to the case management team for assistance with care management and care coordination.   A HIPAA compliant voice message was left requesting a return call.   Follow Up Plan: The care management team will reach out to [name] again within the next two to three weeks.   [facility] Care Management [phone]

## 2019-05-30 ENCOUNTER — Ambulatory Visit: Payer: Self-pay

## 2019-05-30 ENCOUNTER — Telehealth: Payer: Self-pay

## 2019-05-30 NOTE — Chronic Care Management (AMB) (Signed)
  Chronic Care Management   Outreach Note  05/30/2019 Name: [name] MRN: [id number] DOB: 02-22-1956  Primary Care Provider: Danelle Berry, PA-C Reason for referral : Chronic Care Management   A second unsuccessful telephone outreach was attempted today. [name] was referred to the case management team for assistance with care management and care coordination. A HIPAA compliant voice message was left requesting a return call.    Follow Up Plan: The care management team will reach out to [name] again within the next two to three weeks.      [facility] Care Management [phone]

## 2019-06-08 ENCOUNTER — Encounter: Payer: Self-pay | Admitting: Physical Medicine & Rehabilitation

## 2019-06-08 ENCOUNTER — Encounter: Payer: Medicare Other | Attending: Physical Medicine & Rehabilitation | Admitting: Physical Medicine & Rehabilitation

## 2019-06-08 ENCOUNTER — Other Ambulatory Visit: Payer: Self-pay

## 2019-06-08 VITALS — BP 149/81 | HR 55 | Temp 97.7°F | Ht 71.5 in | Wt 221.0 lb

## 2019-06-08 DIAGNOSIS — G8110 Spastic hemiplegia affecting unspecified side: Secondary | ICD-10-CM | POA: Insufficient documentation

## 2019-06-08 NOTE — Patient Instructions (Signed)

## 2019-06-08 NOTE — Progress Notes (Signed)
Botox Injection for spasticity using needle EMG guidance  Dilution: 50 Units/ml Indication: Severe spasticity which interferes with ADL,mobility and/or  hygiene and is unresponsive to medication management and other conservative care Informed consent was obtained after describing risks and benefits of the procedure with the patient. This includes bleeding, bruising, infection, excessive weakness, or medication side effects. A REMS form is on file and signed. Needle: 27g 1" needle electrode Number of units per muscle Biceps100 Brachialis 100  Brachioradialis 50U  FCR25  FDS25 FDP25 PT50 PQ 25 All injections were done after obtaining appropriate EMG activity and after negative drawback for blood. The patient tolerated the procedure well. Post procedure instructions were given. A followup appointment was made.

## 2019-06-11 ENCOUNTER — Other Ambulatory Visit: Payer: Self-pay

## 2019-06-11 ENCOUNTER — Telehealth: Payer: Self-pay

## 2019-06-11 DIAGNOSIS — I700 Atherosclerosis of aorta: Secondary | ICD-10-CM

## 2019-06-11 DIAGNOSIS — I10 Essential (primary) hypertension: Secondary | ICD-10-CM

## 2019-06-11 DIAGNOSIS — E785 Hyperlipidemia, unspecified: Secondary | ICD-10-CM

## 2019-06-11 MED ORDER — LOSARTAN POTASSIUM 100 MG PO TABS: 100.0000 mg | ORAL_TABLET | Freq: Every day | ORAL | 1 refills | Status: DC

## 2019-06-11 MED ORDER — ROSUVASTATIN CALCIUM 40 MG PO TABS: 40.0000 mg | ORAL_TABLET | Freq: Every day | ORAL | 1 refills | Status: DC

## 2019-06-11 MED ORDER — AMLODIPINE BESYLATE 10 MG PO TABS: 10.0000 mg | ORAL_TABLET | Freq: Every day | ORAL | 1 refills | Status: DC

## 2019-06-11 MED ORDER — CARVEDILOL 6.25 MG PO TABS: 6.2500 mg | ORAL_TABLET | Freq: Two times a day (BID) | ORAL | 1 refills | Status: DC

## 2019-06-11 NOTE — Telephone Encounter (Signed)
Pt has 6 m scheduled for feb

## 2019-06-20 ENCOUNTER — Telehealth: Payer: Self-pay

## 2019-06-29 ENCOUNTER — Ambulatory Visit: Payer: Self-pay

## 2019-06-29 ENCOUNTER — Telehealth: Payer: Self-pay

## 2019-06-29 NOTE — Chronic Care Management (AMB) (Signed)
  Chronic Care Management   Outreach Note  06/29/2019 Name: [name] MRN: [id number] DOB: 02/01/1956  Primary Care Provider: Danelle Berry, PA-C Reason for referral : Chronic Care Management     Third unsuccessful telephone outreach was attempted today. Mr. Degraffenreid was referred to the care management team for assistance with chronic care management and care coordination. His primary care provider will be notified of our unsuccessful attempts to maintain contact. The care management team will gladly outreach at any time in the future if he is interested in receiving assistance.   Follow Up Plan:  The care management team will gladly follow up with him after the primary care provider has a conversation with him regarding recommendation for care management engagement and subsequent re-referral for care management services.   [facility] Care Management [phone]

## 2019-07-06 ENCOUNTER — Ambulatory Visit: Payer: Medicare Other | Admitting: Family Medicine

## 2019-07-24 ENCOUNTER — Encounter: Payer: Self-pay | Admitting: Family Medicine

## 2019-07-24 ENCOUNTER — Other Ambulatory Visit: Payer: Self-pay

## 2019-07-24 ENCOUNTER — Telehealth (INDEPENDENT_AMBULATORY_CARE_PROVIDER_SITE_OTHER): Payer: Medicare Other | Admitting: Family Medicine

## 2019-07-24 VITALS — BP 122/78 | HR 69 | Ht 71.0 in | Wt 220.0 lb

## 2019-07-24 DIAGNOSIS — E785 Hyperlipidemia, unspecified: Secondary | ICD-10-CM | POA: Diagnosis not present

## 2019-07-24 DIAGNOSIS — Z5181 Encounter for therapeutic drug level monitoring: Secondary | ICD-10-CM

## 2019-07-24 DIAGNOSIS — I10 Essential (primary) hypertension: Secondary | ICD-10-CM

## 2019-07-24 DIAGNOSIS — I700 Atherosclerosis of aorta: Secondary | ICD-10-CM

## 2019-07-24 DIAGNOSIS — R7303 Prediabetes: Secondary | ICD-10-CM

## 2019-07-24 DIAGNOSIS — E039 Hypothyroidism, unspecified: Secondary | ICD-10-CM

## 2019-07-24 DIAGNOSIS — G8110 Spastic hemiplegia affecting unspecified side: Secondary | ICD-10-CM

## 2019-07-24 NOTE — Progress Notes (Signed)
Name: [name]   MRN: [id number]    DOB: [birth year]   Date:07/24/2019       Progress Note  Subjective:    I connected with  [name]  on 07/24/19 at  8:40 AM EST by a video enabled telemedicine application and verified that I am speaking with the correct person using two identifiers.  I discussed the limitations of evaluation and management by telemedicine and the availability of in person appointments. The patient expressed understanding and agreed to proceed. Staff also discussed with the patient that there may be a patient responsible charge related to this service. Patient Location: home Provider Location: [facility] Additional Individuals present: none  Chief Complaint  Patient presents with  . Follow-up  . Hypertension  . Hyperlipidemia    [name] is a 64 y.o. male, presents for virtual visit for routine follow up on the conditions listed above.  Hypertension:  Currently managed on norvasc carvedilol, losartan and clonidine only used PRN for very high BP - he hasn't taken for over  Pt reports  med compliance and denies any SE.  No lightheadedness, hypotension, syncope. Blood pressure today is well controlled. BP Readings from Last 3 Encounters:  07/24/19 122/78  06/08/19 (!) 149/81  04/06/19 127/77   Pt denies CP, SOB, exertional sx, LE edema, palpitation, Ha's, visual disturbances  Hyperlipidemia: Current Medication Regimen:  crestor 40 mg, compliant with meds, no SE or concerns Last Lipids: Lab Results  Component Value Date   CHOL 156 06/29/2018   HDL 48 06/29/2018   LDLCALC 94 06/29/2018   TRIG 46 06/29/2018   CHOLHDL 3.3 06/29/2018   - Denies: Chest pain, shortness of breath, myalgias. - Risk factors for atherosclerosis: hypercholesterolemia and hypertension  Prediabetes: Lab Results  Component Value Date   HGBA1C 6.4 (H) 06/29/2018   A1C gradually increasing over the past couple years.  Pt not on meds or checking blood sugar He has  also had some abnormal TSH in the past Trying to have overall healthy diet habits  Hx of stroke 2015 - right sided hemiparesis He eats healthy, takes ASA daily no bleeding concerns, walks 79024 steps daily, denies CP, SOB, myalgias, claudications   Patient Active Problem List   Diagnosis Date Noted  . Obesity (BMI 30.0-34.9) 03/10/2018  . Abnormal EKG 12/15/2017  . Hypothyroidism 03/09/2017  . Abdominal aortic atherosclerosis (HCC) 05/20/2016  . Pre-diabetes 02/18/2015  . Atherosclerosis of both carotid arteries 02/18/2015  . 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 Shoulder subluxation, right 04/23/2014  . Spastic hemiplegia affecting dominant side (HCC) 02/18/2014  . Hypertension goal BP (blood pressure) < 140/90 01/17/2014  . Hyperlipidemia LDL goal <70 01/17/2014    Current Outpatient Medications:  .  acetaminophen (TYLENOL) 500 MG tablet, Take 1,000 mg by mouth daily as needed for moderate pain., Disp: , Rfl:  .  amLODipine (NORVASC) 10 MG tablet, Take 1 tablet (10 mg total) by mouth daily., Disp: 90 tablet, Rfl: 1 .  aspirin EC 325 MG tablet, TAKE 1 TABLET (325 MG TOTAL) BY MOUTH DAILY., Disp: 90 tablet, Rfl: 3 .  carvedilol (COREG) 6.25 MG tablet, Take 1 tablet (6.25 mg total) by mouth 2 (two) times daily with a meal., Disp: 180 tablet, Rfl: 1 .  cloNIDine (CATAPRES) 0.1 MG tablet, Take 0.1 mg by mouth as needed. , Disp: , Rfl:  .  losartan (COZAAR) 100 MG tablet, Take 1 tablet (100 mg total) by mouth daily., Disp: 90 tablet, Rfl: 1 .  Multiple Vitamins-Minerals (ONE-A-DAY MENS HEALTH FORMULA) TABS, Take 1 tablet by mouth daily., Disp: , Rfl:  .  Naphazoline-Hypromellose (TGT LUBRICANT REDNESS RELIEVER OP), Place 1 drop into both eyes daily as needed (redness/ dryness)., Disp: , Rfl:  .  Omega-3 Fatty Acids (FISH OIL) 1000 MG CAPS, Take 1,000 mg by mouth daily., Disp: , Rfl:  .  rosuvastatin (CRESTOR) 40 MG tablet, Take 1 tablet (40 mg total) by mouth at bedtime. For  cholesterol, Disp: 90 tablet, Rfl: 1 No Known Allergies  Past Surgical History:  Procedure Laterality Date  . HYDROCELE EXCISION Left 01/18/2018   Procedure: HYDROCELECTOMY ADULT;  Surgeon: Vanna Scotland, MD;  Location: [facility] ORS;  Service: Urology;  Laterality: Left;  . INGUINAL HERNIA REPAIR Bilateral 06/25/2016   Procedure: HERNIA REPAIR INGUINAL ADULT BILATERAL;  Surgeon: Kieth Brightly, MD;  Location: [facility] ORS;  Service: General;  Laterality: Bilateral;  . KNEE ARTHROSCOPY Right 1970's   Family History  Problem Relation Age of Onset  . Hypertension Mother   . Hernia Sister   . Prostate cancer Neg Hx   . Bladder Cancer Neg Hx   . Kidney cancer Neg Hx    Social History   Socioeconomic History  . Marital status: Married    Spouse name: rosamund  . Number of children: 0  . Years of education: Not on file  . Highest education level: Not on file  Occupational History  . Not on file  Tobacco Use  . Smoking status: Never Smoker  . Smokeless tobacco: Never Used  Substance and Sexual Activity  . Alcohol use: No    Alcohol/week: 0.0 standard drinks  . Drug use: No    Comment: used to use THC in the late 1980s  . Sexual activity: Yes  Other Topics Concern  . Not on file  Social History Narrative  . Not on file   Social Determinants of Health   Financial Resource Strain: Low Risk   . Difficulty of Paying Living Expenses: Not very hard  Food Insecurity: No Food Insecurity  . Worried About Programme researcher, broadcasting/film/video in the Last Year: Never true  . Ran Out of Food in the Last Year: Never true  Transportation Needs: No Transportation Needs  . Lack of Transportation (Medical): No  . Lack of Transportation (Non-Medical): No  Physical Activity: Unknown  . Days of Exercise per Week: 7 days  . Minutes of Exercise per Session: Not on file  Stress:   . Feeling of Stress : Not on file  Social Connections: Unknown  . Frequency of Communication with Friends and Family: Patient  refused  . Frequency of Social Gatherings with Friends and Family: Patient refused  . Attends Religious Services: Patient refused  . Active Member of Clubs or Organizations: Patient refused  . Attends Banker Meetings: Patient refused  . Marital Status: Married  Catering manager Violence: Not At Risk  . Fear of Current or Ex-Partner: No  . Emotionally Abused: No  . Physically Abused: No  . Sexually Abused: No    Chart Review Today: I personally reviewed active problem list, medication list, allergies, family history, social history, health maintenance, notes from last encounter, lab results, imaging with the patient/caregiver today.   Review of Systems  Constitutional: Negative.   HENT: Negative.   Eyes: Negative.   Respiratory: Negative.   Cardiovascular: Negative.   Gastrointestinal: Negative.   Endocrine: Negative.   Genitourinary: Negative.   Musculoskeletal: Negative.   Skin: Negative.   Allergic/Immunologic:  Negative.   Neurological: Negative.   Hematological: Negative.   Psychiatric/Behavioral: Negative.   All other systems reviewed and are negative.     Objective:    Virtual encounter, vitals limited, only able to obtain the following Today's Vitals   07/24/19 0809  BP: 122/78  Pulse: 69  Weight: 220 lb (99.8 kg)  Height: 5\' 11"  (1.803 m)   Body mass index is 30.68 kg/m. Nursing Note and Vital Signs reviewed.  Physical Exam Vitals and nursing note reviewed.  Constitutional:      Appearance: Normal appearance.  Pulmonary:     Effort: No respiratory distress.  Neurological:     Mental Status: He is alert.  Psychiatric:        Mood and Affect: Mood normal.        Behavior: Behavior normal.     PE limited by telephone encounter  No results found for this or any previous visit (from the past 72 hour(s)).  PHQ2/9: Depression screen [facility] 2/9 04/06/2019 01/10/2019 03/10/2018 12/19/2017 09/08/2017  Decreased Interest 0 0 0 0 0  Down,  Depressed, Hopeless 0 0 0 0 0  PHQ - 2 Score 0 0 0 0 0  Altered sleeping - 0 - - -  Tired, decreased energy - 0 - - -  Change in appetite - 0 - - -  Feeling bad or failure about yourself  - 0 - - -  Trouble concentrating - 0 - - -  Moving slowly or fidgety/restless - 0 - - -  Suicidal thoughts - 0 - - -  PHQ-9 Score - 0 - - -  Difficult doing work/chores - Not difficult at all - - -  Some recent data might be hidden   PHQ-2/9 Result is neg, reviewed today  Fall Risk: Fall Risk  07/24/2019 06/08/2019 04/06/2019 02/08/2019 01/10/2019  Falls in the past year? 0 0 0 0 0  Number falls in past yr: 0 - 0 - 0  Injury with Fall? 0 - 0 - 0  Risk for fall due to : - - - - -  Follow up - - Falls prevention discussed - -     Assessment and Plan:     ICD-10-CM   1. Hypertension goal BP (blood pressure) < 140/90  I10 COMPLETE METABOLIC PANEL WITH GFR   stable, well controlled  2. Hypothyroidism, unspecified type  E03.9 TSH   past abnormal labs, not currently on meds, recheck  3. Hyperlipidemia LDL goal <70  E78.5 COMPLETE METABOLIC PANEL WITH GFR    Lipid panel   compliant with statin, tolerating, working on diet and does a lot of walking, recheck labs  4. Spastic hemiplegia affecting dominant side (HCC)  G81.10   5. Pre-diabetes  R73.03 COMPLETE METABOLIC PANEL WITH GFR    Hemoglobin A1c   recheck A1C, has been gradually worsening, last A1c was 6.4, no current sx of new on set DM or hyperglycemia  6. Abdominal aortic atherosclerosis (HCC)  I70.0    on statin, monitoring  7. History of stroke  Z86.73   8. Encounter for medication monitoring  Z51.81 CBC with Differential/Platelet    COMPLETE METABOLIC PANEL WITH GFR    Lipid panel    Hemoglobin A1c    TSH    I discussed the assessment and treatment plan with the patient. The patient was provided an opportunity to ask questions and all were answered. The patient agreed with the plan and demonstrated an understanding of the  instructions.  The  patient was advised to call back or seek an in-person evaluation if the symptoms worsen or if the condition fails to improve as anticipated.  I provided 35 minutes of non-face-to-face time during this encounter  Delsa Grana, PA-C 07/24/19 9:19 AM

## 2019-07-31 ENCOUNTER — Encounter: Payer: Self-pay | Admitting: Family Medicine

## 2019-08-23 ENCOUNTER — Ambulatory Visit: Payer: Medicare Other | Attending: Internal Medicine

## 2019-08-23 DIAGNOSIS — Z23 Encounter for immunization: Secondary | ICD-10-CM

## 2019-08-23 NOTE — Progress Notes (Signed)
   Covid-19 Vaccination Clinic  Name:  [name]    MRN: [id number] DOB: [birth year]  08/23/2019  [name] was observed post Covid-19 immunization for 15 minutes without incident. He was provided with Vaccine Information Sheet and instruction to access the V-Safe system.   [name] was instructed to call 911 with any severe reactions post vaccine: Marland Kitchen Difficulty breathing  . Swelling of face and throat  . A fast heartbeat  . A bad rash all over body  . Dizziness and weakness   Immunizations Administered    Name Date Dose VIS Date Route   Pfizer COVID-19 Vaccine 08/23/2019 12:56 PM 0.3 mL 05/04/2019 Intramuscular   Manufacturer: [employer], [employer]   Lot: [id number]   NDC: [address]-8

## 2019-09-07 ENCOUNTER — Ambulatory Visit: Payer: Medicare Other | Admitting: Physical Medicine & Rehabilitation

## 2019-09-17 ENCOUNTER — Ambulatory Visit: Payer: Medicare Other | Attending: Internal Medicine

## 2019-09-17 DIAGNOSIS — Z23 Encounter for immunization: Secondary | ICD-10-CM

## 2019-09-17 NOTE — Progress Notes (Signed)
   Covid-19 Vaccination Clinic  Name:  [name]    MRN: [id number] DOB: Feb 23, 1956  09/17/2019  [name] was observed post Covid-19 immunization for 15 minutes without incident. He was provided with Vaccine Information Sheet and instruction to access the V-Safe system.   [name] was instructed to call 911 with any severe reactions post vaccine: Marland Kitchen Difficulty breathing  . Swelling of face and throat  . A fast heartbeat  . A bad rash all over body  . Dizziness and weakness   Immunizations Administered    Name Date Dose VIS Date Route   Pfizer COVID-19 Vaccine 09/17/2019 12:10 PM 0.3 mL 07/18/2018 Intramuscular   Manufacturer: [employer], [employer]   Lot: [id number]   NDC: [address]-6

## 2019-10-02 ENCOUNTER — Other Ambulatory Visit: Payer: Self-pay

## 2019-10-02 ENCOUNTER — Encounter: Payer: Self-pay | Admitting: Physical Medicine & Rehabilitation

## 2019-10-02 ENCOUNTER — Encounter: Payer: Medicare Other | Attending: Physical Medicine & Rehabilitation | Admitting: Physical Medicine & Rehabilitation

## 2019-10-02 VITALS — BP 174/91 | HR 60 | Temp 97.7°F | Ht 71.0 in | Wt 221.2 lb

## 2019-10-02 DIAGNOSIS — G8110 Spastic hemiplegia affecting unspecified side: Secondary | ICD-10-CM | POA: Insufficient documentation

## 2019-10-02 NOTE — Patient Instructions (Signed)

## 2019-10-02 NOTE — Progress Notes (Signed)
Botox Injection for spasticity using needle EMG guidance  Dilution: 50 Units/ml Indication: Severe spasticity which interferes with ADL,mobility and/or  hygiene and is unresponsive to medication management and other conservative care Informed consent was obtained after describing risks and benefits of the procedure with the patient. This includes bleeding, bruising, infection, excessive weakness, or medication side effects. A REMS form is on file and signed. Needle: 27g 1" needle electrode Number of units per muscle Biceps100 3+ Brachialis 100 3+  Brachioradialis 50U  3+ FCR25 2+  FDS25 2+ FDP25 2+ PT50 2+ PQ 25 1+ All injections were done after obtaining appropriate EMG activity and after negative drawback for blood. The patient tolerated the procedure well. Post procedure instructions were given. A followup appointment was made.

## 2019-10-21 ENCOUNTER — Other Ambulatory Visit: Payer: Self-pay | Admitting: Family Medicine

## 2019-10-21 DIAGNOSIS — E785 Hyperlipidemia, unspecified: Secondary | ICD-10-CM

## 2019-10-21 DIAGNOSIS — I700 Atherosclerosis of aorta: Secondary | ICD-10-CM

## 2019-10-21 DIAGNOSIS — I10 Essential (primary) hypertension: Secondary | ICD-10-CM

## 2019-10-26 ENCOUNTER — Other Ambulatory Visit: Payer: Self-pay

## 2019-10-26 ENCOUNTER — Encounter: Payer: Self-pay | Admitting: Family Medicine

## 2019-10-26 ENCOUNTER — Ambulatory Visit (INDEPENDENT_AMBULATORY_CARE_PROVIDER_SITE_OTHER): Payer: Medicare Other | Admitting: Family Medicine

## 2019-10-26 VITALS — BP 142/82 | HR 71 | Temp 97.8°F | Resp 14 | Ht 71.0 in | Wt 219.9 lb

## 2019-10-26 DIAGNOSIS — E785 Hyperlipidemia, unspecified: Secondary | ICD-10-CM

## 2019-10-26 DIAGNOSIS — I700 Atherosclerosis of aorta: Secondary | ICD-10-CM

## 2019-10-26 DIAGNOSIS — G8110 Spastic hemiplegia affecting unspecified side: Secondary | ICD-10-CM

## 2019-10-26 DIAGNOSIS — R7303 Prediabetes: Secondary | ICD-10-CM

## 2019-10-26 DIAGNOSIS — I10 Essential (primary) hypertension: Secondary | ICD-10-CM

## 2019-10-26 DIAGNOSIS — I6523 Occlusion and stenosis of bilateral carotid arteries: Secondary | ICD-10-CM | POA: Diagnosis not present

## 2019-10-26 DIAGNOSIS — E039 Hypothyroidism, unspecified: Secondary | ICD-10-CM

## 2019-10-26 NOTE — Progress Notes (Signed)
Name: Tennessee Perra   MRN: [id number]    DOB: 10/22/1955   Date:10/26/2019       Progress Note  Chief Complaint  Patient presents with  . Follow-up  . Hypertension  . Hyperlipidemia  . Prediabetes     Subjective:   [name] is a 64 y.o. male, presents to clinic for routine follow up on the conditions listed above.  Hypertension:  Pt has been checking BP in the morning and BP readings have been higher 137/90 in am before taking - later in the day diastolic is better in the 80's.  Currently managed on norvasc 10, losartan 100 mg, coreg 6.25 mg BID,  Pt reports good med compliance and denies any SE.  No lightheadedness, hypotension, syncope. Blood pressure today is well controlled for age BP Readings from Last 3 Encounters:  10/26/19 (!) 142/82  10/02/19 (!) 174/91  07/24/19 122/78  Pt denies CP, SOB, exertional sx, LE edema, palpitation, Ha's, visual disturbances He is exercising and active - rides a stationary bike and walks about 15,000 steps a day very active if still he feels really good and has not had any exertional symptoms or strokelike symptoms.  Hyperlipidemia: Current Medication Regimen:  crestor 40 mg and omega 3 fatty acids Last Lipids: Lab Results  Component Value Date   CHOL 156 06/29/2018   HDL 48 06/29/2018   LDLCALC 94 06/29/2018   TRIG 46 06/29/2018   CHOLHDL 3.3 06/29/2018  - Current Diet:  Healthy, since stroke eats fruits avoids pork - Denies: Chest pain, shortness of breath, myalgias, no claudication sx - Documented aortic atherosclerosis? Yes - Risk factors for atherosclerosis: cerebral vascular disease, hypercholesterolemia and hypertension   Hx of stroke in 2015 with spastic hemiplegia affecting dominant side- on ASA 325, statin and omega-3 fatty acid supplement Does follow with Vascular specialists he says for checking his legs, abdominal aorta and carotids?  History of prediabetes last A1c was slightly increased to 6.4 -he states  that he was told it was getting better, he has letter questions about what his blood sugar should be and what A1c is, diabetes and prediabetes education done today, handouts given, all questions asked and answered. Lab Results  Component Value Date   HGBA1C 6.4 (H) 06/29/2018   Labs for all conditions have not been done since last February about 16 months ago.  History of hypothyroid per chart, not currently on levothyroxine, last TSH was normal Lab Results  Component Value Date   TSH 2.77 06/29/2018  With our previous office visit in March 2021 we did order a follow-up TSH -patient will have labs done today.    Patient Active Problem List   Diagnosis Date Noted  . Obesity (BMI 30.0-34.9) 03/10/2018  . Abnormal EKG 12/15/2017  . Hypothyroidism 03/09/2017  . Abdominal aortic atherosclerosis ([place]) 05/20/2016  . Pre-diabetes 02/18/2015  . Atherosclerosis of both carotid arteries 02/18/2015  . 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 Shoulder subluxation, right 04/23/2014  . Spastic hemiplegia affecting dominant side ([place]) 02/18/2014  . Hypertension goal BP (blood pressure) < 140/90 01/17/2014  . Hyperlipidemia LDL goal <70 01/17/2014    Past Surgical History:  Procedure Laterality Date  . HYDROCELE EXCISION Left 01/18/2018   Procedure: HYDROCELECTOMY ADULT;  Surgeon: Hollice Espy, MD;  Location: [facility] ORS;  Service: Urology;  Laterality: Left;  . INGUINAL HERNIA REPAIR Bilateral 06/25/2016   Procedure: HERNIA REPAIR INGUINAL ADULT BILATERAL;  Surgeon: Christene Lye, MD;  Location: [facility] ORS;  Service:  General;  Laterality: Bilateral;  . KNEE ARTHROSCOPY Right 1970's    Family History  Problem Relation Age of Onset  . Hypertension Mother   . Hernia Sister   . Prostate cancer Neg Hx   . Bladder Cancer Neg Hx   . Kidney cancer Neg Hx     Social History   Tobacco Use  . Smoking status: Never Smoker  . Smokeless tobacco: Never Used  Substance Use Topics  .  Alcohol use: No    Alcohol/week: 0.0 standard drinks  . Drug use: No    Comment: used to use THC in the late 1980s      Current Outpatient Medications:  .  acetaminophen (TYLENOL) 500 MG tablet, Take 1,000 mg by mouth daily as needed for moderate pain., Disp: , Rfl:  .  amLODipine (NORVASC) 10 MG tablet, TAKE 1 TABLET BY MOUTH  DAILY, Disp: 90 tablet, Rfl: 3 .  aspirin EC 325 MG tablet, TAKE 1 TABLET (325 MG TOTAL) BY MOUTH DAILY., Disp: 90 tablet, Rfl: 3 .  carvedilol (COREG) 6.25 MG tablet, TAKE 1 TABLET BY MOUTH  TWICE DAILY WITH A MEAL, Disp: 180 tablet, Rfl: 3 .  cloNIDine (CATAPRES) 0.1 MG tablet, Take 0.1 mg by mouth as needed. , Disp: , Rfl:  .  losartan (COZAAR) 100 MG tablet, TAKE 1 TABLET BY MOUTH  DAILY, Disp: 90 tablet, Rfl: 3 .  Multiple Vitamins-Minerals (ONE-A-DAY MENS HEALTH FORMULA) TABS, Take 1 tablet by mouth daily., Disp: , Rfl:  .  Naphazoline-Hypromellose (TGT LUBRICANT REDNESS RELIEVER OP), Place 1 drop into both eyes daily as needed (redness/ dryness)., Disp: , Rfl:  .  Omega-3 Fatty Acids (FISH OIL) 1000 MG CAPS, Take 1,000 mg by mouth daily., Disp: , Rfl:  .  rosuvastatin (CRESTOR) 40 MG tablet, TAKE 1 TABLET BY MOUTH AT  BEDTIME FOR CHOLESTEROL, Disp: 90 tablet, Rfl: 3  No Known Allergies  Chart Review Today: I personally reviewed active problem list, medication list, allergies, family history, social history, health maintenance, notes from last encounter, lab results, imaging with the patient/caregiver today.   Review of Systems  10 Systems reviewed and are negative for acute change except as noted in the HPI.  Objective:    Vitals:   10/26/19 1409  BP: (!) 142/82  Pulse: 71  Resp: 14  Temp: 97.8 F (36.6 C)  SpO2: 98%  Weight: 219 lb 14.4 oz (99.7 kg)  Height: 5\' 11"  (1.803 m)    Body mass index is 30.67 kg/m.  Physical Exam Vitals and nursing note reviewed.  Constitutional:      General: He is not in acute distress.    Appearance:  Normal appearance. He is not ill-appearing, toxic-appearing or diaphoretic.  Eyes:     General:        Right eye: No discharge.        Left eye: No discharge.     Conjunctiva/sclera: Conjunctivae normal.  Cardiovascular:     Rate and Rhythm: Normal rate and regular rhythm.     Pulses: Normal pulses.     Heart sounds: Normal heart sounds. No murmur. No friction rub. No gallop.   Pulmonary:     Effort: Pulmonary effort is normal. No respiratory distress.     Breath sounds: Normal breath sounds. No stridor. No wheezing, rhonchi or rales.  Abdominal:     General: Bowel sounds are normal. There is no distension.     Palpations: Abdomen is soft.  Musculoskeletal:     Right  lower leg: No edema.     Left lower leg: No edema.  Neurological:     Mental Status: He is alert. Mental status is at baseline.     Comments: Right sided hemiparesis  Psychiatric:        Mood and Affect: Mood normal.        Behavior: Behavior normal.        PHQ2/9: Depression screen [facility] 2/9 10/26/2019 04/06/2019 01/10/2019 03/10/2018 12/19/2017  Decreased Interest 0 0 0 0 0  Down, Depressed, Hopeless 0 0 0 0 0  PHQ - 2 Score 0 0 0 0 0  Altered sleeping 0 - 0 - -  Tired, decreased energy 0 - 0 - -  Change in appetite 0 - 0 - -  Feeling bad or failure about yourself  0 - 0 - -  Trouble concentrating 0 - 0 - -  Moving slowly or fidgety/restless 0 - 0 - -  Suicidal thoughts 0 - 0 - -  PHQ-9 Score 0 - 0 - -  Difficult doing work/chores Not difficult at all - Not difficult at all - -  Some recent data might be hidden    phq 9 is neg, reviewed  Fall Risk: Fall Risk  10/26/2019 07/24/2019 06/08/2019 04/06/2019 02/08/2019  Falls in the past year? 0 0 0 0 0  Number falls in past yr: 0 0 - 0 -  Injury with Fall? 0 0 - 0 -  Risk for fall due to : - - - - -  Follow up - - - Falls prevention discussed -    Functional Status Survey: Is the patient deaf or have difficulty hearing?: No Does the patient have difficulty  seeing, even when wearing glasses/contacts?: No Does the patient have difficulty concentrating, remembering, or making decisions?: No Does the patient have difficulty walking or climbing stairs?: No Does the patient have difficulty dressing or bathing?: No Does the patient have difficulty doing errands alone such as visiting a doctor's office or shopping?: No   Assessment & Plan:     Pt presents for f/up today on routine conditions, pt previously did virtual visits and labs ordered at 07/24/2019 OV have not been completed yet - last labs done from 06/2018 Labs below will be completed today  1. Hypertension goal BP (blood pressure) < 140/90  I10 COMPLETE METABOLIC PANEL WITH GFR   stable, well controlled  2. Hypothyroidism, unspecified type  E03.9 TSH   past abnormal labs, not currently on meds, recheck  3. Hyperlipidemia LDL goal <70  E78.5 COMPLETE METABOLIC PANEL WITH GFR    Lipid panel   compliant with statin, tolerating, working on diet and does a lot of walking, recheck labs  4. Spastic hemiplegia affecting dominant side (HCC)  G81.10   5. Pre-diabetes  R73.03 COMPLETE METABOLIC PANEL WITH GFR    Hemoglobin A1c   recheck A1C, has been gradually worsening, last A1c was 6.4, no current sx of new on set DM or hyperglycemia  6. Abdominal aortic atherosclerosis (HCC)  I70.0    on statin, monitoring  7. History of stroke  Z86.73   8. Encounter for medication monitoring  Z51.81 CBC with Differential/Platelet    COMPLETE METABOLIC PANEL WITH GFR    Lipid panel    Hemoglobin A1c    TSH    1. Hyperlipidemia LDL goal <70 Compliant with meds, no SE, no myalgias, fatigue or jaundice, no claudication symptoms History of carotid atherosclerosis, stroke, abdominal aortic atherosclerosis, following with vascular  On aspirin 325 Due for FLP and recheck CMP   2. Hypertension goal BP (blood pressure) < 140/90 Blood pressure just slightly elevated today but with home  monitoring do feel his blood pressure is well controlled and stable today was 142/82 at home he is having most of his blood pressure readings prior to his medications be in the 130/90s and later in the day when rechecking blood pressure after taking morning meds BP is 120-130/80s No concerning sx Pt is very active w/o any exertional sx Feel overall well controlled -discussed home blood pressure monitoring, how to take his blood pressure correctly at home, he was given a blood pressure chart if he would like to utilize it to help him record and average his readings, for his age I do feel that a blood pressure goal of staying under 140/90 is appropriate  3. Spastic hemiplegia affecting dominant side (HCC) Stable  4. Atherosclerosis of both carotid arteries On statin, no new symptoms or concerns, does have monitoring with vascular  5. Pre-diabetes Diabetes and prediabetes education done today, asked Thermon Leyland to patient his lab work over the last several years and gave him handouts and educated him about diabetes versus prediabetes, A1c goals  6. Abdominal aortic atherosclerosis (HCC) On statin, monitoring  7. Hypothyroidism, unspecified type Rechecking labs today, not recently on thyroid medications   Return for 4 month routine f/up prediabetes HTN HLD.   Danelle Berry, PA-C 10/26/19 2:22 PM

## 2019-10-26 NOTE — Patient Instructions (Signed)
  Blood Pressure Record Sheet  Blood pressure log Date: _______________________  a.m. _____________________(1st reading) _____________________(2nd reading)  p.m. _____________________(1st reading) _____________________(2nd reading) Date: _______________________  a.m. _____________________(1st reading) _____________________(2nd reading)  p.m. _____________________(1st reading) _____________________(2nd reading) Date: _______________________  a.m. _____________________(1st reading) _____________________(2nd reading)  p.m. _____________________(1st reading) _____________________(2nd reading) Date: _______________________  a.m. _____________________(1st reading) _____________________(2nd reading)  p.m. _____________________(1st reading) _____________________(2nd reading) Date: _______________________  a.m. _____________________(1st reading) _____________________(2nd reading)  p.m. _____________________(1st reading) _____________________(2nd reading) Date: _______________________  a.m. _____________________(1st reading) _____________________(2nd reading)  p.m. _____________________(1st reading) _____________________(2nd reading) Date: _______________________  a.m. _____________________(1st reading) _____________________(2nd reading)  p.m. _____________________(1st reading) _____________________(2nd reading) Date: _______________________  a.m. _____________________(1st reading) _____________________(2nd reading)  p.m. _____________________(1st reading) _____________________(2nd reading) Date: _______________________  a.m. _____________________(1st reading) _____________________(2nd reading)  p.m. _____________________(1st reading) _____________________(2nd reading) Date: _______________________  a.m. _____________________(1st reading) _____________________(2nd reading)  p.m. _____________________(1st reading) _____________________(2nd reading)      How to Take  Your Blood Pressure You can take your blood pressure at home with a machine. You may need to check your blood pressure at home:  To check if you have high blood pressure (hypertension).  To check your blood pressure over time.  To make sure your blood pressure medicine is working. Supplies needed: You will need a blood pressure machine, or monitor. You can buy one at a drugstore or online. When choosing one:  Choose one with an arm cuff.  Choose one that wraps around your upper arm. Only one finger should fit between your arm and the cuff.  Do not choose one that measures your blood pressure from your wrist or finger. Your doctor can suggest a monitor. How to prepare Avoid these things for 30 minutes before checking your blood pressure:  Drinking caffeine.  Drinking alcohol.  Eating.  Smoking.  Exercising. Five minutes before checking your blood pressure:  Pee.  Sit in a dining chair. Avoid sitting in a soft couch or armchair.  Be quiet. Do not talk. How to take your blood pressure Follow the instructions that came with your machine. If you have a digital blood pressure monitor, these may be the instructions: 1. Sit up straight. 2. Place your feet on the floor. Do not cross your ankles or legs. 3. Rest your left arm at the level of your heart. You may rest it on a table, desk, or chair. 4. Pull up your shirt sleeve. 5. Wrap the blood pressure cuff around the upper part of your left arm. The cuff should be 1 inch (2.5 cm) above your elbow. It is best to wrap the cuff around bare skin. 6. Fit the cuff snugly around your arm. You should be able to place only one finger between the cuff and your arm. 7. Put the cord inside the groove of your elbow. 8. Press the power button. 9. Sit quietly while the cuff fills with air and loses air. 10. Write down the numbers on the screen. 11. Wait 2-3 minutes and then repeat steps 1-10. What do the numbers mean? Two numbers make up  your blood pressure. The first number is called systolic pressure. The second is called diastolic pressure. An example of a blood pressure reading is "120 over 80" (or 120/80). If you are an adult and do not have a medical condition, use this guide to find out if your blood pressure is normal: Normal  First number: below 120.  Second number: below 80. Elevated  First number: 120-129.  Second number: below 80. Hypertension stage 1  First   number: 130-139.  Second number: 80-89. Hypertension stage 2  First number: 140 or above.  Second number: 90 or above. Your blood pressure is above normal even if only the top or bottom number is above normal. Follow these instructions at home:  Check your blood pressure as often as your doctor tells you to.  Take your monitor to your next doctor's appointment. Your doctor will: ? Make sure you are using it correctly. ? Make sure it is working right.  Make sure you understand what your blood pressure numbers should be.  Tell your doctor if your medicines are causing side effects. Contact a doctor if:  Your blood pressure keeps being high. Get help right away if:  Your first blood pressure number is higher than 180.  Your second blood pressure number is higher than 120. This information is not intended to replace advice given to you by your health care provider. Make sure you discuss any questions you have with your health care provider. Document Revised: 04/22/2017 Document Reviewed: 10/17/2015 Elsevier Patient Education  2020 [employer]     

## 2019-10-27 LAB — TSH: TSH: 3.65 mIU/L (ref 0.40–4.50)

## 2019-10-27 LAB — COMPLETE METABOLIC PANEL WITH GFR
AG Ratio: 1.6 (calc) (ref 1.0–2.5)
ALT: 24 U/L (ref 9–46)
AST: 21 U/L (ref 10–35)
Albumin: 4.3 g/dL (ref 3.6–5.1)
Alkaline phosphatase (APISO): 65 U/L (ref 35–144)
BUN: 13 mg/dL (ref 7–25)
CO2: 27 mmol/L (ref 20–32)
Calcium: 9.4 mg/dL (ref 8.6–10.3)
Chloride: 104 mmol/L (ref 98–110)
Creat: 1.02 mg/dL (ref 0.70–1.25)
GFR, Est African American: 89 mL/min/{1.73_m2} (ref >=60)
GFR, Est Non African American: 77 mL/min/{1.73_m2} (ref >=60)
Globulin: 2.7 g/dL (calc) (ref 1.9–3.7)
Glucose, Bld: 95 mg/dL (ref 65–99)
Potassium: 4.1 mmol/L (ref 3.5–5.3)
Sodium: 139 mmol/L (ref 135–146)
Total Bilirubin: 0.5 mg/dL (ref 0.2–1.2)
Total Protein: 7 g/dL (ref 6.1–8.1)

## 2019-10-27 LAB — LIPID PANEL
Cholesterol: 155 mg/dL (ref <200)
HDL: 52 mg/dL (ref >=40)
LDL Cholesterol (Calc): 89 mg/dL (calc)
Non-HDL Cholesterol (Calc): 103 mg/dL (calc) (ref <130)
Total CHOL/HDL Ratio: 3 (calc) (ref <5.0)
Triglycerides: 46 mg/dL (ref <150)

## 2019-10-27 LAB — CBC WITH DIFFERENTIAL/PLATELET
Absolute Monocytes: 361 cells/uL (ref 200–950)
Basophils Absolute: 42 cells/uL (ref 0–200)
Basophils Relative: 1 %
Eosinophils Absolute: 122 cells/uL (ref 15–500)
Eosinophils Relative: 2.9 %
HCT: 43.7 % (ref 38.5–50.0)
Hemoglobin: 14.2 g/dL (ref 13.2–17.1)
Lymphs Abs: 1420 cells/uL (ref 850–3900)
MCH: 28.3 pg (ref 27.0–33.0)
MCHC: 32.5 g/dL (ref 32.0–36.0)
MCV: 87.1 fL (ref 80.0–100.0)
MPV: 11 fL (ref 7.5–12.5)
Monocytes Relative: 8.6 %
Neutro Abs: 2255 cells/uL (ref 1500–7800)
Neutrophils Relative %: 53.7 %
Platelets: 234 10*3/uL (ref 140–400)
RBC: 5.02 10*6/uL (ref 4.20–5.80)
RDW: 13.3 % (ref 11.0–15.0)
Total Lymphocyte: 33.8 %
WBC: 4.2 10*3/uL (ref 3.8–10.8)

## 2019-10-27 LAB — HEMOGLOBIN A1C
Hgb A1c MFr Bld: 5.8 % of total Hgb — ABNORMAL HIGH (ref <5.7)
Mean Plasma Glucose: 120 (calc)
eAG (mmol/L): 6.6 (calc)

## 2019-11-13 ENCOUNTER — Ambulatory Visit: Payer: Medicare Other | Admitting: Physical Medicine & Rehabilitation

## 2020-01-03 ENCOUNTER — Encounter: Payer: Medicare Other | Attending: Physical Medicine & Rehabilitation | Admitting: Physical Medicine & Rehabilitation

## 2020-01-03 ENCOUNTER — Other Ambulatory Visit: Payer: Self-pay

## 2020-01-03 ENCOUNTER — Encounter: Payer: Self-pay | Admitting: Physical Medicine & Rehabilitation

## 2020-01-03 VITALS — BP 170/90 | HR 94 | Temp 98.6°F | Ht 71.0 in | Wt 219.0 lb

## 2020-01-03 DIAGNOSIS — G8110 Spastic hemiplegia affecting unspecified side: Secondary | ICD-10-CM | POA: Insufficient documentation

## 2020-01-03 NOTE — Patient Instructions (Signed)

## 2020-01-03 NOTE — Progress Notes (Signed)
Botox Injection for spasticity using needle EMG guidance  Dilution: 50 Units/ml Indication: Severe spasticity which interferes with ADL,mobility and/or  hygiene and is unresponsive to medication management and other conservative care Informed consent was obtained after describing risks and benefits of the procedure with the patient. This includes bleeding, bruising, infection, excessive weakness, or medication side effects. A REMS form is on file and signed. Needle: 27g 1" needle electrode Number of units per muscle Biceps100 3+ Brachialis 100 3+  Brachioradialis 50U  3+ FCR50 2+  FDS50 2+ FDP50 2+ PT50 2+ PQ 0 0+ All injections were done after obtaining appropriate EMG activity and after negative drawback for blood. The patient tolerated the procedure well. Post procedure instructions were given. A followup appointment was made.

## 2020-02-22 NOTE — Progress Notes (Signed)
Name: [name]   MRN: [id number]    DOB: 10/27/1955   Date:02/25/2020       Progress Note  Chief Complaint  Patient presents with  . Hypertension  . Hyperlipidemia     Subjective:   [name] is a 64 y.o. male, presents to clinic for HTN and HLD f/up  Hyperlipidemia: Currently treated with Crestor 40 mg, pt reports good med compliance Last Lipids: Lab Results  Component Value Date   CHOL 155 10/26/2019   HDL 52 10/26/2019   LDLCALC 89 10/26/2019   TRIG 46 10/26/2019   CHOLHDL 3.0 10/26/2019   - Denies: Chest pain, shortness of breath, myalgias, claudication  Hypertension:  Currently managed on Amlodipinie 10 mg qd, Coreg 6.25 mg bid, Losartan 100 mg qd Clonidine 0.1 mg PRN - prescribed years ago Pt reports good med compliance and denies any SE.   Blood pressure today is well controlled.   Higher readings when getting tx/botox/procedures with specialists BP Readings from Last 5 Encounters:  02/25/20 136/76  01/03/20 (!) 170/90  10/26/19 (!) 142/82  10/02/19 (!) 174/91  07/24/19 122/78  Pt denies CP, SOB, exertional sx, LE edema, palpitation, Ha's, visual disturbances, lightheadedness, hypotension, syncope.  Higher BP at physical medicine and rehab - better today     Current Outpatient Medications:  .  acetaminophen (TYLENOL) 500 MG tablet, Take 1,000 mg by mouth daily as needed for moderate pain., Disp: , Rfl:  .  amLODipine (NORVASC) 10 MG tablet, TAKE 1 TABLET BY MOUTH  DAILY, Disp: 90 tablet, Rfl: 3 .  aspirin EC 325 MG tablet, TAKE 1 TABLET (325 MG TOTAL) BY MOUTH DAILY., Disp: 90 tablet, Rfl: 3 .  carvedilol (COREG) 6.25 MG tablet, TAKE 1 TABLET BY MOUTH  TWICE DAILY WITH A MEAL, Disp: 180 tablet, Rfl: 3 .  cloNIDine (CATAPRES) 0.1 MG tablet, Take 0.1 mg by mouth as needed. , Disp: , Rfl:  .  losartan (COZAAR) 100 MG tablet, TAKE 1 TABLET BY MOUTH  DAILY, Disp: 90 tablet, Rfl: 3 .  Multiple Vitamins-Minerals (ONE-A-DAY MENS HEALTH FORMULA) TABS,  Take 1 tablet by mouth daily., Disp: , Rfl:  .  Naphazoline-Hypromellose (TGT LUBRICANT REDNESS RELIEVER OP), Place 1 drop into both eyes daily as needed (redness/ dryness)., Disp: , Rfl:  .  Omega-3 Fatty Acids (FISH OIL) 1000 MG CAPS, Take 1,000 mg by mouth daily., Disp: , Rfl:  .  rosuvastatin (CRESTOR) 40 MG tablet, TAKE 1 TABLET BY MOUTH AT  BEDTIME FOR CHOLESTEROL, Disp: 90 tablet, Rfl: 3  Patient Active Problem List   Diagnosis Date Noted  . Obesity (BMI 30.0-34.9) 03/10/2018  . Abnormal EKG 12/15/2017  . Hypothyroidism 03/09/2017  . Abdominal aortic atherosclerosis (HCC) 05/20/2016  . Pre-diabetes 02/18/2015  . Atherosclerosis of both carotid arteries 02/18/2015  . 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 Shoulder subluxation, right 04/23/2014  . Spastic hemiplegia affecting dominant side (HCC) 02/18/2014  . Hypertension goal BP (blood pressure) < 140/90 01/17/2014  . Hyperlipidemia LDL goal <70 01/17/2014    Past Surgical History:  Procedure Laterality Date  . HYDROCELE EXCISION Left 01/18/2018   Procedure: HYDROCELECTOMY ADULT;  Surgeon: Vanna Scotland, MD;  Location: [facility] ORS;  Service: Urology;  Laterality: Left;  . INGUINAL HERNIA REPAIR Bilateral 06/25/2016   Procedure: HERNIA REPAIR INGUINAL ADULT BILATERAL;  Surgeon: Kieth Brightly, MD;  Location: [facility] OR[facility];  Service: General;  Laterality: Bilateral;  . KNEE ARTHROSCOPY Right 1970's    Family History  Problem Relation  Age of Onset  . Hypertension Mother   . Hernia Sister   . Prostate cancer Neg Hx   . Bladder Cancer Neg Hx   . Kidney cancer Neg Hx     Social History   Tobacco Use  . Smoking status: Never Smoker  . Smokeless tobacco: Never Used  Vaping Use  . Vaping Use: Never used  Substance Use Topics  . Alcohol use: No    Alcohol/week: 0.0 standard drinks  . Drug use: No    Comment: used to use THC in the late 1980s     No Known Allergies  Health Maintenance  Topic Date Due  .  Fecal DNA (Cologuard)  Never done  . PNA vac Low Risk Adult (1 of 2 - PCV13) Never done  . Hepatitis C Screening  10/25/2020 (Originally July 07, 1955)  . HIV Screening  10/25/2020 (Originally 06/30/1970)  . TETANUS/TDAP  07/20/2025  . INFLUENZA VACCINE  Completed  . COVID-19 Vaccine  Completed    Chart Review Today: I personally reviewed active problem list, medication list, allergies, family history, social history, health maintenance, notes from last encounter, lab results, imaging with the patient/caregiver today.   Review of Systems  10 Systems reviewed and are negative for acute change except as noted in the HPI.  Objective:   Vitals:   02/25/20 1317  BP: 136/76  Pulse: 62  Resp: 18  Temp: 98 F (36.7 C)  TempSrc: Oral  SpO2: 99%  Weight: 215 lb 9.6 oz (97.8 kg)  Height: 5\' 11"  (1.803 m)    Body mass index is 30.07 kg/m.  Physical Exam Vitals and nursing note reviewed.  Constitutional:      General: He is not in acute distress.    Appearance: Normal appearance. He is well-developed. He is not ill-appearing, toxic-appearing or diaphoretic.     Interventions: Face mask in place.  HENT:     Head: Normocephalic and atraumatic.     Jaw: No trismus.     Right Ear: External ear normal.     Left Ear: External ear normal.  Eyes:     General: Lids are normal. No scleral icterus.       Right eye: No discharge.        Left eye: No discharge.     Conjunctiva/sclera: Conjunctivae normal.  Neck:     Trachea: Trachea and phonation normal. No tracheal deviation.  Cardiovascular:     Rate and Rhythm: Normal rate and regular rhythm.     Pulses: Normal pulses.          Radial pulses are 2+ on the right side and 2+ on the left side.       Posterior tibial pulses are 2+ on the right side and 2+ on the left side.     Heart sounds: Normal heart sounds. No murmur heard.  No friction rub. No gallop.   Pulmonary:     Effort: Pulmonary effort is normal. No respiratory distress.      Breath sounds: Normal breath sounds. No stridor. No wheezing, rhonchi or rales.  Abdominal:     General: Bowel sounds are normal. There is no distension.     Palpations: Abdomen is soft.  Musculoskeletal:     Right lower leg: No edema.     Left lower leg: No edema.  Skin:    General: Skin is warm and dry.     Coloration: Skin is not jaundiced.     Findings: No rash.  Nails: There is no clubbing.  Neurological:     Mental Status: He is alert. Mental status is at baseline.     Cranial Nerves: No dysarthria or facial asymmetry.     Motor: No tremor or abnormal muscle tone.     Gait: Gait normal.  Psychiatric:        Mood and Affect: Mood normal.        Speech: Speech normal.        Behavior: Behavior normal. Behavior is cooperative.         Assessment & Plan:      ICD-10-CM   1. Hypertension goal BP (blood pressure) < 140/90  I10    well controlled today, hx of being uncontrolled - usually at other appts with procedures, likely due to stress, con't to monitor at home, con't same meds  2. Hyperlipidemia LDL goal <70  E78.5    compliant with statin, works on [employer] and activity as able/tolerated  3. Need for influenza vaccination  Z23 Flu Vaccine QUAD High Dose(Fluad)  4. Screening for malignant neoplasm of colon  Z12.11 Cologuard    Will see patient can monitor his blood pressure at home or occasionally when relaxed, given information about how to properly check his blood pressure, given a blood pressure log, I do think his high readings are likely because of his appointments and some stress involved with the procedures and office visits that he is going to for those, today his blood pressure is well controlled, I do not think we need to adjust any medications at this time.  Will monitor average blood pressures over a few weeks and have him follow-up of his blood pressure is over 140/90.  I did reassure him that higher blood pressures are normal to have occasionally and as  long as he is asymptomatic and blood pressure returns to goal at home he does not need to worry about it.  Reviewed concerning symptoms that he should watch for.  Handout given with AVS today, continue all medications that he is currently on.  Reviewed his recent labs which were only a few months ago, not due for lab work, we will recheck lab work at his next routine office visit   Return in about 6 months (around 08/25/2020) for Routine follow-up.   Danelle Berry, PA-C 02/25/20 1:23 PM

## 2020-02-25 ENCOUNTER — Ambulatory Visit (INDEPENDENT_AMBULATORY_CARE_PROVIDER_SITE_OTHER): Payer: Medicare Other | Admitting: Family Medicine

## 2020-02-25 ENCOUNTER — Encounter: Payer: Self-pay | Admitting: Family Medicine

## 2020-02-25 ENCOUNTER — Other Ambulatory Visit: Payer: Self-pay

## 2020-02-25 VITALS — BP 136/76 | HR 62 | Temp 98.0°F | Resp 18 | Ht 71.0 in | Wt 215.6 lb

## 2020-02-25 DIAGNOSIS — E785 Hyperlipidemia, unspecified: Secondary | ICD-10-CM

## 2020-02-25 DIAGNOSIS — I10 Essential (primary) hypertension: Secondary | ICD-10-CM | POA: Diagnosis not present

## 2020-02-25 DIAGNOSIS — Z23 Encounter for immunization: Secondary | ICD-10-CM | POA: Diagnosis not present

## 2020-02-25 DIAGNOSIS — Z1211 Encounter for screening for malignant neoplasm of colon: Secondary | ICD-10-CM | POA: Diagnosis not present

## 2020-02-25 NOTE — Patient Instructions (Signed)
Higher BP readings are okay as long as you're not having any symptoms and as long as the Blood pressure goes back to normal at home.   If on average your blood pressure is over 150/90 then get a sooner follow up appointment to discuss and recheck.    Managing Your Hypertension Hypertension is commonly called high blood pressure. This is when the force of your blood pressing against the walls of your arteries is too strong. Arteries are blood vessels that carry blood from your heart throughout your body. Hypertension forces the heart to work harder to pump blood, and may cause the arteries to become narrow or stiff. Having untreated or uncontrolled hypertension can cause heart attack, stroke, kidney disease, and other problems. What are blood pressure readings? A blood pressure reading consists of a higher number over a lower number. Ideally, your blood pressure should be below 120/80. The first ("top") number is called the systolic pressure. It is a measure of the pressure in your arteries as your heart beats. The second ("bottom") number is called the diastolic pressure. It is a measure of the pressure in your arteries as the heart relaxes. What does my blood pressure reading mean? Blood pressure is classified into four stages. Based on your blood pressure reading, your health care provider may use the following stages to determine what type of treatment you need, if any. Systolic pressure and diastolic pressure are measured in a unit called mm Hg. Normal  Systolic pressure: below 120.  Diastolic pressure: below 80. Elevated  Systolic pressure: 120-129.  Diastolic pressure: below 80. Hypertension stage 1  Systolic pressure: 130-139.  Diastolic pressure: 80-89. Hypertension stage 2  Systolic pressure: 140 or above.  Diastolic pressure: 90 or above. What health risks are associated with hypertension? Managing your hypertension is an important responsibility. Uncontrolled hypertension  can lead to:  A heart attack.  A stroke.  A weakened blood vessel (aneurysm).  Heart failure.  Kidney damage.  Eye damage.  Metabolic syndrome.  Memory and concentration problems. What changes can I make to manage my hypertension? Hypertension can be managed by making lifestyle changes and possibly by taking medicines. Your health care provider will help you make a plan to bring your blood pressure within a normal range. Eating and drinking   Eat a diet that is high in fiber and potassium, and low in salt (sodium), added sugar, and fat. An example eating plan is called the DASH (Dietary Approaches to Stop Hypertension) diet. To eat this way: ? Eat plenty of fresh fruits and vegetables. Try to fill half of your plate at each meal with fruits and vegetables. ? Eat whole grains, such as whole wheat pasta, brown rice, or whole grain bread. Fill about one quarter of your plate with whole grains. ? Eat low-fat diary products. ? Avoid fatty cuts of meat, processed or cured meats, and poultry with skin. Fill about one quarter of your plate with lean proteins such as fish, chicken without skin, beans, eggs, and tofu. ? Avoid premade and processed foods. These tend to be higher in sodium, added sugar, and fat.  Reduce your daily sodium intake. Most people with hypertension should eat less than 1,500 mg of sodium a day.  Limit alcohol intake to no more than 1 drink a day for nonpregnant women and 2 drinks a day for men. One drink equals 12 oz of beer, 5 oz of wine, or 1 oz of hard liquor. Lifestyle  Work with your health care  provider to maintain a healthy body weight, or to lose weight. Ask what an ideal weight is for you.  Get at least 30 minutes of exercise that causes your heart to beat faster (aerobic exercise) most days of the week. Activities may include walking, swimming, or biking.  Include exercise to strengthen your muscles (resistance exercise), such as weight lifting, as part  of your weekly exercise routine. Try to do these types of exercises for 30 minutes at least 3 days a week.  Do not use any products that contain nicotine or tobacco, such as cigarettes and e-cigarettes. If you need help quitting, ask your health care provider.  Control any long-term (chronic) conditions you have, such as high cholesterol or diabetes. Monitoring  Monitor your blood pressure at home as told by your health care provider. Your personal target blood pressure may vary depending on your medical conditions, your age, and other factors.  Have your blood pressure checked regularly, as often as told by your health care provider. Working with your health care provider  Review all the medicines you take with your health care provider because there may be side effects or interactions.  Talk with your health care provider about your diet, exercise habits, and other lifestyle factors that may be contributing to hypertension.  Visit your health care provider regularly. Your health care provider can help you create and adjust your plan for managing hypertension. Will I need medicine to control my blood pressure? Your health care provider may prescribe medicine if lifestyle changes are not enough to get your blood pressure under control, and if:  Your systolic blood pressure is 130 or higher.  Your diastolic blood pressure is 80 or higher. Take medicines only as told by your health care provider. Follow the directions carefully. Blood pressure medicines must be taken as prescribed. The medicine does not work as well when you skip doses. Skipping doses also puts you at risk for problems. Contact a health care provider if:  You think you are having a reaction to medicines you have taken.  You have repeated (recurrent) headaches.  You feel dizzy.  You have swelling in your ankles.  You have trouble with your vision. Get help right away if:  You develop a severe headache or  confusion.  You have unusual weakness or numbness, or you feel faint.  You have severe pain in your chest or abdomen.  You vomit repeatedly.  You have trouble breathing. Summary  Hypertension is when the force of blood pumping through your arteries is too strong. If this condition is not controlled, it may put you at risk for serious complications.  Your personal target blood pressure may vary depending on your medical conditions, your age, and other factors. For most people, a normal blood pressure is less than 120/80.  Hypertension is managed by lifestyle changes, medicines, or both. Lifestyle changes include weight loss, eating a healthy, low-sodium diet, exercising more, and limiting alcohol. This information is not intended to replace advice given to you by your health care provider. Make sure you discuss any questions you have with your health care provider. Document Revised: 09/01/2018 Document Reviewed: 04/07/2016 Elsevier Patient Education  2020 [employer].

## 2020-02-27 ENCOUNTER — Encounter: Payer: Self-pay | Admitting: Family Medicine

## 2020-03-08 ENCOUNTER — Ambulatory Visit: Payer: Medicare Other

## 2020-04-05 ENCOUNTER — Ambulatory Visit: Payer: Medicare Other | Attending: Internal Medicine

## 2020-04-05 DIAGNOSIS — Z23 Encounter for immunization: Secondary | ICD-10-CM

## 2020-04-05 NOTE — Progress Notes (Signed)
   Covid-19 Vaccination Clinic  Name:  [name]    MRN: [id number] DOB: Apr 03, 1956  04/05/2020  [name] was observed post Covid-19 immunization for 15 minutes without incident. He was provided with Vaccine Information Sheet and instruction to access the V-Safe system.   [name] was instructed to call 911 with any severe reactions post vaccine: Marland Kitchen Difficulty breathing  . Swelling of face and throat  . A fast heartbeat  . A bad rash all over body  . Dizziness and weakness

## 2020-04-08 ENCOUNTER — Encounter: Payer: Self-pay | Admitting: Physical Medicine & Rehabilitation

## 2020-04-08 ENCOUNTER — Encounter: Payer: Medicare Other | Attending: Physical Medicine & Rehabilitation | Admitting: Physical Medicine & Rehabilitation

## 2020-04-08 ENCOUNTER — Other Ambulatory Visit: Payer: Self-pay

## 2020-04-08 VITALS — BP 153/86 | HR 70 | Temp 97.9°F | Ht 71.0 in | Wt 220.8 lb

## 2020-04-08 DIAGNOSIS — G8110 Spastic hemiplegia affecting unspecified side: Secondary | ICD-10-CM

## 2020-04-08 NOTE — Patient Instructions (Signed)

## 2020-04-08 NOTE — Progress Notes (Signed)
Botox Injection for spasticity using needle EMG guidance  Dilution: 50 Units/ml Indication: Severe spasticity which interferes with ADL,mobility and/or  hygiene and is unresponsive to medication management and other conservative care Informed consent was obtained after describing risks and benefits of the procedure with the patient. This includes bleeding, bruising, infection, excessive weakness, or medication side effects. A REMS form is on file and signed. Needle: 27g 1" needle electrode Number of units per muscle Biceps100 3+ Brachialis 100 3+  Brachioradialis 50U  3+ FCR50 2+  FDS50 2+ FDP50 2+ PT50 2+ PQ 0 0+ All injections were done after obtaining appropriate EMG activity and after negative drawback for blood. The patient tolerated the procedure well. Post procedure instructions were given. A followup appointment was made.

## 2020-04-10 ENCOUNTER — Ambulatory Visit: Payer: Medicare Other

## 2020-04-20 ENCOUNTER — Emergency Department
Admission: EM | Admit: 2020-04-20 | Discharge: 2020-04-20 | Disposition: A | Discharge status: Home / Self Care | Payer: Medicare Other | Attending: Emergency Medicine | Admitting: Emergency Medicine

## 2020-04-20 ENCOUNTER — Encounter: Payer: Self-pay | Admitting: Emergency Medicine

## 2020-04-20 ENCOUNTER — Other Ambulatory Visit: Payer: Self-pay

## 2020-04-20 ENCOUNTER — Emergency Department: Payer: Medicare Other

## 2020-04-20 DIAGNOSIS — N189 Chronic kidney disease, unspecified: Secondary | ICD-10-CM | POA: Insufficient documentation

## 2020-04-20 DIAGNOSIS — Z79899 Other long term (current) drug therapy: Secondary | ICD-10-CM | POA: Diagnosis not present

## 2020-04-20 DIAGNOSIS — R42 Dizziness and giddiness: Secondary | ICD-10-CM | POA: Diagnosis present

## 2020-04-20 DIAGNOSIS — I10 Essential (primary) hypertension: Secondary | ICD-10-CM

## 2020-04-20 DIAGNOSIS — Z96659 Presence of unspecified artificial knee joint: Secondary | ICD-10-CM | POA: Insufficient documentation

## 2020-04-20 DIAGNOSIS — I129 Hypertensive chronic kidney disease with stage 1 through stage 4 chronic kidney disease, or unspecified chronic kidney disease: Secondary | ICD-10-CM | POA: Insufficient documentation

## 2020-04-20 DIAGNOSIS — E039 Hypothyroidism, unspecified: Secondary | ICD-10-CM | POA: Insufficient documentation

## 2020-04-20 DIAGNOSIS — Z7982 Long term (current) use of aspirin: Secondary | ICD-10-CM | POA: Diagnosis not present

## 2020-04-20 LAB — CBC
HCT: 43.1 % (ref 39.0–52.0)
Hemoglobin: 14.1 g/dL (ref 13.0–17.0)
MCH: 27.9 pg (ref 26.0–34.0)
MCHC: 32.7 g/dL (ref 30.0–36.0)
MCV: 85.2 fL (ref 80.0–100.0)
Platelets: 266 10*3/uL (ref 150–400)
RBC: 5.06 MIL/uL (ref 4.22–5.81)
RDW: 13.2 % (ref 11.5–15.5)
WBC: 5.1 10*3/uL (ref 4.0–10.5)
nRBC: 0 % (ref 0.0–0.2)

## 2020-04-20 LAB — BASIC METABOLIC PANEL
Anion gap: 9 (ref 5–15)
BUN: 11 mg/dL (ref 8–23)
CO2: 25 mmol/L (ref 22–32)
Calcium: 9.3 mg/dL (ref 8.9–10.3)
Chloride: 103 mmol/L (ref 98–111)
Creatinine, Ser: 0.98 mg/dL (ref 0.61–1.24)
GFR, Estimated: >60 mL/min (ref >60)
Glucose, Bld: 162 mg/dL — ABNORMAL HIGH (ref 70–99)
Potassium: 3.7 mmol/L (ref 3.5–5.1)
Sodium: 137 mmol/L (ref 135–145)

## 2020-04-20 LAB — TROPONIN I (HIGH SENSITIVITY): Troponin I (High Sensitivity): 6 ng/L (ref <18)

## 2020-04-20 NOTE — ED Provider Notes (Signed)
[facility] Emergency Department Provider Note  ____________________________________________  Time seen: Approximately 12:40 PM  I have reviewed the triage vital signs and the nursing notes.   HISTORY  Chief Complaint Dizziness and Hypertension    HPI [name] is a 64 y.o. male with a history of AAA, left MCA stroke with residual right arm weakness , hypertension on losartan, amlodipine, and as needed clonidine who comes ED complaining of intermittent dizziness over the past 2 to 3 days, associated with elevated blood pressure.  No chest pain neck pain shortness of breath headache vision changes paresthesias or weakness.  No change in balance or coordination.  No aggravating or alleviating factors.  No syncope.  Last night he noted his blood pressure was as high as 190/100, took one of his clonidine which improved to 140/80, and then he went to bed.  However when he woke up this morning blood pressure was again elevated at about 190 systolic and he came to the ED for evaluation.   Past Medical History:  Diagnosis Date  . AAA (abdominal aortic aneurysm) (HCC)   . Bowel obstruction (HCC) 2016   during septic event. no further a problem once sepsis resolved  . 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 Chronic kidney disease 2016   happened after stroke. dr. Cherylann Ratel follows him once a year  . Hyperlipidemia   . Hypertension   . Inguinal hernia   . Mild atherosclerosis of carotid artery 12/2017   bilateral stenosis per vascular surgeon  . Pink eye 06/21/2016  . S/P Botox injection    every 3 months into right arm to loosen up muscles  . Sepsis (HCC) 2016   happened after stroke. unsure when,but kidney disease started. followed by dr. Cherylann Ratel  . Stroke [facility]) 12/24/2013   right side weakness     Patient Active Problem List   Diagnosis Date Noted  . Obesity (BMI 30.0-34.9) 03/10/2018  . Abnormal EKG  12/15/2017  . Hypothyroidism 03/09/2017  . Abdominal aortic atherosclerosis (HCC) 05/20/2016  . Pre-diabetes 02/18/2015  . Atherosclerosis of both carotid arteries 02/18/2015  . 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 Shoulder subluxation, right 04/23/2014  . Spastic hemiplegia affecting dominant side (HCC) 02/18/2014  . Hypertension goal BP (blood pressure) < 140/90 01/17/2014  . Hyperlipidemia LDL goal <70 01/17/2014     Past Surgical History:  Procedure Laterality Date  . HYDROCELE EXCISION Left 01/18/2018   Procedure: HYDROCELECTOMY ADULT;  Surgeon: Vanna Scotland, MD;  Location: [facility] ORS;  Service: Urology;  Laterality: Left;  . INGUINAL HERNIA REPAIR Bilateral 06/25/2016   Procedure: HERNIA REPAIR INGUINAL ADULT BILATERAL;  Surgeon: Kieth Brightly, MD;  Location: [facility] ORS;  Service: General;  Laterality: Bilateral;  . KNEE ARTHROSCOPY Right 1970's     Prior to Admission medications   Medication Sig Start Date End Date Taking? Authorizing Provider  acetaminophen (TYLENOL) 500 MG tablet Take 1,000 mg by mouth daily as needed for moderate pain.    [provider]  amLODipine (NORVASC) 10 MG tablet TAKE 1 TABLET BY MOUTH  DAILY 10/22/19   Danelle Berry, PA-C  aspirin EC 325 MG tablet TAKE 1 TABLET (325 MG TOTAL) BY MOUTH DAILY. 03/06/18   Kerman Passey, MD  carvedilol (COREG) 6.25 MG tablet TAKE 1 TABLET BY MOUTH  TWICE DAILY WITH A MEAL 10/22/19   Danelle Berry, PA-C  cloNIDine (CATAPRES) 0.1 MG tablet Take 0.1 mg by mouth as needed.  08/25/17   [provider]  losartan (COZAAR) 100 MG tablet TAKE 1 TABLET BY MOUTH  DAILY 10/22/19   Danelle Berryapia, Leisa, PA-C  Multiple Vitamins-Minerals (ONE-A-DAY MENS HEALTH FORMULA) TABS Take 1 tablet by mouth daily.    [provider]  Naphazoline-Hypromellose (TGT LUBRICANT REDNESS RELIEVER OP) Place 1 drop into both eyes daily as needed (redness/ dryness).    [provider]  Omega-3 Fatty Acids (FISH  OIL) 1000 MG CAPS Take 1,000 mg by mouth daily.    [provider]  rosuvastatin (CRESTOR) 40 MG tablet TAKE 1 TABLET BY MOUTH AT  BEDTIME FOR CHOLESTEROL 10/22/19   Danelle Berryapia, Leisa, PA-C     Allergies Patient has no known allergies.   Family History  Problem Relation Age of Onset  . Hypertension Mother   . Hernia Sister   . Prostate cancer Neg Hx   . Bladder Cancer Neg Hx   . Kidney cancer Neg Hx     Social History Social History   Tobacco Use  . Smoking status: Never Smoker  . Smokeless tobacco: Never Used  Vaping Use  . Vaping Use: Never used  Substance Use Topics  . Alcohol use: No    Alcohol/week: 0.0 standard drinks  . Drug use: No    Comment: used to use THC in the late 1980s    Review of Systems  Constitutional:   No fever or chills.  ENT:   No sore throat. No rhinorrhea. Cardiovascular:   No chest pain or syncope. Respiratory:   No dyspnea or cough. Gastrointestinal:   Negative for abdominal pain, vomiting and diarrhea.  Musculoskeletal:   Negative for focal pain or swelling All other systems reviewed and are negative except as documented above in ROS and HPI.  ____________________________________________   PHYSICAL EXAM:  VITAL SIGNS: ED Triage Vitals  Enc Vitals Group     BP 04/20/20 0947 (!) 176/89     Pulse Rate 04/20/20 0947 92     Resp 04/20/20 0947 20     Temp 04/20/20 0947 97.9 F (36.6 C)     Temp Source 04/20/20 0947 Oral     SpO2 04/20/20 0947 100 %     Weight 04/20/20 0948 220 lb (99.8 kg)     Height 04/20/20 0948 5\' 11"  (1.803 m)     Head Circumference --      Peak Flow --      Pain Score 04/20/20 0948 0     Pain Loc --      Pain Edu? --      Excl. in GC? --     Vital signs reviewed, nursing assessments reviewed.   Constitutional:   Alert and oriented. Non-toxic appearance. Eyes:   Conjunctivae are normal. EOMI. PERRL. ENT      Head:   Normocephalic and atraumatic.      Nose:   Wearing a mask.      Mouth/Throat:    Wearing a mask.      Neck:   No meningismus. Full ROM. Hematological/Lymphatic/Immunilogical:   No cervical lymphadenopathy. Cardiovascular:   RRR. Symmetric bilateral radial and DP pulses.  No murmurs. Cap refill less than 2 seconds. Respiratory:   Normal respiratory effort without tachypnea/retractions. Breath sounds are clear and equal bilaterally. No wheezes/rales/rhonchi. Gastrointestinal:   Soft and nontender. Non distended. There is no CVA tenderness.  No rebound, rigidity, or guarding. Musculoskeletal:   Normal range of motion in all extremities. No joint effusions.  No lower extremity tenderness.  No  edema. Neurologic:   Normal speech and language. Cranial nerves III through XII intact Motor grossly intact. No acute focal neurologic deficits are appreciated.  Skin:    Skin is warm, dry and intact. No rash noted.  No petechiae, purpura, or bullae.  ____________________________________________    LABS (pertinent positives/negatives) (all labs ordered are listed, but only abnormal results are displayed) Labs Reviewed  BASIC METABOLIC PANEL - Abnormal; Notable for the following components:      Result Value   Glucose, Bld 162 (*)    All other components within normal limits  CBC  TROPONIN I (HIGH SENSITIVITY)   ____________________________________________   EKG  Interpreted by me Sinus rhythm rate of 82, left axis, first-degree AV block.  Normal QRS ST segments and T waves, no ischemic changes.  ____________________________________________    RADIOLOGY  CT Head Wo Contrast  Result Date: 04/20/2020 CLINICAL DATA:  Dizziness.  Hypertension EXAM: CT HEAD WITHOUT CONTRAST TECHNIQUE: Contiguous axial images were obtained from the base of the skull through the vertex without intravenous contrast. COMPARISON:  March 07, 2014. FINDINGS: Brain: Ventricles and sulci are normal in size and contour for age. There is no intracranial mass, hemorrhage, extra-axial fluid collection,  or midline shift. Prior infarct noted in the inferior left centrum semiovale with involvement of a portion of the anterior limb of the left external capsule and posterior left lentiform nucleus. Elsewhere brain parenchyma appears unremarkable. No acute appearing infarct is evident. Vascular: No hyperdense vessel. Calcification in each carotid siphon region noted. Skull: Bony calvarium appears intact. Sinuses/Orbits: Mucosal thickening noted in several ethmoid air cells. Opacification in posterior left ethmoid air cell. Mild mucosal thickening in the anterior sphenoid sinus regions. Orbits appear symmetric bilaterally. Other: Visualized mastoid air cells are clear. IMPRESSION: Prior infarct involving the inferior left centrum semiovale and adjacent posterior lentiform nucleus in a portion of the posterior limb of the left external capsule. No acute infarct evident. No mass or hemorrhage. Foci of arterial vascular calcification noted. Foci of paranasal sinus disease noted. Electronically Signed   By: Bretta Bang III M.D.   On: 04/20/2020 13:38    ____________________________________________   PROCEDURES Procedures  ____________________________________________  DIFFERENTIAL DIAGNOSIS   Symptomatic hypertension, peripheral vertigo, lacunar infarct, intracranial hemorrhage  CLINICAL IMPRESSION / ASSESSMENT AND PLAN / ED COURSE  Medications ordered in the ED: Medications - No data to display  Pertinent labs & imaging results that were available during my care of the patient were reviewed by me and considered in my medical decision making (see chart for details).  Izyan Ezzell was evaluated in Emergency Department on 04/20/2020 for the symptoms described in the history of present illness. He was evaluated in the context of the global COVID-19 pandemic, which necessitated consideration that the patient might be at risk for infection with the SARS-CoV-2 virus that causes COVID-19. Institutional  protocols and algorithms that pertain to the evaluation of patients at risk for COVID-19 are in a state of rapid change based on information released by regulatory bodies including the CDC and federal and state organizations. These policies and algorithms were followed during the patient's care in the ED.   Patient presents with uncontrolled hypertension and intermittent dizziness over the past few days.  No changes in his medication regimen recently.   Considering the patient's symptoms, medical history, and physical examination today, I have low suspicion for ischemic stroke, intracranial hemorrhage, meningitis, encephalitis, carotid or vertebral dissection, venous sinus thrombosis, MS, intracranial hypertension, glaucoma, CRAO, CRVO, or temporal  arteritis.  Blood pressure currently improved to 170/90, patient is asymptomatic currently.  Will obtain CT head to look for any bleeding complications, otherwise plan to discharge home to follow-up with PCP.  Not currently able to titrate medication because patient reports that blood pressures sometimes as low as 140/70, heart rate is typically about 65, and is currently on maximal losartan and amlodipine.   ----------------------------------------- 1:44 PM on 04/20/2020 -----------------------------------------  CT imaging reviewed, no acute hemorrhage.  Radiology report confirms no acute findings.  Blood pressure continues to stay within reasonable limits, suitable for discharge home.     ____________________________________________   FINAL CLINICAL IMPRESSION(S) / ED DIAGNOSES    Final diagnoses:  Hypertension, unspecified type  Dizziness     ED Discharge Orders    None      Portions of this note were generated with dragon dictation software. Dictation errors may occur despite best attempts at proofreading.   Sharman Cheek, MD 04/20/20 1344

## 2020-04-20 NOTE — ED Triage Notes (Signed)
Pt reports a couple of days ago was feeling swimmy headed and his BP was elevated. Pt states this am he was not that dizzy but his BP was really high. Pt denies pain, SOB or other sx's.

## 2020-04-20 NOTE — Discharge Instructions (Signed)
Your CT scan of the head today was okay.  There were no signs of an acute stroke or bleeding.  Continue taking all of your blood pressure medicines as prescribed including taking the clonidine as needed, and follow-up with your doctor this week for recheck of your blood pressure.

## 2020-04-20 NOTE — ED Notes (Signed)
ED Provider at bedside. 

## 2020-04-21 ENCOUNTER — Encounter: Payer: Self-pay | Admitting: Family Medicine

## 2020-04-23 ENCOUNTER — Ambulatory Visit (INDEPENDENT_AMBULATORY_CARE_PROVIDER_SITE_OTHER): Payer: Medicare Other | Admitting: Internal Medicine

## 2020-04-23 ENCOUNTER — Other Ambulatory Visit: Payer: Self-pay

## 2020-04-23 ENCOUNTER — Encounter: Payer: Self-pay | Admitting: Internal Medicine

## 2020-04-23 VITALS — BP 120/90 | HR 79 | Temp 97.8°F | Resp 16 | Ht 71.0 in | Wt 218.5 lb

## 2020-04-23 DIAGNOSIS — G8110 Spastic hemiplegia affecting unspecified side: Secondary | ICD-10-CM | POA: Diagnosis not present

## 2020-04-23 DIAGNOSIS — Z09 Encounter for follow-up examination after completed treatment for conditions other than malignant neoplasm: Secondary | ICD-10-CM | POA: Diagnosis not present

## 2020-04-23 DIAGNOSIS — I10 Essential (primary) hypertension: Secondary | ICD-10-CM | POA: Diagnosis not present

## 2020-04-23 MED ORDER — HYDROCHLOROTHIAZIDE 12.5 MG PO CAPS: 12.5000 mg | ORAL_CAPSULE | Freq: Every day | ORAL | 0 refills | Status: DC

## 2020-04-23 NOTE — Patient Instructions (Signed)
Please pick up the hydrochlorothiazide medicine from the pharmacy to take once daily presently, and continue taking your other medications for your blood pressure.

## 2020-04-23 NOTE — Progress Notes (Signed)
Patient ID: [name], male    DOB: [birth year], 64 y.o.   MRN: [id number]  PCP: Danelle Berry, PA-C  Chief Complaint  Patient presents with  . Hospitalization Follow-up  . Dizziness    Subjective:   [name] is a 64 y.o. male, presents to clinic with CC of the following:  Chief Complaint  Patient presents with  . Hospitalization Follow-up  . Dizziness    HPI:  Patient is a 64 year old male patient of Danelle Berry Was seen in the emergency room with blood pressure concerns on 04/20/2020. Follows up today after that visit. I did talk with Sheliah Mends about him yesterday, and she noted she would likely be able to see him, although he is put on my schedule to see today.  His history in the ER was as follows:  [name] is a 64 y.o. male with a history of AAA, left MCA stroke with residual right arm weakness, hypertension on losartan, amlodipine, and as needed clonidine who comes ED complaining of intermittent dizziness over the past 2 to 3 days, associated with elevated blood pressure.  No chest pain neck pain shortness of breath headache vision changes paresthesias or weakness.  No change in balance or coordination.  No aggravating or alleviating factors.  No syncope.  Last night he noted his blood pressure was as high as 190/100, took one of his clonidine which improved to 140/80, and then he went to bed.  However when he woke up this morning blood pressure was again elevated at about 190 systolic and he came to the ED for evaluation.   The assessment and plan was as follows:  Patient presents with uncontrolled hypertension and intermittent dizziness over the past few days.  No changes in his medication regimen recently.   Considering the patient's symptoms, medical history, and physical examination today, I have low suspicion for ischemic stroke, intracranial hemorrhage, meningitis, encephalitis, carotid or vertebral dissection, venous sinus thrombosis, MS, intracranial  hypertension, glaucoma, CRAO, CRVO, or temporal arteritis.  Blood pressure currently improved to 170/90, patient is asymptomatic currently.  Will obtain CT head to look for any bleeding complications, otherwise plan to discharge home to follow-up with PCP.  Not currently able to titrate medication because patient reports that blood pressures sometimes as low as 140/70, heart rate is typically about 65, and is currently on maximal losartan and amlodipine. CT imaging reviewed, no acute hemorrhage.  Radiology report confirms no acute findings.  Blood pressure continues to stay within reasonable limits, suitable for discharge home.  His last visit with Danelle Berry was on 02/25/2020 with the following noted:  Hypertension:  Currently managed on Amlodipinie 10 mg qd, Coreg 6.25 mg bid, Losartan 100 mg qd Clonidine 0.1 mg PRN - prescribed years ago Pt reports good med compliance and denies any SE.   Blood pressure today is well controlled.   Higher readings when getting tx/botox/procedures with specialists    BP Readings from Last 5 Encounters:  02/25/20 136/76  01/03/20 (!) 170/90  10/26/19 (!) 142/82  10/02/19 (!) 174/91  07/24/19 122/78  Pt denies CP, SOB, exertional sx, LE edema, palpitation, Ha's, visual disturbances, lightheadedness, hypotension, syncope.  On follow-up today, he notes he has had no new concerning symptoms since the emergency room visit, denies any chest pains, shortness of breath, any new one-sided symptoms of concern.  Still can feel dizzy at times, although not that problematic.  Denies any vision changes, no nausea or vomiting, and has not used any clonidine  product since that emergency room visit.  He remains on his other blood pressure medications as noted above. He noted previously he was on a diuretic, and that seemed to be quite helpful for his blood pressure, although since that was stopped a while back, he does not think his blood pressure has been as well  controlled. He notes he eats healthy, has a recumbent bike at home he rides intermittently, and has limitations in his right side after his stroke, especially in the right upper extremity, although has had no new work ncreased symptoms of concern in that regard in the very recent past.  Patient Active Problem List   Diagnosis Date Noted  . Obesity (BMI 30.0-34.9) 03/10/2018  . Abnormal EKG 12/15/2017  . Hypothyroidism 03/09/2017  . Abdominal aortic atherosclerosis (HCC) 05/20/2016  . Pre-diabetes 02/18/2015  . Atherosclerosis of both carotid arteries 02/18/2015  . 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 Shoulder subluxation, right 04/23/2014  . Spastic hemiplegia affecting dominant side (HCC) 02/18/2014  . Hypertension goal BP (blood pressure) < 140/90 01/17/2014  . Hyperlipidemia LDL goal <70 01/17/2014      Current Outpatient Medications:  .  acetaminophen (TYLENOL) 500 MG tablet, Take 1,000 mg by mouth daily as needed for moderate pain., Disp: , Rfl:  .  amLODipine (NORVASC) 10 MG tablet, TAKE 1 TABLET BY MOUTH  DAILY, Disp: 90 tablet, Rfl: 3 .  aspirin EC 325 MG tablet, TAKE 1 TABLET (325 MG TOTAL) BY MOUTH DAILY., Disp: 90 tablet, Rfl: 3 .  carvedilol (COREG) 6.25 MG tablet, TAKE 1 TABLET BY MOUTH  TWICE DAILY WITH A MEAL, Disp: 180 tablet, Rfl: 3 .  cloNIDine (CATAPRES) 0.1 MG tablet, Take 0.1 mg by mouth as needed. , Disp: , Rfl:  .  losartan (COZAAR) 100 MG tablet, TAKE 1 TABLET BY MOUTH  DAILY, Disp: 90 tablet, Rfl: 3 .  Multiple Vitamins-Minerals (ONE-A-DAY MENS HEALTH FORMULA) TABS, Take 1 tablet by mouth daily., Disp: , Rfl:  .  Naphazoline-Hypromellose (TGT LUBRICANT REDNESS RELIEVER OP), Place 1 drop into both eyes daily as needed (redness/ dryness)., Disp: , Rfl:  .  Omega-3 Fatty Acids (FISH OIL) 1000 MG CAPS, Take 1,000 mg by mouth daily., Disp: , Rfl:  .  rosuvastatin (CRESTOR) 40 MG tablet, TAKE 1 TABLET BY MOUTH AT  BEDTIME FOR CHOLESTEROL, Disp: 90 tablet,  Rfl: 3   No Known Allergies   Past Surgical History:  Procedure Laterality Date  . HYDROCELE EXCISION Left 01/18/2018   Procedure: HYDROCELECTOMY ADULT;  Surgeon: Vanna ScotlandBrandon, Ashley, MD;  Location: [facility] ORS;  Service: Urology;  Laterality: Left;  . INGUINAL HERNIA REPAIR Bilateral 06/25/2016   Procedure: HERNIA REPAIR INGUINAL ADULT BILATERAL;  Surgeon: Kieth BrightlySeeplaputhur G Sankar, MD;  Location: [facility] ORS;  Service: General;  Laterality: Bilateral;  . KNEE ARTHROSCOPY Right 1970's     Family History  Problem Relation Age of Onset  . Hypertension Mother   . Hernia Sister   . Prostate cancer Neg Hx   . Bladder Cancer Neg Hx   . Kidney cancer Neg Hx      Social History   Tobacco Use  . Smoking status: Never Smoker  . Smokeless tobacco: Never Used  Substance Use Topics  . Alcohol use: No    Alcohol/week: 0.0 standard drinks    With staff assistance, above reviewed with the patient today.  ROS: As per HPI, otherwise no specific complaints on a limited and focused system review   No results found for this  or any previous visit (from the past 72 hour(s)).   PHQ2/9: Depression screen [facility] 2/9 04/23/2020 04/08/2020 02/25/2020 10/26/2019 04/06/2019  Decreased Interest 0 0 0 0 0  Down, Depressed, Hopeless 0 0 0 0 0  PHQ - 2 Score 0 0 0 0 0  Altered sleeping - - - 0 -  Tired, decreased energy - - - 0 -  Change in appetite - - - 0 -  Feeling bad or failure about yourself  - - - 0 -  Trouble concentrating - - - 0 -  Moving slowly or fidgety/restless - - - 0 -  Suicidal thoughts - - - 0 -  PHQ-9 Score - - - 0 -  Difficult doing work/chores - - - Not difficult at all -  Some recent data might be hidden   PHQ-2/9 Result is neg  Fall Risk: Fall Risk  04/23/2020 04/08/2020 02/25/2020 10/26/2019 07/24/2019  Falls in the past year? 0 0 0 0 0  Number falls in past yr: 0 0 0 0 0  Injury with Fall? 0 0 0 0 0  Risk for fall due to : - - - - -  Follow up - - Falls evaluation completed - -       Objective:   Vitals:   04/23/20 1038  BP: (!) 179/90  Pulse: 79  Resp: 16  Temp: 97.8 F (36.6 C)  TempSrc: Oral  SpO2: 100%  Weight: 218 lb 8 oz (99.1 kg)  Height: 5\' 11"  (1.803 m)    Body mass index is 30.47 kg/m. Recheck blood pressure by myself was 144/86 on the left with a large adult cuff. Physical Exam   NAD, masked, very pleasant, looks well. HEENT - [place]/AT, sclera anicteric, PERRL, EOMI without nystagmus, conj - non-inj'ed,  pharynx clear Neck - supple, no adenopathy, carotids 2+ and = without bruits bilat Car - RRR without m/g/r Pulm- RR and effort normal at rest, CTA without wheeze or rales Abd - soft, NT diffusely, ND,  Back - no CVA tenderness Ext - no LE edema,  Neuro/psychiatric - affect was not flat, appropriate with conversation  Alert and oriented  Right upper extremity weakness as residual post CVA present, has a brace on his distal right lower extremity he wears to help when up and about,   Speech normal   Results for orders placed or performed during the hospital encounter of 04/20/20  Basic metabolic panel  Result Value Ref Range   Sodium 137 135 - 145 mmol/L   Potassium 3.7 3.5 - 5.1 mmol/L   Chloride 103 98 - 111 mmol/L   CO2 25 22 - 32 mmol/L   Glucose, Bld 162 (H) 70 - 99 mg/dL   BUN 11 8 - 23 mg/dL   Creatinine, Ser 04/22/20 0.61 - 1.24 mg/dL   Calcium 9.3 8.9 - 3.22 mg/dL   GFR, Estimated 02.5 >42 mL/min   Anion gap 9 5 - 15  CBC  Result Value Ref Range   WBC 5.1 4.0 - 10.5 K/uL   RBC 5.06 4.22 - 5.81 MIL/uL   Hemoglobin 14.1 13.0 - 17.0 g/dL   HCT >70 39 - 52 %   MCV 85.2 80.0 - 100.0 fL   MCH 27.9 26.0 - 34.0 pg   MCHC 32.7 30.0 - 36.0 g/dL   RDW 62.3 76.2 - 83.1 %   Platelets 266 150 - 400 K/uL   nRBC 0.0 0.0 - 0.2 %  Troponin I (High Sensitivity)  Result Value  Ref Range   Troponin I (High Sensitivity) 6 <18 ng/L   Recent labs from the ER reviewed Assessment & Plan:   1. Hypertension goal BP (blood pressure) < 140/90 His  blood pressures have been quite labile in the recent past, with the most recent emergency room visit with elevated blood pressures noted at that time.  He did remain improved in the emergency room, and have continued to be better since discharge from the emergency room.  He has not needed any clonidine as needed since leaving the ER. Blood pressure slightly higher on assessment today, and do feel a slight adjustment in his blood pressure medication regimen may be helpful. We will add a small dose of hydrochlorothiazide-12.5 mg daily to his regimen presently. I did note this can be combined with one of his medicines, losartan in the future if he is tolerating this medicine and his blood pressures are better controlled.  Also with room to slightly increase this dose pending his status.  - hydrochlorothiazide (MICROZIDE) 12.5 MG capsule; Take 1 capsule (12.5 mg total) by mouth daily.  Dispense: 30 capsule; Refill: 0  2. Spastic hemiplegia affecting dominant side (HCC)  No concerning change in the recent past, with work-up in the ER recently reviewed as well.   Did feel best having him follow-up again in about 7 to 10 days to reassess, and asked to make an appointment with his PCP Danelle Berry to do so. Can follow-up sooner as needed.     Jamelle Haring, MD 04/23/20 10:40 AM

## 2020-04-25 ENCOUNTER — Encounter: Payer: Self-pay | Admitting: Family Medicine

## 2020-04-30 ENCOUNTER — Other Ambulatory Visit: Payer: Self-pay

## 2020-04-30 ENCOUNTER — Ambulatory Visit (INDEPENDENT_AMBULATORY_CARE_PROVIDER_SITE_OTHER): Payer: Medicare Other | Admitting: Family Medicine

## 2020-04-30 ENCOUNTER — Encounter: Payer: Self-pay | Admitting: Family Medicine

## 2020-04-30 VITALS — BP 136/82 | HR 83 | Temp 98.3°F | Resp 16 | Ht 71.0 in | Wt 215.5 lb

## 2020-04-30 DIAGNOSIS — J329 Chronic sinusitis, unspecified: Secondary | ICD-10-CM

## 2020-04-30 DIAGNOSIS — J310 Chronic rhinitis: Secondary | ICD-10-CM | POA: Diagnosis not present

## 2020-04-30 DIAGNOSIS — I10 Essential (primary) hypertension: Secondary | ICD-10-CM

## 2020-04-30 MED ORDER — LOSARTAN POTASSIUM-HCTZ 100-12.5 MG PO TABS: 1.0000 | ORAL_TABLET | Freq: Every day | ORAL | 3 refills | Status: DC

## 2020-04-30 NOTE — Progress Notes (Signed)
Name: Wanda Cellucci   MRN: [id number]    DOB: 02-22-1956   Date:04/30/2020       Progress Note  Chief Complaint  Patient presents with  . Hypertension    1 week follow up elevated bp, dizziness     Subjective:   Duron Meister is a 64 y.o. male, presents to clinic for HTN f/up  Pt here for blood pressure recheck. Lab OV (routine was about 2 months ago) his blood pressure had been elevated at specialist appointments but normal in office He later went to the ER with dizziness some confusion and elevated blood pressure right after Thanksgiving.   EKG, head CT and blood work was unremarkable.  His medications were adjusted and patient followed up with Dr. Dorris Fetch shortly afterwards. Dr. Dorris Fetch added hydrochlorothiazide to his other medications including amlodipine 10 mg, carvedilol 6.25 bid, and losartan 100 mg  Patient has not had any recurrent dizzy episodes or headaches.  He feels much better with the addition of hydrochlorothiazide.  Blood pressure is well controlled and he is not having as many high readings.  BP Readings from Last 5 Encounters:  04/30/20 136/82  04/23/20 120/90  04/20/20 (!) 156/91  04/08/20 (!) 153/86  02/25/20 136/76   Pt denies chest pain, SOB, dizziness, or heart palpitations.  Denies medication side effects.   CT scan of head and ER EKG and blood work was reviewed today with the patient.  CT found evidence of prior stroke which is known and some sinus disease.  Patient denies any sinus congestion, snoring, sinus pain or tenderness headaches fever sore throat or sneezing or drainage.      Current Outpatient Medications:  .  acetaminophen (TYLENOL) 500 MG tablet, Take 1,000 mg by mouth daily as needed for moderate pain., Disp: , Rfl:  .  amLODipine (NORVASC) 10 MG tablet, TAKE 1 TABLET BY MOUTH  DAILY, Disp: 90 tablet, Rfl: 3 .  aspirin EC 325 MG tablet, TAKE 1 TABLET (325 MG TOTAL) BY MOUTH DAILY., Disp: 90 tablet, Rfl: 3 .  carvedilol  (COREG) 6.25 MG tablet, TAKE 1 TABLET BY MOUTH  TWICE DAILY WITH A MEAL, Disp: 180 tablet, Rfl: 3 .  cloNIDine (CATAPRES) 0.1 MG tablet, Take 0.1 mg by mouth as needed. , Disp: , Rfl:  .  Multiple Vitamins-Minerals (ONE-A-DAY MENS HEALTH FORMULA) TABS, Take 1 tablet by mouth daily., Disp: , Rfl:  .  Naphazoline-Hypromellose (TGT LUBRICANT REDNESS RELIEVER OP), Place 1 drop into both eyes daily as needed (redness/ dryness)., Disp: , Rfl:  .  Omega-3 Fatty Acids (FISH OIL) 1000 MG CAPS, Take 1,000 mg by mouth daily., Disp: , Rfl:  .  losartan-hydrochlorothiazide (HYZAAR) 100-12.5 MG tablet, Take 1 tablet by mouth daily., Disp: 90 tablet, Rfl: 3 .  rosuvastatin (CRESTOR) 40 MG tablet, TAKE 1 TABLET BY MOUTH AT  BEDTIME FOR CHOLESTEROL, Disp: 90 tablet, Rfl: 3  Patient Active Problem List   Diagnosis Date Noted  . Obesity (BMI 30.0-34.9) 03/10/2018  . Abnormal EKG 12/15/2017  . Hypothyroidism 03/09/2017  . Abdominal aortic atherosclerosis (HCC) 05/20/2016  . Pre-diabetes 02/18/2015  . Atherosclerosis of both carotid arteries 02/18/2015  . 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 Shoulder subluxation, right 04/23/2014  . Spastic hemiplegia affecting dominant side (HCC) 02/18/2014  . Hypertension goal BP (blood pressure) < 140/90 01/17/2014  . Hyperlipidemia LDL goal <70 01/17/2014    Past Surgical History:  Procedure Laterality Date  . HYDROCELE EXCISION Left 01/18/2018   Procedure: HYDROCELECTOMY ADULT;  Surgeon: Vanna Scotland, MD;  Location: [facility] ORS;  Service: Urology;  Laterality: Left;  . INGUINAL HERNIA REPAIR Bilateral 06/25/2016   Procedure: HERNIA REPAIR INGUINAL ADULT BILATERAL;  Surgeon: Kieth Brightly, MD;  Location: [facility] ORS;  Service: General;  Laterality: Bilateral;  . KNEE ARTHROSCOPY Right 1970's    Family History  Problem Relation Age of Onset  . Hypertension Mother   . Hernia Sister   . Prostate cancer Neg Hx   . Bladder Cancer Neg Hx   . Kidney  cancer Neg Hx     Social History   Tobacco Use  . Smoking status: Never Smoker  . Smokeless tobacco: Never Used  Vaping Use  . Vaping Use: Never used  Substance Use Topics  . Alcohol use: No    Alcohol/week: 0.0 standard drinks  . Drug use: No    Comment: used to use THC in the late 1980s     No Known Allergies  Health Maintenance  Topic Date Due  . Fecal DNA (Cologuard)  Never done  . PNA vac Low Risk Adult (1 of 2 - PCV13) Never done  . Hepatitis C Screening  10/25/2020 (Originally Sep 10, 1955)  . HIV Screening  10/25/2020 (Originally 06/30/1970)  . TETANUS/TDAP  07/20/2025  . INFLUENZA VACCINE  Completed  . COVID-19 Vaccine  Completed    Chart Review Today: I personally reviewed active problem list, medication list, allergies, family history, social history, health maintenance, notes from last encounter, lab results, imaging with the patient/caregiver today.   Review of Systems  10 Systems reviewed and are negative for acute change except as noted in the HPI.  Objective:   Vitals:   04/30/20 1045  BP: 136/82  Pulse: 83  Resp: 16  Temp: 98.3 F (36.8 C)  TempSrc: Oral  SpO2: 98%  Weight: 215 lb 8 oz (97.8 kg)  Height: 5\' 11"  (1.803 m)    Body mass index is 30.06 kg/m.  Physical Exam Vitals and nursing note reviewed.  Constitutional:      General: He is not in acute distress.    Appearance: Normal appearance. He is not ill-appearing, toxic-appearing or diaphoretic.  HENT:     Head: Normocephalic and atraumatic.     Right Ear: Tympanic membrane, ear canal and external ear normal. There is no impacted cerumen.     Left Ear: Tympanic membrane, ear canal and external ear normal. There is no impacted cerumen.     Nose: Mucosal edema present. No rhinorrhea.     Right Nostril: No occlusion.     Left Nostril: No epistaxis or occlusion.     Right Turbinates: Enlarged and swollen. Not pale.     Left Turbinates: Enlarged and swollen. Not pale.     Right Sinus: No  maxillary sinus tenderness or frontal sinus tenderness.     Left Sinus: No maxillary sinus tenderness or frontal sinus tenderness.     Mouth/Throat:     Mouth: Mucous membranes are moist.     Pharynx: Oropharynx is clear. Uvula midline.  Cardiovascular:     Pulses: Normal pulses.     Heart sounds: Normal heart sounds.  Pulmonary:     Effort: Pulmonary effort is normal.     Breath sounds: Normal breath sounds.  Abdominal:     General: Bowel sounds are normal.     Palpations: Abdomen is soft.  Musculoskeletal:     Right lower leg: No edema.     Left lower leg: No edema.  Neurological:  Mental Status: He is alert. Mental status is at baseline.     Gait: Gait abnormal.  Psychiatric:        Mood and Affect: Mood normal.        Behavior: Behavior normal.         Assessment & Plan:     ICD-10-CM   1. Hypertension goal BP (blood pressure) < 140/90  I10 losartan-hydrochlorothiazide (HYZAAR) 100-12.5 MG tablet  2. Rhinosinusitis  J31.0    J32.9    sinus disease noted on CT scan from ER   Patient's blood pressure is at goal today he reports that over the past week it has been much better with the addition of a 12.5 mg hydrochlorothiazide.  He has not had to use clonidine in the past several days His dizziness and symptoms from over a week ago have completely resolved.  No exertional symptoms No new focal weakness numbness, headaches, vertigo.  Continue management of hypertension with losartan 100 mg, HCTZ 12.5 mg (see if we can get an combo pill as noted above) continue amlodipine 10 mg, carvedilol 6.25 mg twice daily, and encouraged him to sparingly or rarely use clonidine and try to get a follow-up appointment with me if he is having to use.  He did have signs of sinus disease on the CT scan we reviewed that today.  He has evidence of enlarged nasal turbinates and generally edematous and mildly erythematous nasal mucosa but he does not have any sinus tenderness to palpation does  not feel congested, discussed starting flonase and 2nd gen antihistamine if any nasal/sinus sx seem to be related to dizziness, but he declines to start anything now due to feeling well.    Pt has f/up in 4 months  Danelle Berry, PA-C 04/30/20 12:11 PM

## 2020-05-08 ENCOUNTER — Encounter: Payer: Self-pay | Admitting: Family Medicine

## 2020-05-09 ENCOUNTER — Encounter: Payer: Self-pay | Admitting: Family Medicine

## 2020-05-10 ENCOUNTER — Emergency Department
Admission: EM | Admit: 2020-05-10 | Discharge: 2020-05-10 | Disposition: A | Discharge status: Home / Self Care | Payer: Medicare Other | Attending: Emergency Medicine | Admitting: Emergency Medicine

## 2020-05-10 ENCOUNTER — Emergency Department: Payer: Medicare Other

## 2020-05-10 ENCOUNTER — Other Ambulatory Visit: Payer: Self-pay

## 2020-05-10 ENCOUNTER — Encounter: Payer: Self-pay | Admitting: Emergency Medicine

## 2020-05-10 DIAGNOSIS — E039 Hypothyroidism, unspecified: Secondary | ICD-10-CM | POA: Diagnosis not present

## 2020-05-10 DIAGNOSIS — R7303 Prediabetes: Secondary | ICD-10-CM | POA: Insufficient documentation

## 2020-05-10 DIAGNOSIS — I129 Hypertensive chronic kidney disease with stage 1 through stage 4 chronic kidney disease, or unspecified chronic kidney disease: Secondary | ICD-10-CM | POA: Insufficient documentation

## 2020-05-10 DIAGNOSIS — N189 Chronic kidney disease, unspecified: Secondary | ICD-10-CM | POA: Diagnosis not present

## 2020-05-10 DIAGNOSIS — Z79899 Other long term (current) drug therapy: Secondary | ICD-10-CM | POA: Diagnosis not present

## 2020-05-10 DIAGNOSIS — Z8673 Personal history of transient ischemic attack (TIA), and cerebral infarction without residual deficits: Secondary | ICD-10-CM | POA: Diagnosis not present

## 2020-05-10 DIAGNOSIS — S39011A Strain of muscle, fascia and tendon of abdomen, initial encounter: Secondary | ICD-10-CM | POA: Diagnosis not present

## 2020-05-10 DIAGNOSIS — X58XXXA Exposure to other specified factors, initial encounter: Secondary | ICD-10-CM | POA: Diagnosis not present

## 2020-05-10 DIAGNOSIS — R222 Localized swelling, mass and lump, trunk: Secondary | ICD-10-CM | POA: Diagnosis present

## 2020-05-10 NOTE — Discharge Instructions (Addendum)
Your ultrasound did not show a true hernia at the umbilical area.  Advised to continue monitoring the area and follow-up with PCP if you start developing pain, nausea, or vomiting.

## 2020-05-10 NOTE — ED Notes (Signed)
Patient declined discharge vital signs. E-signature pad not functional. Patient was anxious to leave due to his wife not wanting to drive in the dark and it's raining. Patient transported to car in wheelchair.

## 2020-05-10 NOTE — ED Provider Notes (Signed)
[facility] Emergency Department Provider Note   ____________________________________________   Event Date/Time   First MD Initiated Contact with Patient 05/10/20 1519     (approximate)  I have reviewed the triage vital signs and the nursing notes.   HISTORY  Chief Complaint Knot in Side    HPI [name] is a 64 y.o. male patient complained of a "knot" to the left of his umbilicus.  Patient states he noticed in the last 3 days.  Patient states it is palpable in the supine position becomes more pronounced when standing up.  Denies pain associated with complaint.  Denies injury.  Patient was told he had a small umbilical hernia 4 years ago.  Concerned the hernia might have enlarged.  Past Medical History:  Diagnosis Date  . AAA (abdominal aortic aneurysm) (HCC)   . Bowel obstruction (HCC) 2016   during septic event. no further a problem once sepsis resolved  . 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 Chronic kidney disease 2016   happened after stroke. dr. Cherylann Ratel follows him once a year  . Hyperlipidemia   . Hypertension   . Inguinal hernia   . Mild atherosclerosis of carotid artery 12/2017   bilateral stenosis per vascular surgeon  . Pink eye 06/21/2016  . S/P Botox injection    every 3 months into right arm to loosen up muscles  . Sepsis (HCC) 2016   happened after stroke. unsure when,but kidney disease started. followed by dr. Cherylann Ratel  . Stroke [facility]) 12/24/2013   right side weakness    Patient Active Problem List   Diagnosis Date Noted  . Obesity (BMI 30.0-34.9) 03/10/2018  . Abnormal EKG 12/15/2017  . Hypothyroidism 03/09/2017  . Abdominal aortic atherosclerosis (HCC) 05/20/2016  . Pre-diabetes 02/18/2015  . Atherosclerosis of both carotid arteries 02/18/2015  . 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 Shoulder subluxation, right 04/23/2014  . Spastic hemiplegia affecting  dominant side (HCC) 02/18/2014  . Hypertension goal BP (blood pressure) < 140/90 01/17/2014  . Hyperlipidemia LDL goal <70 01/17/2014    Past Surgical History:  Procedure Laterality Date  . HYDROCELE EXCISION Left 01/18/2018   Procedure: HYDROCELECTOMY ADULT;  Surgeon: Vanna Scotland, MD;  Location: [facility] ORS;  Service: Urology;  Laterality: Left;  . INGUINAL HERNIA REPAIR Bilateral 06/25/2016   Procedure: HERNIA REPAIR INGUINAL ADULT BILATERAL;  Surgeon: Kieth Brightly, MD;  Location: [facility] ORS;  Service: General;  Laterality: Bilateral;  . KNEE ARTHROSCOPY Right 1970's    Prior to Admission medications   Medication Sig Start Date End Date Taking? Authorizing Provider  acetaminophen (TYLENOL) 500 MG tablet Take 1,000 mg by mouth daily as needed for moderate pain.    [provider]  amLODipine (NORVASC) 10 MG tablet TAKE 1 TABLET BY MOUTH  DAILY 10/22/19   Danelle Berry, PA-C  aspirin EC 325 MG tablet TAKE 1 TABLET (325 MG TOTAL) BY MOUTH DAILY. 03/06/18   Kerman Passey, MD  carvedilol (COREG) 6.25 MG tablet TAKE 1 TABLET BY MOUTH  TWICE DAILY WITH A MEAL 10/22/19   Danelle Berry, PA-C  cloNIDine (CATAPRES) 0.1 MG tablet Take 0.1 mg by mouth as needed.  08/25/17   [provider]  losartan-hydrochlorothiazide (HYZAAR) 100-12.5 MG tablet Take 1 tablet by mouth daily. 04/30/20   Danelle Berry, PA-C  Multiple Vitamins-Minerals (ONE-A-DAY MENS HEALTH FORMULA) TABS Take 1 tablet by mouth daily.    [provider]  Naphazoline-Hypromellose (  TGT LUBRICANT REDNESS RELIEVER OP) Place 1 drop into both eyes daily as needed (redness/ dryness).    [provider]  Omega-3 Fatty Acids (FISH OIL) 1000 MG CAPS Take 1,000 mg by mouth daily.    [provider]  rosuvastatin (CRESTOR) 40 MG tablet TAKE 1 TABLET BY MOUTH AT  BEDTIME FOR CHOLESTEROL 10/22/19   Danelle Berry, PA-C    Allergies Patient has no known allergies.  Family History  Problem Relation Age of  Onset  . Hypertension Mother   . Hernia Sister   . Prostate cancer Neg Hx   . Bladder Cancer Neg Hx   . Kidney cancer Neg Hx     Social History Social History   Tobacco Use  . Smoking status: Never Smoker  . Smokeless tobacco: Never Used  Vaping Use  . Vaping Use: Never used  Substance Use Topics  . Alcohol use: No    Alcohol/week: 0.0 standard drinks  . Drug use: No    Comment: used to use THC in the late 1980s    Review of Systems Constitutional: No fever/chills Eyes: No visual changes. ENT: No sore throat. Cardiovascular: Denies chest pain. Respiratory: Denies shortness of breath. Gastrointestinal: No abdominal pain.  No nausea, no vomiting.  No diarrhea.  No constipation. Genitourinary: Negative for dysuria. Musculoskeletal: Negative for back pain. Skin: Negative for rash. Neurological: Negative for headaches, focal weakness or numbness.   ____________________________________________   PHYSICAL EXAM:  VITAL SIGNS: ED Triage Vitals  Enc Vitals Group     BP 05/10/20 1316 (!) 144/78     Pulse Rate 05/10/20 1316 76     Resp 05/10/20 1316 16     Temp 05/10/20 1316 98.7 F (37.1 C)     Temp Source 05/10/20 1316 Oral     SpO2 05/10/20 1316 99 %     Weight --      Height --      Head Circumference --      Peak Flow --      Pain Score 05/10/20 1322 0     Pain Loc --      Pain Edu? --      Excl. in GC? --    Constitutional: Alert and oriented. Well appearing and in no acute distress. Cardiovascular: Normal rate, regular rhythm. Grossly normal heart sounds.  Good peripheral circulation. Respiratory: Normal respiratory effort.  No retractions. Lungs CTAB. Gastrointestinal: Soft and nontender. No distention. No abdominal bruits. No CVA tenderness.  No palpable mass. Genitourinary: Deferred Musculoskeletal: No lower extremity tenderness nor edema.  No joint effusions. Neurologic:  Normal speech and language. No gross focal neurologic deficits are appreciated.  No gait instability. Skin:  Skin is warm, dry and intact. No rash noted. Psychiatric: Mood and affect are normal. Speech and behavior are normal.  ____________________________________________   LABS (all labs ordered are listed, but only abnormal results are displayed)  Labs Reviewed - No data to display ____________________________________________  EKG   ____________________________________________  RADIOLOGY I, Joni Reining, personally viewed and evaluated these images (plain radiographs) as part of my medical decision making, as well as reviewing the written report by the radiologist.  ED MD interpretation:    Official radiology report(s): US Abdomen Limited  Result Date: 05/10/2020 CLINICAL DATA:  Palpable mass lateral to umbilicus EXAM: ULTRASOUND ABDOMEN LIMITED COMPARISON:  05/10/2016 FINDINGS: Sonographic evaluation of the palpable area superior and left of the umbilicus was performed. There are no focal sonographic abnormalities in this region. Normal subcutaneous  fat. No evidence of ventral hernia. IMPRESSION: 1. No sonographic abnormality at the site of palpable mass. Electronically Signed   By: Sharlet Salina M.D.   On: 05/10/2020 16:19    ____________________________________________   PROCEDURES  Procedure(s) performed (including Critical Care):  Procedures   ____________________________________________   INITIAL IMPRESSION / ASSESSMENT AND PLAN / ED COURSE  As part of my medical decision making, I reviewed the following data within the electronic MEDICAL RECORD NUMBER         Patient presents with complaint of umbilical mass.  Patient had a history of a small umbilical hernia for years ago.  Discussed ultrasound findings today which was negative for abdominal mass.  Patient given discharge care instruction advised follow-up PCP.  Return to ED if condition worsens.      ____________________________________________   FINAL CLINICAL IMPRESSION(S) / ED  DIAGNOSES  Final diagnoses:  Abdominal muscle strain, initial encounter     ED Discharge Orders    None      *Please note:  [name] was evaluated in Emergency Department on 05/10/2020 for the symptoms described in the history of present illness. He was evaluated in the context of the global COVID-19 pandemic, which necessitated consideration that the patient might be at risk for infection with the SARS-CoV-2 virus that causes COVID-19. Institutional protocols and algorithms that pertain to the evaluation of patients at risk for COVID-19 are in a state of rapid change based on information released by regulatory bodies including the CDC and federal and state organizations. These policies and algorithms were followed during the patient's care in the ED.  Some ED evaluations and interventions may be delayed as a result of limited staffing during and the pandemic.*   Note:  This document was prepared using Dragon voice recognition software and may include unintentional dictation errors.    Joni Reining, PA-C 05/10/20 1642    Minna Antis, MD 05/10/20 2145

## 2020-05-10 NOTE — ED Triage Notes (Signed)
Pt arrived to ED via POV with c/o knot in L side by ribs. Pt denies any pain, just states soreness.   Pt denies recent injury/fall, SOB, or dizziness at this time.

## 2020-07-01 ENCOUNTER — Ambulatory Visit (INDEPENDENT_AMBULATORY_CARE_PROVIDER_SITE_OTHER): Payer: Medicare Other

## 2020-07-01 DIAGNOSIS — Z0000 Encounter for general adult medical examination without abnormal findings: Secondary | ICD-10-CM | POA: Diagnosis not present

## 2020-07-01 NOTE — Progress Notes (Signed)
Subjective:   [name] is a 65 y.o. male who presents for Medicare Annual/Subsequent preventive examination.  Virtual Visit via Telephone Note  I connected with  [name] on 07/01/20 at  2:50 PM EST by telephone and verified that I am speaking with the correct person using two identifiers.  Location: Patient: home Provider: [place] Persons participating in the virtual visit: [place]   I discussed the limitations, risks, security and privacy concerns of performing an evaluation and management service by telephone and the availability of in person appointments. The patient expressed understanding and agreed to proceed.  Interactive audio and video telecommunications were attempted between this nurse and patient, however failed, due to patient having technical difficulties OR patient did not have access to video capability.  We continued and completed visit with audio only.  Some vital signs may be absent or patient reported.   Clemetine Marker, LPN    Review of Systems     Cardiac Risk Factors include: advanced age (>76men, >77 women);dyslipidemia;male gender;hypertension     Objective:    There were no vitals filed for this visit. There is no height or weight on file to calculate BMI.  Advanced Directives 07/01/2020 05/10/2020 04/06/2019 01/18/2018 06/30/2017 03/09/2017 12/08/2016  Does Patient Have a Medical Advance Directive? No No Yes No No No No  Type of Advance Directive - Public librarian;Living will - - - -  Does patient want to make changes to medical advance directive? - - - - - - -  Copy of [place] in Chart? - - No - copy requested - - - -  Would patient like information on creating a medical advance directive? Yes (MAU/Ambulatory/Procedural Areas - Information given) No - Patient declined - No - Patient declined No - Patient declined - No - Patient declined  Pre-existing out of facility DNR order (yellow  form or pink MOST form) - - - - - - -    Current Medications (verified) Outpatient Encounter Medications as of 07/01/2020  Medication Sig  . acetaminophen (TYLENOL) 500 MG tablet Take 1,000 mg by mouth daily as needed for moderate pain.  Marland Kitchen amLODipine (NORVASC) 10 MG tablet TAKE 1 TABLET BY MOUTH  DAILY  . aspirin EC 325 MG tablet TAKE 1 TABLET (325 MG TOTAL) BY MOUTH DAILY.  . carvedilol (COREG) 6.25 MG tablet TAKE 1 TABLET BY MOUTH  TWICE DAILY WITH A MEAL  . cloNIDine (CATAPRES) 0.1 MG tablet Take 0.1 mg by mouth as needed.   Marland Kitchen losartan-hydrochlorothiazide (HYZAAR) 100-12.5 MG tablet Take 1 tablet by mouth daily.  . Multiple Vitamins-Minerals (ONE-A-DAY MENS HEALTH FORMULA) TABS Take 1 tablet by mouth daily.  . Naphazoline-Hypromellose (TGT LUBRICANT REDNESS RELIEVER OP) Place 1 drop into both eyes daily as needed (redness/ dryness).  . Omega-3 Fatty Acids (FISH OIL) 1000 MG CAPS Take 1,000 mg by mouth daily.  . rosuvastatin (CRESTOR) 40 MG tablet TAKE 1 TABLET BY MOUTH AT  BEDTIME FOR CHOLESTEROL   No facility-administered encounter medications on file as of 07/01/2020.    Allergies (verified) Patient has no known allergies.   History: Past Medical History:  Diagnosis Date  . AAA (abdominal aortic aneurysm) ([place])   . Bowel obstruction ([place]) 2016   during septic event. no further a problem once sepsis resolved  . 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 Chronic kidney disease 2016   happened after stroke. dr. Holley Raring follows  him once a year  . Hyperlipidemia   . Hypertension   . Hypothyroidism   . Inguinal hernia   . Mild atherosclerosis of carotid artery 12/2017   bilateral stenosis per vascular surgeon  . Pink eye 06/21/2016  . S/P Botox injection    every 3 months into right arm to loosen up muscles  . Sepsis ([place]) 2016   happened after stroke. unsure when,but kidney disease started. followed by dr. Holley Raring  . Stroke [facility])  12/24/2013   right side weakness   Past Surgical History:  Procedure Laterality Date  . HERNIA REPAIR  12/23/2015  . HYDROCELE EXCISION Left 01/18/2018   Procedure: HYDROCELECTOMY ADULT;  Surgeon: Hollice Espy, MD;  Location: [facility] ORS;  Service: Urology;  Laterality: Left;  . INGUINAL HERNIA REPAIR Bilateral 06/25/2016   Procedure: HERNIA REPAIR INGUINAL ADULT BILATERAL;  Surgeon: Christene Lye, MD;  Location: [facility] ORS;  Service: General;  Laterality: Bilateral;  . KNEE ARTHROSCOPY Right 1970's   Family History  Problem Relation Age of Onset  . Hypertension Mother   . Hernia Sister   . Prostate cancer Neg Hx   . Bladder Cancer Neg Hx   . Kidney cancer Neg Hx    Social History   Socioeconomic History  . Marital status: Married    Spouse name: rosamund  . Number of children: 0  . Years of education: Not on file  . Highest education level: Not on file  Occupational History  . Not on file  Tobacco Use  . Smoking status: Never Smoker  . Smokeless tobacco: Never Used  Vaping Use  . Vaping Use: Never used  Substance and Sexual Activity  . Alcohol use: No    Alcohol/week: 0.0 standard drinks  . Drug use: No    Comment: used to use THC in the late 1980s  . Sexual activity: Not Currently  Other Topics Concern  . Not on file  Social History Narrative  . Not on file   Social Determinants of Health   Financial Resource Strain: Low Risk   . Difficulty of Paying Living Expenses: Not very hard  Food Insecurity: No Food Insecurity  . Worried About Charity fundraiser in the Last Year: Never true  . Ran Out of Food in the Last Year: Never true  Transportation Needs: No Transportation Needs  . Lack of Transportation (Medical): No  . Lack of Transportation (Non-Medical): No  Physical Activity: Insufficiently Active  . Days of Exercise per Week: 3 days  . Minutes of Exercise per Session: 30 min  Stress: No Stress Concern Present  . Feeling of Stress : Not at all   Social Connections: Moderately Integrated  . Frequency of Communication with Friends and Family: More than three times a week  . Frequency of Social Gatherings with Friends and Family: Never  . Attends Religious Services: More than 4 times per year  . Active Member of Clubs or Organizations: No  . Attends Archivist Meetings: Never  . Marital Status: Married    Tobacco Counseling Counseling given: Not Answered   Clinical Intake:  Pre-visit preparation completed: Yes  Pain : No/denies pain     Nutritional Risks: None Diabetes: No  How often do you need to have someone help you when you read instructions, pamphlets, or other written materials from your doctor or pharmacy?: 1 - Never    Interpreter Needed?: No  Information entered by :: Clemetine Marker LPN   Activities of Daily Living In your  present state of health, do you have any difficulty performing the following activities: 07/01/2020 04/30/2020  Hearing? N N  Comment declines hearing aids -  Vision? N N  Difficulty concentrating or making decisions? N N  Walking or climbing stairs? N Y  Dressing or bathing? N N  Doing errands, shopping? N N  Preparing Food and eating ? N -  Using the Toilet? N -  In the past six months, have you accidently leaked urine? N -  Do you have problems with loss of bowel control? N -  Managing your Medications? N -  Managing your Finances? N -  Housekeeping or managing your Housekeeping? N -  Some recent data might be hidden    Patient Care Team: Delsa Grana, PA-C as PCP - General (Family Medicine) Lorretta Harp, MD as Consulting Physician (Cardiology) Hollice Espy, MD as Consulting Physician (Urology) Letta Pate Luanna Salk, MD as Consulting Physician (Physical Medicine and Rehabilitation)  Indicate any recent Medical Services you may have received from other than [facility] providers in the past year (date may be approximate).     Assessment:   This is a routine  wellness examination for [place].  Hearing/Vision screen  Hearing Screening   '125Hz'$  $Remo'250Hz'MGhRt$'500Hz'$'1000Hz'$'2000Hz'$'3000Hz'$'4000Hz'$'6000Hz'$'8000Hz'$   Right ear:           Left ear:           Comments: Pt denies hearing difficulty  Vision Screening Comments: [facility]  Dietary issues and exercise activities discussed: Current Exercise Habits: Home exercise routine, Type of exercise: Other - see comments (stationary bike), Time (Minutes): 30, Frequency (Times/Week): 3, Weekly Exercise (Minutes/Week): 90, Intensity: Moderate, Exercise limited by: None identified  Goals    . Weight (lb) < 210 lb (95.3 kg)     Pt would like to lose a little more weight to help make sure prediabetes stays under control.       Depression Screen PHQ 2/9 Scores 07/01/2020 04/30/2020 04/23/2020 04/08/2020 02/25/2020 10/26/2019 04/06/2019  PHQ - 2 Score 0 0 0 0 0 0 0  PHQ- 9 Score - - - - - 0 -    Fall Risk Fall Risk  07/01/2020 04/30/2020 04/23/2020 04/08/2020 02/25/2020  Falls in the past year? 0 0 0 0 0  Number falls in past yr: 0 0 0 0 0  Injury with Fall? 0 0 0 0 0  Risk for fall due to : No Fall Risks - - - -  Follow up Falls prevention discussed Falls evaluation completed - - Falls evaluation completed    [place]:  Any stairs in or around the home? Yes  If so, are there any without handrails? No  Home free of loose throw rugs in walkways, pet beds, electrical cords, etc? Yes  Adequate lighting in your home to reduce risk of falls? Yes   ASSISTIVE DEVICES UTILIZED TO PREVENT FALLS:  Life alert? No  Use of a cane, walker or w/c? No  Grab bars in the bathroom? Yes  Shower chair or bench in shower? Yes  Elevated toilet seat or a handicapped toilet? No   TIMED UP AND GO:  Was the test performed? No . Telephonic visit.   Cognitive Function: Normal cognitive status assessed by direct observation by this Nurse Health Advisor. No abnormalities  found.        6CIT Screen 04/06/2019  What Year? 0 points  What month? 0  points  What time? 0 points  Count back from 20 0 points  Months in reverse 0 points  Repeat phrase 0 points  Total Score 0    Immunizations Immunization History  Administered Date(s) Administered  . Fluad Quad(high Dose 65+) 02/25/2020  . Influenza,inj,Quad PF,6+ Mos 02/18/2015, 03/09/2017, 03/10/2018, 03/14/2019  . PFIZER(Purple Top)SARS-COV-2 Vaccination 08/23/2019, 09/17/2019, 04/05/2020  . Tdap 07/21/2015    TDAP status: Up to date  Flu Vaccine status: Up to date  Pneumococcal vaccine status: Declined,  Education has been provided regarding the importance of this vaccine but patient still declined. Advised may receive this vaccine at local pharmacy or Health Dept. Aware to provide a copy of the vaccination record if obtained from local pharmacy or Health Dept. Verbalized acceptance and understanding.   Covid-19 vaccine status: Completed vaccines  Qualifies for Shingles Vaccine? Yes   Zostavax completed No   Shingrix Completed?: No.    Education has been provided regarding the importance of this vaccine. Patient has been advised to call insurance company to determine out of pocket expense if they have not yet received this vaccine. Advised may also receive vaccine at local pharmacy or Health Dept. Verbalized acceptance and understanding.  Screening Tests Health Maintenance  Topic Date Due  . Fecal DNA (Cologuard)  Never done  . Hepatitis C Screening  10/25/2020 (Originally 1955/09/03)  . PNA vac Low Risk Adult (1 of 2 - PCV13) 07/01/2021 (Originally 07/01/2019)  . TETANUS/TDAP  07/20/2025  . INFLUENZA VACCINE  Completed  . COVID-19 Vaccine  Completed    Health Maintenance  Health Maintenance Due  Topic Date Due  . Fecal DNA (Cologuard)  Never done   Colorectal Cancer Screening: Due - Cologuard ordered 02/25/20. Pt has kit at home to complete.   Lung Cancer Screening: (Low Dose CT Chest  recommended if Age 67-80 years, 30 pack-year currently smoking OR have quit w/in 15years.) does not qualify.   Additional Screening:  Hepatitis C Screening: does qualify; postponed  Vision Screening: Recommended annual ophthalmology exams for early detection of glaucoma and other disorders of the eye. Is the patient up to date with their annual eye exam?  Yes  Who is the provider or what is the name of the office in which the patient attends annual eye exams? [facility].    Dental Screening: Recommended annual dental exams for proper oral hygiene  Community Resource Referral / Chronic Care Management: CRR required this visit?  No   CCM required this visit?  No      Plan:     I have personally reviewed and noted the following in the patient's chart:   . Medical and social history . Use of alcohol, tobacco or illicit drugs  . Current medications and supplements . Functional ability and status . Nutritional status . Physical activity . Advanced directives . List of other physicians . Hospitalizations, surgeries, and ER visits in previous 12 months . Vitals . Screenings to include cognitive, depression, and falls . Referrals and appointments  In addition, I have reviewed and discussed with patient certain preventive protocols, quality metrics, and best practice recommendations. A written personalized care plan for preventive services as well as general preventive health recommendations were provided to patient.     Clemetine Marker, LPN   08/30/7024   Nurse Notes: none

## 2020-07-01 NOTE — Patient Instructions (Signed)
[name] , Thank you for taking time to come for your Medicare Wellness Visit. I appreciate your ongoing commitment to your health goals. Please review the following plan we discussed and let me know if I can assist you in the future.   Screening recommendations/referrals: Colonoscopy: please complete cologuard kit at home.  Recommended yearly ophthalmology/optometry visit for glaucoma screening and checkup Recommended yearly dental visit for hygiene and checkup  Vaccinations: Influenza vaccine: done 02/25/20 Pneumococcal vaccine: due Tdap vaccine: done 07/21/15 Shingles vaccine: Shingrix discussed. Please contact your pharmacy for coverage information.  Covid-19: done 08/23/19, 09/17/19 & 04/05/20  Advanced directives: Advance directive discussed with you today. I have provided a copy for you to complete at home and have notarized. Once this is complete please bring a copy in to our office so we can scan it into your chart.  Conditions/risks identified: Keep up the great work!  Next appointment: Follow up in one year for your annual wellness visit.   Preventive Care 66 Years and Older, Male Preventive care refers to lifestyle choices and visits with your health care provider that can promote health and wellness. What does preventive care include?  A yearly physical exam. This is also called an annual well check.  Dental exams once or twice a year.  Routine eye exams. Ask your health care provider how often you should have your eyes checked.  Personal lifestyle choices, including:  Daily care of your teeth and gums.  Regular physical activity.  Eating a healthy diet.  Avoiding tobacco and drug use.  Limiting alcohol use.  Practicing safe sex.  Taking low doses of aspirin every day.  Taking vitamin and mineral supplements as recommended by your health care provider. What happens during an annual well check? The services and screenings done by your health care provider  during your annual well check will depend on your age, overall health, lifestyle risk factors, and family history of disease. Counseling  Your health care provider may ask you questions about your:  Alcohol use.  Tobacco use.  Drug use.  Emotional well-being.  Home and relationship well-being.  Sexual activity.  Eating habits.  History of falls.  Memory and ability to understand (cognition).  Work and work Statistician. Screening  You may have the following tests or measurements:  Height, weight, and BMI.  Blood pressure.  Lipid and cholesterol levels. These may be checked every 5 years, or more frequently if you are over 14 years old.  Skin check.  Lung cancer screening. You may have this screening every year starting at age 57 if you have a 30-pack-year history of smoking and currently smoke or have quit within the past 15 years.  Fecal occult blood test (FOBT) of the stool. You may have this test every year starting at age 81.  Flexible sigmoidoscopy or colonoscopy. You may have a sigmoidoscopy every 5 years or a colonoscopy every 10 years starting at age 79.  Prostate cancer screening. Recommendations will vary depending on your family history and other risks.  Hepatitis C blood test.  Hepatitis B blood test.  Sexually transmitted disease (STD) testing.  Diabetes screening. This is done by checking your blood sugar (glucose) after you have not eaten for a while (fasting). You may have this done every 1-3 years.  Abdominal aortic aneurysm (AAA) screening. You may need this if you are a current or former smoker.  Osteoporosis. You may be screened starting at age 15 if you are at high risk. Talk with  your health care provider about your test results, treatment options, and if necessary, the need for more tests. Vaccines  Your health care provider may recommend certain vaccines, such as:  Influenza vaccine. This is recommended every year.  Tetanus,  diphtheria, and acellular pertussis (Tdap, Td) vaccine. You may need a Td booster every 10 years.  Zoster vaccine. You may need this after age 53.  Pneumococcal 13-valent conjugate (PCV13) vaccine. One dose is recommended after age 67.  Pneumococcal polysaccharide (PPSV23) vaccine. One dose is recommended after age 32. Talk to your health care provider about which screenings and vaccines you need and how often you need them. This information is not intended to replace advice given to you by your health care provider. Make sure you discuss any questions you have with your health care provider. Document Released: 06/06/2015 Document Revised: 01/28/2016 Document Reviewed: 03/11/2015 Elsevier Interactive Patient Education  2017 [place] Prevention in the Home Falls can cause injuries. They can happen to people of all ages. There are many things you can do to make your home safe and to help prevent falls. What can I do on the outside of my home?  Regularly fix the edges of walkways and driveways and fix any cracks.  Remove anything that might make you trip as you walk through a door, such as a raised step or threshold.  Trim any bushes or trees on the path to your home.  Use bright outdoor lighting.  Clear any walking paths of anything that might make someone trip, such as rocks or tools.  Regularly check to see if handrails are loose or broken. Make sure that both sides of any steps have handrails.  Any raised decks and porches should have guardrails on the edges.  Have any leaves, snow, or ice cleared regularly.  Use sand or salt on walking paths during winter.  Clean up any spills in your garage right away. This includes oil or grease spills. What can I do in the bathroom?  Use night lights.  Install grab bars by the toilet and in the tub and shower. Do not use towel bars as grab bars.  Use non-skid mats or decals in the tub or shower.  If you need to sit down in  the shower, use a plastic, non-slip stool.  Keep the floor dry. Clean up any water that spills on the floor as soon as it happens.  Remove soap buildup in the tub or shower regularly.  Attach bath mats securely with double-sided non-slip rug tape.  Do not have throw rugs and other things on the floor that can make you trip. What can I do in the bedroom?  Use night lights.  Make sure that you have a light by your bed that is easy to reach.  Do not use any sheets or blankets that are too big for your bed. They should not hang down onto the floor.  Have a firm chair that has side arms. You can use this for support while you get dressed.  Do not have throw rugs and other things on the floor that can make you trip. What can I do in the kitchen?  Clean up any spills right away.  Avoid walking on wet floors.  Keep items that you use a lot in easy-to-reach places.  If you need to reach something above you, use a strong step stool that has a grab bar.  Keep electrical cords out of the way.  Do not  use floor polish or wax that makes floors slippery. If you must use wax, use non-skid floor wax.  Do not have throw rugs and other things on the floor that can make you trip. What can I do with my stairs?  Do not leave any items on the stairs.  Make sure that there are handrails on both sides of the stairs and use them. Fix handrails that are broken or loose. Make sure that handrails are as long as the stairways.  Check any carpeting to make sure that it is firmly attached to the stairs. Fix any carpet that is loose or worn.  Avoid having throw rugs at the top or bottom of the stairs. If you do have throw rugs, attach them to the floor with carpet tape.  Make sure that you have a light switch at the top of the stairs and the bottom of the stairs. If you do not have them, ask someone to add them for you. What else can I do to help prevent falls?  Wear shoes that:  Do not have high  heels.  Have rubber bottoms.  Are comfortable and fit you well.  Are closed at the toe. Do not wear sandals.  If you use a stepladder:  Make sure that it is fully opened. Do not climb a closed stepladder.  Make sure that both sides of the stepladder are locked into place.  Ask someone to hold it for you, if possible.  Clearly mark and make sure that you can see:  Any grab bars or handrails.  First and last steps.  Where the edge of each step is.  Use tools that help you move around (mobility aids) if they are needed. These include:  Canes.  Walkers.  Scooters.  Crutches.  Turn on the lights when you go into a dark area. Replace any light bulbs as soon as they burn out.  Set up your furniture so you have a clear path. Avoid moving your furniture around.  If any of your floors are uneven, fix them.  If there are any pets around you, be aware of where they are.  Review your medicines with your doctor. Some medicines can make you feel dizzy. This can increase your chance of falling. Ask your doctor what other things that you can do to help prevent falls. This information is not intended to replace advice given to you by your health care provider. Make sure you discuss any questions you have with your health care provider. Document Released: 03/06/2009 Document Revised: 10/16/2015 Document Reviewed: 06/14/2014 Elsevier Interactive Patient Education  2017 [employer].

## 2020-07-10 ENCOUNTER — Encounter: Payer: Medicare Other | Admitting: Physical Medicine & Rehabilitation

## 2020-07-29 ENCOUNTER — Other Ambulatory Visit: Payer: Self-pay

## 2020-07-29 ENCOUNTER — Encounter: Payer: Medicare Other | Admitting: Physical Medicine & Rehabilitation

## 2020-07-29 ENCOUNTER — Encounter: Payer: Self-pay | Admitting: Physical Medicine & Rehabilitation

## 2020-07-29 ENCOUNTER — Encounter: Payer: Medicare Other | Attending: Physical Medicine & Rehabilitation | Admitting: Physical Medicine & Rehabilitation

## 2020-07-29 VITALS — BP 153/79 | HR 67 | Temp 98.2°F | Ht 71.0 in | Wt 218.0 lb

## 2020-07-29 DIAGNOSIS — G8110 Spastic hemiplegia affecting unspecified side: Secondary | ICD-10-CM

## 2020-07-29 NOTE — Progress Notes (Signed)
Botox Injection for spasticity using needle EMG guidance  Dilution: 50 Units/ml Indication: Severe spasticity which interferes with ADL,mobility and/or  hygiene and is unresponsive to medication management and other conservative care Informed consent was obtained after describing risks and benefits of the procedure with the patient. This includes bleeding, bruising, infection, excessive weakness, or medication side effects. A REMS form is on file and signed. Last botulinum toxin injection was performed 04/08/2020 Needle: 27g 1" needle electrode Number of units per muscle Biceps100 3+ Brachialis 100 3+  Brachioradialis 50U  3+ FCR50 2+  FDS50 2+ FDP50 2+ PT50 2+  All injections were done after obtaining appropriate EMG activity and after negative drawback for blood. The patient tolerated the procedure well. Post procedure instructions were given. A followup appointment was made.

## 2020-07-29 NOTE — Patient Instructions (Signed)

## 2020-08-25 ENCOUNTER — Encounter: Payer: Self-pay | Admitting: Family Medicine

## 2020-08-25 ENCOUNTER — Other Ambulatory Visit: Payer: Self-pay

## 2020-08-25 ENCOUNTER — Ambulatory Visit (INDEPENDENT_AMBULATORY_CARE_PROVIDER_SITE_OTHER): Payer: Medicare Other | Admitting: Family Medicine

## 2020-08-25 VITALS — BP 134/74 | HR 84 | Temp 98.2°F | Resp 16 | Ht 71.0 in | Wt 220.1 lb

## 2020-08-25 DIAGNOSIS — I10 Essential (primary) hypertension: Secondary | ICD-10-CM | POA: Diagnosis not present

## 2020-08-25 DIAGNOSIS — I700 Atherosclerosis of aorta: Secondary | ICD-10-CM

## 2020-08-25 DIAGNOSIS — E785 Hyperlipidemia, unspecified: Secondary | ICD-10-CM

## 2020-08-25 DIAGNOSIS — Z5181 Encounter for therapeutic drug level monitoring: Secondary | ICD-10-CM

## 2020-08-25 DIAGNOSIS — Z1159 Encounter for screening for other viral diseases: Secondary | ICD-10-CM | POA: Diagnosis not present

## 2020-08-25 MED ORDER — ROSUVASTATIN CALCIUM 40 MG PO TABS: ORAL_TABLET | ORAL | 3 refills | Status: DC

## 2020-08-25 MED ORDER — AMLODIPINE BESYLATE 10 MG PO TABS: 1.0000 | ORAL_TABLET | Freq: Every day | ORAL | 3 refills | Status: DC

## 2020-08-25 MED ORDER — CARVEDILOL 6.25 MG PO TABS: 6.2500 mg | ORAL_TABLET | Freq: Two times a day (BID) | ORAL | 3 refills | Status: DC

## 2020-08-25 NOTE — Progress Notes (Signed)
Name: [name]   MRN: [id number]    DOB: 01-Dec-1955   Date:08/25/2020       Progress Note  Chief Complaint  Patient presents with  . Follow-up  . Hypertension  . Hyperlipidemia     Subjective:   [name] is a 65 y.o. male, presents to clinic for routine f/up  Hypertension:  Currently managed on losartan hctz, carvedilol and amlodipine and clonidine (PRN) - BP much better controlled since adding HCTZ some increased urination but much better controlled BP and no dizzy episodes, hasn't had to use clonidine Pt reports good med compliance and denies any SE.   Blood pressure today is well controlled. BP Readings from Last 3 Encounters:  08/25/20 134/74  07/29/20 (!) 153/79  05/10/20 (!) 144/78   Pt denies CP, SOB, exertional sx, LE edema, palpitation, Ha's, visual disturbances, lightheadedness, hypotension, syncope.   Hyperlipidemia: Currently treated with crestor 40 mg, pt reports good med compliance Last Lipids: Lab Results  Component Value Date   CHOL 155 10/26/2019   HDL 52 10/26/2019   LDLCALC 89 10/26/2019   TRIG 46 10/26/2019   CHOLHDL 3.0 10/26/2019   - Denies: Chest pain, shortness of breath, myalgias, claudication  Hx of CVA and carotid atherosclerosis - sees cardiology and consulted with vascular on crestor and ASA  Completed cologuard - we haven't received results yet  He declined pneumoccocal vaccine today: Health Maintenance  Topic Date Due  . Fecal DNA (Cologuard)  Never done  . Hepatitis C Screening  10/25/2020 (Originally 1955/11/21)  . PNA vac Low Risk Adult (1 of 2 - PCV13) 07/01/2021 (Originally 07/01/2019)  . INFLUENZA VACCINE  12/22/2020  . TETANUS/TDAP  07/20/2025  . COVID-19 Vaccine  Completed  . HPV VACCINES  Aged Out      Current Outpatient Medications:  .  acetaminophen (TYLENOL) 500 MG tablet, Take 1,000 mg by mouth daily as needed for moderate pain., Disp: , Rfl:  .  amLODipine (NORVASC) 10 MG tablet, TAKE 1 TABLET BY  MOUTH  DAILY, Disp: 90 tablet, Rfl: 3 .  aspirin EC 325 MG tablet, TAKE 1 TABLET (325 MG TOTAL) BY MOUTH DAILY., Disp: 90 tablet, Rfl: 3 .  carvedilol (COREG) 6.25 MG tablet, TAKE 1 TABLET BY MOUTH  TWICE DAILY WITH A MEAL, Disp: 180 tablet, Rfl: 3 .  cloNIDine (CATAPRES) 0.1 MG tablet, Take 0.1 mg by mouth as needed. , Disp: , Rfl:  .  losartan-hydrochlorothiazide (HYZAAR) 100-12.5 MG tablet, Take 1 tablet by mouth daily., Disp: 90 tablet, Rfl: 3 .  Multiple Vitamins-Minerals (ONE-A-DAY MENS HEALTH FORMULA) TABS, Take 1 tablet by mouth daily., Disp: , Rfl:  .  Naphazoline-Hypromellose (TGT LUBRICANT REDNESS RELIEVER OP), Place 1 drop into both eyes daily as needed (redness/ dryness)., Disp: , Rfl:  .  Omega-3 Fatty Acids (FISH OIL) 1000 MG CAPS, Take 1,000 mg by mouth daily., Disp: , Rfl:  .  rosuvastatin (CRESTOR) 40 MG tablet, TAKE 1 TABLET BY MOUTH AT  BEDTIME FOR CHOLESTEROL, Disp: 90 tablet, Rfl: 3  Patient Active Problem List   Diagnosis Date Noted  . Obesity (BMI 30.0-34.9) 03/10/2018  . Abnormal EKG 12/15/2017  . Hypothyroidism 03/09/2017  . Abdominal aortic atherosclerosis (HCC) 05/20/2016  . Pre-diabetes 02/18/2015  . Atherosclerosis of both carotid arteries 02/18/2015  . 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 Shoulder subluxation, right 04/23/2014  . Spastic hemiplegia affecting dominant side (HCC) 02/18/2014  . Hypertension goal BP (blood pressure) < 140/90 01/17/2014  .  Hyperlipidemia LDL goal <70 01/17/2014    Past Surgical History:  Procedure Laterality Date  . HERNIA REPAIR  12/23/2015  . HYDROCELE EXCISION Left 01/18/2018   Procedure: HYDROCELECTOMY ADULT;  Surgeon: Vanna Scotland, MD;  Location: [facility];  Service: Urology;  Laterality: Left;  . INGUINAL HERNIA REPAIR Bilateral 06/25/2016   Procedure: HERNIA REPAIR INGUINAL ADULT BILATERAL;  Surgeon: Kieth Brightly, MD;  Location: [facility];  Service: General;  Laterality: Bilateral;  . KNEE  ARTHROSCOPY Right 1970's    Family History  Problem Relation Age of Onset  . Hypertension Mother   . Hernia Sister   . Prostate cancer Neg Hx   . Bladder Cancer Neg Hx   . Kidney cancer Neg Hx     Social History   Tobacco Use  . Smoking status: Never Smoker  . Smokeless tobacco: Never Used  Vaping Use  . Vaping Use: Never used  Substance Use Topics  . Alcohol use: No    Alcohol/week: 0.0 standard drinks  . Drug use: No    Comment: used to use THC in the late 1980s     No Known Allergies  Health Maintenance  Topic Date Due  . Fecal DNA (Cologuard)  Never done  . Hepatitis C Screening  10/25/2020 (Originally Nov 06, 1955)  . PNA vac Low Risk Adult (1 of 2 - PCV13) 07/01/2021 (Originally 07/01/2019)  . INFLUENZA VACCINE  12/22/2020  . TETANUS/TDAP  07/20/2025  . COVID-19 Vaccine  Completed  . HPV VACCINES  Aged Out    Chart Review Today: I personally reviewed active problem list, medication list, allergies, family history, social history, health maintenance, notes from last encounter, lab results, imaging with the patient/caregiver today.   Review of Systems  10 Systems reviewed and are negative for acute change except as noted in the HPI.  Objective:   Vitals:   08/25/20 1326  BP: 134/74  Pulse: 84  Resp: 16  Temp: 98.2 F (36.8 C)  SpO2: 98%  Weight: 220 lb 1.6 oz (99.8 kg)  Height: 5\' 11"  (1.803 m)    Body mass index is 30.7 kg/m.  Physical Exam Vitals and nursing note reviewed.  Constitutional:      General: He is not in acute distress.    Appearance: He is not ill-appearing, toxic-appearing or diaphoretic.  HENT:     Mouth/Throat:     Pharynx: Oropharynx is clear.  Eyes:     General:        Right eye: No discharge.        Left eye: No discharge.  Cardiovascular:     Rate and Rhythm: Normal rate and regular rhythm.     Pulses: Normal pulses.     Heart sounds: Normal heart sounds.  Pulmonary:     Breath sounds: Normal breath sounds.   Abdominal:     General: Bowel sounds are normal. There is no distension.     Palpations: Abdomen is soft.     Tenderness: There is no abdominal tenderness.  Musculoskeletal:     Right lower leg: No edema.     Left lower leg: No edema.  Neurological:     Mental Status: He is alert.     Gait: Gait abnormal.     Comments: Right UE hemiplegia Right LE hemiparesis   Psychiatric:        Mood and Affect: Mood normal.        Behavior: Behavior normal.         Assessment & Plan:  ICD-10-CM   1. Hypertension goal BP (blood pressure) < 140/90  I10 amLODipine (NORVASC) 10 MG tablet    carvedilol (COREG) 6.25 MG tablet    COMPLETE METABOLIC PANEL WITH GFR   improved, BP at goal today, stable, well controlled continue same meds as noted in HPI with diet and lifestyle efforts as able  2. Abdominal aortic atherosclerosis (HCC)  I70.0 rosuvastatin (CRESTOR) 40 MG tablet   on statin, monitoring  3. Hyperlipidemia LDL goal <70  E78.5 rosuvastatin (CRESTOR) 40 MG tablet   lipids at goal with last labs, continue statin and fatty acid supplements, good compliance and tolerance, no SE or concerns  4. Encounter for hepatitis C screening test for low risk patient  Z11.59 Hepatitis C Antibody  5. Encounter for medication monitoring  Z51.81 COMPLETE METABOLIC PANEL WITH GFR     No follow-ups on file.   Danelle Berry, PA-C 08/25/20 1:30 PM

## 2020-08-26 LAB — COMPLETE METABOLIC PANEL WITH GFR
AG Ratio: 1.5 (calc) (ref 1.0–2.5)
ALT: 31 U/L (ref 9–46)
AST: 25 U/L (ref 10–35)
Albumin: 4.3 g/dL (ref 3.6–5.1)
Alkaline phosphatase (APISO): 65 U/L (ref 35–144)
BUN: 14 mg/dL (ref 7–25)
CO2: 30 mmol/L (ref 20–32)
Calcium: 9.4 mg/dL (ref 8.6–10.3)
Chloride: 102 mmol/L (ref 98–110)
Creat: 0.96 mg/dL (ref 0.70–1.25)
GFR, Est African American: 95 mL/min/{1.73_m2} (ref >=60)
GFR, Est Non African American: 82 mL/min/{1.73_m2} (ref >=60)
Globulin: 2.8 g/dL (calc) (ref 1.9–3.7)
Glucose, Bld: 102 mg/dL — ABNORMAL HIGH (ref 65–99)
Potassium: 4.2 mmol/L (ref 3.5–5.3)
Sodium: 139 mmol/L (ref 135–146)
Total Bilirubin: 0.4 mg/dL (ref 0.2–1.2)
Total Protein: 7.1 g/dL (ref 6.1–8.1)

## 2020-08-26 LAB — HEPATITIS C ANTIBODY
Hepatitis C Ab: NONREACTIVE
SIGNAL TO CUT-OFF: 0.01 (ref <1.00)

## 2020-10-30 ENCOUNTER — Encounter: Payer: Medicare Other | Admitting: Physical Medicine & Rehabilitation

## 2020-11-10 ENCOUNTER — Ambulatory Visit: Payer: Self-pay | Admitting: *Deleted

## 2020-11-10 NOTE — Telephone Encounter (Signed)
Reason for Disposition . [1] Fever > 101 F (38.3 C) AND [2] age > 60 years  Answer Assessment - Initial Assessment Questions 1. TEMPERATURE: "What is the most recent temperature?"  "How was it measured?"      Today 100 today was 103 before tylenol 2. ONSET: "When did the fever start?"      Last night 3. CHILLS: "Do you have chills?" If yes: "How bad are they?"  (e.g., none, mild, moderate, severe)   - NONE: no chills   - MILD: feeling cold   - MODERATE: feeling very cold, some shivering (feels better under a thick blanket)   - SEVERE: feeling extremely cold with shaking chills (general body shaking, rigors; even under a thick blanket)      no 4. OTHER SYMPTOMS: "Do you have any other symptoms besides the fever?"  (e.g., abdomen pain, cough, diarrhea, earache, headache, sore throat, urination pain)     ST 5. CAUSE: If there are no symptoms, ask: "What do you think is causing the fever?"      Been around sick people 6. CONTACTS: "Does anyone else in the family have an infection?"     yes 7. TREATMENT: "What have you done so far to treat this fever?" (e.g., medications)     Covid 8. IMMUNOCOMPROMISE: "Do you have of the following: diabetes, HIV positive, splenectomy, cancer chemotherapy, chronic steroid treatment, transplant patient, etc."     no 9. PREGNANCY: "Is there any chance you are pregnant?" "When was your last menstrual period?"     na 10. TRAVEL: "Have you traveled out of the country in the last month?" (e.g., travel history, exposures)       no  Protocols used: Fever-A-AH, Coronavirus (COVID-19) Diagnosed or Suspected-A-AH

## 2020-11-11 ENCOUNTER — Telehealth (INDEPENDENT_AMBULATORY_CARE_PROVIDER_SITE_OTHER): Payer: Medicare Other | Admitting: Family Medicine

## 2020-11-11 ENCOUNTER — Encounter: Payer: Self-pay | Admitting: Family Medicine

## 2020-11-11 VITALS — BP 132/78 | HR 66 | Temp 98.1°F | Resp 16 | Ht 71.0 in | Wt 220.0 lb

## 2020-11-11 DIAGNOSIS — U071 COVID-19: Secondary | ICD-10-CM | POA: Diagnosis not present

## 2020-11-11 DIAGNOSIS — J069 Acute upper respiratory infection, unspecified: Secondary | ICD-10-CM | POA: Diagnosis not present

## 2020-11-11 DIAGNOSIS — Z1211 Encounter for screening for malignant neoplasm of colon: Secondary | ICD-10-CM

## 2020-11-11 MED ORDER — NIRMATRELVIR/RITONAVIR (PAXLOVID)TABLET: 3.0000 | ORAL_TABLET | Freq: Two times a day (BID) | ORAL | 0 refills | Status: AC

## 2020-11-11 MED ORDER — NIRMATRELVIR/RITONAVIR (PAXLOVID)TABLET: 3.0000 | ORAL_TABLET | Freq: Two times a day (BID) | ORAL | 0 refills | Status: DC

## 2020-11-11 NOTE — Progress Notes (Signed)
Name: [name]   MRN: [id number]    DOB: 1955-07-22   Date:11/11/2020       Progress Note  Subjective:    Chief Complaint  Chief Complaint  Patient presents with   Covid Positive    I connected with  [name]  on 11/11/20 at  1:20 PM EDT by a video enabled telemedicine application and verified that I am speaking with the correct person using two identifiers.  I discussed the limitations of evaluation and management by telemedicine and the availability of in person appointments. The patient expressed understanding and agreed to proceed. Staff also discussed with the patient that there may be a patient responsible charge related to this service. Patient Location:  [facility] Provider Location: [facility] Additional Individuals present: none  HPI Presents today COVID positive, onset of sx Friday night Day 4 of sx Was having cough, fatigue, myalgias, fever, sore throat yesterday but starting to feel better today, he had been around sick people  Using only Cordicedin- and tylenol to manage sx and fever, tmax 103, today afebrile w/o meds Fever better today, cough less bothersome, only a little head grogginess and some mild fatigue  Feeling better He is vaccinated x2 and boosted nov 2021  He denies HA, nasal sx, CP, SOB, sweats  Patient Active Problem List   Diagnosis Date Noted   Obesity (BMI 30.0-34.9) 03/10/2018   Abnormal EKG 12/15/2017   Hypothyroidism 03/09/2017   Abdominal aortic atherosclerosis (HCC) 05/20/2016   Pre-diabetes 02/18/2015   Atherosclerosis of both carotid arteries 02/18/2015   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   Shoulder subluxation, right 04/23/2014   Spastic hemiplegia affecting dominant side (HCC) 02/18/2014   Hypertension goal BP (blood pressure) < 140/90 01/17/2014   Hyperlipidemia LDL goal <70 01/17/2014    Social History   Tobacco Use   Smoking status: Never   Smokeless tobacco: Never  Substance Use Topics   Alcohol use:  No    Alcohol/week: 0.0 standard drinks     Current Outpatient Medications:    acetaminophen (TYLENOL) 500 MG tablet, Take 1,000 mg by mouth daily as needed for moderate pain., Disp: , Rfl:    amLODipine (NORVASC) 10 MG tablet, Take 1 tablet (10 mg total) by mouth daily., Disp: 90 tablet, Rfl: 3   aspirin EC 325 MG tablet, TAKE 1 TABLET (325 MG TOTAL) BY MOUTH DAILY., Disp: 90 tablet, Rfl: 3   carvedilol (COREG) 6.25 MG tablet, Take 1 tablet (6.25 mg total) by mouth 2 (two) times daily with a meal., Disp: 180 tablet, Rfl: 3   cloNIDine (CATAPRES) 0.1 MG tablet, Take 0.1 mg by mouth as needed. , Disp: , Rfl:    losartan-hydrochlorothiazide (HYZAAR) 100-12.5 MG tablet, Take 1 tablet by mouth daily., Disp: 90 tablet, Rfl: 3   Multiple Vitamins-Minerals (ONE-A-DAY MENS HEALTH FORMULA) TABS, Take 1 tablet by mouth daily., Disp: , Rfl:    Naphazoline-Hypromellose (TGT LUBRICANT REDNESS RELIEVER OP), Place 1 drop into both eyes daily as needed (redness/ dryness)., Disp: , Rfl:    Omega-3 Fatty Acids (FISH OIL) 1000 MG CAPS, Take 1,000 mg by mouth daily., Disp: , Rfl:    rosuvastatin (CRESTOR) 40 MG tablet, TAKE 1 TABLET BY MOUTH AT  BEDTIME FOR CHOLESTEROL, Disp: 90 tablet, Rfl: 3  No Known Allergies  I personally reviewed active problem list, medication list, allergies, family history, social history, health maintenance, notes from last encounter, lab results, imaging with the patient/caregiver today.   Review of Systems  Constitutional: Negative.   HENT: Negative.    Eyes: Negative.   Respiratory: Negative.    Cardiovascular: Negative.   Gastrointestinal: Negative.   Endocrine: Negative.   Genitourinary: Negative.   Musculoskeletal: Negative.   Skin: Negative.   Allergic/Immunologic: Negative.   Neurological: Negative.   Hematological: Negative.   Psychiatric/Behavioral: Negative.    All other systems reviewed and are negative.    Objective:   Virtual encounter, vitals limited,  only able to obtain the following Today's Vitals   11/11/20 1058  Weight: 220 lb (99.8 kg)  Height: 5\' 11"  (1.803 m)  PainSc: 0-No pain   Body mass index is 30.68 kg/m. Nursing Note and Vital Signs reviewed.  Physical Exam Vitals and nursing note reviewed.  Constitutional:      General: He is not in acute distress.    Appearance: Normal appearance. He is not ill-appearing, toxic-appearing or diaphoretic.  Neck:     Trachea: Phonation normal.  Pulmonary:     Effort: No respiratory distress.  Neurological:     Mental Status: He is alert. Mental status is at baseline.    PE limited by telephone encounter  No results found for this or any previous visit (from the past 72 hour(s)).  Assessment and Plan:     ICD-10-CM   1. Upper respiratory tract infection due to COVID-19 virus  U07.1 nirmatrelvir/ritonavir EUA (PAXLOVID) TABS   J06.9    mild sx, already improving sx, pt in window for paxlovid tx, would really only tx if his sx worsen at all today, if improving con't supportive mgmt    2. Colon cancer screening  Z12.11 Cologuard      Good renal function - reviewed isolation protocol and clearance with pt in setting of COVID, reviewed red flags, pt currently well appearing w/o concerning sx or signs - VS done at home are also reassuring Discussed +/- paxlovid, dosing etc with pt   -Red flags and when to present for emergency care or RTC including fever >101.60F, chest pain, shortness of breath, new/worsening/un-resolving symptoms, reviewed with patient at time of visit. Follow up and care instructions discussed and provided in AVS. - I discussed the assessment and treatment plan with the patient. The patient was provided an opportunity to ask questions and all were answered. The patient agreed with the plan and demonstrated an understanding of the instructions.  I provided 15 minutes of non-face-to-face time during this encounter.  , PA-C 11/11/20 1:33 PM

## 2020-12-09 ENCOUNTER — Encounter: Payer: Medicare Other | Attending: Physical Medicine & Rehabilitation | Admitting: Physical Medicine & Rehabilitation

## 2020-12-09 ENCOUNTER — Other Ambulatory Visit: Payer: Self-pay

## 2020-12-09 ENCOUNTER — Encounter: Payer: Self-pay | Admitting: Physical Medicine & Rehabilitation

## 2020-12-09 VITALS — BP 156/85 | HR 61 | Temp 98.1°F | Ht 71.0 in | Wt 211.4 lb

## 2020-12-09 DIAGNOSIS — G8110 Spastic hemiplegia affecting unspecified side: Secondary | ICD-10-CM

## 2020-12-09 NOTE — Progress Notes (Signed)
Botox Injection for spasticity using needle EMG guidance  Dilution: 50 Units/ml Indication: Severe spasticity which interferes with ADL,mobility and/or  hygiene and is unresponsive to medication management and other conservative care Informed consent was obtained after describing risks and benefits of the procedure with the patient. This includes bleeding, bruising, infection, excessive weakness, or medication side effects. A REMS form is on file and signed. Last botulinum toxin injection was performed 04/08/2020 Needle: 27g 1" needle electrode Number of units per muscle Biceps100 3+ Brachialis 100 3+  Brachioradialis 50U  3+ FCR50 2+  FDS 252+ FDP50 2+ PT25 2+  All injections were done after obtaining appropriate EMG activity and after negative drawback for blood. The patient tolerated the procedure well. Post procedure instructions were given. A followup appointment was made.

## 2020-12-09 NOTE — Patient Instructions (Signed)

## 2021-02-24 ENCOUNTER — Ambulatory Visit: Payer: Medicare Other | Admitting: Nurse Practitioner

## 2021-03-12 ENCOUNTER — Other Ambulatory Visit: Payer: Self-pay

## 2021-03-12 ENCOUNTER — Encounter: Payer: Medicare Other | Attending: Physical Medicine & Rehabilitation | Admitting: Physical Medicine & Rehabilitation

## 2021-03-12 ENCOUNTER — Encounter: Payer: Self-pay | Admitting: Physical Medicine & Rehabilitation

## 2021-03-12 VITALS — BP 163/86 | HR 67 | Temp 98.1°F | Ht 71.0 in | Wt 216.4 lb

## 2021-03-12 DIAGNOSIS — G8110 Spastic hemiplegia affecting unspecified side: Secondary | ICD-10-CM | POA: Diagnosis present

## 2021-03-12 NOTE — Progress Notes (Signed)
Botox Injection for spasticity using needle EMG guidance  Dilution: 50 Units/ml Indication: Severe spasticity which interferes with ADL,mobility and/or  hygiene and is unresponsive to medication management and other conservative care Informed consent was obtained after describing risks and benefits of the procedure with the patient. This includes bleeding, bruising, infection, excessive weakness, or medication side effects. A REMS form is on file and signed. Last botulinum toxin injection was performed 04/08/2020 Needle: 27g 1" needle electrode Number of units per muscle Biceps100 3+ Brachialis 100 3+  Brachioradialis 50U  3+ FCR50 2+  FDS 252+ FDP50 2+ PT25 2+  All injections were done after obtaining appropriate EMG activity and after negative drawback for blood. The patient tolerated the procedure well. Post procedure instructions were given. A followup appointment was made.  

## 2021-03-19 ENCOUNTER — Ambulatory Visit: Payer: Medicare Other | Admitting: Family Medicine

## 2021-03-31 ENCOUNTER — Ambulatory Visit: Payer: Medicare Other | Admitting: Family Medicine

## 2021-04-01 ENCOUNTER — Other Ambulatory Visit: Payer: Self-pay | Admitting: Family Medicine

## 2021-04-01 DIAGNOSIS — I10 Essential (primary) hypertension: Secondary | ICD-10-CM

## 2021-04-02 NOTE — Telephone Encounter (Signed)
Requested Prescriptions  Pending Prescriptions Disp Refills  . losartan-hydrochlorothiazide (HYZAAR) 100-12.5 MG tablet [Pharmacy Med Name: Losartan Potassium-HCTZ 100-12.5 MG Oral Tablet] 90 tablet 0    Sig: TAKE 1 TABLET BY MOUTH  DAILY     Cardiovascular: ARB + Diuretic Combos Failed - 04/01/2021 11:36 PM      Failed - K in normal range and within 180 days    Potassium  Date Value Ref Range Status  08/25/2020 4.2 3.5 - 5.3 mmol/L Final  03/07/2014 3.6 3.5 - 5.1 mmol/L Final         Failed - Na in normal range and within 180 days    Sodium  Date Value Ref Range Status  08/25/2020 139 135 - 146 mmol/L Final  03/11/2015 137 136 - 144 mmol/L Final    Comment:                  **Please note reference interval change**  03/07/2014 139 136 - 145 mmol/L Final         Failed - Cr in normal range and within 180 days    Creat  Date Value Ref Range Status  08/25/2020 0.96 0.70 - 1.25 mg/dL Final    Comment:    For patients >37 years of age, the reference limit for Creatinine is approximately 13% higher for people identified as African-American. .    Creatinine, Urine  Date Value Ref Range Status  05/14/2016 126 mg/dL Final         Failed - Ca in normal range and within 180 days    Calcium  Date Value Ref Range Status  08/25/2020 9.4 8.6 - 10.3 mg/dL Final   Calcium, Total  Date Value Ref Range Status  03/07/2014 8.4 (L) 8.5 - 10.1 mg/dL Final         Failed - Last BP in normal range    BP Readings from Last 1 Encounters:  03/12/21 (!) 163/86         Passed - Patient is not pregnant      Passed - Valid encounter within last 6 months    Recent Outpatient Visits          4 months ago Upper respiratory tract infection due to COVID-19 virus   [facility] [place], Sheliah Mends, PA-C   7 months ago Hypertension goal BP (blood pressure) < 140/90   [facility] [place], Sheliah Mends, PA-C   11 months ago Hypertension goal BP (blood pressure) < 140/90    [facility] [place], Sheliah Mends, PA-C   11 months ago Hypertension goal BP (blood pressure) < 140/90   [facility] Welford Roche D, MD   1 year ago Hypertension goal BP (blood pressure) < 140/90   [facility] Danelle Berry, PA-C      Future Appointments            In 1 week Danelle Berry, PA-C [facility]   In 3 months  [facility], [facility]

## 2021-04-10 ENCOUNTER — Other Ambulatory Visit: Payer: Self-pay

## 2021-04-10 ENCOUNTER — Ambulatory Visit (INDEPENDENT_AMBULATORY_CARE_PROVIDER_SITE_OTHER): Payer: Medicare Other | Admitting: Nurse Practitioner

## 2021-04-10 ENCOUNTER — Encounter: Payer: Self-pay | Admitting: Nurse Practitioner

## 2021-04-10 VITALS — BP 132/68 | HR 91 | Temp 97.8°F | Resp 16 | Ht 71.0 in | Wt 212.8 lb

## 2021-04-10 DIAGNOSIS — I10 Essential (primary) hypertension: Secondary | ICD-10-CM | POA: Diagnosis not present

## 2021-04-10 DIAGNOSIS — Z23 Encounter for immunization: Secondary | ICD-10-CM

## 2021-04-10 DIAGNOSIS — E785 Hyperlipidemia, unspecified: Secondary | ICD-10-CM

## 2021-04-10 DIAGNOSIS — E039 Hypothyroidism, unspecified: Secondary | ICD-10-CM

## 2021-04-10 DIAGNOSIS — Z5181 Encounter for therapeutic drug level monitoring: Secondary | ICD-10-CM

## 2021-04-10 DIAGNOSIS — Z131 Encounter for screening for diabetes mellitus: Secondary | ICD-10-CM

## 2021-04-10 MED ORDER — CLONIDINE HCL 0.1 MG PO TABS: 0.1000 mg | ORAL_TABLET | ORAL | 0 refills | Status: DC | PRN

## 2021-04-10 NOTE — Progress Notes (Signed)
BP 132/68   Pulse 91   Temp 97.8 F (36.6 C)   Resp 16   Ht 5\' 11"  (1.803 m)   Wt 212 lb 12.8 oz (96.5 kg)   SpO2 97%   BMI 29.68 kg/m    Subjective:    Patient ID: , male    DOB: 05-12-1956, 65 y.o.   MRN: [id number]  HPI: [name] is a 65 y.o. male, here alone  Chief Complaint  Patient presents with   Hypertension   Hyperlipidemia   Medication Refill    Does he need refill on clonidine states he has 1 pill left uses very sparingly   HTN: He says his blood pressure has been running good for awhile. He says his blood pressure at home is in the 120s-130s/70s.  He is currently taking amlodipine 10 mg daily, carvedilol 6.25 mg daily, and losartan-hydrochlorothiazide 100-12.5 mg daily.  He uses clonidine every once in while if his blood pressure gets high. He says he hasn't needed it in awhile.  He says he has one pill left.  Will send in refill.  He denies any chest pain, shortness of breath, dizziness, headache or blurred vision.   Hyperlipidemia: His LDL was 89 on 10/26/2019, will get labs today.  He is currently taking rosuvastatin 40 mg daily.  He denies any myalgia.    Hypothyroidism: He is not currently taking any medication for hypothyroidism.  He denies any weight loss or gain.  He denies any heat or cold intolerance.  Last TSH done in 10/26/19 and it was 3.65. Will get labs today.   Relevant past medical, surgical, family and social history reviewed and updated as indicated. Interim medical history since our last visit reviewed. Allergies and medications reviewed and updated.  Review of Systems  Constitutional: Negative for fever or weight change.  Respiratory: Negative for cough and shortness of breath.   Cardiovascular: Negative for chest pain or palpitations.  Gastrointestinal: Negative for abdominal pain, no bowel changes.  Musculoskeletal: Positive for gait problem or joint swelling.  Skin: Negative for rash.  Neurological: Negative for  dizziness or headache.  No other specific complaints in a complete review of systems (except as listed in HPI above).      Objective:    BP 132/68   Pulse 91   Temp 97.8 F (36.6 C)   Resp 16   Ht 5\' 11"  (1.803 m)   Wt 212 lb 12.8 oz (96.5 kg)   SpO2 97%   BMI 29.68 kg/m   Wt Readings from Last 3 Encounters:  04/10/21 212 lb 12.8 oz (96.5 kg)  03/12/21 216 lb 6.4 oz (98.2 kg)  12/09/20 211 lb 6.4 oz (95.9 kg)    Physical Exam  Constitutional: Patient appears well-developed and well-nourished. Overweight No distress.  HEENT: head atraumatic, normocephalic, pupils equal and reactive to light, neck supple Cardiovascular: Normal rate, regular rhythm and normal heart sounds.  No murmur heard. No BLE edema. Pulmonary/Chest: Effort normal and breath sounds normal. No respiratory distress. Abdominal: Soft.  There is no tenderness. Psychiatric: Patient has a normal mood and affect. behavior is normal. Judgment and thought content normal.   Results for orders placed or performed in visit on 08/25/20  Hepatitis C Antibody  Result Value Ref Range   Hepatitis C Ab NON-REACTIVE NON-REACTI   SIGNAL TO CUT-OFF 0.01 <1.00  COMPLETE METABOLIC PANEL WITH GFR  Result Value Ref Range   Glucose, Bld 102 (H) 65 - 99 mg/dL  BUN 14 7 - 25 mg/dL   Creat 1.55 2.08 - 0.22 mg/dL   GFR, Est Non African American 82 > OR = 60 mL/min/1.47m2   GFR, Est African American 95 > OR = 60 mL/min/1.32m2   BUN/Creatinine Ratio NOT APPLICABLE 6 - 22 (calc)   Sodium 139 135 - 146 mmol/L   Potassium 4.2 3.5 - 5.3 mmol/L   Chloride 102 98 - 110 mmol/L   CO2 30 20 - 32 mmol/L   Calcium 9.4 8.6 - 10.3 mg/dL   Total Protein 7.1 6.1 - 8.1 g/dL   Albumin 4.3 3.6 - 5.1 g/dL   Globulin 2.8 1.9 - 3.7 g/dL (calc)   AG Ratio 1.5 1.0 - 2.5 (calc)   Total Bilirubin 0.4 0.2 - 1.2 mg/dL   Alkaline phosphatase (APISO) 65 35 - 144 U/L   AST 25 10 - 35 U/L   ALT 31 9 - 46 U/L      Assessment & Plan:   1. Hypertension  goal BP (blood pressure) < 140/90 -continue current treatment - CBC with Differential/Platelet - COMPLETE METABOLIC PANEL WITH GFR - cloNIDine (CATAPRES) 0.1 MG tablet; Take 1 tablet (0.1 mg total) by mouth as needed.  Dispense: 30 tablet; Refill: 0  2. Hyperlipidemia LDL goal <70 -continue current treatment - Lipid panel  3. Hypothyroidism, unspecified type -continue to monitor - TSH  4. Encounter for medication monitoring  - Lipid panel - COMPLETE METABOLIC PANEL WITH GFR  5. Screening for diabetes mellitus  - Hemoglobin A1c  6. Need for influenza vaccination  - Flu Vaccine QUAD High Dose(Fluad)   Follow up plan: Return in about 6 months (around 10/08/2021) for follow up.

## 2021-04-11 LAB — CBC WITH DIFFERENTIAL/PLATELET
Absolute Monocytes: 452 cells/uL (ref 200–950)
Basophils Absolute: 52 cells/uL (ref 0–200)
Basophils Relative: 1 %
Eosinophils Absolute: 172 cells/uL (ref 15–500)
Eosinophils Relative: 3.3 %
HCT: 42.9 % (ref 38.5–50.0)
Hemoglobin: 14.1 g/dL (ref 13.2–17.1)
Lymphs Abs: 1362 cells/uL (ref 850–3900)
MCH: 28.6 pg (ref 27.0–33.0)
MCHC: 32.9 g/dL (ref 32.0–36.0)
MCV: 87 fL (ref 80.0–100.0)
MPV: 11 fL (ref 7.5–12.5)
Monocytes Relative: 8.7 %
Neutro Abs: 3162 cells/uL (ref 1500–7800)
Neutrophils Relative %: 60.8 %
Platelets: 291 10*3/uL (ref 140–400)
RBC: 4.93 10*6/uL (ref 4.20–5.80)
RDW: 13.1 % (ref 11.0–15.0)
Total Lymphocyte: 26.2 %
WBC: 5.2 10*3/uL (ref 3.8–10.8)

## 2021-04-11 LAB — LIPID PANEL
Cholesterol: 155 mg/dL (ref <200)
HDL: 54 mg/dL (ref >=40)
LDL Cholesterol (Calc): 85 mg/dL (calc)
Non-HDL Cholesterol (Calc): 101 mg/dL (calc) (ref <130)
Total CHOL/HDL Ratio: 2.9 (calc) (ref <5.0)
Triglycerides: 74 mg/dL (ref <150)

## 2021-04-11 LAB — COMPLETE METABOLIC PANEL WITH GFR
AG Ratio: 1.5 (calc) (ref 1.0–2.5)
ALT: 29 U/L (ref 9–46)
AST: 26 U/L (ref 10–35)
Albumin: 4.3 g/dL (ref 3.6–5.1)
Alkaline phosphatase (APISO): 68 U/L (ref 35–144)
BUN: 15 mg/dL (ref 7–25)
CO2: 27 mmol/L (ref 20–32)
Calcium: 9.6 mg/dL (ref 8.6–10.3)
Chloride: 103 mmol/L (ref 98–110)
Creat: 0.91 mg/dL (ref 0.70–1.35)
Globulin: 2.8 g/dL (calc) (ref 1.9–3.7)
Glucose, Bld: 93 mg/dL (ref 65–99)
Potassium: 3.7 mmol/L (ref 3.5–5.3)
Sodium: 140 mmol/L (ref 135–146)
Total Bilirubin: 0.4 mg/dL (ref 0.2–1.2)
Total Protein: 7.1 g/dL (ref 6.1–8.1)
eGFR: 93 mL/min/{1.73_m2} (ref >=60)

## 2021-04-11 LAB — HEMOGLOBIN A1C
Hgb A1c MFr Bld: 6.1 % of total Hgb — ABNORMAL HIGH (ref <5.7)
Mean Plasma Glucose: 128 mg/dL
eAG (mmol/L): 7.1 mmol/L

## 2021-04-11 LAB — TSH: TSH: 2.28 mIU/L (ref 0.40–4.50)

## 2021-04-13 ENCOUNTER — Encounter: Payer: Self-pay | Admitting: Nurse Practitioner

## 2021-04-15 ENCOUNTER — Ambulatory Visit: Payer: Self-pay | Admitting: *Deleted

## 2021-04-15 NOTE — Telephone Encounter (Signed)
Reason for Disposition  All other urine symptoms  Answer Assessment - Initial Assessment Questions 1. SYMPTOM: "What's the main symptom you're concerned about?" (e.g., frequency, incontinence)     Urinary burning at the end of urination 2. ONSET: "When did the  symptoms   start?"     Saturday 04/11/21 3. PAIN: "Is there any pain?" If Yes, ask: "How bad is it?" (Scale: 1-10; mild, moderate, severe)     Pain , level 2  4. CAUSE: "What do you think is causing the symptoms?"     UTI 5. OTHER SYMPTOMS: "Do you have any other symptoms?" (e.g., fever, flank pain, blood in urine, pain with urination)     Pain at the end of urination 6. PREGNANCY: "Is there any chance you are pregnant?" "When was your last menstrual period?"     na  Protocols used: Urinary Symptoms-A-AH

## 2021-04-15 NOTE — Telephone Encounter (Signed)
C/o urinary irritation and burning toward the end of urinating. C/o fever aftter receiving flu shot on Friday 04/10/21 and developing a fever of 102. Saturday 04/11/21 reports irritation , burning noted at the end of urinating. Denies fever now, pain in back or flank, no blood in urine at this time. Patient reports he did not take his losartan- hydrochlorothiazide today due to frequency urinating. Encouraged patient not to stop taking medication. patient reports symptoms "not that bad" but wanted to notify PCP before weekend. if antibiotic prescribed [facility] [place], [address] Care advise given. Patient verbalized understanding of care advise and to call back or do E visit or go to [facility] or ED if symptoms worsen.

## 2021-06-12 ENCOUNTER — Encounter: Payer: Medicare Other | Attending: Physical Medicine & Rehabilitation | Admitting: Physical Medicine & Rehabilitation

## 2021-06-12 ENCOUNTER — Other Ambulatory Visit: Payer: Self-pay

## 2021-06-12 ENCOUNTER — Encounter: Payer: Self-pay | Admitting: Physical Medicine & Rehabilitation

## 2021-06-12 VITALS — BP 122/71 | HR 62 | Ht 71.0 in | Wt 210.0 lb

## 2021-06-12 DIAGNOSIS — G8110 Spastic hemiplegia affecting unspecified side: Secondary | ICD-10-CM | POA: Insufficient documentation

## 2021-06-12 NOTE — Progress Notes (Signed)
Botox Injection for spasticity using needle EMG guidance  Dilution: 50 Units/ml Indication: Severe spasticity which interferes with ADL,mobility and/or  hygiene and is unresponsive to medication management and other conservative care Informed consent was obtained after describing risks and benefits of the procedure with the patient. This includes bleeding, bruising, infection, excessive weakness, or medication side effects. A REMS form is on file and signed. Last botulinum toxin injection was performed 04/08/2020 Needle: 27g 1" needle electrode Number of units per muscle Biceps100 3+ Brachialis 100 3+  Brachioradialis 50U  3+ FCR50 2+  FDS 252+ FDP50 2+ PT25 2+  All injections were done after obtaining appropriate EMG activity and after negative drawback for blood. The patient tolerated the procedure well. Post procedure instructions were given. A followup appointment was made.  

## 2021-06-12 NOTE — Patient Instructions (Signed)

## 2021-06-15 ENCOUNTER — Other Ambulatory Visit: Payer: Self-pay | Admitting: Nurse Practitioner

## 2021-06-15 DIAGNOSIS — I10 Essential (primary) hypertension: Secondary | ICD-10-CM

## 2021-06-15 NOTE — Telephone Encounter (Signed)
Requested medication (s) are due for refill today:   Yes  Requested medication (s) are on the active medication list:   Yes  Future visit scheduled:   Yes   Last ordered: 04/10/2021 #30, 0 refills  Returned because mail order pharmacy requesting a 1 yr. supply   Requested Prescriptions  Pending Prescriptions Disp Refills   cloNIDine (CATAPRES) 0.1 MG tablet [Pharmacy Med Name: cloNIDine HCl 0.1 MG Oral Tablet] 90 tablet 3    Sig: TAKE 1 TABLET BY MOUTH DAILY     Cardiovascular:  Alpha-2 Agonists Passed - 06/15/2021  6:00 AM      Passed - Last BP in normal range    BP Readings from Last 1 Encounters:  06/12/21 122/71          Passed - Last Heart Rate in normal range    Pulse Readings from Last 1 Encounters:  06/12/21 62          Passed - Valid encounter within last 6 months    Recent Outpatient Visits           2 months ago Hypertension goal BP (blood pressure) < 140/90   [facility] Serafina Royals F, FNP   7 months ago Upper respiratory tract infection due to COVID-19 virus   [facility] [place], Kristeen Miss, PA-C   9 months ago Hypertension goal BP (blood pressure) < 140/90   [facility] [place], Kristeen Miss, PA-C   1 year ago Hypertension goal BP (blood pressure) < 140/90   [facility] [place], Kristeen Miss, PA-C   1 year ago Hypertension goal BP (blood pressure) < 140/90   [facility] Towanda Malkin, MD       Future Appointments             In 2 weeks  [place]   In 3 months Reece Packer, Myna Hidalgo, [facility], [facility]

## 2021-06-25 ENCOUNTER — Other Ambulatory Visit: Payer: Self-pay | Admitting: Family Medicine

## 2021-06-25 DIAGNOSIS — I10 Essential (primary) hypertension: Secondary | ICD-10-CM

## 2021-06-25 NOTE — Telephone Encounter (Signed)
Requested medications are due for refill today.  yes  Requested medications are on the active medications list.  yes  Last refill. 04/02/2021  Future visit scheduled.   yes  Notes to clinic.  Pharmacy requesting a 1 year prescription.    Requested Prescriptions  Pending Prescriptions Disp Refills   losartan-hydrochlorothiazide (HYZAAR) 100-12.5 MG tablet [Pharmacy Med Name: Losartan Potassium-HCTZ 100-12.5 MG Oral Tablet] 90 tablet 3    Sig: TAKE 1 TABLET BY MOUTH  DAILY     Cardiovascular: ARB + Diuretic Combos Passed - 06/25/2021 10:21 PM      Passed - K in normal range and within 180 days    Potassium  Date Value Ref Range Status  04/10/2021 3.7 3.5 - 5.3 mmol/L Final  03/07/2014 3.6 3.5 - 5.1 mmol/L Final          Passed - Na in normal range and within 180 days    Sodium  Date Value Ref Range Status  04/10/2021 140 135 - 146 mmol/L Final  03/11/2015 137 136 - 144 mmol/L Final    Comment:                  **Please note reference interval change**  03/07/2014 139 136 - 145 mmol/L Final          Passed - Cr in normal range and within 180 days    Creat  Date Value Ref Range Status  04/10/2021 0.91 0.70 - 1.35 mg/dL Final   Creatinine, Urine  Date Value Ref Range Status  05/14/2016 126 mg/dL Final          Passed - eGFR is 10 or above and within 180 days    GFR, Est African American  Date Value Ref Range Status  08/25/2020 95 > OR = 60 mL/min/1.36m2 Final   GFR, Est Non African American  Date Value Ref Range Status  08/25/2020 82 > OR = 60 mL/min/1.43m2 Final   eGFR  Date Value Ref Range Status  04/10/2021 93 > OR = 60 mL/min/1.19m2 Final    Comment:    The eGFR is based on the CKD-EPI 2021 equation. To calculate  the new eGFR from a previous Creatinine or Cystatin C result, go to https://www.kidney.org/professionals/ kdoqi/gfr%5Fcalculator           Passed - Patient is not pregnant      Passed - Last BP in normal range    BP Readings from Last 1  Encounters:  06/12/21 122/71          Passed - Valid encounter within last 6 months    Recent Outpatient Visits           2 months ago Hypertension goal BP (blood pressure) < 140/90   [facility] Serafina Royals F, FNP   7 months ago Upper respiratory tract infection due to COVID-19 virus   [facility] Delsa Grana, PA-C   10 months ago Hypertension goal BP (blood pressure) < 140/90   [facility] [place], Kristeen Miss, PA-C   1 year ago Hypertension goal BP (blood pressure) < 140/90   [facility] Delsa Grana, PA-C   1 year ago Hypertension goal BP (blood pressure) < 140/90   [facility] Towanda Malkin, MD       Future Appointments             In 3 months Reece Packer, Myna Hidalgo, [facility], [facility]

## 2021-06-30 ENCOUNTER — Other Ambulatory Visit: Payer: Self-pay

## 2021-06-30 DIAGNOSIS — I10 Essential (primary) hypertension: Secondary | ICD-10-CM

## 2021-06-30 MED ORDER — LOSARTAN POTASSIUM-HCTZ 100-12.5 MG PO TABS: 1.0000 | ORAL_TABLET | Freq: Every day | ORAL | 0 refills | Status: DC

## 2021-07-02 ENCOUNTER — Ambulatory Visit: Payer: Medicare Other

## 2021-07-29 ENCOUNTER — Telehealth: Payer: Self-pay | Admitting: Family Medicine

## 2021-07-29 NOTE — Telephone Encounter (Signed)
Copied from [place] [phone]. Topic: Medicare AWV ?>> Jul 29, 2021  1:33 PM Cher Nakai R wrote: ?Reason for CRM:  ?Left message for patient to call back and schedule Medicare Annual Wellness Visit (AWV) in office.  ? ?If unable to come into the office for AWV,  please offer to do virtually or by telephone. ? ?Last AWV:  07/01/2020 ? ?Please schedule at anytime with Alexander. ? ?40 minute appointment ? ?Any questions, please contact me at [phone] ?

## 2021-08-06 ENCOUNTER — Ambulatory Visit (INDEPENDENT_AMBULATORY_CARE_PROVIDER_SITE_OTHER): Payer: Medicare Other

## 2021-08-06 DIAGNOSIS — Z0000 Encounter for general adult medical examination without abnormal findings: Secondary | ICD-10-CM | POA: Diagnosis not present

## 2021-08-06 NOTE — Progress Notes (Signed)
? ?Subjective:  ? [name] is a 66 y.o. male who presents for Medicare Annual/Subsequent preventive examination. ? ?Virtual Visit via Telephone Note ? ?I connected with  [name] on 08/06/21 at  3:30 PM EDT by telephone and verified that I am speaking with the correct person using two identifiers. ? ?Location: ?Patient: home ?Provider: Ashley ?Persons participating in the virtual visit: patient/Nurse Health Advisor ?  ?I discussed the limitations, risks, security and privacy concerns of performing an evaluation and management service by telephone and the availability of in person appointments. The patient expressed understanding and agreed to proceed. ? ?Interactive audio and video telecommunications were attempted between this nurse and patient, however failed, due to patient having technical difficulties OR patient did not have access to video capability.  We continued and completed visit with audio only. ? ?Some vital signs may be absent or patient reported.  ? ?Clemetine Marker, LPN ? ? ?Review of Systems    ? ?Cardiac Risk Factors include: advanced age (>42men, >36 women);dyslipidemia;male gender;hypertension ? ?   ?Objective:  ?  ?There were no vitals filed for this visit. ?There is no height or weight on file to calculate BMI. ? ?Advanced Directives 08/06/2021 07/01/2020 05/10/2020 04/06/2019 01/18/2018 06/30/2017 03/09/2017  ?Does Patient Have a Medical Advance Directive? Yes No No Yes No No No  ?Type of Paramedic of [place];Living will - - [place];Living will - - -  ?Does patient want to make changes to medical advance directive? - - - - - - -  ?Copy of [place] in Chart? No - copy requested - - No - copy requested - - -  ?Would patient like information on creating a medical advance directive? - Yes (MAU/Ambulatory/Procedural Areas - Information given) No - Patient declined - No - Patient declined No - Patient declined -  ?Pre-existing  out of facility DNR order (yellow form or pink MOST form) - - - - - - -  ? ? ?Current Medications (verified) ?Outpatient Encounter Medications as of 08/06/2021  ?Medication Sig  ? acetaminophen (TYLENOL) 500 MG tablet Take 1,000 mg by mouth daily as needed for moderate pain.  ? amLODipine (NORVASC) 10 MG tablet Take 1 tablet (10 mg total) by mouth daily.  ? aspirin EC 325 MG tablet TAKE 1 TABLET (325 MG TOTAL) BY MOUTH DAILY.  ? carvedilol (COREG) 6.25 MG tablet Take 1 tablet (6.25 mg total) by mouth 2 (two) times daily with a meal.  ? cloNIDine (CATAPRES) 0.1 MG tablet TAKE 1 TABLET BY MOUTH DAILY  ? losartan-hydrochlorothiazide (HYZAAR) 100-12.5 MG tablet Take 1 tablet by mouth daily.  ? Multiple Vitamins-Minerals (ONE-A-DAY MENS HEALTH FORMULA) TABS Take 1 tablet by mouth daily.  ? Naphazoline-Hypromellose (TGT LUBRICANT REDNESS RELIEVER OP) Place 1 drop into both eyes daily as needed (redness/ dryness).  ? Omega-3 Fatty Acids (FISH OIL) 1000 MG CAPS Take 1,000 mg by mouth daily.  ? rosuvastatin (CRESTOR) 40 MG tablet TAKE 1 TABLET BY MOUTH AT  BEDTIME FOR CHOLESTEROL  ? ?No facility-administered encounter medications on file as of 08/06/2021.  ? ? ?Allergies (verified) ?Patient has no known allergies.  ? ?History: ?Past Medical History:  ?Diagnosis Date  ? AAA (abdominal aortic aneurysm)   ? Bowel obstruction ([place]) 2016  ? during septic event. no further a problem once sepsis resolved  ? 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 Chronic kidney disease 2016  ?  happened after stroke. dr. Holley Raring follows him once a year  ? Hyperlipidemia   ? Hypertension   ? Hypothyroidism   ? Inguinal hernia   ? Mild atherosclerosis of carotid artery 12/2017  ? bilateral stenosis per vascular surgeon  ? Pink eye 06/21/2016  ? S/P Botox injection   ? every 3 months into right arm to loosen up muscles  ? Sepsis ([place]) 2016  ? happened after stroke. unsure when,but kidney disease started.  followed by dr. Holley Raring  ? Stroke ([place]) 12/24/2013  ? right side weakness  ? ?Past Surgical History:  ?Procedure Laterality Date  ? HERNIA REPAIR  12/23/2015  ? HYDROCELE EXCISION Left 01/18/2018  ? Procedure: HYDROCELECTOMY ADULT;  Surgeon: Hollice Espy, MD;  Location: [facility] ORS;  Service: Urology;  Laterality: Left;  ? INGUINAL HERNIA REPAIR Bilateral 06/25/2016  ? Procedure: HERNIA REPAIR INGUINAL ADULT BILATERAL;  Surgeon: Christene Lye, MD;  Location: [facility] ORS;  Service: General;  Laterality: Bilateral;  ? KNEE ARTHROSCOPY Right 1970's  ? ?Family History  ?Problem Relation Age of Onset  ? Hypertension Mother   ? Hernia Sister   ? Prostate cancer Neg Hx   ? Bladder Cancer Neg Hx   ? Kidney cancer Neg Hx   ? ?Social History  ? ?Socioeconomic History  ? Marital status: Married  ?  Spouse name: rosamund  ? Number of children: 0  ? Years of education: Not on file  ? Highest education level: Not on file  ?Occupational History  ? Not on file  ?Tobacco Use  ? Smoking status: Never  ? Smokeless tobacco: Never  ?Vaping Use  ? Vaping Use: Never used  ?Substance and Sexual Activity  ? Alcohol use: No  ?  Alcohol/week: 0.0 standard drinks  ? Drug use: No  ?  Comment: used to use THC in the late 1980s  ? Sexual activity: Not Currently  ?Other Topics Concern  ? Not on file  ?Social History Narrative  ? Not on file  ? ?Social Determinants of Health  ? ?Financial Resource Strain: Low Risk   ? Difficulty of Paying Living Expenses: Not very hard  ?Food Insecurity: No Food Insecurity  ? Worried About Charity fundraiser in the Last Year: Never true  ? Ran Out of Food in the Last Year: Never true  ?Transportation Needs: No Transportation Needs  ? Lack of Transportation (Medical): No  ? Lack of Transportation (Non-Medical): No  ?Physical Activity: Insufficiently Active  ? Days of Exercise per Week: 3 days  ? Minutes of Exercise per Session: 30 min  ?Stress: No Stress Concern Present  ? Feeling of Stress : Not at all  ?Social  Connections: Moderately Integrated  ? Frequency of Communication with Friends and Family: More than three times a week  ? Frequency of Social Gatherings with Friends and Family: Never  ? Attends Religious Services: More than 4 times per year  ? Active Member of Clubs or Organizations: No  ? Attends Archivist Meetings: Never  ? Marital Status: Married  ? ? ?Tobacco Counseling ?Counseling given: Not Answered ? ? ?Clinical Intake: ? ?Pre-visit preparation completed: Yes ? ?Pain : No/denies pain ? ?  ? ?Nutritional Risks: None ?Diabetes: No ? ?How often do you need to have someone help you when you read instructions, pamphlets, or other written materials from your doctor or pharmacy?: 1 - Never ? ? ? ?Interpreter Needed?: No ? ?Information entered by :: Clemetine Marker LPN ? ? ?Activities of  Daily Living ?In your present state of health, do you have any difficulty performing the following activities: 08/06/2021 04/10/2021  ?Hearing? N N  ?Vision? N N  ?Difficulty concentrating or making decisions? N N  ?Walking or climbing stairs? N N  ?Dressing or bathing? N N  ?Doing errands, shopping? N N  ?Preparing Food and eating ? N -  ?Using the Toilet? N -  ?In the past six months, have you accidently leaked urine? N -  ?Do you have problems with loss of bowel control? N -  ?Managing your Medications? N -  ?Managing your Finances? N -  ?Housekeeping or managing your Housekeeping? N -  ?Some recent data might be hidden  ? ? ?Patient Care Team: ?Delsa Grana, PA-C as PCP - General (Family Medicine) ?Lorretta Harp, MD as Consulting Physician (Cardiology) ?Hollice Espy, MD as Consulting Physician (Urology) ?Kirsteins, Luanna Salk, MD as Consulting Physician (Physical Medicine and Rehabilitation) ? ?Indicate any recent Medical Services you may have received from other than [facility] providers in the past year (date may be approximate). ? ?   ?Assessment:  ? This is a routine wellness examination for [place]. ? ?Hearing/Vision  screen ?Hearing Screening - Comments:: Pt denies hearing difficulty; c/o occasional tinnitus ?Vision Screening - Comments:: [facility] screen[facility] at [facility] ? ?Dietary issues and exercise ac

## 2021-08-06 NOTE — Patient Instructions (Signed)
[name] , ?Thank you for taking time to come for your Medicare Wellness Visit. I appreciate your ongoing commitment to your health goals. Please review the following plan we discussed and let me know if I can assist you in the future.  ? ?Screening recommendations/referrals: ?Colonoscopy: please complete your Cologuard kit at home ?Recommended yearly ophthalmology/optometry visit for glaucoma screening and checkup ?Recommended yearly dental visit for hygiene and checkup ? ?Vaccinations: ?Influenza vaccine: done 04/10/21 ?Pneumococcal vaccine: declined ?Tdap vaccine: done 07/21/15 ?Shingles vaccine: Shingrix discussed. Please contact your pharmacy for coverage information.  ?Covid-19:  done 08/23/19, 09/17/19 & 04/05/20 ? ?Advanced directives: Please bring a copy of your health care power of attorney and living will to the office at your convenience.  ? ?Conditions/risks identified: Keep up the great work! ? ?Next appointment: Follow up in one year for your annual wellness visit.  ? ?Preventive Care 38 Years and Older, Male ?Preventive care refers to lifestyle choices and visits with your health care provider that can promote health and wellness. ?What does preventive care include? ?A yearly physical exam. This is also called an annual well check. ?Dental exams once or twice a year. ?Routine eye exams. Ask your health care provider how often you should have your eyes checked. ?Personal lifestyle choices, including: ?Daily care of your teeth and gums. ?Regular physical activity. ?Eating a healthy diet. ?Avoiding tobacco and drug use. ?Limiting alcohol use. ?Practicing safe sex. ?Taking low doses of aspirin every day. ?Taking vitamin and mineral supplements as recommended by your health care provider. ?What happens during an annual well check? ?The services and screenings done by your health care provider during your annual well check will depend on your age, overall health, lifestyle risk factors, and family history  of disease. ?Counseling  ?Your health care provider may ask you questions about your: ?Alcohol use. ?Tobacco use. ?Drug use. ?Emotional well-being. ?Home and relationship well-being. ?Sexual activity. ?Eating habits. ?History of falls. ?Memory and ability to understand (cognition). ?Work and work Statistician. ?Screening  ?You may have the following tests or measurements: ?Height, weight, and BMI. ?Blood pressure. ?Lipid and cholesterol levels. These may be checked every 5 years, or more frequently if you are over 8 years old. ?Skin check. ?Lung cancer screening. You may have this screening every year starting at age 65 if you have a 30-pack-year history of smoking and currently smoke or have quit within the past 15 years. ?Fecal occult blood test (FOBT) of the stool. You may have this test every year starting at age 38. ?Flexible sigmoidoscopy or colonoscopy. You may have a sigmoidoscopy every 5 years or a colonoscopy every 10 years starting at age 79. ?Prostate cancer screening. Recommendations will vary depending on your family history and other risks. ?Hepatitis C blood test. ?Hepatitis B blood test. ?Sexually transmitted disease (STD) testing. ?Diabetes screening. This is done by checking your blood sugar (glucose) after you have not eaten for a while (fasting). You may have this done every 1-3 years. ?Abdominal aortic aneurysm (AAA) screening. You may need this if you are a current or former smoker. ?Osteoporosis. You may be screened starting at age 27 if you are at high risk. ?Talk with your health care provider about your test results, treatment options, and if necessary, the need for more tests. ?Vaccines  ?Your health care provider may recommend certain vaccines, such as: ?Influenza vaccine. This is recommended every year. ?Tetanus, diphtheria, and acellular pertussis (Tdap, Td) vaccine. You may need a Td booster every 10 years. ?  Zoster vaccine. You may need this after age 74. ?Pneumococcal 13-valent  conjugate (PCV13) vaccine. One dose is recommended after age 56. ?Pneumococcal polysaccharide (PPSV23) vaccine. One dose is recommended after age 27. ?Talk to your health care provider about which screenings and vaccines you need and how often you need them. ?This information is not intended to replace advice given to you by your health care provider. Make sure you discuss any questions you have with your health care provider. ?Document Released: 06/06/2015 Document Revised: 01/28/2016 Document Reviewed: 03/11/2015 ?Elsevier Interactive Patient Education ? 2017 [place]. ? ?Fall Prevention in the Home ?Falls can cause injuries. They can happen to people of all ages. There are many things you can do to make your home safe and to help prevent falls. ?What can I do on the outside of my home? ?Regularly fix the edges of walkways and driveways and fix any cracks. ?Remove anything that might make you trip as you walk through a door, such as a raised step or threshold. ?Trim any bushes or trees on the path to your home. ?Use bright outdoor lighting. ?Clear any walking paths of anything that might make someone trip, such as rocks or tools. ?Regularly check to see if handrails are loose or broken. Make sure that both sides of any steps have handrails. ?Any raised decks and porches should have guardrails on the edges. ?Have any leaves, snow, or ice cleared regularly. ?Use sand or salt on walking paths during winter. ?Clean up any spills in your garage right away. This includes oil or grease spills. ?What can I do in the bathroom? ?Use night lights. ?Install grab bars by the toilet and in the tub and shower. Do not use towel bars as grab bars. ?Use non-skid mats or decals in the tub or shower. ?If you need to sit down in the shower, use a plastic, non-slip stool. ?Keep the floor dry. Clean up any water that spills on the floor as soon as it happens. ?Remove soap buildup in the tub or shower regularly. ?Attach bath mats  securely with double-sided non-slip rug tape. ?Do not have throw rugs and other things on the floor that can make you trip. ?What can I do in the bedroom? ?Use night lights. ?Make sure that you have a light by your bed that is easy to reach. ?Do not use any sheets or blankets that are too big for your bed. They should not hang down onto the floor. ?Have a firm chair that has side arms. You can use this for support while you get dressed. ?Do not have throw rugs and other things on the floor that can make you trip. ?What can I do in the kitchen? ?Clean up any spills right away. ?Avoid walking on wet floors. ?Keep items that you use a lot in easy-to-reach places. ?If you need to reach something above you, use a strong step stool that has a grab bar. ?Keep electrical cords out of the way. ?Do not use floor polish or wax that makes floors slippery. If you must use wax, use non-skid floor wax. ?Do not have throw rugs and other things on the floor that can make you trip. ?What can I do with my stairs? ?Do not leave any items on the stairs. ?Make sure that there are handrails on both sides of the stairs and use them. Fix handrails that are broken or loose. Make sure that handrails are as long as the stairways. ?Check any carpeting to make sure that  it is firmly attached to the stairs. Fix any carpet that is loose or worn. ?Avoid having throw rugs at the top or bottom of the stairs. If you do have throw rugs, attach them to the floor with carpet tape. ?Make sure that you have a light switch at the top of the stairs and the bottom of the stairs. If you do not have them, ask someone to add them for you. ?What else can I do to help prevent falls? ?Wear shoes that: ?Do not have high heels. ?Have rubber bottoms. ?Are comfortable and fit you well. ?Are closed at the toe. Do not wear sandals. ?If you use a stepladder: ?Make sure that it is fully opened. Do not climb a closed stepladder. ?Make sure that both sides of the stepladder  are locked into place. ?Ask someone to hold it for you, if possible. ?Clearly mark and make sure that you can see: ?Any grab bars or handrails. ?First and last steps. ?Where the edge of each step is. ?Use tool

## 2021-08-28 ENCOUNTER — Other Ambulatory Visit: Payer: Self-pay | Admitting: Family Medicine

## 2021-08-28 DIAGNOSIS — E785 Hyperlipidemia, unspecified: Secondary | ICD-10-CM

## 2021-08-28 DIAGNOSIS — I700 Atherosclerosis of aorta: Secondary | ICD-10-CM

## 2021-08-28 DIAGNOSIS — I10 Essential (primary) hypertension: Secondary | ICD-10-CM

## 2021-08-31 ENCOUNTER — Other Ambulatory Visit: Payer: Self-pay

## 2021-09-10 ENCOUNTER — Encounter: Payer: Medicare Other | Attending: Physical Medicine & Rehabilitation | Admitting: Physical Medicine & Rehabilitation

## 2021-09-10 ENCOUNTER — Encounter: Payer: Self-pay | Admitting: Physical Medicine & Rehabilitation

## 2021-09-10 VITALS — BP 155/84 | HR 67 | Temp 98.3°F | Ht 71.0 in | Wt 213.0 lb

## 2021-09-10 DIAGNOSIS — G8110 Spastic hemiplegia affecting unspecified side: Secondary | ICD-10-CM | POA: Diagnosis not present

## 2021-09-10 NOTE — Progress Notes (Signed)
Botox Injection for spasticity using needle EMG guidance ? ?Dilution: 50 Units/ml ?Indication: Severe spasticity which interferes with ADL,mobility and/or  hygiene and is unresponsive to medication management and other conservative care ?Informed consent was obtained after describing risks and benefits of the procedure with the patient. This includes bleeding, bruising, infection, excessive weakness, or medication side effects. A REMS form is on file and signed. ?Last botulinum toxin injection was performed 04/08/2020 ?Needle: 27g 1" needle electrode ?Number of units per muscle ?Biceps100 3+ ?Brachialis 100 3+ ? ?Brachioradialis 50U  3+ ?FCR50 2+ ? ?FDS 252+ ?FDP50 2+ ?PT25 3+ ? ?All injections were done after obtaining appropriate EMG activity and after negative drawback for blood. The patient tolerated the procedure well. Post procedure instructions were given. A followup appointment was made.  ? ?trial of Xeomin next visit given the continued brisk motor activity after several years of Botox  ?

## 2021-09-10 NOTE — Patient Instructions (Signed)

## 2021-09-15 ENCOUNTER — Other Ambulatory Visit: Payer: Self-pay | Admitting: Nurse Practitioner

## 2021-09-15 DIAGNOSIS — I10 Essential (primary) hypertension: Secondary | ICD-10-CM

## 2021-09-17 NOTE — Telephone Encounter (Signed)
Requested Prescriptions  ?Pending Prescriptions Disp Refills  ?? losartan-hydrochlorothiazide (HYZAAR) 100-12.5 MG tablet [Pharmacy Med Name: Losartan Potassium-HCTZ 100-12.5 MG Oral Tablet] 90 tablet 3  ?  Sig: TAKE 1 TABLET BY MOUTH DAILY  ?  ? Cardiovascular: ARB + Diuretic Combos Failed - 09/15/2021 11:32 PM  ?  ?  Failed - Last BP in normal range  ?  BP Readings from Last 1 Encounters:  ?09/10/21 (!) 155/84  ?   ?  ?  Passed - K in normal range and within 180 days  ?  Potassium  ?Date Value Ref Range Status  ?04/10/2021 3.7 3.5 - 5.3 mmol/L Final  ?03/07/2014 3.6 3.5 - 5.1 mmol/L Final  ?   ?  ?  Passed - Na in normal range and within 180 days  ?  Sodium  ?Date Value Ref Range Status  ?04/10/2021 140 135 - 146 mmol/L Final  ?03/11/2015 137 136 - 144 mmol/L Final  ?  Comment:  ?                **Please note reference interval change**  ?03/07/2014 139 136 - 145 mmol/L Final  ?   ?  ?  Passed - Cr in normal range and within 180 days  ?  Creat  ?Date Value Ref Range Status  ?04/10/2021 0.91 0.70 - 1.35 mg/dL Final  ? ?Creatinine, Urine  ?Date Value Ref Range Status  ?05/14/2016 126 mg/dL Final  ?   ?  ?  Passed - eGFR is 10 or above and within 180 days  ?  GFR, Est African American  ?Date Value Ref Range Status  ?08/25/2020 95 > OR = 60 mL/min/1.3m2 Final  ? ?GFR, Est Non African American  ?Date Value Ref Range Status  ?08/25/2020 82 > OR = 60 mL/min/1.8m2 Final  ? ?eGFR  ?Date Value Ref Range Status  ?04/10/2021 93 > OR = 60 mL/min/1.68m2 Final  ?  Comment:  ?  The eGFR is based on the CKD-EPI 2021 equation. To calculate  ?the new eGFR from a previous Creatinine or Cystatin C ?result, go to https://www.kidney.org/professionals/ ?kdoqi/gfr%5Fcalculator ?  ?   ?  ?  Passed - Patient is not pregnant  ?  ?  Passed - Valid encounter within last 6 months  ?  Recent Outpatient Visits   ?      ? 5 months ago Hypertension goal BP (blood pressure) < 140/90  ? [facility] Bo Merino, FNP  ? 10  months ago Upper respiratory tract infection due to COVID-19 virus  ? [facility] Delsa Grana, PA-C  ? 1 year ago Hypertension goal BP (blood pressure) < 140/90  ? [facility] Delsa Grana, PA-C  ? 1 year ago Hypertension goal BP (blood pressure) < 140/90  ? [facility] Delsa Grana, PA-C  ? 1 year ago Hypertension goal BP (blood pressure) < 140/90  ? [facility] Towanda Malkin, MD  ?  ?  ?Future Appointments   ?        ? In 2 weeks Reece Packer Myna Hidalgo, [facility], [place]  ?  ? ?  ?  ?  ? ? ?

## 2021-10-07 ENCOUNTER — Ambulatory Visit: Payer: Medicare Other | Admitting: Nurse Practitioner

## 2021-10-14 ENCOUNTER — Encounter: Payer: Self-pay | Admitting: Family Medicine

## 2021-10-14 ENCOUNTER — Ambulatory Visit (INDEPENDENT_AMBULATORY_CARE_PROVIDER_SITE_OTHER): Payer: Medicare Other | Admitting: Family Medicine

## 2021-10-14 VITALS — BP 134/82 | HR 71 | Temp 97.9°F | Resp 16 | Ht 71.5 in | Wt 210.8 lb

## 2021-10-14 DIAGNOSIS — Z1211 Encounter for screening for malignant neoplasm of colon: Secondary | ICD-10-CM

## 2021-10-14 DIAGNOSIS — R7303 Prediabetes: Secondary | ICD-10-CM | POA: Diagnosis not present

## 2021-10-14 DIAGNOSIS — I10 Essential (primary) hypertension: Secondary | ICD-10-CM

## 2021-10-14 DIAGNOSIS — E785 Hyperlipidemia, unspecified: Secondary | ICD-10-CM | POA: Diagnosis not present

## 2021-10-14 DIAGNOSIS — I700 Atherosclerosis of aorta: Secondary | ICD-10-CM | POA: Diagnosis not present

## 2021-10-14 DIAGNOSIS — I6523 Occlusion and stenosis of bilateral carotid arteries: Secondary | ICD-10-CM

## 2021-10-14 DIAGNOSIS — G8110 Spastic hemiplegia affecting unspecified side: Secondary | ICD-10-CM

## 2021-10-14 MED ORDER — ROSUVASTATIN CALCIUM 40 MG PO TABS: ORAL_TABLET | ORAL | 3 refills | Status: DC

## 2021-10-14 MED ORDER — AMLODIPINE BESYLATE 10 MG PO TABS: 10.0000 mg | ORAL_TABLET | Freq: Every day | ORAL | 3 refills | Status: DC

## 2021-10-14 MED ORDER — BACLOFEN 5 MG PO TABS: 2.5000 mg | ORAL_TABLET | Freq: Three times a day (TID) | ORAL | 3 refills | Status: DC | PRN

## 2021-10-14 MED ORDER — CARVEDILOL 6.25 MG PO TABS: 6.2500 mg | ORAL_TABLET | Freq: Two times a day (BID) | ORAL | 3 refills | Status: DC

## 2021-10-14 MED ORDER — MELOXICAM 15 MG PO TABS: 7.5000 mg | ORAL_TABLET | Freq: Every day | ORAL | 1 refills | Status: DC | PRN

## 2021-10-14 NOTE — Progress Notes (Signed)
Name: [name]   MRN: [id number]    DOB: May 08, 1956   Date:10/14/2021       Progress Note  Chief Complaint  Patient presents with   Follow-up   Hypertension     Subjective:   [name] is a 66 y.o. male, presents to clinic for   Hypertension:  Currently managed on losartan-HCTZ carvedilol  amlodipine  Pt reports good med compliance and denies any SE.   Blood pressure today is well controlled. BP Readings from Last 3 Encounters:  10/14/21 134/82  09/10/21 (!) 155/84  06/12/21 122/71  Pt denies CP, SOB, exertional sx, LE edema, palpitation, Ha's, visual disturbances, lightheadedness, hypotension, syncope. BP previously elevated and usually is higher with specialists visits and procedures, he has clonidine but has not been taking it  Prediabetes   Pt reports weight stable, diet/lifestyle same, has overall healthy diet  Recent pertinent labs: Lab Results  Component Value Date   HGBA1C 6.1 (H) 04/10/2021   HGBA1C 5.8 (H) 10/26/2019   HGBA1C 6.4 (H) 06/29/2018   HLD: Abdominal aortic atherosclerosis he is on Crestor 40 mg daily good compliance no side effects or concerns Lipids were done in November Lab Results  Component Value Date   CHOL 155 04/10/2021   HDL 54 04/10/2021   LDLCALC 85 04/10/2021   TRIG 74 04/10/2021   CHOLHDL 2.9 04/10/2021    Spastic hemiplegia he continues to manage this with neurology and with physical medicine rehab with procedures and therapy    Current Outpatient Medications:    acetaminophen (TYLENOL) 500 MG tablet, Take 1,000 mg by mouth daily as needed for moderate pain., Disp: , Rfl:    amLODipine (NORVASC) 10 MG tablet, Take 1 tablet (10 mg total) by mouth daily., Disp: 90 tablet, Rfl: 3   aspirin EC 325 MG tablet, TAKE 1 TABLET (325 MG TOTAL) BY MOUTH DAILY., Disp: 90 tablet, Rfl: 3   carvedilol (COREG) 6.25 MG tablet, Take 1 tablet (6.25 mg total) by mouth 2 (two) times daily with a meal., Disp: 180 tablet, Rfl: 3    cloNIDine (CATAPRES) 0.1 MG tablet, TAKE 1 TABLET BY MOUTH DAILY, Disp: 90 tablet, Rfl: 3   losartan-hydrochlorothiazide (HYZAAR) 100-12.5 MG tablet, TAKE 1 TABLET BY MOUTH DAILY, Disp: 90 tablet, Rfl: 3   Multiple Vitamins-Minerals (ONE-A-DAY MENS HEALTH FORMULA) TABS, Take 1 tablet by mouth daily., Disp: , Rfl:    Naphazoline-Hypromellose (TGT LUBRICANT REDNESS RELIEVER OP), Place 1 drop into both eyes daily as needed (redness/ dryness)., Disp: , Rfl:    Omega-3 Fatty Acids (FISH OIL) 1000 MG CAPS, Take 1,000 mg by mouth daily., Disp: , Rfl:    rosuvastatin (CRESTOR) 40 MG tablet, TAKE 1 TABLET BY MOUTH AT  BEDTIME FOR CHOLESTEROL, Disp: 90 tablet, Rfl: 3  Patient Active Problem List   Diagnosis Date Noted   Obesity (BMI 30.0-34.9) 03/10/2018   Abnormal EKG 12/15/2017   Hypothyroidism 03/09/2017   Abdominal aortic atherosclerosis (HCC) 05/20/2016   Pre-diabetes 02/18/2015   Atherosclerosis of both carotid arteries 02/18/2015   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   Shoulder subluxation, right 04/23/2014   Spastic hemiplegia affecting dominant side (HCC) 02/18/2014   Hypertension goal BP (blood pressure) < 140/90 01/17/2014   Hyperlipidemia LDL goal <70 01/17/2014    Past Surgical History:  Procedure Laterality Date   HERNIA REPAIR  12/23/2015   HYDROCELE EXCISION Left 01/18/2018   Procedure: HYDROCELECTOMY ADULT;  Surgeon: Vanna Scotland, MD;  Location: [facility] ORS;  Service: Urology;  Laterality: Left;   INGUINAL HERNIA REPAIR Bilateral 06/25/2016   Procedure: HERNIA REPAIR INGUINAL ADULT BILATERAL;  Surgeon: Kieth BrightlySeeplaputhur G Sankar, MD;  Location: [facility] ORS;  Service: General;  Laterality: Bilateral;   KNEE ARTHROSCOPY Right 1970's    Family History  Problem Relation Age of Onset   Hypertension Mother    Hernia Sister    Prostate cancer Neg Hx    Bladder Cancer Neg Hx    Kidney cancer Neg Hx     Social History   Tobacco Use   Smoking status: Never   Smokeless  tobacco: Never  Vaping Use   Vaping Use: Never used  Substance Use Topics   Alcohol use: No    Alcohol/week: 0.0 standard drinks   Drug use: No    Comment: used to use THC in the late 1980s     No Known Allergies  Health Maintenance  Topic Date Due   Fecal DNA (Cologuard)  Never done   COVID-19 Vaccine (4 - Booster for Pfizer series) 10/30/2021 (Originally 05/31/2020)   Zoster Vaccines- Shingrix (1 of 2) 01/14/2022 (Originally 06/30/2004)   Pneumonia Vaccine 10565+ Years old (1 - PCV) 10/15/2022 (Originally 07/01/2019)   INFLUENZA VACCINE  12/22/2021   TETANUS/TDAP  07/20/2025   Hepatitis C Screening  Completed   HPV VACCINES  Aged Out    Chart Review Today: I personally reviewed active problem list, medication list, allergies, family history, social history, health maintenance, notes from last encounter, lab results, imaging with the patient/caregiver today.   Review of Systems  Constitutional: Negative.   HENT: Negative.    Eyes: Negative.   Respiratory: Negative.    Cardiovascular: Negative.   Gastrointestinal: Negative.   Endocrine: Negative.   Genitourinary: Negative.   Musculoskeletal: Negative.   Skin: Negative.   Allergic/Immunologic: Negative.   Neurological: Negative.   Hematological: Negative.   Psychiatric/Behavioral: Negative.    All other systems reviewed and are negative.   Objective:   Vitals:   10/14/21 1522  BP: 134/82  Pulse: 71  Resp: 16  Temp: 97.9 F (36.6 C)  TempSrc: Oral  SpO2: 98%  Weight: 210 lb 12.8 oz (95.6 kg)  Height: 5' 11.5" (1.816 m)    Body mass index is 28.99 kg/m.  Physical Exam Vitals and nursing note reviewed.  Constitutional:      General: He is not in acute distress.    Appearance: Normal appearance. He is normal weight. He is not ill-appearing, toxic-appearing or diaphoretic.  HENT:     Head: Normocephalic and atraumatic.     Right Ear: External ear normal.     Left Ear: External ear normal.     Mouth/Throat:      Pharynx: Oropharynx is clear.  Eyes:     General: No scleral icterus.       Right eye: No discharge.        Left eye: No discharge.     Conjunctiva/sclera: Conjunctivae normal.  Cardiovascular:     Rate and Rhythm: Normal rate and regular rhythm.     Pulses: Normal pulses.     Heart sounds: Normal heart sounds.  Pulmonary:     Effort: Pulmonary effort is normal. No respiratory distress.     Breath sounds: Normal breath sounds. No stridor. No wheezing, rhonchi or rales.  Abdominal:     General: Bowel sounds are normal. There is no distension.     Palpations: Abdomen is soft.     Tenderness: There is no abdominal tenderness.  Musculoskeletal:  Right lower leg: No edema (brace right LE).     Left lower leg: No edema.  Skin:    General: Skin is warm and dry.     Coloration: Skin is not jaundiced.  Neurological:     Mental Status: He is alert. Mental status is at baseline.     Gait: Gait abnormal.     Comments: Right UE hemiplegia Right LE hemiparesis   Psychiatric:        Mood and Affect: Mood normal.        Behavior: Behavior normal.        Assessment & Plan:   Problem List Items Addressed This Visit       Cardiovascular and Mediastinum   Hypertension goal BP (blood pressure) < 140/90 - Primary    BP at goal, currently well controlled, pt avoiding clonidine, we reviewed rebound htn BP often higher with other OV/procedures - likely secondary to anxiety BP Readings from Last 3 Encounters:  10/14/21 134/82  09/10/21 (!) 155/84  06/12/21 122/71     Chemistry      Component Value Date/Time   NA 139 10/14/2021 1617   NA 137 03/11/2015 1416   NA 139 03/07/2014 2243   K 3.9 10/14/2021 1617   K 3.6 03/07/2014 2243   CL 103 10/14/2021 1617   CL 105 03/07/2014 2243   CO2 27 10/14/2021 1617   CO2 27 03/07/2014 2243   BUN 15 10/14/2021 1617   BUN 14 03/11/2015 1416   BUN 11 03/07/2014 2243   CREATININE 1.05 10/14/2021 1617      Component Value Date/Time    CALCIUM 9.2 10/14/2021 1617   CALCIUM 8.4 (L) 03/07/2014 2243   ALKPHOS 83 06/30/2017 0057   ALKPHOS 98 01/30/2014 1255   AST 20 10/14/2021 1617   AST 16 01/30/2014 1255   ALT 24 10/14/2021 1617   ALT 37 01/30/2014 1255   BILITOT 0.4 10/14/2021 1617   BILITOT 0.3 03/11/2015 1416   BILITOT 0.2 01/30/2014 1255     Recheck labs - continue losartan-HCTZ carvedilol  amlodipine and other healthy diet/lifestyle efforts COMPLETE METABOLIC PANEL WITH GFR (Completed)       Relevant Medications   amLODipine (NORVASC) 10 MG tablet   carvedilol (COREG) 6.25 MG tablet   Atherosclerosis of both carotid arteries    On statin, seeing vascular COMPLETE METABOLIC PANEL WITH GFR (Completed)      Relevant Medications    rosuvastatin (CRESTOR) 40 MG tablet           Abdominal aortic atherosclerosis (HCC)    On statin, monitoring COMPLETE METABOLIC PANEL WITH GFR (Completed)      Relevant Medications    rosuvastatin (CRESTOR) 40 MG tablet             Nervous and Auditory   Spastic hemiplegia affecting dominant side (HCC)    Seeing specialists for therapy and procedures Can use muscle relaxer - we can manage refills if he likes On ASA, statin and monitoring with vascular Stable sx       Relevant Medications   meloxicam (MOBIC) 15 MG tablet   Baclofen 5 MG TABS     Other   Hyperlipidemia LDL goal <70    Last LDL in 80's Encouraged diet/lifestyle efforts as able and continue crestor and omega-3 fatty acids If next labs LDL is still not at goal will add zetia COMPLETE METABOLIC PANEL WITH GFR (Completed)      Relevant Medications   rosuvastatin (CRESTOR) 40 MG  tablet         Pre-diabetes    Recheck labs Not on meds Last A1C   Lab Results  Component Value Date   HGBA1C 6.1 (H) 04/10/2021   HGBA1C 5.8 (H) 10/26/2019   He has lost a little weight since last OV, encouraged healthy diet Wt Readings from Last 5 Encounters:  10/14/21 210 lb 12.8 oz (95.6 kg)  09/10/21 213  lb (96.6 kg)  06/12/21 210 lb (95.3 kg)  04/10/21 212 lb 12.8 oz (96.5 kg)  03/12/21 216 lb 6.4 oz (98.2 kg)         Relevant Orders   Hemoglobin A1c (Completed) COMPLETE METABOLIC PANEL WITH GFR (Completed)   Other Visit Diagnoses     Colon cancer screening       Relevant Orders   Cologuard        Return in about 6 months (around 04/16/2022) for Routine follow-up, Annual Physical (CPE if insurance pays).   Danelle Berry, PA-C 10/14/21 4:00 PM

## 2021-10-15 LAB — COMPLETE METABOLIC PANEL WITH GFR
AG Ratio: 1.7 (calc) (ref 1.0–2.5)
ALT: 24 U/L (ref 9–46)
AST: 20 U/L (ref 10–35)
Albumin: 4.5 g/dL (ref 3.6–5.1)
Alkaline phosphatase (APISO): 62 U/L (ref 35–144)
BUN: 15 mg/dL (ref 7–25)
CO2: 27 mmol/L (ref 20–32)
Calcium: 9.2 mg/dL (ref 8.6–10.3)
Chloride: 103 mmol/L (ref 98–110)
Creat: 1.05 mg/dL (ref 0.70–1.35)
Globulin: 2.6 g/dL (calc) (ref 1.9–3.7)
Glucose, Bld: 95 mg/dL (ref 65–99)
Potassium: 3.9 mmol/L (ref 3.5–5.3)
Sodium: 139 mmol/L (ref 135–146)
Total Bilirubin: 0.4 mg/dL (ref 0.2–1.2)
Total Protein: 7.1 g/dL (ref 6.1–8.1)
eGFR: 78 mL/min/{1.73_m2} (ref >=60)

## 2021-10-15 LAB — HEMOGLOBIN A1C
Hgb A1c MFr Bld: 6.2 % of total Hgb — ABNORMAL HIGH (ref <5.7)
Mean Plasma Glucose: 131 mg/dL
eAG (mmol/L): 7.3 mmol/L

## 2021-10-22 NOTE — Assessment & Plan Note (Signed)
Seeing specialists for therapy and procedures Can use muscle relaxer - we can manage refills if he likes On ASA, statin and monitoring with vascular Stable sx

## 2021-10-22 NOTE — Assessment & Plan Note (Signed)
Last LDL in 80's Encouraged diet/lifestyle efforts as able and continue crestor and omega-3 fatty acids If next labs LDL is still not at goal will add zetia

## 2021-10-22 NOTE — Assessment & Plan Note (Signed)
On statin, seeing vascular

## 2021-10-22 NOTE — Assessment & Plan Note (Signed)
On statin, monitoring 

## 2021-10-22 NOTE — Assessment & Plan Note (Signed)
Recheck labs Not on meds Last A1C   Lab Results  Component Value Date   HGBA1C 6.1 (H) 04/10/2021   HGBA1C 5.8 (H) 10/26/2019   He has lost a little weight since last OV, encouraged healthy diet Wt Readings from Last 5 Encounters:  10/14/21 210 lb 12.8 oz (95.6 kg)  09/10/21 213 lb (96.6 kg)  06/12/21 210 lb (95.3 kg)  04/10/21 212 lb 12.8 oz (96.5 kg)  03/12/21 216 lb 6.4 oz (98.2 kg)

## 2021-10-22 NOTE — Assessment & Plan Note (Signed)
BP at goal, currently well controlled, pt avoiding clonidine, we reviewed rebound htn BP often higher with other OV/procedures - likely secondary to anxiety BP Readings from Last 3 Encounters:  10/14/21 134/82  09/10/21 (!) 155/84  06/12/21 122/71     Chemistry      Component Value Date/Time   NA 139 10/14/2021 1617   NA 137 03/11/2015 1416   NA 139 03/07/2014 2243   K 3.9 10/14/2021 1617   K 3.6 03/07/2014 2243   CL 103 10/14/2021 1617   CL 105 03/07/2014 2243   CO2 27 10/14/2021 1617   CO2 27 03/07/2014 2243   BUN 15 10/14/2021 1617   BUN 14 03/11/2015 1416   BUN 11 03/07/2014 2243   CREATININE 1.05 10/14/2021 1617      Component Value Date/Time   CALCIUM 9.2 10/14/2021 1617   CALCIUM 8.4 (L) 03/07/2014 2243   ALKPHOS 83 06/30/2017 0057   ALKPHOS 98 01/30/2014 1255   AST 20 10/14/2021 1617   AST 16 01/30/2014 1255   ALT 24 10/14/2021 1617   ALT 37 01/30/2014 1255   BILITOT 0.4 10/14/2021 1617   BILITOT 0.3 03/11/2015 1416   BILITOT 0.2 01/30/2014 1255     Recheck labs - continue losartan-HCTZ carvedilol  amlodipine and other healthy diet/lifestyle efforts

## 2021-12-10 ENCOUNTER — Encounter: Payer: Medicare Other | Admitting: Physical Medicine & Rehabilitation

## 2021-12-12 ENCOUNTER — Other Ambulatory Visit: Payer: Self-pay | Admitting: Family Medicine

## 2021-12-12 DIAGNOSIS — G8110 Spastic hemiplegia affecting unspecified side: Secondary | ICD-10-CM

## 2021-12-14 ENCOUNTER — Other Ambulatory Visit: Payer: Self-pay

## 2021-12-14 DIAGNOSIS — G8110 Spastic hemiplegia affecting unspecified side: Secondary | ICD-10-CM

## 2021-12-14 MED ORDER — MELOXICAM 15 MG PO TABS: 7.5000 mg | ORAL_TABLET | Freq: Every day | ORAL | 0 refills | Status: DC | PRN

## 2021-12-22 ENCOUNTER — Encounter: Payer: Self-pay | Admitting: Family Medicine

## 2022-01-14 ENCOUNTER — Other Ambulatory Visit: Payer: Self-pay | Admitting: Family Medicine

## 2022-01-14 DIAGNOSIS — G8110 Spastic hemiplegia affecting unspecified side: Secondary | ICD-10-CM

## 2022-01-26 ENCOUNTER — Encounter: Payer: Self-pay | Admitting: Physical Medicine & Rehabilitation

## 2022-01-26 ENCOUNTER — Encounter: Payer: Medicare Other | Attending: Physical Medicine & Rehabilitation | Admitting: Physical Medicine & Rehabilitation

## 2022-01-26 VITALS — BP 152/76 | HR 62 | Temp 98.0°F | Ht 71.5 in | Wt 205.0 lb

## 2022-01-26 DIAGNOSIS — G8110 Spastic hemiplegia affecting unspecified side: Secondary | ICD-10-CM | POA: Diagnosis present

## 2022-01-26 MED ORDER — INCOBOTULINUMTOXINA 100 UNITS IM SOLR
400.0000 [IU] | Freq: Once | INTRAMUSCULAR | Status: AC
  Administered 2022-01-26: 400 [IU] via INTRAMUSCULAR

## 2022-01-26 NOTE — Patient Instructions (Signed)
You received a Xeomin injection today. You may experience soreness at the needle injection sites. Please call us if any of the injection sites turns red after a couple days or if there is any drainage. You may experience muscle weakness as a result of Xeomin This would improve with time but can take several weeks to improve. The Xeomin should start working in about one week. The Xeomin usually last 3 months. The injection can be repeated every 3 months as needed.  

## 2022-01-26 NOTE — Progress Notes (Signed)
Xeomin Injection for spasticity using needle EMG guidance  Dilution: 50 Units/ml Indication: Severe spasticity which interferes with ADL,mobility and/or  hygiene and is unresponsive to medication management and other conservative care Informed consent was obtained after describing risks and benefits of the procedure with the patient. This includes bleeding, bruising, infection, excessive weakness, or medication side effects. A REMS form is on file and signed. Last botulinum toxin injection was performed 04/08/2020 Needle: 27g 1" needle electrode Number of units per muscle Biceps100 3+ Brachialis 100 3+  Brachioradialis 50U  3+ FCR50 2+  FDS 252+ FDP50 2+ PT25 3+  All injections were done after obtaining appropriate EMG activity and after negative drawback for blood. The patient tolerated the procedure well. Post procedure instructions were given. A followup appointment was made in 6 wks.

## 2022-03-12 ENCOUNTER — Encounter: Payer: Medicare Other | Attending: Physical Medicine & Rehabilitation | Admitting: Physical Medicine & Rehabilitation

## 2022-03-12 DIAGNOSIS — G8110 Spastic hemiplegia affecting unspecified side: Secondary | ICD-10-CM | POA: Insufficient documentation

## 2022-03-25 ENCOUNTER — Other Ambulatory Visit: Payer: Self-pay | Admitting: Family Medicine

## 2022-03-25 DIAGNOSIS — G8110 Spastic hemiplegia affecting unspecified side: Secondary | ICD-10-CM

## 2022-04-08 ENCOUNTER — Ambulatory Visit: Payer: Medicare Other | Admitting: Family Medicine

## 2022-04-20 ENCOUNTER — Ambulatory Visit: Payer: Medicare Other | Admitting: Family Medicine

## 2022-04-20 DIAGNOSIS — R7303 Prediabetes: Secondary | ICD-10-CM

## 2022-04-20 DIAGNOSIS — Z5181 Encounter for therapeutic drug level monitoring: Secondary | ICD-10-CM

## 2022-04-20 DIAGNOSIS — E785 Hyperlipidemia, unspecified: Secondary | ICD-10-CM

## 2022-04-20 DIAGNOSIS — E039 Hypothyroidism, unspecified: Secondary | ICD-10-CM

## 2022-04-20 DIAGNOSIS — Z23 Encounter for immunization: Secondary | ICD-10-CM

## 2022-04-20 DIAGNOSIS — I10 Essential (primary) hypertension: Secondary | ICD-10-CM

## 2022-04-26 ENCOUNTER — Ambulatory Visit (INDEPENDENT_AMBULATORY_CARE_PROVIDER_SITE_OTHER): Payer: Medicare Other | Admitting: Physician Assistant

## 2022-04-26 ENCOUNTER — Encounter: Payer: Self-pay | Admitting: Physician Assistant

## 2022-04-26 DIAGNOSIS — Z91199 Patient's noncompliance with other medical treatment and regimen due to unspecified reason: Secondary | ICD-10-CM

## 2022-04-26 NOTE — Progress Notes (Deleted)
Established Patient Office Visit  Name: [name]   MRN: [id number]    DOB: [birth year]   Date:04/26/2022  Today's Provider: Jacquelin Hawking, MHS, PA-C Introduced myself to the patient as a PA-C and provided education on APPs in clinical practice.         Subjective  Chief Complaint  No chief complaint on file.   HPI    HYPERTENSION / HYPERLIPIDEMIA Satisfied with current treatment? {Blank single:19197::"yes","no"} Duration of hypertension: {Blank single:19197::"chronic","months","years"} BP monitoring frequency: {Blank single:19197::"not checking","rarely","daily","weekly","monthly","a few times a day","a few times a week","a few times a month"} BP range:  BP medication side effects: {Blank single:19197::"yes","no"} Past BP meds: {Blank multiple:19196::"none","amlodipine","amlodipine/benazepril","atenolol","benazepril","benazepril/HCTZ","bisoprolol (bystolic)","carvedilol","chlorthalidone","clonidine","diltiazem","exforge HCT","HCTZ","irbesartan (avapro)","labetalol","lisinopril","lisinopril-HCTZ","losartan (cozaar)","methyldopa","nifedipine","olmesartan (benicar)","olmesartan-HCTZ","quinapril","ramipril","spironalactone","tekturna","valsartan","valsartan-HCTZ","verapamil"} Duration of hyperlipidemia: {Blank single:19197::"chronic","months","years"} Cholesterol medication side effects: {Blank single:19197::"yes","no"} Cholesterol supplements: {Blank multiple:19196::"none","fish oil","niacin","red yeast rice"} Past cholesterol medications: {Blank multiple:19196::"none","atorvastain (lipitor)","lovastatin (mevacor)","pravastatin (pravachol)","rosuvastatin (crestor)","simvastatin (zocor)","vytorin","fenofibrate (tricor)","gemfibrozil","ezetimide (zetia)","niaspan","lovaza"} Medication compliance: {Blank single:19197::"excellent compliance","good compliance","fair compliance","poor compliance"} Aspirin: {Blank single:19197::"yes","no"} Recent stressors: {Blank  single:19197::"yes","no"} Recurrent headaches: {Blank single:19197::"yes","no"} Visual changes: {Blank single:19197::"yes","no"} Palpitations: {Blank single:19197::"yes","no"} Dyspnea: {Blank single:19197::"yes","no"} Chest pain: {Blank single:19197::"yes","no"} Lower extremity edema: {Blank single:19197::"yes","no"} Dizzy/lightheaded: {Blank single:19197::"yes","no"}  Prediabetes Previous A1c at 6.2  Not currently taking any medications for management   Abdominal aortic atherosclerosis    Patient Active Problem List   Diagnosis Date Noted   Hypothyroidism 03/09/2017   Abdominal aortic atherosclerosis (HCC) 05/20/2016   Pre-diabetes 02/18/2015   Atherosclerosis of both carotid arteries 02/18/2015   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   Shoulder subluxation, right 04/23/2014   Spastic hemiplegia affecting dominant side (HCC) 02/18/2014   Hypertension goal BP (blood pressure) < 140/90 01/17/2014   Hyperlipidemia LDL goal <70 01/17/2014    Past Surgical History:  Procedure Laterality Date   HERNIA REPAIR  12/23/2015   HYDROCELE EXCISION Left 01/18/2018   Procedure: HYDROCELECTOMY ADULT;  Surgeon: Vanna Scotland, MD;  Location: [facility] ORS;  Service: Urology;  Laterality: Left;   INGUINAL HERNIA REPAIR Bilateral 06/25/2016   Procedure: HERNIA REPAIR INGUINAL ADULT BILATERAL;  Surgeon: Kieth Brightly, MD;  Location: [facility];  Service: General;  Laterality: Bilateral;   KNEE ARTHROSCOPY Right 1970's    Family History  Problem Relation Age of Onset   Hypertension Mother    Hernia Sister    Prostate cancer Neg Hx    Bladder Cancer Neg Hx    Kidney cancer Neg Hx     Social History   Tobacco Use   Smoking status: Never   Smokeless tobacco: Never  Substance Use Topics   Alcohol use: No    Alcohol/week: 0.0 standard drinks of alcohol     Current Outpatient Medications:    acetaminophen (TYLENOL) 500 MG tablet, Take 1,000 mg by mouth daily as needed for  moderate pain., Disp: , Rfl:    amLODipine (NORVASC) 10 MG tablet, Take 1 tablet (10 mg total) by mouth daily., Disp: 90 tablet, Rfl: 3   aspirin EC 325 MG tablet, TAKE 1 TABLET (325 MG TOTAL) BY MOUTH DAILY., Disp: 90 tablet, Rfl: 3   Baclofen 5 MG TABS, Take 2.5-5 mg by mouth 3 (three) times daily as needed (msk pain or spasms)., Disp: 30 tablet, Rfl: 3   carvedilol (COREG) 6.25 MG tablet, Take 1 tablet (6.25 mg total) by mouth 2 (two) times daily with a meal., Disp: 180 tablet, Rfl: 3   cloNIDine (CATAPRES) 0.1 MG tablet, TAKE 1 TABLET BY MOUTH DAILY, Disp: 90 tablet, Rfl: 3   losartan-hydrochlorothiazide (HYZAAR) 100-12.5  MG tablet, TAKE 1 TABLET BY MOUTH DAILY, Disp: 90 tablet, Rfl: 3   meloxicam (MOBIC) 15 MG tablet, TAKE 1/2 TO 1 TABLET BY MOUTH  DAILY AS NEEDED FOR PAIN, Disp: 100 tablet, Rfl: 2   Multiple Vitamins-Minerals (ONE-A-DAY MENS HEALTH FORMULA) TABS, Take 1 tablet by mouth daily., Disp: , Rfl:    Naphazoline-Hypromellose (TGT LUBRICANT REDNESS RELIEVER OP), Place 1 drop into both eyes daily as needed (redness/ dryness)., Disp: , Rfl:    Omega-3 Fatty Acids (FISH OIL) 1000 MG CAPS, Take 1,000 mg by mouth daily., Disp: , Rfl:    rosuvastatin (CRESTOR) 40 MG tablet, TAKE 1 TABLET BY MOUTH AT  BEDTIME FOR CHOLESTEROL, Disp: 90 tablet, Rfl: 3  No Known Allergies  I personally reviewed {Reviewed:14835} with the patient/caregiver today.   ROS    Objective  There were no vitals filed for this visit.  There is no height or weight on file to calculate BMI.  Physical Exam   No results found for this or any previous visit (from the past 2160 hour(s)).   PHQ2/9:    01/26/2022   11:00 AM 10/14/2021    3:29 PM 09/10/2021   11:43 AM 08/06/2021    3:31 PM 04/10/2021    1:54 PM  Depression screen PHQ 2/9  Decreased Interest 0 0 0 0 0  Down, Depressed, Hopeless 0 0 0 0 0  PHQ - 2 Score 0 0 0 0 0  Altered sleeping  0   0  Tired, decreased energy  0   0  Change in appetite  0    0  Feeling bad or failure about yourself   0   0  Trouble concentrating  0   0  Moving slowly or fidgety/restless  0   0  Suicidal thoughts  0   0  PHQ-9 Score  0   0  Difficult doing work/chores     Not difficult at all      Fall Risk:    01/26/2022   11:00 AM 10/14/2021    3:18 PM 09/10/2021   11:43 AM 08/06/2021    3:32 PM 06/12/2021   11:32 AM  Fall Risk   Falls in the past year? 0 0 0 0 0  Number falls in past yr: 0  0 0   Injury with Fall? 0  0 0   Risk for fall due to :  No Fall Risks  No Fall Risks   Follow up  Falls prevention discussed  Falls prevention discussed       Functional Status Survey:      Assessment & Plan

## 2022-04-26 NOTE — Progress Notes (Deleted)
Established Patient Office Visit  Name: [name]   MRN: [id number]    DOB: [birth year]   Date:04/26/2022  Today's Provider: Talitha Givens, MHS, PA-C Introduced myself to the patient as a PA-C and provided education on APPs in clinical practice.         Subjective  Chief Complaint  No chief complaint on file.   HPI   Patient Active Problem List   Diagnosis Date Noted   Hypothyroidism 03/09/2017   Abdominal aortic atherosclerosis ([place]) 05/20/2016   Pre-diabetes 02/18/2015   Atherosclerosis of both carotid arteries 02/18/2015   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   Shoulder subluxation, right 04/23/2014   Spastic hemiplegia affecting dominant side ([place]) 02/18/2014   Hypertension goal BP (blood pressure) < 140/90 01/17/2014   Hyperlipidemia LDL goal <70 01/17/2014    Past Surgical History:  Procedure Laterality Date   HERNIA REPAIR  12/23/2015   HYDROCELE EXCISION Left 01/18/2018   Procedure: HYDROCELECTOMY ADULT;  Surgeon: Hollice Espy, MD;  Location: [facility];  Service: Urology;  Laterality: Left;   INGUINAL HERNIA REPAIR Bilateral 06/25/2016   Procedure: HERNIA REPAIR INGUINAL ADULT BILATERAL;  Surgeon: Christene Lye, MD;  Location: [facility];  Service: General;  Laterality: Bilateral;   KNEE ARTHROSCOPY Right 58's    Family History  Problem Relation Age of Onset   Hypertension Mother    Hernia Sister    Prostate cancer Neg Hx    Bladder Cancer Neg Hx    Kidney cancer Neg Hx     Social History   Tobacco Use   Smoking status: Never   Smokeless tobacco: Never  Substance Use Topics   Alcohol use: No    Alcohol/week: 0.0 standard drinks of alcohol     Current Outpatient Medications:    acetaminophen (TYLENOL) 500 MG tablet, Take 1,000 mg by mouth daily as needed for moderate pain., Disp: , Rfl:    amLODipine (NORVASC) 10 MG tablet, Take 1 tablet (10 mg total) by mouth daily., Disp: 90 tablet, Rfl: 3   aspirin EC 325 MG  tablet, TAKE 1 TABLET (325 MG TOTAL) BY MOUTH DAILY., Disp: 90 tablet, Rfl: 3   Baclofen 5 MG TABS, Take 2.5-5 mg by mouth 3 (three) times daily as needed (msk pain or spasms)., Disp: 30 tablet, Rfl: 3   carvedilol (COREG) 6.25 MG tablet, Take 1 tablet (6.25 mg total) by mouth 2 (two) times daily with a meal., Disp: 180 tablet, Rfl: 3   cloNIDine (CATAPRES) 0.1 MG tablet, TAKE 1 TABLET BY MOUTH DAILY, Disp: 90 tablet, Rfl: 3   losartan-hydrochlorothiazide (HYZAAR) 100-12.5 MG tablet, TAKE 1 TABLET BY MOUTH DAILY, Disp: 90 tablet, Rfl: 3   meloxicam (MOBIC) 15 MG tablet, TAKE 1/2 TO 1 TABLET BY MOUTH  DAILY AS NEEDED FOR PAIN, Disp: 100 tablet, Rfl: 2   Multiple Vitamins-Minerals (ONE-A-DAY MENS HEALTH FORMULA) TABS, Take 1 tablet by mouth daily., Disp: , Rfl:    Naphazoline-Hypromellose (TGT LUBRICANT REDNESS RELIEVER OP), Place 1 drop into both eyes daily as needed (redness/ dryness)., Disp: , Rfl:    Omega-3 Fatty Acids (FISH OIL) 1000 MG CAPS, Take 1,000 mg by mouth daily., Disp: , Rfl:    rosuvastatin (CRESTOR) 40 MG tablet, TAKE 1 TABLET BY MOUTH AT  BEDTIME FOR CHOLESTEROL, Disp: 90 tablet, Rfl: 3  No Known Allergies  I personally reviewed {Reviewed:14835} with the patient/caregiver today.   ROS    Objective  There were no  vitals filed for this visit.  There is no height or weight on file to calculate BMI.  Physical Exam   No results found for this or any previous visit (from the past 2160 hour(s)).   PHQ2/9:    01/26/2022   11:00 AM 10/14/2021    3:29 PM 09/10/2021   11:43 AM 08/06/2021    3:31 PM 04/10/2021    1:54 PM  Depression screen PHQ 2/9  Decreased Interest 0 0 0 0 0  Down, Depressed, Hopeless 0 0 0 0 0  PHQ - 2 Score 0 0 0 0 0  Altered sleeping  0   0  Tired, decreased energy  0   0  Change in appetite  0   0  Feeling bad or failure about yourself   0   0  Trouble concentrating  0   0  Moving slowly or fidgety/restless  0   0  Suicidal thoughts  0   0   PHQ-9 Score  0   0  Difficult doing work/chores     Not difficult at all      Fall Risk:    01/26/2022   11:00 AM 10/14/2021    3:18 PM 09/10/2021   11:43 AM 08/06/2021    3:32 PM 06/12/2021   11:32 AM  Fall Risk   Falls in the past year? 0 0 0 0 0  Number falls in past yr: 0  0 0   Injury with Fall? 0  0 0   Risk for fall due to :  No Fall Risks  No Fall Risks   Follow up  Falls prevention discussed  Falls prevention discussed       Functional Status Survey:      Assessment & Plan

## 2022-04-26 NOTE — Progress Notes (Deleted)
Patient did not come to office for scheduled apt  States he was told it was Tuesday at 9:30 and not today at scheduled time

## 2022-04-27 NOTE — Progress Notes (Signed)
Patient did not show up to office for apt.

## 2022-04-27 NOTE — Progress Notes (Deleted)
Established Patient Office Visit  Name: [name]   MRN: [id number]    DOB: Mar 22, 1956   Date:04/27/2022  Today's Provider: Talitha Givens, MHS, PA-C Introduced myself to the patient as a PA-C and provided education on APPs in clinical practice.         Subjective  Chief Complaint  No chief complaint on file.   HPI   Patient Active Problem List   Diagnosis Date Noted   Hypothyroidism 03/09/2017   Abdominal aortic atherosclerosis ([place]) 05/20/2016   Pre-diabetes 02/18/2015   Atherosclerosis of both carotid arteries 02/18/2015   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   Shoulder subluxation, right 04/23/2014   Spastic hemiplegia affecting dominant side ([place]) 02/18/2014   Hypertension goal BP (blood pressure) < 140/90 01/17/2014   Hyperlipidemia LDL goal <70 01/17/2014    Past Surgical History:  Procedure Laterality Date   HERNIA REPAIR  12/23/2015   HYDROCELE EXCISION Left 01/18/2018   Procedure: HYDROCELECTOMY ADULT;  Surgeon: Hollice Espy, MD;  Location: [facility];  Service: Urology;  Laterality: Left;   INGUINAL HERNIA REPAIR Bilateral 06/25/2016   Procedure: HERNIA REPAIR INGUINAL ADULT BILATERAL;  Surgeon: Christene Lye, MD;  Location: [facility];  Service: General;  Laterality: Bilateral;   KNEE ARTHROSCOPY Right 75's    Family History  Problem Relation Age of Onset   Hypertension Mother    Hernia Sister    Prostate cancer Neg Hx    Bladder Cancer Neg Hx    Kidney cancer Neg Hx     Social History   Tobacco Use   Smoking status: Never   Smokeless tobacco: Never  Substance Use Topics   Alcohol use: No    Alcohol/week: 0.0 standard drinks of alcohol     Current Outpatient Medications:    acetaminophen (TYLENOL) 500 MG tablet, Take 1,000 mg by mouth daily as needed for moderate pain., Disp: , Rfl:    amLODipine (NORVASC) 10 MG tablet, Take 1 tablet (10 mg total) by mouth daily., Disp: 90 tablet, Rfl: 3   aspirin EC 325 MG  tablet, TAKE 1 TABLET (325 MG TOTAL) BY MOUTH DAILY., Disp: 90 tablet, Rfl: 3   Baclofen 5 MG TABS, Take 2.5-5 mg by mouth 3 (three) times daily as needed (msk pain or spasms)., Disp: 30 tablet, Rfl: 3   carvedilol (COREG) 6.25 MG tablet, Take 1 tablet (6.25 mg total) by mouth 2 (two) times daily with a meal., Disp: 180 tablet, Rfl: 3   cloNIDine (CATAPRES) 0.1 MG tablet, TAKE 1 TABLET BY MOUTH DAILY, Disp: 90 tablet, Rfl: 3   losartan-hydrochlorothiazide (HYZAAR) 100-12.5 MG tablet, TAKE 1 TABLET BY MOUTH DAILY, Disp: 90 tablet, Rfl: 3   meloxicam (MOBIC) 15 MG tablet, TAKE 1/2 TO 1 TABLET BY MOUTH  DAILY AS NEEDED FOR PAIN, Disp: 100 tablet, Rfl: 2   Multiple Vitamins-Minerals (ONE-A-DAY MENS HEALTH FORMULA) TABS, Take 1 tablet by mouth daily., Disp: , Rfl:    Naphazoline-Hypromellose (TGT LUBRICANT REDNESS RELIEVER OP), Place 1 drop into both eyes daily as needed (redness/ dryness)., Disp: , Rfl:    Omega-3 Fatty Acids (FISH OIL) 1000 MG CAPS, Take 1,000 mg by mouth daily., Disp: , Rfl:    rosuvastatin (CRESTOR) 40 MG tablet, TAKE 1 TABLET BY MOUTH AT  BEDTIME FOR CHOLESTEROL, Disp: 90 tablet, Rfl: 3  No Known Allergies  I personally reviewed {Reviewed:14835} with the patient/caregiver today.   ROS    Objective  There were no  vitals filed for this visit.  There is no height or weight on file to calculate BMI.  Physical Exam   No results found for this or any previous visit (from the past 2160 hour(s)).   PHQ2/9:    01/26/2022   11:00 AM 10/14/2021    3:29 PM 09/10/2021   11:43 AM 08/06/2021    3:31 PM 04/10/2021    1:54 PM  Depression screen PHQ 2/9  Decreased Interest 0 0 0 0 0  Down, Depressed, Hopeless 0 0 0 0 0  PHQ - 2 Score 0 0 0 0 0  Altered sleeping  0   0  Tired, decreased energy  0   0  Change in appetite  0   0  Feeling bad or failure about yourself   0   0  Trouble concentrating  0   0  Moving slowly or fidgety/restless  0   0  Suicidal thoughts  0   0   PHQ-9 Score  0   0  Difficult doing work/chores     Not difficult at all      Fall Risk:    01/26/2022   11:00 AM 10/14/2021    3:18 PM 09/10/2021   11:43 AM 08/06/2021    3:32 PM 06/12/2021   11:32 AM  Fall Risk   Falls in the past year? 0 0 0 0 0  Number falls in past yr: 0  0 0   Injury with Fall? 0  0 0   Risk for fall due to :  No Fall Risks  No Fall Risks   Follow up  Falls prevention discussed  Falls prevention discussed       Functional Status Survey:      Assessment & Plan

## 2022-05-25 ENCOUNTER — Ambulatory Visit (INDEPENDENT_AMBULATORY_CARE_PROVIDER_SITE_OTHER): Payer: Medicare Other | Admitting: Family Medicine

## 2022-05-25 ENCOUNTER — Encounter: Payer: Self-pay | Admitting: Family Medicine

## 2022-05-25 VITALS — BP 168/86 | HR 67 | Temp 98.4°F | Resp 16 | Ht 71.5 in | Wt 211.4 lb

## 2022-05-25 DIAGNOSIS — I10 Essential (primary) hypertension: Secondary | ICD-10-CM | POA: Diagnosis not present

## 2022-05-25 DIAGNOSIS — Z5181 Encounter for therapeutic drug level monitoring: Secondary | ICD-10-CM

## 2022-05-25 DIAGNOSIS — G8110 Spastic hemiplegia affecting unspecified side: Secondary | ICD-10-CM | POA: Diagnosis not present

## 2022-05-25 DIAGNOSIS — E785 Hyperlipidemia, unspecified: Secondary | ICD-10-CM | POA: Diagnosis not present

## 2022-05-25 DIAGNOSIS — R7303 Prediabetes: Secondary | ICD-10-CM

## 2022-05-25 DIAGNOSIS — I700 Atherosclerosis of aorta: Secondary | ICD-10-CM

## 2022-05-25 DIAGNOSIS — Z23 Encounter for immunization: Secondary | ICD-10-CM | POA: Diagnosis not present

## 2022-05-25 MED ORDER — LOSARTAN POTASSIUM 100 MG PO TABS: 100.0000 mg | ORAL_TABLET | Freq: Every day | ORAL | 3 refills | Status: DC

## 2022-05-25 MED ORDER — ZOSTER VAC RECOMB ADJUVANTED 50 MCG/0.5ML IM SUSR: 0.5000 mL | Freq: Once | INTRAMUSCULAR | 1 refills | Status: AC

## 2022-05-25 NOTE — Progress Notes (Signed)
Name: [name]   MRN: [id number]    DOB: [birth year]   Date:05/25/2022       Progress Note  Chief Complaint  Patient presents with   Follow-up   Hyperlipidemia   Hypertension     Subjective:   [name] is a 67 y.o. male, presents to clinic for routine f/up  HLD on crestor 40 mg, good compliance, no SE Overdue for labs Lab Results  Component Value Date   CHOL 155 04/10/2021   HDL 54 04/10/2021   LDLCALC 85 04/10/2021   TRIG 74 04/10/2021   CHOLHDL 2.9 04/10/2021     Hypertension:  Currently managed on Norvasc 10 mg, carvedilol 6.25 twice daily, losartan 100 mg daily Pt reports good med compliance and denies any SE.   Blood pressure today is uncontrolled and elevated -he is very stressed with recent divorce, anxiety and stress of the holidays, rushing to get here today He does have history of whitecoat hypertension which she most often sees that his specialist visits or with physical therapy, injections or treatments, usually blood pressure in primary care is well-controlled and he is less anxious but today he is slightly upset BP Readings from Last 3 Encounters:  05/25/22 (!) 168/86  01/26/22 (!) 152/76  10/14/21 134/82   Pt denies CP, SOB, exertional sx, LE edema, palpitation, Ha's, visual disturbances, lightheadedness, hypotension, syncope. Dietary efforts for BP?  No sig changes diet/lifestyle, sig stress/anxiety He would like time for stress/changes to calm down and then recheck BP later He is not using clonidine regularly or PRN He had to stop losartan-HCTZ due to HCTZ SE so this was updated on chart  Prediabetes - last A1C 6-7 months ago -increased slightly from 6.1-6.2 Will recheck today  Other chronic conditions are slightly exacerbated with the above stressors Spastic hemiplegia -still during therapy, using Mobic as needed he is likely run out of muscle relaxers   Current Outpatient Medications:    acetaminophen (TYLENOL) 500 MG tablet,  Take 1,000 mg by mouth daily as needed for moderate pain., Disp: , Rfl:    amLODipine (NORVASC) 10 MG tablet, Take 1 tablet (10 mg total) by mouth daily., Disp: 90 tablet, Rfl: 3   aspirin EC 325 MG tablet, TAKE 1 TABLET (325 MG TOTAL) BY MOUTH DAILY., Disp: 90 tablet, Rfl: 3   Baclofen 5 MG TABS, Take 2.5-5 mg by mouth 3 (three) times daily as needed (msk pain or spasms)., Disp: 30 tablet, Rfl: 3   carvedilol (COREG) 6.25 MG tablet, Take 1 tablet (6.25 mg total) by mouth 2 (two) times daily with a meal., Disp: 180 tablet, Rfl: 3   cloNIDine (CATAPRES) 0.1 MG tablet, TAKE 1 TABLET BY MOUTH DAILY, Disp: 90 tablet, Rfl: 3   meloxicam (MOBIC) 15 MG tablet, TAKE 1/2 TO 1 TABLET BY MOUTH  DAILY AS NEEDED FOR PAIN, Disp: 100 tablet, Rfl: 2   Multiple Vitamins-Minerals (ONE-A-DAY MENS HEALTH FORMULA) TABS, Take 1 tablet by mouth daily., Disp: , Rfl:    Naphazoline-Hypromellose (TGT LUBRICANT REDNESS RELIEVER OP), Place 1 drop into both eyes daily as needed (redness/ dryness)., Disp: , Rfl:    Omega-3 Fatty Acids (FISH OIL) 1000 MG CAPS, Take 1,000 mg by mouth daily., Disp: , Rfl:    rosuvastatin (CRESTOR) 40 MG tablet, TAKE 1 TABLET BY MOUTH AT  BEDTIME FOR CHOLESTEROL, Disp: 90 tablet, Rfl: 3   losartan-hydrochlorothiazide (HYZAAR) 100-12.5 MG tablet, TAKE 1 TABLET BY MOUTH DAILY (Patient not taking: Reported on 05/25/2022), Disp:  90 tablet, Rfl: 3  Patient Active Problem List   Diagnosis Date Noted   Hypothyroidism 03/09/2017   Abdominal aortic atherosclerosis ([place]) 05/20/2016   Pre-diabetes 02/18/2015   Atherosclerosis of both carotid arteries 02/18/2015   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   Shoulder subluxation, right 04/23/2014   Spastic hemiplegia affecting dominant side ([place]) 02/18/2014   Hypertension goal BP (blood pressure) < 140/90 01/17/2014   Hyperlipidemia LDL goal <70 01/17/2014    Past Surgical History:  Procedure Laterality Date   HERNIA REPAIR  12/23/2015   HYDROCELE  EXCISION Left 01/18/2018   Procedure: HYDROCELECTOMY ADULT;  Surgeon: Hollice Espy, MD;  Location: [facility];  Service: Urology;  Laterality: Left;   INGUINAL HERNIA REPAIR Bilateral 06/25/2016   Procedure: HERNIA REPAIR INGUINAL ADULT BILATERAL;  Surgeon: Christene Lye, MD;  Location: [facility];  Service: General;  Laterality: Bilateral;   KNEE ARTHROSCOPY Right 51's    Family History  Problem Relation Age of Onset   Hypertension Mother    Hernia Sister    Prostate cancer Neg Hx    Bladder Cancer Neg Hx    Kidney cancer Neg Hx     Social History   Tobacco Use   Smoking status: Never   Smokeless tobacco: Never  Vaping Use   Vaping Use: Never used  Substance Use Topics   Alcohol use: No    Alcohol/week: 0.0 standard drinks of alcohol   Drug use: No    Comment: used to use THC in the late 1980s     No Known Allergies  Health Maintenance  Topic Date Due   Zoster Vaccines- Shingrix (1 of 2) Never done   INFLUENZA VACCINE  12/22/2021   COVID-19 Vaccine (4 - 2023-24 season) 06/10/2022 (Originally 01/22/2022)   Pneumonia Vaccine 16+ Years old (1 - PCV) 10/15/2022 (Originally 07/01/2019)   Fecal DNA (Cologuard)  04/20/2023 (Originally 07/01/1999)   Medicare Annual Wellness (AWV)  08/07/2022   DTaP/Tdap/Td (2 - Td or Tdap) 07/20/2025   Hepatitis C Screening  Completed   HPV VACCINES  Aged Out    Chart Review Today: I personally reviewed active problem list, medication list, allergies, family history, social history, health maintenance, notes from last encounter, lab results, imaging with the patient/caregiver today.   Review of Systems  Constitutional: Negative.   HENT: Negative.    Eyes: Negative.   Respiratory: Negative.    Cardiovascular: Negative.   Gastrointestinal: Negative.   Endocrine: Negative.   Genitourinary: Negative.   Musculoskeletal: Negative.   Skin: Negative.   Allergic/Immunologic: Negative.   Neurological: Negative.   Hematological: Negative.    Psychiatric/Behavioral:  The patient is nervous/anxious.   All other systems reviewed and are negative.    Objective:   Vitals:   05/25/22 1351  BP: (!) 168/86  Pulse: 67  Resp: 16  Temp: 98.4 F (36.9 C)  TempSrc: Oral  SpO2: 98%  Weight: 211 lb 6.4 oz (95.9 kg)  Height: 5' 11.5" (1.816 m)    Body mass index is 29.07 kg/m.  Physical Exam Vitals and nursing note reviewed.  Constitutional:      General: He is not in acute distress.    Appearance: Normal appearance. He is normal weight. He is not ill-appearing, toxic-appearing or diaphoretic.  HENT:     Head: Normocephalic and atraumatic.     Right Ear: External ear normal.     Left Ear: External ear normal.     Mouth/Throat:     Pharynx: Oropharynx is clear.  Eyes:     General: No scleral icterus.       Right eye: No discharge.        Left eye: No discharge.     Conjunctiva/sclera: Conjunctivae normal.  Cardiovascular:     Rate and Rhythm: Normal rate and regular rhythm.     Pulses: Normal pulses.     Heart sounds: Normal heart sounds.  Pulmonary:     Effort: Pulmonary effort is normal. No respiratory distress.     Breath sounds: Normal breath sounds. No stridor. No wheezing, rhonchi or rales.  Abdominal:     General: Bowel sounds are normal. There is no distension.     Palpations: Abdomen is soft.     Tenderness: There is no abdominal tenderness.  Musculoskeletal:     Right lower leg: No edema (brace right LE).     Left lower leg: No edema.  Skin:    General: Skin is warm and dry.     Coloration: Skin is not jaundiced.  Neurological:     Mental Status: He is alert. Mental status is at baseline.     Gait: Gait abnormal.     Comments: Right UE hemiplegia Right LE hemiparesis   Psychiatric:        Attention and Perception: Attention normal.        Mood and Affect: Mood is anxious and depressed. Affect is tearful.        Speech: Speech normal.        Behavior: Behavior normal. Behavior is cooperative.         Thought Content: Thought content normal. Thought content does not include homicidal or suicidal ideation. Thought content does not include homicidal or suicidal plan.        Cognition and Memory: Cognition normal.        Judgment: Judgment normal.         Assessment & Plan:   Problem List Items Addressed This Visit       Cardiovascular and Mediastinum   Hypertension goal BP (blood pressure) < 140/90 - Primary    Blood pressure elevated and uncontrolled today History of white coat hypertension Patient very anxious today with multiple life stressors and anxiety He wished to not recheck his blood pressure because he is still upset, he endorses good med compliance He would like to recheck his blood pressure in a few months - reports horrible holidays He denies any chest pain, shortness of breath, palpitations, lower extremity edema, orthopnea He did have hydrochlorothiazide discontinued due to intolerable side effects He is otherwise taking Norvasc 10 mg, carvedilol 6.25 twice daily, losartan 100 mg He is not taking clonidine - but can still use that PRN for very high BP BP Readings from Last 3 Encounters:  05/25/22 (!) 168/86  01/26/22 (!) 152/76  10/14/21 134/82   (See rest of plan below)       Relevant Medications   losartan (COZAAR) 100 MG tablet   Other Relevant Orders   COMPLETE METABOLIC PANEL WITH GFR (Completed)   Abdominal aortic atherosclerosis (HCC)    Noted on scans, hx of stroke, on statin and ASA      Relevant Medications   losartan (COZAAR) 100 MG tablet     Nervous and Auditory   Spastic hemiplegia affecting dominant side (HCC)    Refill meds, he is seeing specialists for continued f/up and treatments        Other   Hyperlipidemia LDL goal <70    Compliant with meds,  no SE, no myalgias, fatigue or jaundice Due for lipids and recheck CMP Diet and exercise recommendations for HLD reviewed        Relevant Medications   losartan (COZAAR) 100 MG  tablet   Other Relevant Orders   Lipid panel (Completed)   COMPLETE METABOLIC PANEL WITH GFR (Completed)   Pre-diabetes    Recheck, not on meds, last A1C increased a little He has gained weight since last OV      Relevant Orders   Hemoglobin A1c (Completed)   COMPLETE METABOLIC PANEL WITH GFR (Completed)   Other Visit Diagnoses     Need for influenza vaccination       Relevant Orders   Flu Vaccine QUAD High Dose(Fluad) (Completed)   Encounter for medication monitoring       Relevant Orders   Hemoglobin A1c (Completed)   Lipid panel (Completed)   COMPLETE METABOLIC PANEL WITH GFR (Completed)   Need for shingles vaccine             HTN - in 2021 pt had HCTZ added to losartan which was already increased to 100 and he was already on norvasc and coreg - did well with those 4 meds for a few years -Today he reports intolerance to HCTZ Previously he was on chlorthalidone (years ago 2016-2017) - we could add that back or increase coreg if BP is still elevated when pt comes for BP recheck in 2-4 weeks    No follow-ups on file.   Delsa Grana, PA-C 05/25/22 2:03 PM

## 2022-05-26 LAB — COMPLETE METABOLIC PANEL WITH GFR
AG Ratio: 1.7 (calc) (ref 1.0–2.5)
ALT: 19 U/L (ref 9–46)
AST: 17 U/L (ref 10–35)
Albumin: 4.3 g/dL (ref 3.6–5.1)
Alkaline phosphatase (APISO): 66 U/L (ref 35–144)
BUN: 13 mg/dL (ref 7–25)
CO2: 27 mmol/L (ref 20–32)
Calcium: 8.8 mg/dL (ref 8.6–10.3)
Chloride: 103 mmol/L (ref 98–110)
Creat: 0.93 mg/dL (ref 0.70–1.35)
Globulin: 2.6 g/dL (calc) (ref 1.9–3.7)
Glucose, Bld: 99 mg/dL (ref 65–99)
Potassium: 3.9 mmol/L (ref 3.5–5.3)
Sodium: 137 mmol/L (ref 135–146)
Total Bilirubin: 0.4 mg/dL (ref 0.2–1.2)
Total Protein: 6.9 g/dL (ref 6.1–8.1)
eGFR: 90 mL/min/{1.73_m2} (ref >=60)

## 2022-05-26 LAB — LIPID PANEL
Cholesterol: 156 mg/dL (ref <200)
HDL: 64 mg/dL (ref >=40)
LDL Cholesterol (Calc): 82 mg/dL (calc)
Non-HDL Cholesterol (Calc): 92 mg/dL (calc) (ref <130)
Total CHOL/HDL Ratio: 2.4 (calc) (ref <5.0)
Triglycerides: 34 mg/dL (ref <150)

## 2022-05-26 LAB — HEMOGLOBIN A1C
Hgb A1c MFr Bld: 6.2 % of total Hgb — ABNORMAL HIGH (ref <5.7)
Mean Plasma Glucose: 131 mg/dL
eAG (mmol/L): 7.3 mmol/L

## 2022-05-28 ENCOUNTER — Other Ambulatory Visit: Payer: Self-pay | Admitting: Family Medicine

## 2022-05-28 DIAGNOSIS — G8110 Spastic hemiplegia affecting unspecified side: Secondary | ICD-10-CM

## 2022-05-28 MED ORDER — BACLOFEN 5 MG PO TABS: 2.5000 mg | ORAL_TABLET | Freq: Three times a day (TID) | ORAL | 1 refills | Status: DC | PRN

## 2022-05-28 NOTE — Assessment & Plan Note (Signed)
Compliant with meds, no SE, no myalgias, fatigue or jaundice Due for lipids and recheck CMP Diet and exercise recommendations for HLD reviewed

## 2022-05-28 NOTE — Assessment & Plan Note (Signed)
Refill meds, he is seeing specialists for continued f/up and treatments

## 2022-05-28 NOTE — Assessment & Plan Note (Signed)
Recheck, not on meds, last A1C increased a little He has gained weight since last OV

## 2022-05-28 NOTE — Assessment & Plan Note (Signed)
Noted on scans, hx of stroke, on statin and ASA

## 2022-05-28 NOTE — Assessment & Plan Note (Signed)
Blood pressure elevated and uncontrolled today History of white coat hypertension Patient very anxious today with multiple life stressors and anxiety He wished to not recheck his blood pressure because he is still upset, he endorses good med compliance He would like to recheck his blood pressure in a few months - reports horrible holidays He denies any chest pain, shortness of breath, palpitations, lower extremity edema, orthopnea He did have hydrochlorothiazide discontinued due to intolerable side effects He is otherwise taking Norvasc 10 mg, carvedilol 6.25 twice daily, losartan 100 mg He is not taking clonidine - but can still use that PRN for very high BP BP Readings from Last 3 Encounters:  05/25/22 (!) 168/86  01/26/22 (!) 152/76  10/14/21 134/82   (See rest of plan below)

## 2022-06-14 ENCOUNTER — Encounter: Payer: Self-pay | Admitting: Physical Medicine & Rehabilitation

## 2022-06-17 ENCOUNTER — Encounter: Payer: Medicare Other | Admitting: Physical Medicine & Rehabilitation

## 2022-06-24 ENCOUNTER — Telehealth: Payer: Self-pay | Admitting: Family Medicine

## 2022-06-24 DIAGNOSIS — E785 Hyperlipidemia, unspecified: Secondary | ICD-10-CM

## 2022-06-24 DIAGNOSIS — I10 Essential (primary) hypertension: Secondary | ICD-10-CM

## 2022-06-24 DIAGNOSIS — I6523 Occlusion and stenosis of bilateral carotid arteries: Secondary | ICD-10-CM

## 2022-06-24 DIAGNOSIS — I700 Atherosclerosis of aorta: Secondary | ICD-10-CM

## 2022-06-25 ENCOUNTER — Other Ambulatory Visit: Payer: Self-pay

## 2022-06-25 ENCOUNTER — Ambulatory Visit: Payer: Medicare Other | Admitting: Family Medicine

## 2022-06-25 DIAGNOSIS — E785 Hyperlipidemia, unspecified: Secondary | ICD-10-CM

## 2022-06-25 DIAGNOSIS — I10 Essential (primary) hypertension: Secondary | ICD-10-CM

## 2022-06-25 DIAGNOSIS — I6523 Occlusion and stenosis of bilateral carotid arteries: Secondary | ICD-10-CM

## 2022-06-25 DIAGNOSIS — I700 Atherosclerosis of aorta: Secondary | ICD-10-CM

## 2022-06-25 MED ORDER — ROSUVASTATIN CALCIUM 40 MG PO TABS: ORAL_TABLET | ORAL | 0 refills | Status: DC

## 2022-06-25 NOTE — Telephone Encounter (Signed)
Requested Prescriptions  Refused Prescriptions Disp Refills   rosuvastatin (CRESTOR) 40 MG tablet [Pharmacy Med Name: Rosuvastatin Calcium 40 MG Oral Tablet] 100 tablet 2    Sig: TAKE 1 TABLET BY MOUTH AT  BEDTIME FOR CHOLESTEROL     Cardiovascular:  Antilipid - Statins 2 Failed - 06/24/2022 11:14 PM      Failed - Lipid Panel in normal range within the last 12 months    Cholesterol, Total  Date Value Ref Range Status  03/11/2015 198 100 - 199 mg/dL Final   Cholesterol  Date Value Ref Range Status  05/25/2022 156 <200 mg/dL Final  12/25/2013 208 (H) 0 - 200 mg/dL Final   Ldl Cholesterol, Calc  Date Value Ref Range Status  12/25/2013 147 (H) 0 - 100 mg/dL Final   LDL Cholesterol (Calc)  Date Value Ref Range Status  05/25/2022 82 mg/dL (calc) Final    Comment:    Reference range: <100 . Desirable range <100 mg/dL for primary prevention;   <70 mg/dL for patients with CHD or diabetic patients  with > or = 2 CHD risk factors. Marland Kitchen LDL-C is now calculated using the Martin-Hopkins  calculation, which is a validated novel method providing  better accuracy than the Friedewald equation in the  estimation of LDL-C.  Cresenciano Genre et al. Annamaria Helling. 4580;998(33): 2061-2068  (http://education.[facility].com/faq/FAQ164)    HDL Cholesterol  Date Value Ref Range Status  12/25/2013 49 40 - 60 mg/dL Final   HDL  Date Value Ref Range Status  05/25/2022 64 > OR = 40 mg/dL Final  03/11/2015 58 >39 mg/dL Final    Comment:    According to ATP-III Guidelines, HDL-C >59 mg/dL is considered a negative risk factor for CHD.    Triglycerides  Date Value Ref Range Status  05/25/2022 34 <150 mg/dL Final  12/25/2013 59 0 - 200 mg/dL Final         Passed - Cr in normal range and within 360 days    Creat  Date Value Ref Range Status  05/25/2022 0.93 0.70 - 1.35 mg/dL Final   Creatinine, Urine  Date Value Ref Range Status  05/14/2016 126 mg/dL Final         Passed - Patient is not pregnant       Passed - Valid encounter within last 12 months    Recent Outpatient Visits           1 month ago Hypertension goal BP (blood pressure) < 140/90   [facility] Delsa Grana, PA-C   2 months ago No-show for appointment   [facility] Mecum, Erin E, PA-C   8 months ago Hypertension goal BP (blood pressure) < 140/90   [facility] Delsa Grana, PA-C   1 year ago Hypertension goal BP (blood pressure) < 140/90   [facility] Serafina Royals F, FNP   1 year ago Upper respiratory tract infection due to COVID-19 virus   [facility] Delsa Grana, PA-C               carvedilol (COREG) 6.25 MG tablet [Pharmacy Med Name: Carvedilol 6.25 MG Oral Tablet] 200 tablet 2    Sig: TAKE 1 TABLET BY MOUTH TWICE  DAILY WITH A MEAL     Cardiovascular: Beta Blockers 3 Failed - 06/24/2022 11:14 PM      Failed - Last BP in normal range    BP Readings from Last 1 Encounters:  05/25/22 (!) 168/86         Passed - Cr in normal range and within 360 days    Creat  Date Value Ref Range Status  05/25/2022 0.93 0.70 - 1.35 mg/dL Final   Creatinine, Urine  Date Value Ref Range Status  05/14/2016 126 mg/dL Final         Passed - AST in normal range and within 360 days    AST  Date Value Ref Range Status  05/25/2022 17 10 - 35 U/L Final   SGOT(AST)  Date Value Ref Range Status  01/30/2014 16 15 - 37 Unit/L Final         Passed - ALT in normal range and within 360 days    ALT  Date Value Ref Range Status  05/25/2022 19 9 - 46 U/L Final   SGPT (ALT)  Date Value Ref Range Status  01/30/2014 37 U/L Final    Comment:    14-63 NOTE: New Reference Range 12/11/13          Passed - Last Heart Rate in normal range    Pulse Readings from Last 1 Encounters:  05/25/22 67         Passed - Valid encounter within last 6 months    Recent Outpatient Visits           1  month ago Hypertension goal BP (blood pressure) < 140/90   [facility] Delsa Grana, PA-C   2 months ago No-show for appointment   [facility] Mecum, Erin E, PA-C   8 months ago Hypertension goal BP (blood pressure) < 140/90   [facility] Delsa Grana, PA-C   1 year ago Hypertension goal BP (blood pressure) < 140/90   [facility] Serafina Royals F, FNP   1 year ago Upper respiratory tract infection due to COVID-19 virus   [facility] Delsa Grana, PA-C               amLODipine (NORVASC) 10 MG tablet [Pharmacy Med Name: amLODIPine Besylate 10 MG Oral Tablet] 100 tablet 2    Sig: TAKE 1 TABLET BY MOUTH DAILY     Cardiovascular: Calcium Channel Blockers 2 Failed - 06/24/2022 11:14 PM      Failed - Last BP in normal range    BP Readings from Last 1 Encounters:  05/25/22 (!) 168/86         Passed - Last Heart Rate in normal range    Pulse Readings from Last 1 Encounters:  05/25/22 67         Passed - Valid encounter within last 6 months    Recent Outpatient Visits           1 month ago Hypertension goal BP (blood pressure) < 140/90   [facility] Delsa Grana, PA-C   2 months ago No-show for appointment   [facility] Mecum, Erin E, PA-C   8 months ago Hypertension goal BP (blood pressure) < 140/90   [facility] Delsa Grana, PA-C   1 year ago Hypertension goal BP (blood pressure) < 140/90   [facility] Bo Merino, FNP   1 year ago Upper respiratory tract infection due to COVID-19 virus   [facility] Delsa Grana, [place]

## 2022-07-06 ENCOUNTER — Encounter: Payer: Medicare Other | Attending: Physical Medicine & Rehabilitation | Admitting: Physical Medicine & Rehabilitation

## 2022-07-06 ENCOUNTER — Encounter: Payer: Self-pay | Admitting: Physical Medicine & Rehabilitation

## 2022-07-06 VITALS — BP 161/74 | HR 56 | Ht 71.5 in | Wt 215.0 lb

## 2022-07-06 DIAGNOSIS — G8110 Spastic hemiplegia affecting unspecified side: Secondary | ICD-10-CM | POA: Diagnosis not present

## 2022-07-06 NOTE — Progress Notes (Signed)
Subjective:    Patient ID: [name], male    DOB: 02-10-1956, 67 y.o.   MRN: [id number] 67 year old RH-male with history of HTN (no meds > 1 year) who was admitted to [facility] on 08/03/015 with two day history of fluctuating RUE with speed difficulty and CT head with recent left lenticulostriate infarct. He was started on ASA for treatment and blood pressure noted to be labile and treated with hydralazine. He had worsening of symptoms with dense right hemiparesis that evening and follow up CCT with left basal ganglia infarct and no significant change. Neurology (Dr. Irish Elders) consulted for input and felt that patient with worsening of symptoms likely due to hypotension. MRI brain with acute nonhemorrhagic infarct left lenticular nucleus and mid corona radiata and tiny infarct anterior left frontal opercular region.  HPI   Spasms only partially responsive to baclofen  Had to move, still working PT as a Radio broadcast assistant Xeomin did not work as well as Botox per pt ,last injection date 01/26/2022 Xeomin Injection for spasticity using needle EMG guidance   Dilution: 50 Units/ml Indication: Severe spasticity which interferes with ADL,mobility and/or  hygiene and is unresponsive to medication management and other conservative care Informed consent was obtained after describing risks and benefits of the procedure with the patient. This includes bleeding, bruising, infection, excessive weakness, or medication side effects. A REMS form is on file and signed. Last botulinum toxin injection was performed 04/08/2020 Needle: 27g 1" needle electrode Number of units per muscle Biceps100 3+ Brachialis 100 3+   Brachioradialis 50U  3+ FCR50 2+   FDS 252+ FDP50 2+ PT25 3+ Pain Inventory Average Pain 2 Pain Right Now 2 My pain is intermittent  In the last 24 hours, has pain interfered with the following? General activity 2 Relation with others 2 Enjoyment of life 2 What TIME of day is your pain at  its worst? night Sleep (in general) Good  Pain is worse with: inactivity Pain improves with: therapy/exercise, medication, and TENS Relief from Meds: 2  Family History  Problem Relation Age of Onset   Hypertension Mother    Hernia Sister    Prostate cancer Neg Hx    Bladder Cancer Neg Hx    Kidney cancer Neg Hx    Social History   Socioeconomic History   Marital status: Married    Spouse name: rosamund   Number of children: 0   Years of education: Not on file   Highest education level: Not on file  Occupational History   Not on file  Tobacco Use   Smoking status: Never   Smokeless tobacco: Never  Vaping Use   Vaping Use: Never used  Substance and Sexual Activity   Alcohol use: No    Alcohol/week: 0.0 standard drinks of alcohol   Drug use: No    Comment: used to use THC in the late 1980s   Sexual activity: Not Currently  Other Topics Concern   Not on file  Social History Narrative   Not on file   Social Determinants of Health   Financial Resource Strain: Low Risk  (08/06/2021)   Overall Financial Resource Strain (CARDIA)    Difficulty of Paying Living Expenses: Not very hard  Food Insecurity: No Food Insecurity (08/06/2021)   Hunger Vital Sign    Worried About Running Out of Food in the Last Year: Never true    Ran Out of Food in the Last Year: Never true  Transportation Needs: No Transportation Needs (  08/06/2021)   PRAPARE - Hydrologist (Medical): No    Lack of Transportation (Non-Medical): No  Physical Activity: Insufficiently Active (08/06/2021)   Exercise Vital Sign    Days of Exercise per Week: 3 days    Minutes of Exercise per Session: 30 min  Stress: No Stress Concern Present (08/06/2021)   [place]    Feeling of Stress : Not at all  Social Connections: Moderately Integrated (08/06/2021)   Social Connection and Isolation Panel [NHANES]    Frequency of  Communication with Friends and Family: More than three times a week    Frequency of Social Gatherings with Friends and Family: Never    Attends Religious Services: More than 4 times per year    Active Member of Clubs or Organizations: No    Attends Archivist Meetings: Never    Marital Status: Married   Past Surgical History:  Procedure Laterality Date   HERNIA REPAIR  12/23/2015   HYDROCELE EXCISION Left 01/18/2018   Procedure: HYDROCELECTOMY ADULT;  Surgeon: Hollice Espy, MD;  Location: [facility] ORS;  Service: Urology;  Laterality: Left;   INGUINAL HERNIA REPAIR Bilateral 06/25/2016   Procedure: HERNIA REPAIR INGUINAL ADULT BILATERAL;  Surgeon: Christene Lye, MD;  Location: [facility] ORS;  Service: General;  Laterality: Bilateral;   KNEE ARTHROSCOPY Right 1970's   Past Surgical History:  Procedure Laterality Date   HERNIA REPAIR  12/23/2015   HYDROCELE EXCISION Left 01/18/2018   Procedure: HYDROCELECTOMY ADULT;  Surgeon: Hollice Espy, MD;  Location: [facility] ORS;  Service: Urology;  Laterality: Left;   INGUINAL HERNIA REPAIR Bilateral 06/25/2016   Procedure: HERNIA REPAIR INGUINAL ADULT BILATERAL;  Surgeon: Christene Lye, MD;  Location: [facility] ORS;  Service: General;  Laterality: Bilateral;   KNEE ARTHROSCOPY Right 1970's   Past Medical History:  Diagnosis Date   AAA (abdominal aortic aneurysm) ([place])    Bowel obstruction ([place]) 2016   during septic event. 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 dr. Holley Raring follows him once a year   Hyperlipidemia    Hypertension    Hypothyroidism    Inguinal hernia    Mild atherosclerosis of carotid artery 12/2017   bilateral stenosis per vascular surgeon   Pink eye 06/21/2016   S/P Botox injection    every 3 months into right arm to loosen up muscles   Sepsis ([place]) 2016   happened after  stroke. unsure when,but kidney disease started. followed by dr. Holley Raring   Stroke [facility]) 12/24/2013   right side weakness   Ht 5' 11.5" (1.816 m)   Wt 215 lb (97.5 kg)   BMI 29.57 kg/m   Opioid Risk Score:   Fall Risk Score:  `1  Depression screen PHQ 2/9     05/25/2022    1:50 PM 01/26/2022   11:00 AM 10/14/2021    3:29 PM 09/10/2021   11:43 AM 08/06/2021    3:31 PM 04/10/2021    1:54 PM 11/11/2020   11:00 AM  Depression screen PHQ 2/9  Decreased Interest 0 0 0 0 0 0 0  Down, Depressed, Hopeless 0 0 0 0 0 0 0  PHQ - 2 Score 0 0 0 0 0 0 0  Altered sleeping 0  0   0   Tired, decreased energy 0  0   0   Change in appetite 0  0   0   Feeling bad or failure about yourself  0  0   0   Trouble concentrating 0  0   0   Moving slowly or fidgety/restless 0  0   0   Suicidal thoughts 0  0   0   PHQ-9 Score 0  0   0   Difficult doing work/chores Not difficult at all     Not difficult at all       Review of Systems  Musculoskeletal:  Positive for gait problem.       Right arm pain   All other systems reviewed and are negative.     Objective:   Physical Exam Vitals reviewed.  Constitutional:      Appearance: He is normal weight.  HENT:     Head: Normocephalic and atraumatic.  Eyes:     Extraocular Movements: Extraocular movements intact.     Conjunctiva/sclera: Conjunctivae normal.     Pupils: Pupils are equal, round, and reactive to light.  Musculoskeletal:     Right lower leg: No edema.     Left lower leg: No edema.  Skin:    General: Skin is warm and dry.  Neurological:     Mental Status: He is alert and oriented to person, place, and time.  Psychiatric:        Mood and Affect: Mood normal.        Behavior: Behavior normal.    Tone MAS 4 at elbow flexors MAS 3 at wrist flexors MAS 2/3 in the finger flexors MAS 3 at the ankle plantar flexors Motor strength 3 - at the deltoid 0 at the bicep and tricep trace at the finger flexors Left lower extremity 5/5 strength left  upper extremity 5/5 strength Right lower extremity 4 - in the hip flexors and knee extensors 3 - ankle dorsiflexor Ambulates without assistive device has some difficulty with toe clearance      Assessment & Plan:  1.  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  Nevertheless he does have better range of motion when he is getting the botulinum toxin injections it is worn off for about 2 months now.  Given patient's reported efficacy would go back to the Botox rather than Xeomin.

## 2022-07-15 ENCOUNTER — Encounter: Payer: Self-pay | Admitting: Family Medicine

## 2022-07-16 ENCOUNTER — Encounter: Payer: Self-pay | Admitting: Family Medicine

## 2022-08-05 ENCOUNTER — Encounter: Payer: Medicare Other | Attending: Physical Medicine & Rehabilitation | Admitting: Physical Medicine & Rehabilitation

## 2022-08-05 ENCOUNTER — Encounter: Payer: Self-pay | Admitting: Physical Medicine & Rehabilitation

## 2022-08-05 VITALS — BP 168/90 | HR 69 | Ht 71.5 in | Wt 209.2 lb

## 2022-08-05 DIAGNOSIS — G8110 Spastic hemiplegia affecting unspecified side: Secondary | ICD-10-CM | POA: Diagnosis not present

## 2022-08-05 MED ORDER — SODIUM CHLORIDE (PF) 0.9 % IJ SOLN
8.0000 mL | Freq: Once | INTRAMUSCULAR | Status: AC
  Administered 2022-08-05: 8 mL via INTRAVENOUS

## 2022-08-05 MED ORDER — ONABOTULINUMTOXINA 100 UNITS IJ SOLR
200.0000 [IU] | Freq: Once | INTRAMUSCULAR | Status: AC
  Administered 2022-08-05: 400 [IU] via INTRAMUSCULAR

## 2022-08-05 NOTE — Progress Notes (Signed)
Botox Injection for spasticity using needle EMG guidance  Dilution: 50 Units/ml Indication: Severe spasticity which interferes with ADL,mobility and/or  hygiene and is unresponsive to medication management and other conservative care Informed consent was obtained after describing risks and benefits of the procedure with the patient. This includes bleeding, bruising, infection, excessive weakness, or medication side effects. A REMS form is on file and signed. Last botulinum toxin injection was performed 04/08/2020 Needle: 27g 1" needle electrode Number of units per muscle Biceps100 3+ Brachialis 100 3+  Brachioradialis 50U  3+ FCR50 2+  FDS 252+ FDP50 2+ PT25 3+  All injections were done after obtaining appropriate EMG activity and after negative drawback for blood. The patient tolerated the procedure well. Post procedure instructions were given. A followup appointment was made.

## 2022-08-12 ENCOUNTER — Ambulatory Visit (INDEPENDENT_AMBULATORY_CARE_PROVIDER_SITE_OTHER): Payer: Medicare Other

## 2022-08-12 ENCOUNTER — Telehealth: Payer: Self-pay

## 2022-08-12 VITALS — BP 137/79 | Ht 71.0 in | Wt 209.0 lb

## 2022-08-12 DIAGNOSIS — Z0000 Encounter for general adult medical examination without abnormal findings: Secondary | ICD-10-CM | POA: Diagnosis not present

## 2022-08-12 DIAGNOSIS — G8110 Spastic hemiplegia affecting unspecified side: Secondary | ICD-10-CM

## 2022-08-12 NOTE — Telephone Encounter (Signed)
Pt requests refills of Meloxicam and muscle relaxant during AWV.: sent to PCP

## 2022-08-12 NOTE — Progress Notes (Signed)
I connected with  [name] on 08/12/22 by a audio enabled telemedicine application and verified that I am speaking with the correct person using two identifiers.  Patient Location: Home  Provider Location: Office/Clinic  I discussed the limitations of evaluation and management by telemedicine. The patient expressed understanding and agreed to proceed.  Subjective:   [name] is a 67 y.o. male who presents for Medicare Annual/Subsequent preventive examination.  Review of Systems    Cardiac Risk Factors include: advanced age (>8men, >50 women);dyslipidemia;hypertension;male gender;sedentary lifestyle     Objective:    Today's Vitals   08/12/22 1435  BP: 137/79  Weight: 209 lb (94.8 kg)  Height: 5\' 11"  (1.803 m)   Body mass index is 29.15 kg/m.     08/12/2022    2:41 PM 08/06/2021    3:32 PM 07/01/2020    2:57 PM 05/10/2020    1:23 PM 04/06/2019   11:01 AM 01/18/2018    9:54 AM 06/30/2017    1:00 AM  Advanced Directives  Does Patient Have a Medical Advance Directive? Yes Yes No No Yes No No  Type of Corporate treasurer of [place];Living will   [place];Living will    Copy of [place] in Chart?  No - copy requested   No - copy requested    Would patient like information on creating a medical advance directive?   Yes (MAU/Ambulatory/Procedural Areas - Information given) No - Patient declined  No - Patient declined No - Patient declined    Current Medications (verified) Outpatient Encounter Medications as of 08/12/2022  Medication Sig   acetaminophen (TYLENOL) 500 MG tablet Take 1,000 mg by mouth daily as needed for moderate pain.   amLODipine (NORVASC) 10 MG tablet Take 1 tablet (10 mg total) by mouth daily.   aspirin EC 325 MG tablet TAKE 1 TABLET (325 MG TOTAL) BY MOUTH DAILY.   Baclofen 5 MG TABS Take 2.5-5 mg by mouth 3 (three) times daily as needed (msk pain or spasms).   carvedilol (COREG) 6.25 MG  tablet Take 1 tablet (6.25 mg total) by mouth 2 (two) times daily with a meal.   cloNIDine (CATAPRES) 0.1 MG tablet TAKE 1 TABLET BY MOUTH DAILY   losartan (COZAAR) 100 MG tablet Take 1 tablet (100 mg total) by mouth daily.   meloxicam (MOBIC) 15 MG tablet TAKE 1/2 TO 1 TABLET BY MOUTH  DAILY AS NEEDED FOR PAIN   Multiple Vitamins-Minerals (ONE-A-DAY MENS HEALTH FORMULA) TABS Take 1 tablet by mouth daily.   Naphazoline-Hypromellose (TGT LUBRICANT REDNESS RELIEVER OP) Place 1 drop into both eyes daily as needed (redness/ dryness).   Omega-3 Fatty Acids (FISH OIL) 1000 MG CAPS Take 1,000 mg by mouth daily.   rosuvastatin (CRESTOR) 40 MG tablet TAKE 1 TABLET BY MOUTH AT  BEDTIME FOR CHOLESTEROL   No facility-administered encounter medications on file as of 08/12/2022.    Allergies (verified) Patient has no known allergies.   History: Past Medical History:  Diagnosis Date   AAA (abdominal aortic aneurysm) ([place])    Bowel obstruction ([place]) 2016   during septic event. 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 dr. Holley Raring follows him once a year   Hyperlipidemia    Hypertension    Hypothyroidism    Inguinal hernia    Mild atherosclerosis  of carotid artery 12/2017   bilateral stenosis per vascular surgeon   Pink eye 06/21/2016   S/P Botox injection    every 3 months into right arm to loosen up muscles   Sepsis ([place]) 2016   happened after stroke. unsure when,but kidney disease started. followed by dr. Holley Raring   Stroke [facility]) 12/24/2013   right side weakness   Past Surgical History:  Procedure Laterality Date   HERNIA REPAIR  12/23/2015   HYDROCELE EXCISION Left 01/18/2018   Procedure: HYDROCELECTOMY ADULT;  Surgeon: Hollice Espy, MD;  Location: [facility] ORS;  Service: Urology;  Laterality: Left;   INGUINAL HERNIA REPAIR Bilateral 06/25/2016    Procedure: HERNIA REPAIR INGUINAL ADULT BILATERAL;  Surgeon: Christene Lye, MD;  Location: [facility] ORS;  Service: General;  Laterality: Bilateral;   KNEE ARTHROSCOPY Right 13's   Family History  Problem Relation Age of Onset   Hypertension Mother    Hernia Sister    Prostate cancer Neg Hx    Bladder Cancer Neg Hx    Kidney cancer Neg Hx    Social History   Socioeconomic History   Marital status: Married    Spouse name: rosamund   Number of children: 0   Years of education: Not on file   Highest education level: Not on file  Occupational History   Not on file  Tobacco Use   Smoking status: Never   Smokeless tobacco: Never  Vaping Use   Vaping Use: Never used  Substance and Sexual Activity   Alcohol use: No    Alcohol/week: 0.0 standard drinks of alcohol   Drug use: No    Comment: used to use THC in the late 1980s   Sexual activity: Not Currently  Other Topics Concern   Not on file  Social History Narrative   Not on file   Social Determinants of Health   Financial Resource Strain: Low Risk  (08/09/2022)   Overall Financial Resource Strain (CARDIA)    Difficulty of Paying Living Expenses: Not very hard  Food Insecurity: No Food Insecurity (08/09/2022)   Hunger Vital Sign    Worried About Running Out of Food in the Last Year: Never true    Ran Out of Food in the Last Year: Never true  Transportation Needs: No Transportation Needs (08/09/2022)   PRAPARE - Hydrologist (Medical): No    Lack of Transportation (Non-Medical): No  Physical Activity: Insufficiently Active (08/09/2022)   Exercise Vital Sign    Days of Exercise per Week: 3 days    Minutes of Exercise per Session: 20 min  Stress: No Stress Concern Present (08/09/2022)   [place]    Feeling of Stress : Only a little  Social Connections: Unknown (08/09/2022)   Social Connection and Isolation Panel [NHANES]     Frequency of Communication with Friends and Family: Three times a week    Frequency of Social Gatherings with Friends and Family: Twice a week    Attends Religious Services: Not on Advertising copywriter or Organizations: Yes    Attends Archivist Meetings: 1 to 4 times per year    Marital Status: Married    Tobacco Counseling Counseling given: Not Answered   Clinical Intake:  Pre-visit preparation completed: Yes  Pain : No/denies pain     BMI - recorded: 29.15 Nutritional Status: BMI 25 -29 Overweight Nutritional Risks: None Diabetes: No  How often  do you need to have someone help you when you read instructions, pamphlets, or other written materials from your doctor or pharmacy?: 1 - Never  Diabetic?no  Interpreter Needed?: No  Comments: lives w/wife Information entered by :: B.Kayleb Warshaw,LPN   Activities of Daily Living    08/09/2022   12:19 AM 05/25/2022    1:50 PM  In your present state of health, do you have any difficulty performing the following activities:  Hearing? 0 0  Vision? 0 0  Difficulty concentrating or making decisions? 0 0  Walking or climbing stairs? 1 1  Dressing or bathing? 0 0  Doing errands, shopping? 0 0  Preparing Food and eating ? N   Using the Toilet? N   In the past six months, have you accidently leaked urine? N   Do you have problems with loss of bowel control? N   Managing your Medications? N   Managing your Finances? N   Housekeeping or managing your Housekeeping? N     Patient Care Team: Delsa Grana, PA-C as PCP - General (Family Medicine) Lorretta Harp, MD as Consulting Physician (Cardiology) Hollice Espy, MD as Consulting Physician (Urology) Letta Pate Luanna Salk, MD as Consulting Physician (Physical Medicine and Rehabilitation)  Indicate any recent Medical Services you may have received from other than [facility] providers in the past year (date may be approximate).     Assessment:   This is a routine  wellness examination for [place].  Hearing/Vision screen Hearing Screening - Comments:: Adequate hearing Ringing in the ears  Vision Screening - Comments:: Adequate vision [facility]  Dietary issues and exercise activities discussed: Current Exercise Habits: The patient does not participate in regular exercise at present, Exercise limited by: neurologic condition(s)   Goals Addressed   None    Depression Screen    08/12/2022    2:40 PM 05/25/2022    1:50 PM 01/26/2022   11:00 AM 10/14/2021    3:29 PM 09/10/2021   11:43 AM 08/06/2021    3:31 PM 04/10/2021    1:54 PM  PHQ 2/9 Scores  PHQ - 2 Score 0 0 0 0 0 0 0  PHQ- 9 Score  0  0   0    Fall Risk    08/09/2022   12:19 AM 07/06/2022    1:12 PM 05/25/2022    1:50 PM 01/26/2022   11:00 AM 10/14/2021    3:18 PM  [place] in the past year? 0 0 0 0 0  Number falls in past yr: 0  0 0   Injury with Fall? 0  0 0   Risk for fall due to :   No Fall Risks  No Fall Risks  Follow up   Falls prevention discussed;Education provided;Falls evaluation completed  Falls prevention discussed    FALL RISK PREVENTION PERTAINING TO THE HOME:  Any stairs in or around the home? No  If so, are there any without handrails? No  Home free of loose throw rugs in walkways, pet beds, electrical cords, etc? Yes  Adequate lighting in your home to reduce risk of falls? Yes   ASSISTIVE DEVICES UTILIZED TO PREVENT FALLS:  Life alert? No  Use of a cane, walker or w/c? Yes cane for very long walk Grab bars in the bathroom? Yes  Shower chair or bench in shower? Yes  Elevated toilet seat or a handicapped toilet? No   Cognitive Function:        08/12/2022  2:44 PM 04/06/2019   11:04 AM  6CIT Screen  What Year? 0 points 0 points  What month? 0 points 0 points  What time? 0 points 0 points  Count back from 20 0 points 0 points  Months in reverse 0 points 0 points  Repeat phrase 0 points 0 points  Total Score 0 points 0 points     Immunizations Immunization History  Administered Date(s) Administered   Fluad Quad(high Dose 65+) 02/25/2020, 04/10/2021, 05/25/2022   Influenza,inj,Quad PF,6+ Mos 02/18/2015, 03/09/2017, 03/10/2018, 03/14/2019   PFIZER(Purple Top)SARS-COV-2 Vaccination 08/23/2019, 09/17/2019, 04/05/2020   Tdap 07/21/2015    TDAP status: Up to date  Flu Vaccine status: Up to date  Pneumococcal vaccine status: Declined,  Education has been provided regarding the importance of this vaccine but patient still declined. Advised may receive this vaccine at local pharmacy or Health Dept. Aware to provide a copy of the vaccination record if obtained from local pharmacy or Health Dept. Verbalized acceptance and understanding.   Covid-19 vaccine status: Completed vaccines  Qualifies for Shingles Vaccine? Yes   Zostavax completed No   Shingrix Completed?: No.    Education has been provided regarding the importance of this vaccine. Patient has been advised to call insurance company to determine out of pocket expense if they have not yet received this vaccine. Advised may also receive vaccine at local pharmacy or Health Dept. Verbalized acceptance and understanding.  Screening Tests Health Maintenance  Topic Date Due   Zoster Vaccines- Shingrix (1 of 2) Never done   COVID-19 Vaccine (4 - 2023-24 season) 01/22/2022   Pneumonia Vaccine 76+ Years old (1 of 1 - PCV) 10/15/2022 (Originally 07/01/2019)   Fecal DNA (Cologuard)  04/20/2023 (Originally 07/01/1999)   Medicare Annual Wellness (AWV)  08/12/2023   DTaP/Tdap/Td (2 - Td or Tdap) 07/20/2025   INFLUENZA VACCINE  Completed   Hepatitis C Screening  Completed   HPV VACCINES  Aged Out    Health Maintenance  Health Maintenance Due  Topic Date Due   Zoster Vaccines- Shingrix (1 of 2) Never done   COVID-19 Vaccine (4 - 2023-24 season) 01/22/2022    Colorectal cancer screening: Type of screening: Cologuard. Completed no. Repeat every 3 years pt  declines  Lung Cancer Screening: (Low Dose CT Chest recommended if Age 1-80 years, 30 pack-year currently smoking OR have quit w/in 15years.) does not qualify.   Lung Cancer Screening Referral: no  Additional Screening:  Hepatitis C Screening: does not qualify; Completed no  Vision Screening: Recommended annual ophthalmology exams for early detection of glaucoma and other disorders of the eye. Is the patient up to date with their annual eye exam?  Yes  Who is the provider or what is the name of the office in which the patient attends annual eye exams? [facility] If pt is not established with a provider, would they like to be referred to a provider to establish care? No .   Dental Screening: Recommended annual dental exams for proper oral hygiene  Community Resource Referral / Chronic Care Management: CRR required this visit?  No   CCM required this visit?  No      Plan:     I have personally reviewed and noted the following in the patient's chart:   Medical and social history Use of alcohol, tobacco or illicit drugs  Current medications and supplements including opioid prescriptions. Patient is not currently taking opioid prescriptions. Functional ability and status Nutritional status Physical activity Advanced directives List of other  physicians Hospitalizations, surgeries, and ER visits in previous 12 months Vitals Screenings to include cognitive, depression, and falls Referrals and appointments  In addition, I have reviewed and discussed with patient certain preventive protocols, quality metrics, and best practice recommendations. A written personalized care plan for preventive services as well as general preventive health recommendations were provided to patient.     Roger Shelter, LPN   [id number]   Nurse Notes: pt is doing well:he has no questions or concerns during the visit.

## 2022-08-12 NOTE — Patient Instructions (Signed)
[name] , Thank you for taking time to come for your Medicare Wellness Visit. I appreciate your ongoing commitment to your health goals. Please review the following plan we discussed and let me know if I can assist you in the future.   These are the goals we discussed:  Goals      Weight (lb) < 210 lb (95.3 kg)     Pt would like to lose a little more weight to help make sure prediabetes stays under control.         This is a list of the screening recommended for you and due dates:  Health Maintenance  Topic Date Due   Zoster (Shingles) Vaccine (1 of 2) Never done   COVID-19 Vaccine (4 - 2023-24 season) 01/22/2022   Pneumonia Vaccine (1 of 1 - PCV) 10/15/2022*   Cologuard (Stool DNA test)  04/20/2023*   Medicare Annual Wellness Visit  08/12/2023   DTaP/Tdap/Td vaccine (2 - Td or Tdap) 07/20/2025   Flu Shot  Completed   Hepatitis C Screening: USPSTF Recommendation to screen - Ages 18-79 yo.  Completed   HPV Vaccine  Aged Out  *Topic was postponed. The date shown is not the original due date.    Advanced directives: yes  Conditions/risks identified: none  Next appointment: Follow up in one year for your annual wellness visit. 08/18/2023 @2 :30pm telephone  Preventive Care 37 Years and Older, Male  Preventive care refers to lifestyle choices and visits with your health care provider that can promote health and wellness. What does preventive care include? A yearly physical exam. This is also called an annual well check. Dental exams once or twice a year. Routine eye exams. Ask your health care provider how often you should have your eyes checked. Personal lifestyle choices, including: Daily care of your teeth and gums. Regular physical activity. Eating a healthy diet. Avoiding tobacco and drug use. Limiting alcohol use. Practicing safe sex. Taking low doses of aspirin every day. Taking vitamin and mineral supplements as recommended by your health care provider. What  happens during an annual well check? The services and screenings done by your health care provider during your annual well check will depend on your age, overall health, lifestyle risk factors, and family history of disease. Counseling  Your health care provider may ask you questions about your: Alcohol use. Tobacco use. Drug use. Emotional well-being. Home and relationship well-being. Sexual activity. Eating habits. History of falls. Memory and ability to understand (cognition). Work and work Statistician. Screening  You may have the following tests or measurements: Height, weight, and BMI. Blood pressure. Lipid and cholesterol levels. These may be checked every 5 years, or more frequently if you are over 39 years old. Skin check. Lung cancer screening. You may have this screening every year starting at age 68 if you have a 30-pack-year history of smoking and currently smoke or have quit within the past 15 years. Fecal occult blood test (FOBT) of the stool. You may have this test every year starting at age 68 Flexible sigmoidoscopy or colonoscopy. You may have a sigmoidoscopy every 5 years or a colonoscopy every 10 years starting at age 68 Prostate cancer screening. Recommendations will vary depending on your family history and other risks. Hepatitis C blood test. Hepatitis B blood test. Sexually transmitted disease (STD) testing. Diabetes screening. This is done by checking your blood sugar (glucose) after you have not eaten for a while (fasting). You may have this done every 1-3 years. Abdominal  aortic aneurysm (AAA) screening. You may need this if you are a current or former smoker. Osteoporosis. You may be screened starting at age 68 if you are at high risk. Talk with your health care provider about your test results, treatment options, and if necessary, the need for more tests. Vaccines  Your health care provider may recommend certain vaccines, such as: Influenza vaccine. This  is recommended every year. Tetanus, diphtheria, and acellular pertussis (Tdap, Td) vaccine. You may need a Td booster every 10 years. Zoster vaccine. You may need this after age 27. Pneumococcal 13-valent conjugate (PCV13) vaccine. One dose is recommended after age 68. Pneumococcal polysaccharide (PPSV23) vaccine. One dose is recommended after age 40. Talk to your health care provider about which screenings and vaccines you need and how often you need them. This information is not intended to replace advice given to you by your health care provider. Make sure you discuss any questions you have with your health care provider. Document Released: 06/06/2015 Document Revised: 01/28/2016 Document Reviewed: 03/11/2015 Elsevier Interactive Patient Education  2017 [place] Prevention in the Home Falls can cause injuries. They can happen to people of all ages. There are many things you can do to make your home safe and to help prevent falls. What can I do on the outside of my home? Regularly fix the edges of walkways and driveways and fix any cracks. Remove anything that might make you trip as you walk through a door, such as a raised step or threshold. Trim any bushes or trees on the path to your home. Use bright outdoor lighting. Clear any walking paths of anything that might make someone trip, such as rocks or tools. Regularly check to see if handrails are loose or broken. Make sure that both sides of any steps have handrails. Any raised decks and porches should have guardrails on the edges. Have any leaves, snow, or ice cleared regularly. Use sand or salt on walking paths during winter. Clean up any spills in your garage right away. This includes oil or grease spills. What can I do in the bathroom? Use night lights. Install grab bars by the toilet and in the tub and shower. Do not use towel bars as grab bars. Use non-skid mats or decals in the tub or shower. If you need to sit down  in the shower, use a plastic, non-slip stool. Keep the floor dry. Clean up any water that spills on the floor as soon as it happens. Remove soap buildup in the tub or shower regularly. Attach bath mats securely with double-sided non-slip rug tape. Do not have throw rugs and other things on the floor that can make you trip. What can I do in the bedroom? Use night lights. Make sure that you have a light by your bed that is easy to reach. Do not use any sheets or blankets that are too big for your bed. They should not hang down onto the floor. Have a firm chair that has side arms. You can use this for support while you get dressed. Do not have throw rugs and other things on the floor that can make you trip. What can I do in the kitchen? Clean up any spills right away. Avoid walking on wet floors. Keep items that you use a lot in easy-to-reach places. If you need to reach something above you, use a strong step stool that has a grab bar. Keep electrical cords out of the way. Do not use  floor polish or wax that makes floors slippery. If you must use wax, use non-skid floor wax. Do not have throw rugs and other things on the floor that can make you trip. What can I do with my stairs? Do not leave any items on the stairs. Make sure that there are handrails on both sides of the stairs and use them. Fix handrails that are broken or loose. Make sure that handrails are as long as the stairways. Check any carpeting to make sure that it is firmly attached to the stairs. Fix any carpet that is loose or worn. Avoid having throw rugs at the top or bottom of the stairs. If you do have throw rugs, attach them to the floor with carpet tape. Make sure that you have a light switch at the top of the stairs and the bottom of the stairs. If you do not have them, ask someone to add them for you. What else can I do to help prevent falls? Wear shoes that: Do not have high heels. Have rubber bottoms. Are comfortable  and fit you well. Are closed at the toe. Do not wear sandals. If you use a stepladder: Make sure that it is fully opened. Do not climb a closed stepladder. Make sure that both sides of the stepladder are locked into place. Ask someone to hold it for you, if possible. Clearly mark and make sure that you can see: Any grab bars or handrails. First and last steps. Where the edge of each step is. Use tools that help you move around (mobility aids) if they are needed. These include: Canes. Walkers. Scooters. Crutches. Turn on the lights when you go into a dark area. Replace any light bulbs as soon as they burn out. Set up your furniture so you have a clear path. Avoid moving your furniture around. If any of your floors are uneven, fix them. If there are any pets around you, be aware of where they are. Review your medicines with your doctor. Some medicines can make you feel dizzy. This can increase your chance of falling. Ask your doctor what other things that you can do to help prevent falls. This information is not intended to replace advice given to you by your health care provider. Make sure you discuss any questions you have with your health care provider. Document Released: 03/06/2009 Document Revised: 10/16/2015 Document Reviewed: 06/14/2014 Elsevier Interactive Patient Education  2017 [employer].

## 2022-08-13 MED ORDER — BACLOFEN 5 MG PO TABS: 2.5000 mg | ORAL_TABLET | Freq: Three times a day (TID) | ORAL | 1 refills | Status: DC | PRN

## 2022-08-13 NOTE — Addendum Note (Signed)
Addended by: Delsa Grana on: 08/13/2022 06:23 PM   Modules accepted: Orders

## 2022-08-18 ENCOUNTER — Other Ambulatory Visit: Payer: Self-pay | Admitting: Family Medicine

## 2022-08-18 DIAGNOSIS — I10 Essential (primary) hypertension: Secondary | ICD-10-CM

## 2022-08-25 ENCOUNTER — Encounter: Payer: Self-pay | Admitting: Family Medicine

## 2022-08-26 ENCOUNTER — Other Ambulatory Visit: Payer: Self-pay

## 2022-08-26 DIAGNOSIS — G8110 Spastic hemiplegia affecting unspecified side: Secondary | ICD-10-CM

## 2022-08-26 MED ORDER — MELOXICAM 15 MG PO TABS: ORAL_TABLET | ORAL | 0 refills | Status: DC

## 2022-09-15 ENCOUNTER — Other Ambulatory Visit: Payer: Self-pay | Admitting: Internal Medicine

## 2022-09-15 DIAGNOSIS — I6523 Occlusion and stenosis of bilateral carotid arteries: Secondary | ICD-10-CM

## 2022-09-15 DIAGNOSIS — I700 Atherosclerosis of aorta: Secondary | ICD-10-CM

## 2022-09-15 DIAGNOSIS — E785 Hyperlipidemia, unspecified: Secondary | ICD-10-CM

## 2022-09-16 NOTE — Telephone Encounter (Signed)
Requested Prescriptions  Pending Prescriptions Disp Refills   rosuvastatin (CRESTOR) 40 MG tablet [Pharmacy Med Name: Rosuvastatin Calcium 40 MG Oral Tablet] 90 tablet 2    Sig: TAKE 1 TABLET BY MOUTH AT  BEDTIME FOR CHOLESTEROL     Cardiovascular:  Antilipid - Statins 2 Failed - 09/15/2022 10:22 PM      Failed - Lipid Panel in normal range within the last 12 months    Cholesterol, Total  Date Value Ref Range Status  03/11/2015 198 100 - 199 mg/dL Final   Cholesterol  Date Value Ref Range Status  05/25/2022 156 <200 mg/dL Final  16/02/9603 540 (H) 0 - 200 mg/dL Final   Ldl Cholesterol, Calc  Date Value Ref Range Status  12/25/2013 147 (H) 0 - 100 mg/dL Final   LDL Cholesterol (Calc)  Date Value Ref Range Status  05/25/2022 82 mg/dL (calc) Final    Comment:    Reference range: <100 . Desirable range <100 mg/dL for primary prevention;   <70 mg/dL for patients with CHD or diabetic patients  with > or = 2 CHD risk factors. Marland Kitchen LDL-C is now calculated using the Martin-Hopkins  calculation, which is a validated novel method providing  better accuracy than the Friedewald equation in the  estimation of LDL-C.  Horald Pollen et al. Lenox Ahr. 9811;914(78): 2061-2068  (http://education.[facility].com/faq/FAQ164)    HDL Cholesterol  Date Value Ref Range Status  12/25/2013 49 40 - 60 mg/dL Final   HDL  Date Value Ref Range Status  05/25/2022 64 > OR = 40 mg/dL Final  29/56/2130 58 >86 mg/dL Final    Comment:    According to ATP-III Guidelines, HDL-C >59 mg/dL is considered a negative risk factor for CHD.    Triglycerides  Date Value Ref Range Status  05/25/2022 34 <150 mg/dL Final  57/84/6962 59 0 - 200 mg/dL Final         Passed - Cr in normal range and within 360 days    Creat  Date Value Ref Range Status  05/25/2022 0.93 0.70 - 1.35 mg/dL Final   Creatinine, Urine  Date Value Ref Range Status  05/14/2016 126 mg/dL Final         Passed - Patient is not pregnant       Passed - Valid encounter within last 12 months    Recent Outpatient Visits           3 months ago Hypertension goal BP (blood pressure) < 140/90   [facility] Danelle Berry, PA-C   4 months ago No-show for appointment   [facility] Mecum, Erin E, PA-C   11 months ago Hypertension goal BP (blood pressure) < 140/90   [facility] Danelle Berry, PA-C   1 year ago Hypertension goal BP (blood pressure) < 140/90   [facility] Berniece Salines, FNP   1 year ago Upper respiratory tract infection due to COVID-19 virus   [facility] Danelle Berry, PA-C       Future Appointments             In 3 months Danelle Berry, PA-C [facility]

## 2022-11-03 ENCOUNTER — Other Ambulatory Visit: Payer: Self-pay | Admitting: Family Medicine

## 2022-11-03 DIAGNOSIS — G8110 Spastic hemiplegia affecting unspecified side: Secondary | ICD-10-CM

## 2022-11-05 ENCOUNTER — Ambulatory Visit: Payer: Medicare Other | Admitting: Physical Medicine & Rehabilitation

## 2022-11-05 ENCOUNTER — Encounter: Payer: Medicare Other | Admitting: Physical Medicine & Rehabilitation

## 2022-12-09 ENCOUNTER — Encounter: Payer: Medicare Other | Admitting: Physical Medicine & Rehabilitation

## 2022-12-17 ENCOUNTER — Ambulatory Visit (INDEPENDENT_AMBULATORY_CARE_PROVIDER_SITE_OTHER): Payer: Medicare Other | Admitting: Family Medicine

## 2022-12-17 ENCOUNTER — Encounter: Payer: Self-pay | Admitting: Family Medicine

## 2022-12-17 VITALS — BP 138/68 | HR 69 | Temp 98.1°F | Resp 18 | Ht 71.5 in | Wt 215.2 lb

## 2022-12-17 DIAGNOSIS — I6523 Occlusion and stenosis of bilateral carotid arteries: Secondary | ICD-10-CM

## 2022-12-17 DIAGNOSIS — Z1211 Encounter for screening for malignant neoplasm of colon: Secondary | ICD-10-CM

## 2022-12-17 DIAGNOSIS — E039 Hypothyroidism, unspecified: Secondary | ICD-10-CM

## 2022-12-17 DIAGNOSIS — G8110 Spastic hemiplegia affecting unspecified side: Secondary | ICD-10-CM

## 2022-12-17 DIAGNOSIS — I10 Essential (primary) hypertension: Secondary | ICD-10-CM

## 2022-12-17 DIAGNOSIS — R7303 Prediabetes: Secondary | ICD-10-CM | POA: Diagnosis not present

## 2022-12-17 DIAGNOSIS — Z5181 Encounter for therapeutic drug level monitoring: Secondary | ICD-10-CM

## 2022-12-17 DIAGNOSIS — E785 Hyperlipidemia, unspecified: Secondary | ICD-10-CM | POA: Diagnosis not present

## 2022-12-17 DIAGNOSIS — Z23 Encounter for immunization: Secondary | ICD-10-CM

## 2022-12-17 DIAGNOSIS — I700 Atherosclerosis of aorta: Secondary | ICD-10-CM

## 2022-12-17 LAB — CBC WITH DIFFERENTIAL/PLATELET
Absolute Monocytes: 300 cells/uL (ref 200–950)
Basophils Absolute: 38 cells/uL (ref 0–200)
Basophils Relative: 1 %
Eosinophils Absolute: 129 cells/uL (ref 15–500)
Eosinophils Relative: 3.4 %
HCT: 42.8 % (ref 38.5–50.0)
Hemoglobin: 14.1 g/dL (ref 13.2–17.1)
Lymphs Abs: 1113 cells/uL (ref 850–3900)
MCH: 28.5 pg (ref 27.0–33.0)
MCHC: 32.9 g/dL (ref 32.0–36.0)
MCV: 86.6 fL (ref 80.0–100.0)
MPV: 11.7 fL (ref 7.5–12.5)
Monocytes Relative: 7.9 %
Neutro Abs: 2219 cells/uL (ref 1500–7800)
Neutrophils Relative %: 58.4 %
Platelets: 215 10*3/uL (ref 140–400)
RBC: 4.94 10*6/uL (ref 4.20–5.80)
RDW: 13 % (ref 11.0–15.0)
Total Lymphocyte: 29.3 %
WBC: 3.8 10*3/uL (ref 3.8–10.8)

## 2022-12-17 MED ORDER — CARVEDILOL 6.25 MG PO TABS: 6.2500 mg | ORAL_TABLET | Freq: Two times a day (BID) | ORAL | 1 refills | Status: DC

## 2022-12-17 NOTE — Assessment & Plan Note (Signed)
Blood pressure is still elevated slightly above goal but it does tend to be higher in the doctor's office and he is monitoring at home and reports at home readings are well-controlled Currently managed on losartan 100 mg, amlodipine 10 mg and carvedilol 6.25 mg twice daily We will have him bring his home blood pressure cuff next time he comes to the office For now no med changes he does have a as needed clonidine for very high blood pressure readings, he reports not needing to use this due to home BP ranging 110-130

## 2022-12-17 NOTE — Assessment & Plan Note (Signed)
On statin, monitoring 

## 2022-12-17 NOTE — Assessment & Plan Note (Signed)
His last lipids were at goal on over-the-counter omega-3 fatty acid supplements and rosuvastatin 40 mg daily with no side effects or concerns Lab Results  Component Value Date   CHOL 156 05/25/2022   HDL 64 05/25/2022   LDLCALC 82 05/25/2022   TRIG 34 05/25/2022   CHOLHDL 2.4 05/25/2022  History of stroke so patient will be maintained on high intensity statin and aspirin

## 2022-12-17 NOTE — Assessment & Plan Note (Signed)
Stable symptoms, managed by multiple specialists

## 2022-12-17 NOTE — Assessment & Plan Note (Signed)
Last labs were done several years ago and normal without medications

## 2022-12-17 NOTE — Patient Instructions (Signed)
Bring your BP cuff to your next appointment just for [place] to verify its accuracy  You can do you shingles shot here or at a pharmacy - just ask your insurance if they will pay for it to be done at your doctors office. You should get the cologuard box soon at home and likely will hear from the cologuard company to help you complete it.  Health Maintenance  Topic Date Due   Pneumonia Vaccine (1 of 1 - PCV) Never done   COVID-19 Vaccine (4 - 2023-24 season) 01/02/2023*   Zoster (Shingles) Vaccine (1 of 2) 03/19/2023*   Cologuard (Stool DNA test)  04/20/2023*   Flu Shot  12/23/2022   Medicare Annual Wellness Visit  08/12/2023   DTaP/Tdap/Td vaccine (2 - Td or Tdap) 07/20/2025   Hepatitis C Screening  Completed   HPV Vaccine  Aged Out  *Topic was postponed. The date shown is not the original due date.

## 2022-12-17 NOTE — Assessment & Plan Note (Signed)
Stable prediabetes over the past couple years recheck labs today

## 2022-12-17 NOTE — Progress Notes (Signed)
Name: HEZRON BRODEUR   MRN: [id number]    DOB: 1955-08-06   Date:12/17/2022       Progress Note  Chief Complaint  Patient presents with   Follow-up   Hypertension   Hyperlipidemia     Subjective:   [name] is a 67 y.o. male, presents to clinic for routine f/up on chronic conditions  HTN with white coat syndrome BP checking at home 110-130/70-80's On carvedilol losartan norvasc and clonidine as a rescue prn BP Readings from Last 3 Encounters:  12/17/22 (!) 150/78  08/12/22 137/79  08/05/22 (!) 168/90  Checking often with his wife at home at least checking a few times a week  Hyperlipidemia: Currently treated with crestor 40 mg, pt reports good med compliance Last Lipids: Lab Results  Component Value Date   CHOL 156 05/25/2022   HDL 64 05/25/2022   LDLCALC 82 05/25/2022   TRIG 34 05/25/2022   CHOLHDL 2.4 05/25/2022   - Denies: Chest pain, shortness of breath, myalgias, claudication  Prediabetes - has been stable, not on meds Lab Results  Component Value Date   HGBA1C 6.2 (H) 05/25/2022  No sig weight/diet changes since last labs   Health Maintenance  Topic Date Due   Pneumonia Vaccine (1 of 1 - PCV) Never done   COVID-19 Vaccine (4 - 2023-24 season) 01/02/2023*   Zoster (Shingles) Vaccine (1 of 2) 03/19/2023*   Cologuard (Stool DNA test)  04/20/2023*   Flu Shot  12/23/2022   Medicare Annual Wellness Visit  08/12/2023   DTaP/Tdap/Td vaccine (2 - Td or Tdap) 07/20/2025   Hepatitis C Screening  Completed   HPV Vaccine  Aged Out  *Topic was postponed. The date shown is not the original due date.   Due for cologuard, pneumococcal vaccine, shingrix  Labs and past dx reviewed      Current Outpatient Medications:    acetaminophen (TYLENOL) 500 MG tablet, Take 1,000 mg by mouth daily as needed for moderate pain., Disp: , Rfl:    amLODipine (NORVASC) 10 MG tablet, TAKE 1 TABLET BY MOUTH DAILY, Disp: 60 tablet, Rfl: 5   aspirin EC 325 MG tablet,  TAKE 1 TABLET (325 MG TOTAL) BY MOUTH DAILY., Disp: 90 tablet, Rfl: 3   Baclofen 5 MG TABS, Take 0.5-1 tablets (2.5-5 mg total) by mouth 3 (three) times daily as needed (msk pain or spasms)., Disp: 180 tablet, Rfl: 1   carvedilol (COREG) 6.25 MG tablet, TAKE 1 TABLET BY MOUTH TWICE  DAILY WITH A MEAL, Disp: 120 tablet, Rfl: 5   cloNIDine (CATAPRES) 0.1 MG tablet, TAKE 1 TABLET BY MOUTH DAILY, Disp: 90 tablet, Rfl: 3   losartan (COZAAR) 100 MG tablet, Take 1 tablet (100 mg total) by mouth daily., Disp: 90 tablet, Rfl: 3   meloxicam (MOBIC) 15 MG tablet, TAKE 1/2 TO 1 TABLET BY MOUTH  DAILY AS NEEDED FOR PAIN, Disp: 100 tablet, Rfl: 0   Multiple Vitamins-Minerals (ONE-A-DAY MENS HEALTH FORMULA) TABS, Take 1 tablet by mouth daily., Disp: , Rfl:    Naphazoline-Hypromellose (TGT LUBRICANT REDNESS RELIEVER OP), Place 1 drop into both eyes daily as needed (redness/ dryness)., Disp: , Rfl:    Omega-3 Fatty Acids (FISH OIL) 1000 MG CAPS, Take 1,000 mg by mouth daily., Disp: , Rfl:    rosuvastatin (CRESTOR) 40 MG tablet, TAKE 1 TABLET BY MOUTH AT  BEDTIME FOR CHOLESTEROL, Disp: 90 tablet, Rfl: 2  Patient Active Problem List   Diagnosis Date Noted   Hypothyroidism 03/09/2017  Abdominal aortic atherosclerosis (HCC) 05/20/2016   Pre-diabetes 02/18/2015   Atherosclerosis of both carotid arteries 02/18/2015   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   Shoulder subluxation, right 04/23/2014   Spastic hemiplegia affecting dominant side (HCC) 02/18/2014   Hypertension goal BP (blood pressure) < 140/90 01/17/2014   Hyperlipidemia LDL goal <70 01/17/2014    Past Surgical History:  Procedure Laterality Date   HERNIA REPAIR  12/23/2015   HYDROCELE EXCISION Left 01/18/2018   Procedure: HYDROCELECTOMY ADULT;  Surgeon: Vanna Scotland, MD;  Location: [facility] ORS;  Service: Urology;  Laterality: Left;   INGUINAL HERNIA REPAIR Bilateral 06/25/2016   Procedure: HERNIA REPAIR INGUINAL ADULT BILATERAL;  Surgeon:  Kieth Brightly, MD;  Location: [facility];  Service: General;  Laterality: Bilateral;   KNEE ARTHROSCOPY Right 1970's    Family History  Problem Relation Age of Onset   Hypertension Mother    Hernia Sister    Prostate cancer Neg Hx    Bladder Cancer Neg Hx    Kidney cancer Neg Hx     Social History   Tobacco Use   Smoking status: Never   Smokeless tobacco: Never  Vaping Use   Vaping status: Never Used  Substance Use Topics   Alcohol use: No    Alcohol/week: 0.0 standard drinks of alcohol   Drug use: No    Comment: used to use THC in the late 1980s     No Known Allergies  Health Maintenance  Topic Date Due   Pneumonia Vaccine 60+ Years old (1 of 1 - PCV) Never done   COVID-19 Vaccine (4 - 2023-24 season) 01/02/2023 (Originally 01/22/2022)   Zoster Vaccines- Shingrix (1 of 2) 03/19/2023 (Originally 06/30/2004)   Fecal DNA (Cologuard)  04/20/2023 (Originally 07/01/1999)   INFLUENZA VACCINE  12/23/2022   Medicare Annual Wellness (AWV)  08/12/2023   DTaP/Tdap/Td (2 - Td or Tdap) 07/20/2025   Hepatitis C Screening  Completed   HPV VACCINES  Aged Out    Chart Review Today: I personally reviewed active problem list, medication list, allergies, family history, social history, health maintenance, notes from last encounter, lab results, imaging with the patient/caregiver today.   Review of Systems  Constitutional: Negative.   HENT: Negative.    Eyes: Negative.   Respiratory: Negative.    Cardiovascular: Negative.   Gastrointestinal: Negative.   Endocrine: Negative.   Genitourinary: Negative.   Musculoskeletal: Negative.   Skin: Negative.   Allergic/Immunologic: Negative.   Neurological: Negative.   Hematological: Negative.   Psychiatric/Behavioral: Negative.    All other systems reviewed and are negative.    Objective:   Vitals:   12/17/22 1335 12/17/22 1410  BP: (!) 150/78 138/68  Pulse: 69   Resp: 18   Temp: 98.1 F (36.7 C)   SpO2: 98%   Weight: 215 lb  3.2 oz (97.6 kg)   Height: 5' 11.5" (1.816 m)     Body mass index is 29.6 kg/m.  Physical Exam Vitals and nursing note reviewed.  Constitutional:      General: He is not in acute distress.    Appearance: Normal appearance. He is normal weight. He is not ill-appearing, toxic-appearing or diaphoretic.  HENT:     Head: Normocephalic and atraumatic.     Right Ear: External ear normal.     Left Ear: External ear normal.     Mouth/Throat:     Pharynx: Oropharynx is clear.  Eyes:     General: No scleral icterus.  Right eye: No discharge.        Left eye: No discharge.     Conjunctiva/sclera: Conjunctivae normal.  Cardiovascular:     Rate and Rhythm: Normal rate and regular rhythm.     Pulses: Normal pulses.     Heart sounds: Normal heart sounds.  Pulmonary:     Effort: Pulmonary effort is normal. No respiratory distress.     Breath sounds: Normal breath sounds. No stridor. No wheezing, rhonchi or rales.  Abdominal:     General: Bowel sounds are normal. There is no distension.     Palpations: Abdomen is soft.     Tenderness: There is no abdominal tenderness.  Skin:    General: Skin is warm and dry.     Coloration: Skin is not jaundiced.  Neurological:     Mental Status: He is alert. Mental status is at baseline.     Gait: Gait abnormal.     Comments: Right UE hemiplegia Right LE hemiparesis   Psychiatric:        Attention and Perception: Attention normal.        Mood and Affect: Mood is anxious and depressed. Affect is tearful.        Speech: Speech normal.        Behavior: Behavior normal. Behavior is cooperative.        Thought Content: Thought content normal. Thought content does not include homicidal or suicidal ideation. Thought content does not include homicidal or suicidal plan.        Cognition and Memory: Cognition normal.        Judgment: Judgment normal.         Assessment & Plan:   Problem List Items Addressed This Visit       Cardiovascular and  Mediastinum   Hypertension goal BP (blood pressure) < 140/90    Blood pressure is still elevated slightly above goal but it does tend to be higher in the doctor's office and he is monitoring at home and reports at home readings are well-controlled Currently managed on losartan 100 mg, amlodipine 10 mg and carvedilol 6.25 mg twice daily We will have him bring his home blood pressure cuff next time he comes to the office For now no med changes he does have a as needed clonidine for very high blood pressure readings, he reports not needing to use this due to home BP ranging 110-130      Relevant Medications   carvedilol (COREG) 6.25 MG tablet   Other Relevant Orders   COMPLETE METABOLIC PANEL WITH GFR   Hemoglobin A1C   Atherosclerosis of both carotid arteries   Relevant Medications   carvedilol (COREG) 6.25 MG tablet   Other Relevant Orders   COMPLETE METABOLIC PANEL WITH GFR   Hemoglobin A1C   Abdominal aortic atherosclerosis (HCC)    On statin, monitoring      Relevant Medications   carvedilol (COREG) 6.25 MG tablet     Endocrine   Hypothyroidism    Last labs were done several years ago and normal without medications      Relevant Medications   carvedilol (COREG) 6.25 MG tablet     Nervous and Auditory   Spastic hemiplegia affecting dominant side (HCC)    Stable symptoms, managed by multiple specialists        Other   Hyperlipidemia LDL goal <70 - Primary    His last lipids were at goal on over-the-counter omega-3 fatty acid supplements and rosuvastatin 40 mg daily with  no side effects or concerns Lab Results  Component Value Date   CHOL 156 05/25/2022   HDL 64 05/25/2022   LDLCALC 82 05/25/2022   TRIG 34 05/25/2022   CHOLHDL 2.4 05/25/2022  History of stroke so patient will be maintained on high intensity statin and aspirin       Relevant Medications   carvedilol (COREG) 6.25 MG tablet   Other Relevant Orders   COMPLETE METABOLIC PANEL WITH GFR   Hemoglobin  A1C   Pre-diabetes    Stable prediabetes over the past couple years recheck labs today      Relevant Orders   COMPLETE METABOLIC PANEL WITH GFR   Hemoglobin A1C   Other Visit Diagnoses     Encounter for medication monitoring       Relevant Orders   COMPLETE METABOLIC PANEL WITH GFR   Hemoglobin A1C   CBC with Differential/Platelet   Screening for malignant neoplasm of colon       Relevant Orders   Cologuard   Need for pneumococcal vaccination       Done today, patient tolerated   Relevant Orders   Pneumococcal conjugate vaccine 20-valent (Prevnar 20) (Completed)   Need for shingles vaccine       Discussed shingles shot patient is going to ask insurance if it is covered in the office versus at the pharmacy        Return in about 3 months (around 03/19/2023) for Routine follow-up.   Danelle Berry, PA-C 12/17/22 1:51 PM

## 2022-12-24 ENCOUNTER — Ambulatory Visit (INDEPENDENT_AMBULATORY_CARE_PROVIDER_SITE_OTHER): Payer: Medicare Other

## 2022-12-24 DIAGNOSIS — Z23 Encounter for immunization: Secondary | ICD-10-CM | POA: Diagnosis not present

## 2022-12-24 NOTE — Progress Notes (Signed)
Pt verified with insurance prior coming to office if they would cover it and they told him yes.

## 2022-12-28 DIAGNOSIS — H2513 Age-related nuclear cataract, bilateral: Secondary | ICD-10-CM | POA: Diagnosis not present

## 2023-01-26 ENCOUNTER — Other Ambulatory Visit: Payer: Self-pay | Admitting: Family Medicine

## 2023-01-26 DIAGNOSIS — G8110 Spastic hemiplegia affecting unspecified side: Secondary | ICD-10-CM

## 2023-01-30 ENCOUNTER — Other Ambulatory Visit: Payer: Self-pay | Admitting: Family Medicine

## 2023-01-30 DIAGNOSIS — G8110 Spastic hemiplegia affecting unspecified side: Secondary | ICD-10-CM

## 2023-02-10 ENCOUNTER — Encounter: Payer: Self-pay | Admitting: Physical Medicine & Rehabilitation

## 2023-02-10 ENCOUNTER — Other Ambulatory Visit: Payer: Self-pay | Admitting: Family Medicine

## 2023-02-10 ENCOUNTER — Encounter: Payer: Medicare Other | Attending: Physical Medicine & Rehabilitation | Admitting: Physical Medicine & Rehabilitation

## 2023-02-10 VITALS — BP 163/88 | HR 64 | Ht 71.5 in | Wt 225.0 lb

## 2023-02-10 DIAGNOSIS — I10 Essential (primary) hypertension: Secondary | ICD-10-CM

## 2023-02-10 DIAGNOSIS — G8110 Spastic hemiplegia affecting unspecified side: Secondary | ICD-10-CM | POA: Insufficient documentation

## 2023-02-10 MED ORDER — ONABOTULINUMTOXINA 100 UNITS IJ SOLR
100.0000 [IU] | Freq: Once | INTRAMUSCULAR | Status: AC
Start: 2023-02-10 — End: 2023-02-10
  Administered 2023-02-10: 400 [IU] via INTRAMUSCULAR

## 2023-02-10 NOTE — Progress Notes (Signed)
Botox Injection for spasticity using needle EMG guidance  Dilution: 50 Units/ml Indication: Severe spasticity which interferes with ADL,mobility and/or  hygiene and is unresponsive to medication management and other conservative care Informed consent was obtained after describing risks and benefits of the procedure with the patient. This includes bleeding, bruising, infection, excessive weakness, or medication side effects. A REMS form is on file and signed. Last botulinum toxin injection was performed 07/26/22 Needle: 27g 1" needle electrode Number of units per muscle Biceps100 3+ Brachialis 100 3+  Brachioradialis 50U  3+ FCR50 2+  FDS 252+ FDP50 2+ PT25 3+  All injections were done after obtaining appropriate EMG activity and after negative drawback for blood. The patient tolerated the procedure well. Post procedure instructions were given. A followup appointment was made.

## 2023-02-25 ENCOUNTER — Other Ambulatory Visit: Payer: Self-pay | Admitting: Family Medicine

## 2023-02-25 DIAGNOSIS — Z1211 Encounter for screening for malignant neoplasm of colon: Secondary | ICD-10-CM

## 2023-02-25 DIAGNOSIS — Z1212 Encounter for screening for malignant neoplasm of rectum: Secondary | ICD-10-CM

## 2023-02-26 ENCOUNTER — Other Ambulatory Visit: Payer: Self-pay | Admitting: Family Medicine

## 2023-02-26 DIAGNOSIS — I6523 Occlusion and stenosis of bilateral carotid arteries: Secondary | ICD-10-CM

## 2023-02-26 DIAGNOSIS — E785 Hyperlipidemia, unspecified: Secondary | ICD-10-CM

## 2023-02-26 DIAGNOSIS — I700 Atherosclerosis of aorta: Secondary | ICD-10-CM

## 2023-03-04 DIAGNOSIS — Z1211 Encounter for screening for malignant neoplasm of colon: Secondary | ICD-10-CM | POA: Diagnosis not present

## 2023-03-11 ENCOUNTER — Telehealth: Payer: Self-pay | Admitting: Family Medicine

## 2023-03-11 ENCOUNTER — Other Ambulatory Visit: Payer: Self-pay

## 2023-03-11 DIAGNOSIS — R195 Other fecal abnormalities: Secondary | ICD-10-CM

## 2023-03-11 LAB — COLOGUARD: COLOGUARD: POSITIVE — AB

## 2023-03-11 NOTE — Telephone Encounter (Unsigned)
Copied from CRM [id number]. Topic: General - Inquiry >> Mar 11, 2023 12:14 PM Haroldine Laws wrote: Reason for CRM: pt returned call to Dollene Primrose

## 2023-03-15 ENCOUNTER — Telehealth: Payer: Self-pay

## 2023-03-15 ENCOUNTER — Other Ambulatory Visit: Payer: Self-pay

## 2023-03-15 DIAGNOSIS — Z1211 Encounter for screening for malignant neoplasm of colon: Secondary | ICD-10-CM

## 2023-03-15 DIAGNOSIS — R195 Other fecal abnormalities: Secondary | ICD-10-CM

## 2023-03-15 MED ORDER — NA SULFATE-K SULFATE-MG SULF 17.5-3.13-1.6 GM/177ML PO SOLN: 1.0000 | Freq: Once | ORAL | 0 refills | Status: AC

## 2023-03-15 NOTE — Telephone Encounter (Signed)
Gastroenterology Pre-Procedure Review  Request Date: 04/12/23 Requesting Physician: Dr. Allegra Lai  PATIENT REVIEW QUESTIONS: The patient responded to the following health history questions as indicated:    1. Are you having any GI issues? Positive Cologuard No GI Issues last colonoscopy over 20 years ago 2. Do you have a personal history of Polyps? no 3. Do you have a family history of Colon Cancer or Polyps? no 4. Diabetes Mellitus? no 5. Joint replacements in the past 12 months?no 6. Major health problems in the past 3 months?no 7. Any artificial heart valves, MVP, or defibrillator?no    MEDICATIONS & ALLERGIES:    Patient reports the following regarding taking any anticoagulation/antiplatelet therapy:   Plavix, Coumadin, Eliquis, Xarelto, Lovenox, Pradaxa, Brilinta, or Effient? no Aspirin? yes (takes 325 mg of Aspirin will route advice request to PCP)  Patient confirms/reports the following medications:  Current Outpatient Medications  Medication Sig Dispense Refill   acetaminophen (TYLENOL) 500 MG tablet Take 1,000 mg by mouth daily as needed for moderate pain.     amLODipine (NORVASC) 10 MG tablet TAKE 1 TABLET BY MOUTH DAILY 60 tablet 5   aspirin EC 325 MG tablet TAKE 1 TABLET (325 MG TOTAL) BY MOUTH DAILY. 90 tablet 3   Baclofen 5 MG TABS TAKE 1/2 TO 1 TABLET BY MOUTH 3  TIMES DAILY AS NEEDED FOR PAIN  OR SPASMS 180 tablet 1   carvedilol (COREG) 6.25 MG tablet Take 1 tablet (6.25 mg total) by mouth 2 (two) times daily with a meal. 180 tablet 1   cloNIDine (CATAPRES) 0.1 MG tablet TAKE 1 TABLET BY MOUTH DAILY 90 tablet 3   losartan (COZAAR) 100 MG tablet Take 1 tablet (100 mg total) by mouth daily. 90 tablet 3   meloxicam (MOBIC) 15 MG tablet TAKE 1/2 TO 1 TABLET BY MOUTH  DAILY AS NEEDED FOR PAIN 100 tablet 1   Multiple Vitamins-Minerals (ONE-A-DAY MENS HEALTH FORMULA) TABS Take 1 tablet by mouth daily.     Naphazoline-Hypromellose (TGT LUBRICANT REDNESS RELIEVER OP) Place 1 drop  into both eyes daily as needed (redness/ dryness).     Omega-3 Fatty Acids (FISH OIL) 1000 MG CAPS Take 1,000 mg by mouth daily.     rosuvastatin (CRESTOR) 40 MG tablet TAKE 1 TABLET BY MOUTH AT  BEDTIME FOR CHOLESTEROL 90 tablet 2   No current facility-administered medications for this visit.    Patient confirms/reports the following allergies:  No Known Allergies  No orders of the defined types were placed in this encounter.   AUTHORIZATION INFORMATION Primary Insurance: 1D#: Group #:  Secondary Insurance: 1D#: Group #:  SCHEDULE INFORMATION: Date: 04/12/23 Time: Location: [facility]

## 2023-03-15 NOTE — Telephone Encounter (Signed)
Patient called back to schedule procedure.

## 2023-03-17 NOTE — Telephone Encounter (Signed)
Reviewed chart, prior low risk procedures pt was able to hold ASA 7 d and then resume the day after procedure. Form will be completed and faxed back to Patrici Ranks, PA-C

## 2023-03-21 ENCOUNTER — Ambulatory Visit (INDEPENDENT_AMBULATORY_CARE_PROVIDER_SITE_OTHER): Payer: Medicare Other | Admitting: Physician Assistant

## 2023-03-21 ENCOUNTER — Telehealth: Payer: Self-pay

## 2023-03-21 ENCOUNTER — Encounter: Payer: Self-pay | Admitting: Physician Assistant

## 2023-03-21 VITALS — BP 154/80 | HR 74 | Temp 98.1°F | Resp 16 | Ht 71.5 in | Wt 221.6 lb

## 2023-03-21 DIAGNOSIS — I10 Essential (primary) hypertension: Secondary | ICD-10-CM

## 2023-03-21 DIAGNOSIS — R972 Elevated prostate specific antigen [PSA]: Secondary | ICD-10-CM

## 2023-03-21 DIAGNOSIS — Z23 Encounter for immunization: Secondary | ICD-10-CM

## 2023-03-21 DIAGNOSIS — R7303 Prediabetes: Secondary | ICD-10-CM | POA: Diagnosis not present

## 2023-03-21 DIAGNOSIS — E785 Hyperlipidemia, unspecified: Secondary | ICD-10-CM | POA: Diagnosis not present

## 2023-03-21 DIAGNOSIS — E039 Hypothyroidism, unspecified: Secondary | ICD-10-CM | POA: Diagnosis not present

## 2023-03-21 DIAGNOSIS — Z125 Encounter for screening for malignant neoplasm of prostate: Secondary | ICD-10-CM

## 2023-03-21 MED ORDER — AMLODIPINE BESYLATE 10 MG PO TABS: 10.0000 mg | ORAL_TABLET | Freq: Every day | ORAL | 1 refills | Status: DC

## 2023-03-21 NOTE — Assessment & Plan Note (Signed)
Chronic, ongoing Most recent A1c was 6.1 % Recheck today  Results to dictate further management  Continue with lifestyle efforts for now  Follow up in 6 months or sooner if concerns arise

## 2023-03-21 NOTE — Assessment & Plan Note (Addendum)
Chronic, historic condition It is noted that he likely has whitecoat exacerbations in office but we reviewed his home measures which are significantly closer to goal/in goal Appears well managed with current regimen comprised of amlodipine 10 mg p.o. daily, carvedilol 6.25 mg p.o. twice daily, clonidine 0.1 mg p.o. daily, losartan 100 mg p.o. daily He appears to be tolerating this regimen well Continue current regimen Continue to check blood pressures at home and keep a log Follow-up in 6 months or sooner if concerns arise

## 2023-03-21 NOTE — Assessment & Plan Note (Signed)
Chronic, historic condition Appears to be well managed with current regimen comprised of Rosuvastatin 40 mg PO every day, and fish oil  Continue current regimen Recheck lipid panel today Results to dictate further management  Follow up as needed for persistent or progressing symptoms

## 2023-03-21 NOTE — Assessment & Plan Note (Signed)
Chronic, unsure of management at this time Recheck TSH, T4 today Results to dictate further management  If in goal - will proceed with 6 month monitoring unless there are concerns

## 2023-03-21 NOTE — Progress Notes (Signed)
Established Patient Office Visit  Name: [name]   MRN: [id number]    DOB: 27-Nov-1955   Date:03/21/2023  Today's Provider: Jacquelin Hawking, MHS, PA-C Introduced myself to the patient as a PA-C and provided education on APPs in clinical practice.         Subjective  Chief Complaint  Chief Complaint  Patient presents with   Hyperlipidemia   Hypertension   Hypothyroidism    HPI  HYPERTENSION / HYPERLIPIDEMIA Satisfied with current treatment? yes Duration of hypertension: years BP monitoring frequency: a few times a week BP range: today was 138/74 at home- he reports elevated readings at most drs offices  BP medication side effects: no Past BP meds: amlodipine, carvedilol, and losartan (cozaar) Duration of hyperlipidemia: chronic Cholesterol medication side effects: no Cholesterol supplements: fish oil Past cholesterol medications: rosuvastatin (crestor) Medication compliance: good compliance Aspirin: yes Recent stressors: no Recurrent headaches: no Visual changes: no Palpitations: no Dyspnea: no Chest pain: no Lower extremity edema: no Dizzy/lightheaded: no  HYPOTHYROIDISM Thyroid control status: unsure of stability, most recent labs are over 66 years old  Satisfied with current treatment? yes Medication side effects: no Medication compliance: good compliance Etiology of hypothyroidism:  Recent dose adjustment:no- does not appear to be on meds  Fatigue: no Cold intolerance: no Heat intolerance: no Weight gain: no Weight loss: no Constipation: no Diarrhea/loose stools: no Palpitations: no Lower extremity edema: no Anxiety/depressed mood: no      Patient Active Problem List   Diagnosis Date Noted   Hypothyroidism 03/09/2017   Abdominal aortic atherosclerosis (HCC) 05/20/2016   Pre-diabetes 02/18/2015   Atherosclerosis of both carotid arteries 02/18/2015   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   Spastic hemiplegia affecting  dominant side (HCC) 02/18/2014   Hypertension goal BP (blood pressure) < 140/90 01/17/2014   Hyperlipidemia LDL goal <70 01/17/2014    Past Surgical History:  Procedure Laterality Date   HERNIA REPAIR  12/23/2015   HYDROCELE EXCISION Left 01/18/2018   Procedure: HYDROCELECTOMY ADULT;  Surgeon: Vanna Scotland, MD;  Location: [facility];  Service: Urology;  Laterality: Left;   INGUINAL HERNIA REPAIR Bilateral 06/25/2016   Procedure: HERNIA REPAIR INGUINAL ADULT BILATERAL;  Surgeon: Kieth Brightly, MD;  Location: [facility];  Service: General;  Laterality: Bilateral;   KNEE ARTHROSCOPY Right 1970's    Family History  Problem Relation Age of Onset   Hypertension Mother    Hernia Sister    Prostate cancer Neg Hx    Bladder Cancer Neg Hx    Kidney cancer Neg Hx     Social History   Tobacco Use   Smoking status: Never   Smokeless tobacco: Never  Substance Use Topics   Alcohol use: No    Alcohol/week: 0.0 standard drinks of alcohol     Current Outpatient Medications:    acetaminophen (TYLENOL) 500 MG tablet, Take 1,000 mg by mouth daily as needed for moderate pain., Disp: , Rfl:    aspirin EC 325 MG tablet, TAKE 1 TABLET (325 MG TOTAL) BY MOUTH DAILY., Disp: 90 tablet, Rfl: 3   Baclofen 5 MG TABS, TAKE 1/2 TO 1 TABLET BY MOUTH 3  TIMES DAILY AS NEEDED FOR PAIN  OR SPASMS, Disp: 180 tablet, Rfl: 1   carvedilol (COREG) 6.25 MG tablet, Take 1 tablet (6.25 mg total) by mouth 2 (two) times daily with a meal., Disp: 180 tablet, Rfl: 1   cloNIDine (CATAPRES) 0.1 MG tablet, TAKE 1 TABLET  BY MOUTH DAILY, Disp: 90 tablet, Rfl: 3   losartan (COZAAR) 100 MG tablet, Take 1 tablet (100 mg total) by mouth daily., Disp: 90 tablet, Rfl: 3   meloxicam (MOBIC) 15 MG tablet, TAKE 1/2 TO 1 TABLET BY MOUTH  DAILY AS NEEDED FOR PAIN, Disp: 100 tablet, Rfl: 1   Multiple Vitamins-Minerals (ONE-A-DAY MENS HEALTH FORMULA) TABS, Take 1 tablet by mouth daily., Disp: , Rfl:    Naphazoline-Hypromellose (TGT  LUBRICANT REDNESS RELIEVER OP), Place 1 drop into both eyes daily as needed (redness/ dryness)., Disp: , Rfl:    Omega-3 Fatty Acids (FISH OIL) 1000 MG CAPS, Take 1,000 mg by mouth daily., Disp: , Rfl:    rosuvastatin (CRESTOR) 40 MG tablet, TAKE 1 TABLET BY MOUTH AT  BEDTIME FOR CHOLESTEROL, Disp: 90 tablet, Rfl: 2   amLODipine (NORVASC) 10 MG tablet, Take 1 tablet (10 mg total) by mouth daily., Disp: 90 tablet, Rfl: 1  No Known Allergies  I personally reviewed active problem list, medication list, allergies, health maintenance, notes from last encounter, lab results with the patient/caregiver today.   ROS  See HPI for relevant ROS   Objective  Vitals:   03/21/23 1341 03/21/23 1423  BP: (!) 160/84 (!) 154/80  Pulse: 74   Resp: 16   Temp: 98.1 F (36.7 C)   TempSrc: Oral   SpO2: 98%   Weight: 221 lb 9.6 oz (100.5 kg)   Height: 5' 11.5" (1.816 m)     Body mass index is 30.48 kg/m.  Physical Exam Vitals reviewed.  Constitutional:      General: He is awake.     Appearance: Normal appearance. He is well-developed and well-groomed.  HENT:     Head: Normocephalic and atraumatic.  Eyes:     General: Lids are normal. Gaze aligned appropriately.     Extraocular Movements:     Right eye: Normal extraocular motion and no nystagmus.     Left eye: Normal extraocular motion and no nystagmus.  Neck:     Thyroid: No thyroid mass, thyromegaly or thyroid tenderness.     Trachea: Phonation normal.  Cardiovascular:     Rate and Rhythm: Normal rate and regular rhythm.     Pulses: Normal pulses.          Radial pulses are 2+ on the right side and 2+ on the left side.     Heart sounds: Normal heart sounds. No murmur heard.    No friction rub. No gallop.  Pulmonary:     Effort: Pulmonary effort is normal.     Breath sounds: Normal breath sounds. No decreased air movement. No decreased breath sounds, wheezing, rhonchi or rales.  Musculoskeletal:     Cervical back: Normal range of  motion and neck supple.     Right lower leg: 2+ Pitting Edema present.     Left lower leg: 1+ Pitting Edema present.  Skin:    General: Skin is warm and dry.  Neurological:     General: No focal deficit present.     Mental Status: He is alert and oriented to person, place, and time. Mental status is at baseline.     GCS: GCS eye subscore is 4. GCS verbal subscore is 5. GCS motor subscore is 6.     Cranial Nerves: No dysarthria or facial asymmetry.     Motor: Abnormal muscle tone present. No tremor or atrophy.  Psychiatric:        Attention and Perception: Attention and perception normal.  Mood and Affect: Mood and affect normal.        Speech: Speech normal.        Behavior: Behavior normal. Behavior is cooperative.        Thought Content: Thought content normal.        Cognition and Memory: Cognition normal.        Judgment: Judgment normal.      Recent Results (from the past 2160 hour(s))  Cologuard     Status: Abnormal   Collection Time: 03/04/23 11:00 AM  Result Value Ref Range   COLOGUARD Positive (A) Negative    Comment:  POSITIVE TEST RESULT. A positive Cologuard result should be followed with a colonoscopy or visual examination of the colon. The normal value (reference range) for this assay is negative.  TEST DESCRIPTION: Composite algorithmic analysis of stool DNA-biomarkers with hemoglobin immunoassay.   Quantitative values of individual biomarkers are not reportable and are not associated with individual biomarker result reference ranges. Cologuard is intended for colorectal cancer screening of adults of either sex, 45 years or older, who are at average-risk for colorectal cancer (CRC). Cologuard has been approved for use by the U.[place]. FDA. The performance of Cologuard was established in a cross sectional study of average-risk adults aged 36-84. Cologuard performance in patients ages 10 to 60 years was estimated by sub-group analysis of near-age groups. Colonoscopies  performed for a positive result may find as the most clinically significant lesion: colorectal cancer [4.0%], advanced adenoma  (including sessile serrated polyps greater than or equal to 1cm diameter) [20%] or non- advanced adenoma [31%]; or no colorectal neoplasia [45%]. These estimates are derived from a prospective cross-sectional screening study of 10,000 individuals at average risk for colorectal cancer who were screened with both Cologuard and colonoscopy. (Imperiale T. et al, Macy Mis J Med 2014;370(14):1286-1297.) Cologuard may produce a false negative or false positive result (no colorectal cancer or precancerous polyp present at colonoscopy follow up). A negative Cologuard test result does not guarantee the absence of CRC or advanced adenoma (pre-cancer). The current Cologuard screening interval is every 3 years. Science writer and [place] Therapist, music). Cologuard performance data in a 10,000 patient pivotal study using colonoscopy as the reference method can be accessed at the following location: www.exactlabs.com/results. Additional description of the Cologuard test process,  warnings and precautions can be found at www.cologuard.com.      PHQ2/9:    03/21/2023    1:41 PM 02/10/2023   10:49 AM 12/17/2022    1:39 PM 08/12/2022    2:40 PM 05/25/2022    1:50 PM  Depression screen PHQ 2/9  Decreased Interest 0 0 0 0 0  Down, Depressed, Hopeless 1 0 0 0 0  PHQ - 2 Score 1 0 0 0 0  Altered sleeping 0  0  0  Tired, decreased energy 0  0  0  Change in appetite 0  0  0  Feeling bad or failure about yourself  0  0  0  Trouble concentrating 0  0  0  Moving slowly or fidgety/restless 0  0  0  Suicidal thoughts 0  0  0  PHQ-9 Score 1  0  0  Difficult doing work/chores Somewhat difficult  Not difficult at all  Not difficult at all      Fall Risk:    03/21/2023    1:40 PM 02/10/2023   10:49 AM 12/17/2022    1:39 PM 08/09/2022   12:19 AM 07/06/2022  1:12 PM  Fall Risk    Falls in the past year? 0 0 0 0 0  Number falls in past yr: 0 0 0 0   Injury with Fall? 0 0 0 0   Risk for fall due to : No Fall Risks      Follow up Falls prevention discussed;Education provided;Falls evaluation completed          Functional Status Survey: Is the patient deaf or have difficulty hearing?: No Does the patient have difficulty seeing, even when wearing glasses/contacts?: No Does the patient have difficulty concentrating, remembering, or making decisions?: No Does the patient have difficulty walking or climbing stairs?: Yes Does the patient have difficulty dressing or bathing?: No Does the patient have difficulty doing errands alone such as visiting a doctor's office or shopping?: No    Assessment & Plan  Problem List Items Addressed This Visit       Cardiovascular and Mediastinum   Hypertension goal BP (blood pressure) < 140/90 - Primary    Chronic, historic condition It is noted that he likely has whitecoat exacerbations in office but we reviewed his home measures which are significantly closer to goal/in goal Appears well managed with current regimen comprised of amlodipine 10 mg p.o. daily, carvedilol 6.25 mg p.o. twice daily, clonidine 0.1 mg p.o. daily, losartan 100 mg p.o. daily He appears to be tolerating this regimen well Continue current regimen Continue to check blood pressures at home and keep a log Follow-up in 6 months or sooner if concerns arise      Relevant Medications   amLODipine (NORVASC) 10 MG tablet   Other Relevant Orders   COMPLETE METABOLIC PANEL WITH GFR   CBC w/Diff/Platelet     Endocrine   Hypothyroidism    Chronic, unsure of management at this time Recheck TSH, T4 today Results to dictate further management  If in goal - will proceed with 6 month monitoring unless there are concerns       Relevant Orders   TSH   T4     Other   Hyperlipidemia LDL goal <70    Chronic, historic condition Appears to be well managed with  current regimen comprised of Rosuvastatin 40 mg PO every day, and fish oil  Continue current regimen Recheck lipid panel today Results to dictate further management  Follow up as needed for persistent or progressing symptoms        Relevant Medications   amLODipine (NORVASC) 10 MG tablet   Other Relevant Orders   Lipid Profile   Pre-diabetes    Chronic, ongoing Most recent A1c was 6.1 % Recheck today  Results to dictate further management  Continue with lifestyle efforts for now  Follow up in 6 months or sooner if concerns arise        Relevant Orders   HgB A1c   Other Visit Diagnoses     Need for influenza vaccination       Relevant Orders   Flu Vaccine Trivalent High Dose (Fluad) (Completed)   Screening for prostate cancer       Relevant Orders   PSA        Return in about 3 months (around 06/21/2023) for HLD, HTN, prediabetes, hypothyroidism .   I, Shawndra Clute E Iyauna Sing, PA-C, have reviewed all documentation for this visit. The documentation on 03/21/23 for the exam, diagnosis, procedures, and orders are all accurate and complete.   Jacquelin Hawking, MHS, PA-C [facility]

## 2023-03-21 NOTE — Telephone Encounter (Signed)
Received blood thinner advice from your PCP Danelle Berry.  She has advised for patient to stop his Aspirin 325mg  (7) days prior to your Colonoscopy and restart (1) day prior to your procedure.   Colonoscopy Date 04/12/23 Stop Aspirin Date 04/05/23 Restart on 04/13/23.  Voice message has been left advising patient of this and mychart message has been sent.  Asked patient to call me back to confirm he has receive voice message.  Thanks,  [place], [place]

## 2023-03-21 NOTE — Patient Instructions (Signed)
  Please try to wear compression stockings. Use your shoe size and then measure around the widest part of your calf to get the appropriate size stocking I typically recommend 15-25 mmHg compression force for daily wear. Please put these on first thing in the morning and wear until the evening time.  These should not be painful or cut into your legs but they may be pretty snug. If they cause pain, please take them off.

## 2023-03-22 LAB — HEMOGLOBIN A1C
Hgb A1c MFr Bld: 6.2 %{Hb} — ABNORMAL HIGH (ref <5.7)
Mean Plasma Glucose: 131 mg/dL
eAG (mmol/L): 7.3 mmol/L

## 2023-03-22 LAB — CBC WITH DIFFERENTIAL/PLATELET
Absolute Lymphocytes: 1373 {cells}/uL (ref 850–3900)
Absolute Monocytes: 363 {cells}/uL (ref 200–950)
Basophils Absolute: 59 {cells}/uL (ref 0–200)
Basophils Relative: 1.5 %
Eosinophils Absolute: 191 {cells}/uL (ref 15–500)
Eosinophils Relative: 4.9 %
HCT: 42.6 % (ref 38.5–50.0)
Hemoglobin: 13.7 g/dL (ref 13.2–17.1)
MCH: 28.2 pg (ref 27.0–33.0)
MCHC: 32.2 g/dL (ref 32.0–36.0)
MCV: 87.8 fL (ref 80.0–100.0)
MPV: 11.7 fL (ref 7.5–12.5)
Monocytes Relative: 9.3 %
Neutro Abs: 1915 {cells}/uL (ref 1500–7800)
Neutrophils Relative %: 49.1 %
Platelets: 221 10*3/uL (ref 140–400)
RBC: 4.85 10*6/uL (ref 4.20–5.80)
RDW: 13.1 % (ref 11.0–15.0)
Total Lymphocyte: 35.2 %
WBC: 3.9 10*3/uL (ref 3.8–10.8)

## 2023-03-22 LAB — LIPID PANEL
Cholesterol: 162 mg/dL (ref <200)
HDL: 58 mg/dL (ref >=40)
LDL Cholesterol (Calc): 91 mg/dL
Non-HDL Cholesterol (Calc): 104 mg/dL (ref <130)
Total CHOL/HDL Ratio: 2.8 (calc) (ref <5.0)
Triglycerides: 51 mg/dL (ref <150)

## 2023-03-22 LAB — TSH: TSH: 2.5 m[IU]/L (ref 0.40–4.50)

## 2023-03-22 LAB — PSA: PSA: 5.94 ng/mL — ABNORMAL HIGH (ref <=4.00)

## 2023-03-22 LAB — COMPLETE METABOLIC PANEL WITH GFR
AG Ratio: 1.5 (calc) (ref 1.0–2.5)
ALT: 23 U/L (ref 9–46)
AST: 19 U/L (ref 10–35)
Albumin: 4.5 g/dL (ref 3.6–5.1)
Alkaline phosphatase (APISO): 74 U/L (ref 35–144)
BUN: 11 mg/dL (ref 7–25)
CO2: 30 mmol/L (ref 20–32)
Calcium: 9.5 mg/dL (ref 8.6–10.3)
Chloride: 103 mmol/L (ref 98–110)
Creat: 1.02 mg/dL (ref 0.70–1.35)
Globulin: 3 g/dL (ref 1.9–3.7)
Glucose, Bld: 91 mg/dL (ref 65–99)
Potassium: 4.4 mmol/L (ref 3.5–5.3)
Sodium: 139 mmol/L (ref 135–146)
Total Bilirubin: 0.5 mg/dL (ref 0.2–1.2)
Total Protein: 7.5 g/dL (ref 6.1–8.1)
eGFR: 80 mL/min/{1.73_m2} (ref >=60)

## 2023-03-22 LAB — T4: T4, Total: 8.8 ug/dL (ref 4.9–10.5)

## 2023-03-22 NOTE — Progress Notes (Signed)
Your labs are back Your A1c was 6.2 which is overall stable and still in prediabetic range Your thyroid testing was in normal range Your electrolytes, liver and kidney function are overall in normal ranges Your CBC was overall normal, no signs of anemia Your cholesterol looks great Your prostate testing was elevated.  I recommend that we recheck this in 2 weeks and if it remains elevated we should refer you to urology for further evaluation to rule out potential prostate lesions or cancer.  I have placed the order to repeat your prostate testing please come by the office in about 2 weeks for a lab visit to complete this.  Please let us know if you have further questions or concerns

## 2023-03-22 NOTE — Addendum Note (Signed)
Addended by: Jacquelin Hawking on: 03/22/2023 03:53 PM   Modules accepted: Orders

## 2023-03-24 ENCOUNTER — Telehealth: Payer: Self-pay

## 2023-03-24 NOTE — Telephone Encounter (Signed)
Patient contacted office to make sure we were aware of his surgery history.  He had hernia surgery unsure what year it was.  Also had hydrocele surgery with Dr Apolinar Junes in 01/18/2018.  Informed him I would make note of this.  Thanks,  [place], [place]

## 2023-04-04 ENCOUNTER — Telehealth: Payer: Self-pay

## 2023-04-04 NOTE — Telephone Encounter (Signed)
Patient stated that he was just not up to having his colonoscopy next week due to somethings he has going on right now.  Colonoscopy has been rescheduled from 04/12/23 to 05/04/23.  Trish in Endo notified.  Thanks,  [place], [place]

## 2023-04-05 ENCOUNTER — Other Ambulatory Visit: Payer: Self-pay | Admitting: Family Medicine

## 2023-04-05 DIAGNOSIS — I10 Essential (primary) hypertension: Secondary | ICD-10-CM

## 2023-04-28 ENCOUNTER — Telehealth: Payer: Self-pay

## 2023-04-28 MED ORDER — GOLYTELY 236 G PO SOLR: 4000.0000 mL | Freq: Once | ORAL | 0 refills | Status: AC

## 2023-04-28 NOTE — Telephone Encounter (Signed)
Patient contacted office to request a cheaper prep to be sent to pharmacy.  Call returned-Golytely prep has been sent to pharmacy patient instructions reviewed for prep change to Golytely.  Asked patient to call back if he has any further questions.  He has stopped Aspirin 325mg .  Thanks,  [place], [place]

## 2023-05-02 ENCOUNTER — Telehealth: Payer: Self-pay

## 2023-05-02 NOTE — Telephone Encounter (Signed)
Patient contacted office this morning stated that the pharmacy informed him that his prep was going to be $51.  I replied advising him that this was the cost for the Suprep but not the most recent prep-the most recent prep would cost under $25.   He said that it's just not a good time for him to have his colonoscopy financially and personally.  I told him I understand, however I would encourage him to get his colonoscopy done sooner rather than later because he did have a positive cologuard.  He agreed and said he will call back next year when things are better in his life.  Trish in Endo notified.  Thanks,  [place], [place]

## 2023-05-04 ENCOUNTER — Ambulatory Visit: Admission: RE | Admit: 2023-05-04 | Payer: Medicare Other | Source: Home / Self Care | Admitting: Gastroenterology

## 2023-05-04 ENCOUNTER — Other Ambulatory Visit: Payer: Self-pay | Admitting: Family Medicine

## 2023-05-04 DIAGNOSIS — I6523 Occlusion and stenosis of bilateral carotid arteries: Secondary | ICD-10-CM

## 2023-05-04 DIAGNOSIS — I700 Atherosclerosis of aorta: Secondary | ICD-10-CM

## 2023-05-04 DIAGNOSIS — E785 Hyperlipidemia, unspecified: Secondary | ICD-10-CM

## 2023-05-04 SURGERY — COLONOSCOPY WITH PROPOFOL
Anesthesia: General

## 2023-05-12 ENCOUNTER — Encounter: Payer: Medicare Other | Admitting: Physical Medicine & Rehabilitation

## 2023-05-13 ENCOUNTER — Other Ambulatory Visit: Payer: Self-pay | Admitting: Family Medicine

## 2023-05-13 DIAGNOSIS — I10 Essential (primary) hypertension: Secondary | ICD-10-CM

## 2023-06-14 ENCOUNTER — Encounter: Payer: Self-pay | Admitting: Physical Medicine & Rehabilitation

## 2023-06-14 ENCOUNTER — Encounter: Payer: Medicare Other | Attending: Physical Medicine & Rehabilitation | Admitting: Physical Medicine & Rehabilitation

## 2023-06-14 VITALS — BP 187/94 | HR 73 | Ht 71.5 in | Wt 215.0 lb

## 2023-06-14 DIAGNOSIS — G8110 Spastic hemiplegia affecting unspecified side: Secondary | ICD-10-CM | POA: Diagnosis not present

## 2023-06-14 MED ORDER — ONABOTULINUMTOXINA 100 UNITS IJ SOLR
400.0000 [IU] | Freq: Once | INTRAMUSCULAR | Status: AC
  Administered 2023-06-14: 400 [IU] via INTRAMUSCULAR

## 2023-06-14 NOTE — Patient Instructions (Signed)

## 2023-06-14 NOTE — Progress Notes (Signed)
Botox Injection for spasticity using needle EMG guidance  Dilution: 50 Units/ml Indication: Severe spasticity which interferes with ADL,mobility and/or  hygiene and is unresponsive to medication management and other conservative care Informed consent was obtained after describing risks and benefits of the procedure with the patient. This includes bleeding, bruising, infection, excessive weakness, or medication side effects. A REMS form is on file and signed. Last botulinum toxin injection was performed 07/26/22 Needle: 27g 1" needle electrode Number of units per muscle RIGHT Biceps100 3+ Brachialis 50 3+  Brachioradialis 50U  3+ FCR50 2+ FCU 50 3+ FDS 25 2+ FDP50 2+ PT25 3+  All injections were done after obtaining appropriate EMG activity and after negative drawback for blood. The patient tolerated the procedure well. Post procedure instructions were given. A followup appointment was made.

## 2023-06-21 ENCOUNTER — Ambulatory Visit: Payer: Medicare Other | Admitting: Family Medicine

## 2023-07-10 ENCOUNTER — Other Ambulatory Visit: Payer: Self-pay | Admitting: Family Medicine

## 2023-07-10 DIAGNOSIS — E785 Hyperlipidemia, unspecified: Secondary | ICD-10-CM

## 2023-07-10 DIAGNOSIS — I6523 Occlusion and stenosis of bilateral carotid arteries: Secondary | ICD-10-CM

## 2023-07-10 DIAGNOSIS — I700 Atherosclerosis of aorta: Secondary | ICD-10-CM

## 2023-07-12 ENCOUNTER — Other Ambulatory Visit: Payer: Self-pay | Admitting: Family Medicine

## 2023-07-12 DIAGNOSIS — I10 Essential (primary) hypertension: Secondary | ICD-10-CM

## 2023-07-12 DIAGNOSIS — G8110 Spastic hemiplegia affecting unspecified side: Secondary | ICD-10-CM

## 2023-07-25 ENCOUNTER — Ambulatory Visit: Payer: Self-pay | Admitting: Family Medicine

## 2023-07-25 DIAGNOSIS — I10 Essential (primary) hypertension: Secondary | ICD-10-CM

## 2023-07-25 DIAGNOSIS — E785 Hyperlipidemia, unspecified: Secondary | ICD-10-CM

## 2023-07-25 DIAGNOSIS — I6523 Occlusion and stenosis of bilateral carotid arteries: Secondary | ICD-10-CM

## 2023-07-25 DIAGNOSIS — I700 Atherosclerosis of aorta: Secondary | ICD-10-CM

## 2023-07-27 ENCOUNTER — Other Ambulatory Visit: Payer: Self-pay | Admitting: Family Medicine

## 2023-07-27 DIAGNOSIS — G8110 Spastic hemiplegia affecting unspecified side: Secondary | ICD-10-CM

## 2023-07-27 DIAGNOSIS — I6523 Occlusion and stenosis of bilateral carotid arteries: Secondary | ICD-10-CM

## 2023-07-27 DIAGNOSIS — E785 Hyperlipidemia, unspecified: Secondary | ICD-10-CM

## 2023-07-27 DIAGNOSIS — I10 Essential (primary) hypertension: Secondary | ICD-10-CM

## 2023-07-27 DIAGNOSIS — I700 Atherosclerosis of aorta: Secondary | ICD-10-CM

## 2023-08-05 ENCOUNTER — Ambulatory Visit: Admitting: Family Medicine

## 2023-08-11 ENCOUNTER — Other Ambulatory Visit: Payer: Self-pay | Admitting: Physician Assistant

## 2023-08-11 DIAGNOSIS — I10 Essential (primary) hypertension: Secondary | ICD-10-CM

## 2023-08-12 NOTE — Telephone Encounter (Signed)
 1 year supply not appropriate at this time. Requested Prescriptions  Pending Prescriptions Disp Refills   amLODipine (NORVASC) 10 MG tablet [Pharmacy Med Name: amLODIPine Besylate 10 MG Oral Tablet] 100 tablet 2    Sig: TAKE 1 TABLET BY MOUTH DAILY     Cardiovascular: Calcium Channel Blockers 2 Failed - 08/12/2023  3:53 PM      Failed - Last BP in normal range    BP Readings from Last 1 Encounters:  06/14/23 (!) 187/94         Passed - Last Heart Rate in normal range    Pulse Readings from Last 1 Encounters:  06/14/23 73         Passed - Valid encounter within last 6 months    Recent Outpatient Visits           4 months ago Hypertension goal BP (blood pressure) < 140/90    [facility] Mecum, Erin E, PA-C   7 months ago Hyperlipidemia LDL goal <70   [facility], Sheliah Mends, PA-C   1 year ago Hypertension goal BP (blood pressure) < 140/90   [facility] Danelle Berry, PA-C   1 year ago No-show for appointment   [facility] Mecum, Oswaldo Conroy, PA-C   1 year ago Hypertension goal BP (blood pressure) < 140/90   [facility] Danelle Berry, [place]

## 2023-08-17 ENCOUNTER — Ambulatory Visit: Admitting: Family Medicine

## 2023-08-18 ENCOUNTER — Ambulatory Visit (INDEPENDENT_AMBULATORY_CARE_PROVIDER_SITE_OTHER): Payer: Medicare Other

## 2023-08-18 DIAGNOSIS — Z1211 Encounter for screening for malignant neoplasm of colon: Secondary | ICD-10-CM

## 2023-08-18 DIAGNOSIS — Z0000 Encounter for general adult medical examination without abnormal findings: Secondary | ICD-10-CM | POA: Diagnosis not present

## 2023-08-18 NOTE — Progress Notes (Signed)
 Subjective:   [name] is a 68 y.o. who presents for a Medicare Wellness preventive visit.  Visit Complete: Virtual I connected with  [name] on 08/18/23 by a audio enabled telemedicine application and verified that I am speaking with the correct person using two identifiers.  Patient Location: Home  Provider Location: Office/Clinic  I discussed the limitations of evaluation and management by telemedicine. The patient expressed understanding and agreed to proceed.  Vital Signs: Because this visit was a virtual/telehealth visit, some criteria may be missing or patient reported. Any vitals not documented were not able to be obtained and vitals that have been documented are patient reported.  VideoDeclined- This patient declined Librarian, academic. Therefore the visit was completed with audio only.  Persons Participating in Visit: Patient.  AWV Questionnaire: No: Patient Medicare AWV questionnaire was not completed prior to this visit.  Cardiac Risk Factors include: advanced age (>56men, >94 women);dyslipidemia;male gender;hypertension     Objective:    Today's Vitals   08/18/23 1515  PainSc: 3    There is no height or weight on file to calculate BMI.     08/18/2023    3:19 PM 08/12/2022    2:41 PM 08/06/2021    3:32 PM 07/01/2020    2:57 PM 05/10/2020    1:23 PM 04/06/2019   11:01 AM 01/18/2018    9:54 AM  Advanced Directives  Does Patient Have a Medical Advance Directive? No Yes Yes No No Yes No  Type of Best boy of [place];Living will   Healthcare Power of [place];Living will   Copy of Healthcare Power of Attorney in Chart?   No - copy requested   No - copy requested   Would patient like information on creating a medical advance directive? No - Patient declined   Yes (MAU/Ambulatory/Procedural Areas - Information given) No - Patient declined  No - Patient declined    Current Medications  (verified) Outpatient Encounter Medications as of 08/18/2023  Medication Sig   acetaminophen (TYLENOL) 500 MG tablet Take 1,000 mg by mouth daily as needed for moderate pain.   amLODipine (NORVASC) 10 MG tablet Take 1 tablet (10 mg total) by mouth daily.   aspirin EC 325 MG tablet TAKE 1 TABLET (325 MG TOTAL) BY MOUTH DAILY.   Baclofen 5 MG TABS TAKE 1/2 TO 1 TABLET BY MOUTH 3  TIMES DAILY AS NEEDED FOR PAIN  OR SPASMS   carvedilol (COREG) 6.25 MG tablet TAKE 1 TABLET BY MOUTH TWICE  DAILY WITH MEALS   cloNIDine (CATAPRES) 0.1 MG tablet TAKE 1 TABLET BY MOUTH DAILY   losartan (COZAAR) 100 MG tablet TAKE 1 TABLET BY MOUTH DAILY   meloxicam (MOBIC) 15 MG tablet TAKE 1/2 TO 1 TABLET BY MOUTH  DAILY AS NEEDED FOR PAIN   Multiple Vitamins-Minerals (ONE-A-DAY MENS HEALTH FORMULA) TABS Take 1 tablet by mouth daily.   Naphazoline-Hypromellose (TGT LUBRICANT REDNESS RELIEVER OP) Place 1 drop into both eyes daily as needed (redness/ dryness).   Omega-3 Fatty Acids (FISH OIL) 1000 MG CAPS Take 1,000 mg by mouth daily.   rosuvastatin (CRESTOR) 40 MG tablet TAKE 1 TABLET BY MOUTH AT  BEDTIME FOR CHOLESTEROL   No facility-administered encounter medications on file as of 08/18/2023.    Allergies (verified) Patient has no allergy information on record.   History: Past Medical History:  Diagnosis Date   AAA (abdominal aortic aneurysm) (HCC)    Bowel obstruction (HCC) 2016  during septic event. 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 dr. Cherylann Ratel follows him once a year   Hyperlipidemia    Hypertension    Hypothyroidism    Inguinal hernia    Mild atherosclerosis of carotid artery 12/2017   bilateral stenosis per vascular surgeon   Pink eye 06/21/2016   S/P Botox injection    every 3 months into right arm to loosen up muscles   Sepsis (HCC) 2016   happened  after stroke. unsure when,but kidney disease started. followed by dr. Cherylann Ratel   Shoulder subluxation, right 04/23/2014   Stroke (HCC) 12/24/2013   right side weakness   Past Surgical History:  Procedure Laterality Date   HERNIA REPAIR  12/23/2015   HYDROCELE EXCISION Left 01/18/2018   Procedure: HYDROCELECTOMY ADULT;  Surgeon: Vanna Scotland, MD;  Location: [facility] ORS;  Service: Urology;  Laterality: Left;   INGUINAL HERNIA REPAIR Bilateral 06/25/2016   Procedure: HERNIA REPAIR INGUINAL ADULT BILATERAL;  Surgeon: Kieth Brightly, MD;  Location: [facility] ORS;  Service: General;  Laterality: Bilateral;   KNEE ARTHROSCOPY Right 1970's   Family History  Problem Relation Age of Onset   Hypertension Mother    Hernia Sister    Prostate cancer Neg Hx    Bladder Cancer Neg Hx    Kidney cancer Neg Hx    Social History   Socioeconomic History   Marital status: Married    Spouse name: rosamund   Number of children: 0   Years of education: Not on file   Highest education level: 12th grade  Occupational History   Not on file  Tobacco Use   Smoking status: Never   Smokeless tobacco: Never  Vaping Use   Vaping status: Never Used  Substance and Sexual Activity   Alcohol use: No    Alcohol/week: 0.0 standard drinks of alcohol   Drug use: No    Comment: used to use THC in the late 1980s   Sexual activity: Not Currently  Other Topics Concern   Not on file  Social History Narrative   Not on file   Social Drivers of Health   Financial Resource Strain: Low Risk  (08/18/2023)   Overall Financial Resource Strain (CARDIA)    Difficulty of Paying Living Expenses: Not very hard  Recent Concern: Financial Resource Strain - Medium Risk (08/14/2023)   Overall Financial Resource Strain (CARDIA)    Difficulty of Paying Living Expenses: Somewhat hard  Food Insecurity: No Food Insecurity (08/18/2023)   Hunger Vital Sign    Worried About Running Out of Food in the Last Year: Never true    Ran Out of  Food in the Last Year: Never true  Recent Concern: Food Insecurity - Food Insecurity Present (08/14/2023)   Hunger Vital Sign    Worried About Running Out of Food in the Last Year: Sometimes true    Ran Out of Food in the Last Year: Sometimes true  Transportation Needs: No Transportation Needs (08/18/2023)   PRAPARE - Administrator, Civil Service (Medical): No    Lack of Transportation (Non-Medical): No  Recent Concern: Transportation Needs - Unmet Transportation Needs (08/14/2023)   PRAPARE - Transportation    Lack of Transportation (Medical): Yes    Lack of Transportation (Non-Medical): Yes  Physical Activity: Insufficiently Active (08/18/2023)   Exercise Vital Sign    Days  of Exercise per Week: 5 days    Minutes of Exercise per Session: 20 min  Stress: No Stress Concern Present (08/18/2023)   [employer] of Occupational Health - Occupational Stress Questionnaire    Feeling of Stress : Only a little  Social Connections: Moderately Integrated (08/18/2023)   Social Connection and Isolation Panel [NHANES]    Frequency of Communication with Friends and Family: Three times a week    Frequency of Social Gatherings with Friends and Family: Once a week    Attends Religious Services: More than 4 times per year    Active Member of [employer] or Organizations: No    Attends Engineer, structural: Never    Marital Status: Married    Tobacco Counseling Counseling given: Not Answered    Clinical Intake:  Pre-visit preparation completed: Yes  Pain : 0-10 Pain Score: 3  Pain Type: Chronic pain Pain Location: Leg Pain Orientation: Right, Left Pain Descriptors / Indicators: Aching, Dull Pain Onset: More than a month ago Pain Frequency: Constant     BMI - recorded: 29.6 Nutritional Status: BMI 25 -29 Overweight Nutritional Risks: None Diabetes: No  Lab Results  Component Value Date   HGBA1C 6.2 (H) 03/21/2023   HGBA1C 6.1 (H) 12/17/2022   HGBA1C 6.2 (H)  05/25/2022     How often do you need to have someone help you when you read instructions, pamphlets, or other written materials from your doctor or pharmacy?: 1 - Never  Interpreter Needed?: No  Information entered by :: Kennedy Bucker, LPN   Activities of Daily Living     08/18/2023    3:21 PM 08/14/2023    8:32 AM  In your present state of health, do you have any difficulty performing the following activities:  Hearing? 0 0  Vision? 0 0  Difficulty concentrating or making decisions? 0 0  Walking or climbing stairs? 1 1  Dressing or bathing? 0 0  Doing errands, shopping? 0 0  Preparing Food and eating ? N N  Using the Toilet? N N  In the past six months, have you accidently leaked urine? N N  Do you have problems with loss of bowel control? N N  Managing your Medications? N N  Managing your Finances? N N  Housekeeping or managing your Housekeeping? N N    Patient Care Team: Danelle Berry, PA-C as PCP - General (Family Medicine) Runell Gess, MD as Consulting Physician (Cardiology) Vanna Scotland, MD as Consulting Physician (Urology) Wynn Banker Victorino Sparrow, MD as Consulting Physician (Physical Medicine and Rehabilitation) Pa, [facility])  Indicate any recent Medical Services you may have received from other than [facility] providers in the past year (date may be approximate).     Assessment:   This is a routine wellness examination for [name].  Hearing/Vision screen Hearing Screening - Comments:: NO AIDS Vision Screening - Comments:: WEARS GLASSES FOR DRIVING, TV- [facility]   Goals Addressed             This Visit's Progress    DIET - EAT MORE FRUITS AND VEGETABLES         Depression Screen     08/18/2023    3:17 PM 03/21/2023    1:41 PM 02/10/2023   10:49 AM 12/17/2022    1:39 PM 08/12/2022    2:40 PM 05/25/2022    1:50 PM 01/26/2022   11:00 AM  PHQ 2/9 Scores  PHQ - 2 Score 1 1 0 0 0 0 0  PHQ- 9 Score 1 1  0  0     Fall Risk      08/18/2023    3:21 PM 08/14/2023    8:32 AM 06/14/2023   10:40 AM 03/21/2023    1:40 PM 02/10/2023   10:49 AM  Fall Risk   Falls in the past year? 0 0 0 0 0  Number falls in past yr: 0 0  0 0  Injury with Fall? 0 0  0 0  Risk for fall due to : No Fall Risks   No Fall Risks   Follow up Falls prevention discussed;Falls evaluation completed   Falls prevention discussed;Education provided;Falls evaluation completed     MEDICARE RISK AT HOME:  Medicare Risk at Home Any stairs in or around the home?: Yes If so, are there any without handrails?: No Home free of loose throw rugs in walkways, pet beds, electrical cords, etc?: Yes Adequate lighting in your home to reduce risk of falls?: Yes Life alert?: No Use of a cane, walker or w/c?: Yes (CANE SOMETIMES FOR LONG WALKS) Grab bars in the bathroom?: Yes Shower chair or bench in shower?: Yes Elevated toilet seat or a handicapped toilet?: Yes  TIMED UP AND GO:  Was the test performed?  No  Cognitive Function: 6CIT completed        08/18/2023    3:22 PM 08/12/2022    2:44 PM 04/06/2019   11:04 AM  6CIT Screen  What Year? 0 points 0 points 0 points  What month? 0 points 0 points 0 points  What time? 0 points 0 points 0 points  Count back from 20 0 points 0 points 0 points  Months in reverse 0 points 0 points 0 points  Repeat phrase 0 points 0 points 0 points  Total Score 0 points 0 points 0 points    Immunizations Immunization History  Administered Date(s) Administered   Fluad Quad(high Dose 65+) 02/25/2020, 04/10/2021, 05/25/2022   Fluad Trivalent(High Dose 65+) 03/21/2023   Influenza,inj,Quad PF,6+ Mos 02/18/2015, 03/09/2017, 03/10/2018, 03/14/2019   PFIZER(Purple Top)SARS-COV-2 Vaccination 08/23/2019, 09/17/2019, 04/05/2020   PNEUMOCOCCAL CONJUGATE-20 12/17/2022   Tdap 07/21/2015   Zoster Recombinant(Shingrix) 12/24/2022    Screening Tests Health Maintenance  Topic Date Due   COVID-19 Vaccine (4 - 2024-25 season)  01/23/2023   Zoster Vaccines- Shingrix (2 of 2) 10/19/2023 (Originally 02/18/2023)   Medicare Annual Wellness (AWV)  08/17/2024   DTaP/Tdap/Td (2 - Td or Tdap) 07/20/2025   Fecal DNA (Cologuard)  03/03/2026   Pneumonia Vaccine 85+ Years old  Completed   INFLUENZA VACCINE  Completed   Hepatitis C Screening  Completed   HPV VACCINES  Aged Out    Health Maintenance  Health Maintenance Due  Topic Date Due   COVID-19 Vaccine (4 - 2024-25 season) 01/23/2023   Health Maintenance Items Addressed: Referral sent to GI for colonoscopy UP TO DATE ON SHOTS EXCEPT NEEDS 2ND [facility]  Additional Screening:  Vision Screening: Recommended annual ophthalmology exams for early detection of glaucoma and other disorders of the eye.  Dental Screening: Recommended annual dental exams for proper oral hygiene  Community Resource Referral / Chronic Care Management: CRR required this visit?  No   CCM required this visit?  No     Plan:     I have personally reviewed and noted the following in the patient's chart:   Medical and social history Use of alcohol, tobacco or illicit drugs  Current medications and supplements including opioid prescriptions. Patient is not  currently taking opioid prescriptions. Functional ability and status Nutritional status Physical activity Advanced directives List of other physicians Hospitalizations, surgeries, and ER visits in previous 12 months Vitals Screenings to include cognitive, depression, and falls Referrals and appointments  In addition, I have reviewed and discussed with patient certain preventive protocols, quality metrics, and best practice recommendations. A written personalized care plan for preventive services as well as general preventive health recommendations were provided to patient.     Hal Hope, LPN   1/61/0960   After Visit Summary: (MyChart) Due to this being a telephonic visit, the after visit summary with patients personalized  plan was offered to patient via MyChart   Notes:  REFERRAL SENT FOR COLONOSCOPY

## 2023-08-18 NOTE — Patient Instructions (Addendum)
 [name] , Thank you for taking time to come for your Medicare Wellness Visit. I appreciate your ongoing commitment to your health goals. Please review the following plan we discussed and let me know if I can assist you in the future.   Referrals/Orders/Follow-Ups/Clinician Recommendations: REFERRAL FOR COLONOSCOPY SENT  This is a list of the screening recommended for you and due dates:  Health Maintenance  Topic Date Due   COVID-19 Vaccine (4 - 2024-25 season) 01/23/2023   Zoster (Shingles) Vaccine (2 of 2) 10/19/2023*   Medicare Annual Wellness Visit  08/17/2024   DTaP/Tdap/Td vaccine (2 - Td or Tdap) 07/20/2025   Cologuard (Stool DNA test)  03/03/2026   Pneumonia Vaccine  Completed   Flu Shot  Completed   Hepatitis C Screening  Completed   HPV Vaccine  Aged Out  *Topic was postponed. The date shown is not the original due date.    Advanced directives: (ACP Link)Information on Advanced Care Planning can be found at [facility] Advance Health Care Directives Advance Health Care Directives. [email or web address]   Next Medicare Annual Wellness Visit scheduled for next year: Yes   08/24/24 @ 11:30 AM BY PHONE

## 2023-08-22 ENCOUNTER — Ambulatory Visit: Admitting: Internal Medicine

## 2023-09-01 ENCOUNTER — Encounter: Payer: Self-pay | Admitting: *Deleted

## 2023-09-13 ENCOUNTER — Encounter: Payer: Medicare Other | Admitting: Physical Medicine & Rehabilitation

## 2023-09-27 ENCOUNTER — Encounter: Admitting: Physical Medicine & Rehabilitation

## 2023-10-26 ENCOUNTER — Other Ambulatory Visit: Payer: Self-pay | Admitting: Physician Assistant

## 2023-10-26 DIAGNOSIS — I10 Essential (primary) hypertension: Secondary | ICD-10-CM

## 2023-10-28 NOTE — Telephone Encounter (Signed)
 Courtesy refill. Patient will need an office visit for additional refills.  Requested Prescriptions  Pending Prescriptions Disp Refills   amLODipine  (NORVASC ) 10 MG tablet [Pharmacy Med Name: amLODIPine  Besylate 10 MG Oral Tablet] 30 tablet 0    Sig: TAKE 1 TABLET BY MOUTH DAILY     Cardiovascular: Calcium  Channel Blockers 2 Failed - 10/28/2023  8:51 AM      Failed - Last BP in normal range    BP Readings from Last 1 Encounters:  06/14/23 (!) 187/94         Passed - Last Heart Rate in normal range    Pulse Readings from Last 1 Encounters:  06/14/23 73         Passed - Valid encounter within last 6 months    Recent Outpatient Visits   None

## 2023-11-18 ENCOUNTER — Encounter: Attending: Physical Medicine & Rehabilitation | Admitting: Physical Medicine & Rehabilitation

## 2023-11-18 ENCOUNTER — Encounter: Payer: Self-pay | Admitting: Physical Medicine & Rehabilitation

## 2023-11-18 VITALS — BP 142/82 | HR 60 | Ht 71.0 in | Wt 213.0 lb

## 2023-11-18 DIAGNOSIS — G8110 Spastic hemiplegia affecting unspecified side: Secondary | ICD-10-CM | POA: Diagnosis not present

## 2023-11-18 MED ORDER — ONABOTULINUMTOXINA 100 UNITS IJ SOLR
400.0000 [IU] | Freq: Once | INTRAMUSCULAR | Status: AC
  Administered 2023-11-18: 400 [IU] via INTRAMUSCULAR

## 2023-11-18 MED ORDER — SODIUM CHLORIDE (PF) 0.9 % IJ SOLN
10.0000 mL | Freq: Once | INTRAMUSCULAR | Status: AC
  Administered 2023-11-18: 10 mL

## 2023-11-18 NOTE — Progress Notes (Signed)
 Botox Injection for spasticity using needle EMG guidance  Dilution: 50 Units/ml Indication: Severe spasticity which interferes with ADL,mobility and/or  hygiene and is unresponsive to medication management and other conservative care Informed consent was obtained after describing risks and benefits of the procedure with the patient. This includes bleeding, bruising, infection, excessive weakness, or medication side effects. A REMS form is on file and signed. Last botulinum toxin injection was performed 07/26/22 Needle: 27g 1" needle electrode Number of units per muscle RIGHT Biceps100 3+ Brachialis 50 3+  Brachioradialis 50U  3+ FCR50 2+ FCU 50 3+ FDS 25 2+ FDP50 2+ PT25 3+  All injections were done after obtaining appropriate EMG activity and after negative drawback for blood. The patient tolerated the procedure well. Post procedure instructions were given. A followup appointment was made.

## 2023-11-18 NOTE — Patient Instructions (Signed)
 You received a Xeomin injection today. You may experience soreness at the needle injection sites. Please call us if any of the injection sites turns red after a couple days or if there is any drainage. You may experience muscle weakness as a result of Xeomin This would improve with time but can take several weeks to improve. The Xeomin should start working in about one week. The Xeomin usually last 3 months. The injection can be repeated every 3 months as needed.

## 2023-11-19 ENCOUNTER — Other Ambulatory Visit: Payer: Self-pay | Admitting: Family Medicine

## 2023-11-19 DIAGNOSIS — I10 Essential (primary) hypertension: Secondary | ICD-10-CM

## 2023-11-22 NOTE — Telephone Encounter (Signed)
 Requested medications are due for refill today.  yes  Requested medications are on the active medications list.  yes  Last refill. 10/28/2023 #30 0 rf  Future visit scheduled.   no  Notes to clinic.  Pt last seen 03/21/2023 - Pt already given a courtesy refill.    Requested Prescriptions  Pending Prescriptions Disp Refills   amLODipine  (NORVASC ) 10 MG tablet [Pharmacy Med Name: amLODIPine  Besylate 10 MG Oral Tablet] 30 tablet 11    Sig: TAKE 1 TABLET BY MOUTH DAILY     Cardiovascular: Calcium  Channel Blockers 2 Failed - 11/22/2023  9:42 AM      Failed - Last BP in normal range    BP Readings from Last 1 Encounters:  11/18/23 (!) 142/82         Passed - Last Heart Rate in normal range    Pulse Readings from Last 1 Encounters:  11/18/23 60         Passed - Valid encounter within last 6 months    Recent Outpatient Visits   None

## 2023-11-22 NOTE — Telephone Encounter (Signed)
 Needs appt for office visit.

## 2024-01-02 ENCOUNTER — Other Ambulatory Visit: Payer: Self-pay | Admitting: Family Medicine

## 2024-01-02 DIAGNOSIS — I10 Essential (primary) hypertension: Secondary | ICD-10-CM

## 2024-01-05 NOTE — Telephone Encounter (Signed)
 Requested Prescriptions  Pending Prescriptions Disp Refills   losartan (COZAAR) 100 MG tablet [Pharmacy Med Name: Losartan Potassium 100 MG Oral Tablet] 100 tablet 0    Sig: TAKE 1 TABLET BY MOUTH DAILY     Cardiovascular:  Angiotensin Receptor Blockers Failed - 01/05/2024  2:20 PM      Failed - Cr in normal range and within 180 days    Creat  Date Value Ref Range Status  03/21/2023 1.02 0.70 - 1.35 mg/dL Final   Creatinine, Urine  Date Value Ref Range Status  05/14/2016 126 mg/dL Final         Failed - K in normal range and within 180 days    Potassium  Date Value Ref Range Status  03/21/2023 4.4 3.5 - 5.3 mmol/L Final  03/07/2014 3.6 3.5 - 5.1 mmol/L Final         Failed - Last BP in normal range    BP Readings from Last 1 Encounters:  11/18/23 (!) 142/82         Passed - Patient is not pregnant      Passed - Valid encounter within last 6 months    Recent Outpatient Visits   None

## 2024-02-21 ENCOUNTER — Encounter: Attending: Physical Medicine & Rehabilitation | Admitting: Physical Medicine & Rehabilitation

## 2024-02-21 ENCOUNTER — Encounter: Payer: Self-pay | Admitting: Physical Medicine & Rehabilitation

## 2024-02-21 VITALS — BP 158/78 | HR 63 | Ht 71.0 in | Wt 211.4 lb

## 2024-02-21 DIAGNOSIS — G8110 Spastic hemiplegia affecting unspecified side: Secondary | ICD-10-CM | POA: Diagnosis not present

## 2024-02-21 MED ORDER — INCOBOTULINUMTOXINA 100 UNITS IM SOLR
400.0000 [IU] | Freq: Once | INTRAMUSCULAR | Status: AC
  Administered 2024-02-21: 400 [IU] via INTRAMUSCULAR

## 2024-02-21 MED ORDER — SODIUM CHLORIDE (PF) 0.9 % IJ SOLN
8.0000 mL | Freq: Once | INTRAMUSCULAR | Status: AC
  Administered 2024-02-21: 8 mL

## 2024-02-21 NOTE — Patient Instructions (Signed)

## 2024-02-21 NOTE — Progress Notes (Signed)
 Xeomin   Injection for spasticity using needle EMG guidance  Dilution: 50 Units/ml Indication: Severe spasticity which interferes with ADL,mobility and/or  hygiene and is unresponsive to medication management and other conservative care Informed consent was obtained after describing risks and benefits of the procedure with the patient. This includes bleeding, bruising, infection, excessive weakness, or medication side effects. A REMS form is on file and signed. Last botulinum toxin injection was performed 07/26/22 Needle: 27g 1 needle electrode Number of units per muscle RIGHT Biceps100 3+ Brachialis 50 3+  Brachioradialis 50U  3+ FCR50 2+ FCU 50 3+ FDS 25 2+ FDP50 2+ PT25 3+  All injections were done after obtaining appropriate EMG activity and after negative drawback for blood. The patient tolerated the procedure well. Post procedure instructions were given. A followup appointment was made.

## 2024-02-23 ENCOUNTER — Encounter: Payer: Self-pay | Admitting: Family Medicine

## 2024-02-23 ENCOUNTER — Ambulatory Visit: Admitting: Family Medicine

## 2024-02-23 VITALS — BP 132/76 | HR 66 | Resp 16 | Ht 71.0 in | Wt 212.0 lb

## 2024-02-23 DIAGNOSIS — G8110 Spastic hemiplegia affecting unspecified side: Secondary | ICD-10-CM | POA: Diagnosis not present

## 2024-02-23 DIAGNOSIS — E039 Hypothyroidism, unspecified: Secondary | ICD-10-CM | POA: Diagnosis not present

## 2024-02-23 DIAGNOSIS — I10 Essential (primary) hypertension: Secondary | ICD-10-CM

## 2024-02-23 DIAGNOSIS — Z23 Encounter for immunization: Secondary | ICD-10-CM | POA: Diagnosis not present

## 2024-02-23 DIAGNOSIS — I6523 Occlusion and stenosis of bilateral carotid arteries: Secondary | ICD-10-CM | POA: Diagnosis not present

## 2024-02-23 DIAGNOSIS — E785 Hyperlipidemia, unspecified: Secondary | ICD-10-CM | POA: Diagnosis not present

## 2024-02-23 DIAGNOSIS — R7303 Prediabetes: Secondary | ICD-10-CM | POA: Diagnosis not present

## 2024-02-23 DIAGNOSIS — R972 Elevated prostate specific antigen [PSA]: Secondary | ICD-10-CM | POA: Insufficient documentation

## 2024-02-23 MED ORDER — SHINGRIX 50 MCG/0.5ML IM SUSR: 0.5000 mL | Freq: Once | INTRAMUSCULAR | 0 refills | Status: AC

## 2024-02-23 MED ORDER — BACLOFEN 5 MG PO TABS: ORAL_TABLET | ORAL | 3 refills | Status: AC

## 2024-02-23 NOTE — Assessment & Plan Note (Signed)
 Managed by PM&R Refills today on his baclofen 

## 2024-02-23 NOTE — Assessment & Plan Note (Signed)
 Lab Results  Component Value Date   HGBA1C 6.2 (H) 03/21/2023   Not on meds, recheck

## 2024-02-23 NOTE — Assessment & Plan Note (Signed)
 Hyperlipidemia Cholesterol levels were last checked a year ago. He is currently on rosuvastatin  with an adequate supply of medication. - Order lipid panel to assess current cholesterol levels. - Continue rosuvastatin  as prescribed. Lab Results  Component Value Date   CHOL 162 03/21/2023   HDL 58 03/21/2023   LDLCALC 91 03/21/2023   TRIG 51 03/21/2023   CHOLHDL 2.8 03/21/2023

## 2024-02-23 NOTE — Progress Notes (Signed)
 Name: YUMA PACELLA   MRN: [id number]    DOB: 1956/01/25   Date:02/23/2024       Progress Note  Chief Complaint  Patient presents with   Medical Management of Chronic Issues   Hypertension   Hyperlipidemia   Prediabetes     Subjective:   [name] is a 68 y.o. male, presents to clinic for routine follow up on chronic conditions  Hx of hypothyroid not on meds Lab Results  Component Value Date   TSH 2.50 03/21/2023   Hyperlipidemia: Currently treated with crestor , pt reports good med compliance Last Lipids: Lab Results  Component Value Date   CHOL 162 03/21/2023   HDL 58 03/21/2023   LDLCALC 91 03/21/2023   TRIG 51 03/21/2023   CHOLHDL 2.8 03/21/2023   - Denies: Chest pain, shortness of breath, myalgias, claudication  Hypertension:  Currently managed on amlodipine , losartan  coreg  Pt reports good med compliance and denies any SE.   Blood pressure today is near goal/well controlled - often elevated at specialists appt and when doing injections at PM&R, at home BP are <130/80 he states BP Readings from Last 3 Encounters:  02/23/24 132/76  02/21/24 (!) 158/78  11/18/23 (!) 142/82   Pt denies CP, SOB, exertional sx, LE edema, palpitation, Ha's, visual disturbances, lightheadedness, hypotension, syncope.    High PSA last year  Lab Results  Component Value Date   PSA 5.94 (H) 03/21/2023    IPSS     Row Name 02/23/24 0936         International Prostate Symptom Score   How often have you had the sensation of not emptying your bladder? Not at All     How often have you had to urinate less than every two hours? Not at All     How often have you found you stopped and started again several times when you urinated? Not at All     How often have you found it difficult to postpone urination? Not at All     How often have you had a weak urinary stream? Not at All     How often have you had to strain to start urination? Not at All     How many times did you  typically get up at night to urinate? None     Total IPSS Score 0       No urinary sx or concerns, states he hasn't seen urology  Spastic hemiplegia managed by PM&R needs refill on baclofen  usually uses at bedtime  Discussed the use of AI scribe software for clinical note transcription with the patient, who gave verbal consent to proceed.  History of Present Illness [name] is a 68 year old male with hypertension who presents for a follow-up visit.  Hypertension - Blood pressure readings at home are well controlled, with a recent measurement of 139/77 mmHg - Blood pressure tends to be elevated during medical procedures - Currently taking losartan , amlodipine , and carvedilol  for blood pressure management  Musculoskeletal symptoms and sleep - Uses a muscle relaxer at a dose of 5 mg around 10 PM before bedtime - Muscle relaxer improves sleep quality - Requests a refill of muscle relaxer  Lower urinary tract symptoms and prostate health - No difficulty initiating urination, dribbling, or sensation of incomplete bladder emptying - Increased urination after coffee consumption - Nocturia absent - Drinks large amounts of water - PSA was elevated last year - No current prostate-related symptoms - Has not  seen a urologist  Thyroid  function - Not taking thyroid  medication - Thyroid  function was evaluated last year          Current Outpatient Medications:    acetaminophen  (TYLENOL ) 500 MG tablet, Take 1,000 mg by mouth daily as needed for moderate pain., Disp: , Rfl:    amLODipine  (NORVASC ) 10 MG tablet, TAKE 1 TABLET BY MOUTH DAILY, Disp: 30 tablet, Rfl: 0   aspirin  EC 325 MG tablet, TAKE 1 TABLET (325 MG TOTAL) BY MOUTH DAILY., Disp: 90 tablet, Rfl: 3   Baclofen  5 MG TABS, TAKE 1/2 TO 1 TABLET BY MOUTH 3  TIMES DAILY AS NEEDED FOR PAIN  OR SPASMS, Disp: 180 tablet, Rfl: 1   carvedilol  (COREG ) 6.25 MG tablet, TAKE 1 TABLET BY MOUTH TWICE  DAILY WITH MEALS, Disp: 180  tablet, Rfl: 3   cloNIDine  (CATAPRES ) 0.1 MG tablet, TAKE 1 TABLET BY MOUTH DAILY, Disp: 90 tablet, Rfl: 3   losartan  (COZAAR ) 100 MG tablet, TAKE 1 TABLET BY MOUTH DAILY, Disp: 100 tablet, Rfl: 0   meloxicam  (MOBIC ) 15 MG tablet, TAKE 1/2 TO 1 TABLET BY MOUTH  DAILY AS NEEDED FOR PAIN, Disp: 100 tablet, Rfl: 2   Multiple Vitamins-Minerals (ONE-A-DAY MENS HEALTH FORMULA) TABS, Take 1 tablet by mouth daily., Disp: , Rfl:    Naphazoline-Hypromellose (TGT LUBRICANT REDNESS RELIEVER OP), Place 1 drop into both eyes daily as needed (redness/ dryness)., Disp: , Rfl:    Omega-3 Fatty Acids (FISH OIL) 1000 MG CAPS, Take 1,000 mg by mouth daily., Disp: , Rfl:    rosuvastatin  (CRESTOR ) 40 MG tablet, TAKE 1 TABLET BY MOUTH AT  BEDTIME FOR CHOLESTEROL, Disp: 90 tablet, Rfl: 3   Zoster Vaccine Adjuvanted (SHINGRIX ) injection, Inject 0.5 mLs into the muscle once for 1 dose., Disp: 0.5 mL, Rfl: 0  Patient Active Problem List   Diagnosis Date Noted   Hypothyroidism 03/09/2017   Abdominal aortic atherosclerosis 05/20/2016   Pre-diabetes 02/18/2015   Atherosclerosis of both carotid arteries 02/18/2015   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   Spastic hemiplegia affecting dominant side (HCC) 02/18/2014   Hypertension goal BP (blood pressure) < 140/90 01/17/2014   Hyperlipidemia LDL goal <70 01/17/2014    Past Surgical History:  Procedure Laterality Date   HERNIA REPAIR  12/23/2015   HYDROCELE EXCISION Left 01/18/2018   Procedure: HYDROCELECTOMY ADULT;  Surgeon: Penne Knee, MD;  Location: [facility];  Service: Urology;  Laterality: Left;   INGUINAL HERNIA REPAIR Bilateral 06/25/2016   Procedure: HERNIA REPAIR INGUINAL ADULT BILATERAL;  Surgeon: Louanne KANDICE Muse, MD;  Location: [facility];  Service: General;  Laterality: Bilateral;   KNEE ARTHROSCOPY Right 1970's    Family History  Problem Relation Age of Onset   Hypertension Mother    Hernia Sister    Prostate cancer Neg Hx    Bladder  Cancer Neg Hx    Kidney cancer Neg Hx     Social History   Tobacco Use   Smoking status: Never   Smokeless tobacco: Never  Vaping Use   Vaping status: Never Used  Substance Use Topics   Alcohol use: No    Alcohol/week: 0.0 standard drinks of alcohol   Drug use: No    Comment: used to use THC in the late 1980s     No Known Allergies  Health Maintenance  Topic Date Due   COVID-19 Vaccine (4 - 2025-26 season) 03/10/2024 (Originally 01/23/2024)   Zoster Vaccines- Shingrix  (2 of 2) 05/25/2024 (Originally 02/18/2023)  Medicare Annual Wellness (AWV)  08/17/2024   DTaP/Tdap/Td (2 - Td or Tdap) 07/20/2025   Fecal DNA (Cologuard)  03/03/2026   Pneumococcal Vaccine: 50+ Years  Completed   Influenza Vaccine  Completed   Hepatitis C Screening  Completed   HPV VACCINES  Aged Out   Meningococcal B Vaccine  Aged Out    Chart Review Today: I personally reviewed active problem list, medication list, allergies, family history, social history, health maintenance, notes from last encounter, lab results, imaging with the patient/caregiver today.   Review of Systems  Constitutional: Negative.   HENT: Negative.    Eyes: Negative.   Respiratory: Negative.    Cardiovascular: Negative.   Gastrointestinal: Negative.   Endocrine: Negative.   Genitourinary: Negative.   Musculoskeletal: Negative.   Skin: Negative.   Allergic/Immunologic: Negative.   Neurological: Negative.   Hematological: Negative.   Psychiatric/Behavioral: Negative.    All other systems reviewed and are negative.    Objective:   Vitals:   02/23/24 0907  BP: 132/76  Pulse: 66  Resp: 16  SpO2: 99%  Weight: 212 lb (96.2 kg)  Height: 5' 11 (1.803 m)    Body mass index is 29.57 kg/m.  Physical Exam Vitals and nursing note reviewed.  Constitutional:      General: He is not in acute distress.    Appearance: Normal appearance. He is normal weight. He is not ill-appearing, toxic-appearing or diaphoretic.  HENT:      Head: Normocephalic and atraumatic.     Right Ear: External ear normal.     Left Ear: External ear normal.     Mouth/Throat:     Pharynx: Oropharynx is clear.  Eyes:     General: No scleral icterus.       Right eye: No discharge.        Left eye: No discharge.     Conjunctiva/sclera: Conjunctivae normal.  Cardiovascular:     Rate and Rhythm: Normal rate and regular rhythm.     Pulses: Normal pulses.     Heart sounds: Normal heart sounds.  Pulmonary:     Effort: Pulmonary effort is normal. No respiratory distress.     Breath sounds: Normal breath sounds. No stridor. No wheezing, rhonchi or rales.  Abdominal:     General: Bowel sounds are normal. There is no distension.     Palpations: Abdomen is soft.     Tenderness: There is no abdominal tenderness.  Musculoskeletal:     Right lower leg: No edema (brace right LE).     Left lower leg: No edema.  Skin:    General: Skin is warm and dry.     Coloration: Skin is not jaundiced.  Neurological:     Mental Status: He is alert. Mental status is at baseline.     Gait: Gait abnormal.     Comments: Right UE hemiplegia Right LE hemiparesis   Psychiatric:        Attention and Perception: Attention normal.        Mood and Affect: Mood and affect normal.        Speech: Speech normal.        Behavior: Behavior is cooperative.        Thought Content: Thought content normal.      Functional Status Survey:   Results for orders placed or performed in visit on 03/21/23  TSH   Collection Time: 03/21/23  2:30 PM  Result Value Ref Range   TSH 2.50 0.40 - 4.50 mIU/L  T4   Collection Time: 03/21/23  2:30 PM  Result Value Ref Range   T4, Total 8.8 4.9 - 10.5 mcg/dL  COMPLETE METABOLIC PANEL WITH GFR   Collection Time: 03/21/23  2:30 PM  Result Value Ref Range   Glucose, Bld 91 65 - 99 mg/dL   BUN 11 7 - 25 mg/dL   Creat 8.97 9.29 - 8.64 mg/dL   eGFR 80 > OR = 60 fO/fpw/8.26f7   BUN/Creatinine Ratio SEE NOTE: 6 - 22 (calc)    Sodium 139 135 - 146 mmol/L   Potassium 4.4 3.5 - 5.3 mmol/L   Chloride 103 98 - 110 mmol/L   CO2 30 20 - 32 mmol/L   Calcium  9.5 8.6 - 10.3 mg/dL   Total Protein 7.5 6.1 - 8.1 g/dL   Albumin  4.5 3.6 - 5.1 g/dL   Globulin 3.0 1.9 - 3.7 g/dL (calc)   AG Ratio 1.5 1.0 - 2.5 (calc)   Total Bilirubin 0.5 0.2 - 1.2 mg/dL   Alkaline phosphatase (APISO) 74 35 - 144 U/L   AST 19 10 - 35 U/L   ALT 23 9 - 46 U/L  CBC w/Diff/Platelet   Collection Time: 03/21/23  2:30 PM  Result Value Ref Range   WBC 3.9 3.8 - 10.8 Thousand/uL   RBC 4.85 4.20 - 5.80 Million/uL   Hemoglobin 13.7 13.2 - 17.1 g/dL   HCT 57.3 61.4 - 49.9 %   MCV 87.8 80.0 - 100.0 fL   MCH 28.2 27.0 - 33.0 pg   MCHC 32.2 32.0 - 36.0 g/dL   RDW 86.8 88.9 - 84.9 %   Platelets 221 140 - 400 Thousand/uL   MPV 11.7 7.5 - 12.5 fL   Neutro Abs 1,915 1,500 - 7,800 cells/uL   Absolute Lymphocytes 1,373 850 - 3,900 cells/uL   Absolute Monocytes 363 200 - 950 cells/uL   Eosinophils Absolute 191 15 - 500 cells/uL   Basophils Absolute 59 0 - 200 cells/uL   Neutrophils Relative % 49.1 %   Total Lymphocyte 35.2 %   Monocytes Relative 9.3 %   Eosinophils Relative 4.9 %   Basophils Relative 1.5 %  Lipid Profile   Collection Time: 03/21/23  2:30 PM  Result Value Ref Range   Cholesterol 162 <200 mg/dL   HDL 58 > OR = 40 mg/dL   Triglycerides 51 <849 mg/dL   LDL Cholesterol (Calc) 91 mg/dL (calc)   Total CHOL/HDL Ratio 2.8 <5.0 (calc)   Non-HDL Cholesterol (Calc) 104 <130 mg/dL (calc)  HgB J8r   Collection Time: 03/21/23  2:30 PM  Result Value Ref Range   Hgb A1c MFr Bld 6.2 (H) <5.7 % of total Hgb   Mean Plasma Glucose 131 mg/dL   eAG (mmol/L) 7.3 mmol/L  PSA   Collection Time: 03/21/23  2:30 PM  Result Value Ref Range   PSA 5.94 (H) < OR = 4.00 ng/mL      Assessment & Plan:   Assessment & Plan  Hypertension goal BP (blood pressure) < 140/90 Assessment & Plan: Hypertension Blood pressure is well-controlled with home  readings generally below 130/80 mmHg, despite occasional spikes during procedures or in-office visits. - Continue current antihypertensive medications: losartan , amlodipine , and carvedilol . - Monitor blood pressure at home and report if average readings exceed 130/80 mmHg. BP Readings from Last 3 Encounters:  02/23/24 132/76  02/21/24 (!) 158/78  11/18/23 (!) 142/82     Orders: -     CBC with Differential/Platelet  Spastic hemiplegia affecting  dominant side (HCC) Assessment & Plan: Managed by PM&R Refills today on his baclofen   Orders: -     Baclofen ; TAKE 1/2 TO 1 TABLET BY MOUTH 3  TIMES DAILY AS NEEDED FOR PAIN  OR SPASMS  Dispense: 180 tablet; Refill: 3  Hyperlipidemia LDL goal <70 Assessment & Plan: Hyperlipidemia Cholesterol levels were last checked a year ago. He is currently on rosuvastatin  with an adequate supply of medication. - Order lipid panel to assess current cholesterol levels. - Continue rosuvastatin  as prescribed. Lab Results  Component Value Date   CHOL 162 03/21/2023   HDL 58 03/21/2023   LDLCALC 91 03/21/2023   TRIG 51 03/21/2023   CHOLHDL 2.8 03/21/2023     Orders: -     Lipid panel  Atherosclerosis of both carotid arteries -     CBC with Differential/Platelet  Hypothyroidism, unspecified type Assessment & Plan: Hypothyroidism No current thyroid  medication. Thyroid  function was checked last year, re-evaluation is warranted to ensure thyroid  function remains normal. - Order thyroid  function tests.  Orders: -     TSH  Pre-diabetes Assessment & Plan: Lab Results  Component Value Date   HGBA1C 6.2 (H) 03/21/2023   Not on meds, recheck   Orders: -     Hemoglobin A1c -     Comprehensive metabolic panel with GFR  Elevated PSA measurement Assessment & Plan: Elevated prostate specific antigen (PSA) PSA was elevated last year with no current urinary symptoms. The elevation could be due to benign prostatic hyperplasia, slow-growing  prostate cancer, or other non-cancerous causes. PSA is not a very good cancer marker and can be elevated due to non-cancerous causes. - Recheck PSA levels. - Consider referral to urology if PSA remains elevated or if urinary symptoms develop.  Orders: -     PSA  Immunization due -     Flu vaccine HIGH DOSE PF(Fluzone Trivalent) -     Shingrix ; Inject 0.5 mLs into the muscle once for 1 dose.  Dispense: 0.5 mL; Refill: 0     Recording duration: 8 minutes    Return in about 6 months (around 08/23/2024) for HTN, HLD, Prediabetes.   Michelene Cower, PA-C 02/23/24 9:23 AM

## 2024-02-23 NOTE — Assessment & Plan Note (Signed)
 Elevated prostate specific antigen (PSA) PSA was elevated last year with no current urinary symptoms. The elevation could be due to benign prostatic hyperplasia, slow-growing prostate cancer, or other non-cancerous causes. PSA is not a very good cancer marker and can be elevated due to non-cancerous causes. - Recheck PSA levels. - Consider referral to urology if PSA remains elevated or if urinary symptoms develop.

## 2024-02-23 NOTE — Assessment & Plan Note (Signed)
 Hypertension Blood pressure is well-controlled with home readings generally below 130/80 mmHg, despite occasional spikes during procedures or in-office visits. - Continue current antihypertensive medications: losartan , amlodipine , and carvedilol . - Monitor blood pressure at home and report if average readings exceed 130/80 mmHg. BP Readings from Last 3 Encounters:  02/23/24 132/76  02/21/24 (!) 158/78  11/18/23 (!) 142/82

## 2024-02-23 NOTE — Assessment & Plan Note (Signed)
 Hypothyroidism No current thyroid  medication. Thyroid  function was checked last year, re-evaluation is warranted to ensure thyroid  function remains normal. - Order thyroid  function tests.

## 2024-02-24 ENCOUNTER — Ambulatory Visit: Payer: Self-pay | Admitting: Family Medicine

## 2024-02-24 DIAGNOSIS — R972 Elevated prostate specific antigen [PSA]: Secondary | ICD-10-CM

## 2024-02-24 LAB — CBC WITH DIFFERENTIAL/PLATELET
Absolute Lymphocytes: 1083 {cells}/uL (ref 850–3900)
Absolute Monocytes: 331 {cells}/uL (ref 200–950)
Basophils Absolute: 30 {cells}/uL (ref 0–200)
Basophils Relative: 0.8 %
Eosinophils Absolute: 160 {cells}/uL (ref 15–500)
Eosinophils Relative: 4.2 %
HCT: 42.4 % (ref 38.5–50.0)
Hemoglobin: 13.6 g/dL (ref 13.2–17.1)
MCH: 28.3 pg (ref 27.0–33.0)
MCHC: 32.1 g/dL (ref 32.0–36.0)
MCV: 88.1 fL (ref 80.0–100.0)
MPV: 10.9 fL (ref 7.5–12.5)
Monocytes Relative: 8.7 %
Neutro Abs: 2196 {cells}/uL (ref 1500–7800)
Neutrophils Relative %: 57.8 %
Platelets: 220 Thousand/uL (ref 140–400)
RBC: 4.81 Million/uL (ref 4.20–5.80)
RDW: 12.8 % (ref 11.0–15.0)
Total Lymphocyte: 28.5 %
WBC: 3.8 Thousand/uL (ref 3.8–10.8)

## 2024-02-24 LAB — COMPREHENSIVE METABOLIC PANEL WITH GFR
AG Ratio: 1.5 (calc) (ref 1.0–2.5)
ALT: 18 U/L (ref 9–46)
AST: 14 U/L (ref 10–35)
Albumin: 4.3 g/dL (ref 3.6–5.1)
Alkaline phosphatase (APISO): 67 U/L (ref 35–144)
BUN: 15 mg/dL (ref 7–25)
CO2: 29 mmol/L (ref 20–32)
Calcium: 9.1 mg/dL (ref 8.6–10.3)
Chloride: 102 mmol/L (ref 98–110)
Creat: 1.04 mg/dL (ref 0.70–1.35)
Globulin: 2.8 g/dL (ref 1.9–3.7)
Glucose, Bld: 102 mg/dL — ABNORMAL HIGH (ref 65–99)
Potassium: 4.2 mmol/L (ref 3.5–5.3)
Sodium: 138 mmol/L (ref 135–146)
Total Bilirubin: 0.4 mg/dL (ref 0.2–1.2)
Total Protein: 7.1 g/dL (ref 6.1–8.1)
eGFR: 78 mL/min/1.73m2 (ref >=60)

## 2024-02-24 LAB — HEMOGLOBIN A1C
Hgb A1c MFr Bld: 6.1 % — ABNORMAL HIGH (ref <5.7)
Mean Plasma Glucose: 128 mg/dL
eAG (mmol/L): 7.1 mmol/L

## 2024-02-24 LAB — LIPID PANEL
Cholesterol: 139 mg/dL (ref <200)
HDL: 57 mg/dL (ref >=40)
LDL Cholesterol (Calc): 70 mg/dL
Non-HDL Cholesterol (Calc): 82 mg/dL (ref <130)
Total CHOL/HDL Ratio: 2.4 (calc) (ref <5.0)
Triglycerides: 43 mg/dL (ref <150)

## 2024-02-24 LAB — TSH: TSH: 2.49 m[IU]/L (ref 0.40–4.50)

## 2024-02-24 LAB — PSA: PSA: 8.02 ng/mL — ABNORMAL HIGH (ref <=4.00)

## 2024-03-05 ENCOUNTER — Encounter: Payer: Self-pay | Admitting: Urology

## 2024-03-20 DIAGNOSIS — H109 Unspecified conjunctivitis: Secondary | ICD-10-CM | POA: Diagnosis not present

## 2024-04-07 ENCOUNTER — Other Ambulatory Visit: Payer: Self-pay | Admitting: Family Medicine

## 2024-04-07 DIAGNOSIS — I6523 Occlusion and stenosis of bilateral carotid arteries: Secondary | ICD-10-CM

## 2024-04-07 DIAGNOSIS — I700 Atherosclerosis of aorta: Secondary | ICD-10-CM

## 2024-04-07 DIAGNOSIS — G8110 Spastic hemiplegia affecting unspecified side: Secondary | ICD-10-CM

## 2024-04-07 DIAGNOSIS — E785 Hyperlipidemia, unspecified: Secondary | ICD-10-CM

## 2024-04-07 DIAGNOSIS — I10 Essential (primary) hypertension: Secondary | ICD-10-CM

## 2024-04-10 NOTE — Telephone Encounter (Signed)
 Requested Prescriptions  Pending Prescriptions Disp Refills   losartan  (COZAAR ) 100 MG tablet [Pharmacy Med Name: Losartan  Potassium 100 MG Oral Tablet] 100 tablet 1    Sig: TAKE 1 TABLET BY MOUTH DAILY     Cardiovascular:  Angiotensin Receptor Blockers Passed - 04/10/2024  1:00 PM      Passed - Cr in normal range and within 180 days    Creat  Date Value Ref Range Status  02/23/2024 1.04 0.70 - 1.35 mg/dL Final   Creatinine, Urine  Date Value Ref Range Status  05/14/2016 126 mg/dL Final         Passed - K in normal range and within 180 days    Potassium  Date Value Ref Range Status  02/23/2024 4.2 3.5 - 5.3 mmol/L Final  03/07/2014 3.6 3.5 - 5.1 mmol/L Final         Passed - Patient is not pregnant      Passed - Last BP in normal range    BP Readings from Last 1 Encounters:  02/23/24 132/76         Passed - Valid encounter within last 6 months    Recent Outpatient Visits           1 month ago Hypertension goal BP (blood pressure) < 140/90   [facility], Leisa, PA-C               rosuvastatin  (CRESTOR ) 40 MG tablet [Pharmacy Med Name: Rosuvastatin  Calcium  40 MG Oral Tablet] 100 tablet 2    Sig: TAKE 1 TABLET BY MOUTH AT  BEDTIME FOR CHOLESTEROL     Cardiovascular:  Antilipid - Statins 2 Failed - 04/10/2024  1:00 PM      Failed - Lipid Panel in normal range within the last 12 months    Cholesterol, Total  Date Value Ref Range Status  03/11/2015 198 100 - 199 mg/dL Final   Cholesterol  Date Value Ref Range Status  02/23/2024 139 <200 mg/dL Final  91/95/7984 791 (H) 0 - 200 mg/dL Final   Ldl Cholesterol, Calc  Date Value Ref Range Status  12/25/2013 147 (H) 0 - 100 mg/dL Final   LDL Cholesterol (Calc)  Date Value Ref Range Status  02/23/2024 70 mg/dL (calc) Final    Comment:    Reference range: <100 . Desirable range <100 mg/dL for primary prevention;   <70 mg/dL for patients with CHD or diabetic patients  with > or =  2 CHD risk factors. SABRA LDL-C is now calculated using the Martin-Hopkins  calculation, which is a validated novel method providing  better accuracy than the Friedewald equation in the  estimation of LDL-C.  Gladis APPLETHWAITE et al. SANDREA. 7986;689(80): 2061-2068  (http://education.[facility].com/faq/FAQ164)    HDL Cholesterol  Date Value Ref Range Status  12/25/2013 49 40 - 60 mg/dL Final   HDL  Date Value Ref Range Status  02/23/2024 57 > OR = 40 mg/dL Final  89/81/7983 58 >60 mg/dL Final    Comment:    According to ATP-III Guidelines, HDL-C >59 mg/dL is considered a negative risk factor for CHD.    Triglycerides  Date Value Ref Range Status  02/23/2024 43 <150 mg/dL Final  91/95/7984 59 0 - 200 mg/dL Final         Passed - Cr in normal range and within 360 days    Creat  Date Value Ref Range Status  02/23/2024 1.04 0.70 - 1.35 mg/dL Final   Creatinine, Urine  Date Value Ref Range Status  05/14/2016 126 mg/dL Final         Passed - Patient is not pregnant      Passed - Valid encounter within last 12 months    Recent Outpatient Visits           1 month ago Hypertension goal BP (blood pressure) < 140/90   [facility] [place], Leisa, PA-C               meloxicam  (MOBIC ) 15 MG tablet [Pharmacy Med Name: Meloxicam  15 MG Oral Tablet] 100 tablet 2    Sig: TAKE 1/2 TO 1 TABLET BY MOUTH  DAILY AS NEEDED FOR PAIN     Analgesics:  COX2 Inhibitors Failed - 04/10/2024  1:00 PM      Failed - Manual Review: Labs are only required if the patient has taken medication for more than 8 weeks.      Passed - HGB in normal range and within 360 days    Hemoglobin  Date Value Ref Range Status  02/23/2024 13.6 13.2 - 17.1 g/dL Final  89/81/7983 86.2 12.6 - 17.7 g/dL Final         Passed - Cr in normal range and within 360 days    Creat  Date Value Ref Range Status  02/23/2024 1.04 0.70 - 1.35 mg/dL Final   Creatinine, Urine  Date Value Ref Range  Status  05/14/2016 126 mg/dL Final         Passed - HCT in normal range and within 360 days    HCT  Date Value Ref Range Status  02/23/2024 42.4 38.5 - 50.0 % Final   Hematocrit  Date Value Ref Range Status  03/11/2015 42.5 37.5 - 51.0 % Final         Passed - AST in normal range and within 360 days    AST  Date Value Ref Range Status  02/23/2024 14 10 - 35 U/L Final   SGOT(AST)  Date Value Ref Range Status  01/30/2014 16 15 - 37 Unit/L Final         Passed - ALT in normal range and within 360 days    ALT  Date Value Ref Range Status  02/23/2024 18 9 - 46 U/L Final   SGPT (ALT)  Date Value Ref Range Status  01/30/2014 37 U/L Final    Comment:    14-63 NOTE: New Reference Range 12/11/13          Passed - eGFR is 30 or above and within 360 days    GFR, Est African American  Date Value Ref Range Status  08/25/2020 95 > OR = 60 mL/min/1.84m2 Final   GFR, Est Non African American  Date Value Ref Range Status  08/25/2020 82 > OR = 60 mL/min/1.27m2 Final   eGFR  Date Value Ref Range Status  02/23/2024 78 > OR = 60 mL/min/1.6m2 Final         Passed - Patient is not pregnant      Passed - Valid encounter within last 12 months    Recent Outpatient Visits           1 month ago Hypertension goal BP (blood pressure) < 140/90   [facility] [place], Leisa, PA-C               carvedilol  (COREG ) 6.25 MG tablet [Pharmacy Med Name: Carvedilol  6.25 MG Oral Tablet] 200 tablet 2    Sig:  TAKE 1 TABLET BY MOUTH TWICE  DAILY WITH MEALS     Cardiovascular: Beta Blockers 3 Passed - 04/10/2024  1:00 PM      Passed - Cr in normal range and within 360 days    Creat  Date Value Ref Range Status  02/23/2024 1.04 0.70 - 1.35 mg/dL Final   Creatinine, Urine  Date Value Ref Range Status  05/14/2016 126 mg/dL Final         Passed - AST in normal range and within 360 days    AST  Date Value Ref Range Status  02/23/2024 14 10 - 35 U/L Final    SGOT(AST)  Date Value Ref Range Status  01/30/2014 16 15 - 37 Unit/L Final         Passed - ALT in normal range and within 360 days    ALT  Date Value Ref Range Status  02/23/2024 18 9 - 46 U/L Final   SGPT (ALT)  Date Value Ref Range Status  01/30/2014 37 U/L Final    Comment:    14-63 NOTE: New Reference Range 12/11/13          Passed - Last BP in normal range    BP Readings from Last 1 Encounters:  02/23/24 132/76         Passed - Last Heart Rate in normal range    Pulse Readings from Last 1 Encounters:  02/23/24 66         Passed - Valid encounter within last 6 months    Recent Outpatient Visits           1 month ago Hypertension goal BP (blood pressure) < 140/90   [facility] Leavy Mole, PA-C

## 2024-05-22 ENCOUNTER — Encounter: Payer: Self-pay | Admitting: Physical Medicine & Rehabilitation

## 2024-05-22 ENCOUNTER — Encounter: Attending: Physical Medicine & Rehabilitation | Admitting: Physical Medicine & Rehabilitation

## 2024-05-22 VITALS — BP 150/74 | HR 64 | Ht 71.0 in | Wt 216.0 lb

## 2024-05-22 DIAGNOSIS — G8111 Spastic hemiplegia affecting right dominant side: Secondary | ICD-10-CM

## 2024-05-22 DIAGNOSIS — G8110 Spastic hemiplegia affecting unspecified side: Secondary | ICD-10-CM | POA: Diagnosis not present

## 2024-05-22 MED ORDER — SODIUM CHLORIDE (PF) 0.9 % IJ SOLN
8.0000 mL | Freq: Once | INTRAMUSCULAR | Status: AC
  Administered 2024-05-22: 8 mL

## 2024-05-22 MED ORDER — ONABOTULINUMTOXINA 100 UNITS IJ SOLR
400.0000 [IU] | Freq: Once | INTRAMUSCULAR | Status: AC
  Administered 2024-05-22: 400 [IU] via INTRAMUSCULAR

## 2024-05-22 NOTE — Progress Notes (Signed)
 Botox Injection for spasticity using needle EMG guidance  Dilution: 50 Units/ml Indication: Severe spasticity which interferes with ADL,mobility and/or  hygiene and is unresponsive to medication management and other conservative care Informed consent was obtained after describing risks and benefits of the procedure with the patient. This includes bleeding, bruising, infection, excessive weakness, or medication side effects. A REMS form is on file and signed. Last botulinum toxin injection was performed 07/26/22 Needle: 27g 1" needle electrode Number of units per muscle RIGHT Biceps100 3+ Brachialis 50 3+  Brachioradialis 50U  3+ FCR50 2+ FCU 50 3+ FDS 25 2+ FDP50 2+ PT25 3+  All injections were done after obtaining appropriate EMG activity and after negative drawback for blood. The patient tolerated the procedure well. Post procedure instructions were given. A followup appointment was made.

## 2024-05-22 NOTE — Patient Instructions (Signed)

## 2024-06-20 ENCOUNTER — Telehealth: Payer: Self-pay | Admitting: Family Medicine

## 2024-06-20 DIAGNOSIS — I700 Atherosclerosis of aorta: Secondary | ICD-10-CM

## 2024-06-20 DIAGNOSIS — I6523 Occlusion and stenosis of bilateral carotid arteries: Secondary | ICD-10-CM

## 2024-06-20 DIAGNOSIS — E785 Hyperlipidemia, unspecified: Secondary | ICD-10-CM

## 2024-06-20 DIAGNOSIS — I10 Essential (primary) hypertension: Secondary | ICD-10-CM

## 2024-06-20 MED ORDER — ROSUVASTATIN CALCIUM 40 MG PO TABS: ORAL_TABLET | ORAL | 0 refills | Status: AC

## 2024-06-20 MED ORDER — CARVEDILOL 6.25 MG PO TABS: 6.2500 mg | ORAL_TABLET | Freq: Two times a day (BID) | ORAL | 0 refills | Status: AC

## 2024-06-20 NOTE — Telephone Encounter (Signed)
 [facility] faxed refill request for the following medications:  carvedilol  (COREG ) 6.25 MG tablet   rosuvastatin  (CRESTOR ) 40 MG tablet     Please advise.

## 2024-08-21 ENCOUNTER — Encounter: Admitting: Physical Medicine & Rehabilitation

## 2024-08-23 ENCOUNTER — Ambulatory Visit: Admitting: Family Medicine

## 2024-08-24 ENCOUNTER — Encounter
